# Patient Record
Sex: Female | Born: 1950 | Race: White | Hispanic: No | Marital: Married | Smoking: Former smoker
Health system: Southern US, Community
[De-identification: ages and names within clinical notes are randomized; demographics above are authoritative.]

## PROBLEM LIST (undated history)

## (undated) DIAGNOSIS — T8859XA Other complications of anesthesia, initial encounter: Secondary | ICD-10-CM

## (undated) DIAGNOSIS — R002 Palpitations: Secondary | ICD-10-CM

## (undated) DIAGNOSIS — F419 Anxiety disorder, unspecified: Secondary | ICD-10-CM

## (undated) DIAGNOSIS — M1990 Unspecified osteoarthritis, unspecified site: Secondary | ICD-10-CM

## (undated) DIAGNOSIS — Z720 Tobacco use: Secondary | ICD-10-CM

## (undated) DIAGNOSIS — J45909 Unspecified asthma, uncomplicated: Secondary | ICD-10-CM

## (undated) DIAGNOSIS — Z8489 Family history of other specified conditions: Secondary | ICD-10-CM

## (undated) DIAGNOSIS — I714 Abdominal aortic aneurysm, without rupture, unspecified: Secondary | ICD-10-CM

## (undated) DIAGNOSIS — Z9889 Other specified postprocedural states: Secondary | ICD-10-CM

## (undated) DIAGNOSIS — M545 Low back pain, unspecified: Secondary | ICD-10-CM

## (undated) DIAGNOSIS — K219 Gastro-esophageal reflux disease without esophagitis: Secondary | ICD-10-CM

## (undated) DIAGNOSIS — F329 Major depressive disorder, single episode, unspecified: Secondary | ICD-10-CM

## (undated) DIAGNOSIS — F32A Depression, unspecified: Secondary | ICD-10-CM

## (undated) DIAGNOSIS — I2510 Atherosclerotic heart disease of native coronary artery without angina pectoris: Secondary | ICD-10-CM

## (undated) DIAGNOSIS — R112 Nausea with vomiting, unspecified: Secondary | ICD-10-CM

## (undated) DIAGNOSIS — I214 Non-ST elevation (NSTEMI) myocardial infarction: Secondary | ICD-10-CM

## (undated) DIAGNOSIS — E785 Hyperlipidemia, unspecified: Secondary | ICD-10-CM

## (undated) DIAGNOSIS — N289 Disorder of kidney and ureter, unspecified: Secondary | ICD-10-CM

## (undated) DIAGNOSIS — J449 Chronic obstructive pulmonary disease, unspecified: Secondary | ICD-10-CM

## (undated) DIAGNOSIS — G8929 Other chronic pain: Secondary | ICD-10-CM

## (undated) DIAGNOSIS — I10 Essential (primary) hypertension: Secondary | ICD-10-CM

## (undated) DIAGNOSIS — T4145XA Adverse effect of unspecified anesthetic, initial encounter: Secondary | ICD-10-CM

## (undated) HISTORY — PX: STENT PLACE LEFT URETER (ARMC HX): HXRAD1254

## (undated) HISTORY — PX: CYST EXCISION: SHX5701

## (undated) HISTORY — PX: ABDOMINAL HYSTERECTOMY: SHX81

## (undated) HISTORY — PX: LAPAROSCOPIC NISSEN FUNDOPLICATION: SHX1932

## (undated) HISTORY — PX: CHOLECYSTECTOMY: SHX55

---

## 1999-02-16 ENCOUNTER — Other Ambulatory Visit: Admission: RE | Admit: 1999-02-16 | Discharge: 1999-02-16 | Payer: Self-pay | Admitting: Family Medicine

## 2003-02-14 ENCOUNTER — Emergency Department (HOSPITAL_COMMUNITY): Admission: EM | Admit: 2003-02-14 | Discharge: 2003-02-14 | Payer: Self-pay | Admitting: Emergency Medicine

## 2003-04-07 ENCOUNTER — Ambulatory Visit (HOSPITAL_COMMUNITY): Admission: RE | Admit: 2003-04-07 | Discharge: 2003-04-07 | Payer: Self-pay | Admitting: Gastroenterology

## 2003-07-30 ENCOUNTER — Ambulatory Visit (HOSPITAL_COMMUNITY): Admission: RE | Admit: 2003-07-30 | Discharge: 2003-07-30 | Payer: Self-pay | Admitting: Gastroenterology

## 2004-05-26 ENCOUNTER — Other Ambulatory Visit: Admission: RE | Admit: 2004-05-26 | Discharge: 2004-05-26 | Payer: Self-pay | Admitting: Family Medicine

## 2004-07-17 ENCOUNTER — Ambulatory Visit (HOSPITAL_COMMUNITY): Admission: RE | Admit: 2004-07-17 | Discharge: 2004-07-17 | Payer: Self-pay | Admitting: Gastroenterology

## 2004-11-15 ENCOUNTER — Inpatient Hospital Stay (HOSPITAL_COMMUNITY): Admission: RE | Admit: 2004-11-15 | Discharge: 2004-11-17 | Payer: Self-pay | Admitting: Surgery

## 2005-08-29 ENCOUNTER — Ambulatory Visit: Payer: Self-pay | Admitting: Cardiology

## 2008-04-18 ENCOUNTER — Ambulatory Visit: Payer: Self-pay | Admitting: Cardiology

## 2008-08-08 ENCOUNTER — Ambulatory Visit: Payer: Self-pay | Admitting: Cardiology

## 2010-10-27 NOTE — Op Note (Signed)
   NAME:  Olivia Wade, Olivia Wade                           ACCOUNT NO.:  [id number]   MEDICAL RECORD NO.:  [id number]                   PATIENT TYPE:  AMB   LOCATION:  ENDO                                 FACILITY:  [facility]   PHYSICIAN:  John C. Madilyn Fireman, M.D.                 DATE OF BIRTH:  [birth year]   DATE OF PROCEDURE:  04/07/2003  DATE OF DISCHARGE:                                 OPERATIVE REPORT   PROCEDURE:  Esophagogastroduodenoscopy with esophageal dilatation.   INDICATION FOR PROCEDURE:  Dysphagia for solids greater than liquids.   DESCRIPTION OF PROCEDURE:  The patient was placed in the left lateral  decubitus position and placed on the pulse monitor with continuous low-flow  oxygen delivered by nasal cannula.  She was sedated with 75 mcg IV fentanyl  and 6 mg IV Versed.  The Olympus video endoscope was advanced under direct  vision into the oropharynx and esophagus.  The esophagus was straight and of  normal caliber with the squamocolumnar line at 38 cm.  There was no visible  hiatal hernia, ring, stricture, or other abnormality of the GE junction.  The stomach was entered, and a small amount of liquid secretions were  suctioned from the fundus.  Retroflexed view of the cardia was unremarkable.  The fundus, body, antrum, and pylorus all appeared normal.  The duodenum was  entered, and both the bulb and second portion were well-inspected and  appeared to be within normal limits.  A Savary guidewire was passed through  the endoscope channel, and the scope was then withdrawn.  A single Savary  dilator of 17 mm was passed over the guidewire with mild resistance and no  blood seen on withdrawal.  The dilator was removed together with the wire,  and the patient was returned to the recovery room in stable condition.  She  tolerated the procedure well, and there were no immediate complications.   IMPRESSION:  Normal endoscopy, status post empiric dilatation due to  dysphagia.   PLAN:   Advance diet and observe response to dilatation.                                               John C. Madilyn Fireman, M.D.    JCH/MEDQ  D:  04/07/2003  T:  04/07/2003  Job:  [id number]   cc:   Magnus Sinning. Dimple Casey, M.D.  [address] [place]  [place] [address]  Fax: [phone]

## 2010-10-27 NOTE — Op Note (Signed)
Olivia Wade, Olivia Wade                 ACCOUNT NO.:  [id number]   MEDICAL RECORD NO.:  [id number]          PATIENT TYPE:  AMB   LOCATION:  ENDO                         FACILITY:  [facility]   PHYSICIAN:  John C. Madilyn Fireman, M.D.    DATE OF BIRTH:  1950-12-25   DATE OF PROCEDURE:  07/17/2004  DATE OF DISCHARGE:  07/17/2004                                 OPERATIVE REPORT   PROCEDURE:  Esophageal motility study.   ATTENDING:  Everardo All. Madilyn Fireman, M.D.   INDICATIONS FOR PROCEDURE:  Chronic reflux symptoms with some atypical  features and failing to respond to a proton pump inhibitor.   RESULTS:   I. LOWER ESOPHAGEAL SPHINCTER:  Resting pressure is 14.85 mmHg, which is  within normal limits.  Relaxation is 87%, which is within normal limits.   II. LOWER ESOPHAGEAL BODY STUDY:  Eleven of 12 administered wet swallows  resulted in peristaltic contractions of normal amplitude, velocity and  duration.  There was 1 of 12 nontransmural contractions.   IMPRESSION:  Normal esophageal motility study.   PLAN:  No contraindication to antireflux surgery.      JCH/MEDQ  D:  08/05/2004  T:  08/07/2004  Job:  [id number]

## 2010-10-27 NOTE — Op Note (Signed)
[name], [name]                 ACCOUNT NO.:  [id number]   MEDICAL RECORD NO.:  [id number]          PATIENT TYPE:  OBV   LOCATION:  1607                         FACILITY:  [facility]   PHYSICIAN:  Thornton Park. Daphine Deutscher, MD  DATE OF BIRTH:  Feb 21, 1951   DATE OF PROCEDURE:  11/15/2004  DATE OF DISCHARGE:                                 OPERATIVE REPORT   CCS NUMBER:  [id number]   PREOPERATIVE INDICATIONS:  Olivia Wade is a 60 year old lady from [place] with  asthma and documented gastroesophageal reflux disease. Most impressive is  her history of nocturnal reflux with food coming back in her throat.   PROCEDURE:  Laparoscopic Nissen fundoplication over a 50 lighted bougie.   SURGEON:  Luretha Murphy, MD.   ASSISTANT:  Lebron Conners, MD   ANESTHESIA:  General endotracheal.   DESCRIPTION OF PROCEDURE:  Olivia Wade was taken to room one on the  afternoon of November 15, 2004 and given general anesthesia. The abdomen was  prepped with Betadine and draped sterilely. I entered the abdomen through  the left upper quadrant with an Optiview 10/11 without difficulty. The  abdomen was insufflated. A 5 mm was placed  in the upper midline for the  articulating squiggly retractor. I used harmonic to take down some adhesions  in the right upper quadrant and then put in two trocars there and then one  to the left of the umbilicus for the camera.   I began the dissection along the lesser curvature and went down through the  pars flaccida. I opened up the region along the crus and did a  retroesophageal dissection and identified both crura. I put a stitch in to  approximate them although there was not much of a hiatal hernia present. I  then created a retroesophageal window and using the Penrose drain around the  esophagus used to aid the dissection. To took down short gastric vessels  with the harmonic scalpel. I then went around behind the esophagus, grasped  the stomach and brought it in the position for the  wrap.   Dr. Shireen Quan then passed a 50 lighted bougie and with that in place, I  secured a contiguous portion of the wrapped stomach from the left side to  suture all to the esophagogastric junction and to each other. These were  tied down, the first one extracorporeal and then two intracorporeal knots.  Clips were placed on the middle knot and top knot for identification  radiographically. They attempted to put an NG tube down before and were  unable to pass one. However, the lighted bougie went down easily without  difficulty.   I surveyed the abdominal contents and everything looked good. The wrap  appeared good and withdrew the retractors, decompressed the abdomen and  closed the wounds with staples. The patient seemed to tolerate the procedure  well and she was taken to the recovery room in satisfactory condition.       MBM/MEDQ  D:  11/15/2004  T:  11/16/2004  Job:  [id number]   cc:   Everardo All. Madilyn Fireman, M.D.  [phone]  [address]  [place]  [place] [address]  Fax: [phone]   Magnus Sinning. Dimple Casey, M.D.  [address] [place]  [place] [address]  Fax: [phone]

## 2010-10-27 NOTE — Op Note (Signed)
Olivia Wade, Olivia Wade                 ACCOUNT NO.:  [id number]   MEDICAL RECORD NO.:  [id number]          PATIENT TYPE:  AMB   LOCATION:  ENDO                         FACILITY:  [facility]   PHYSICIAN:  John C. Madilyn Fireman, M.D.    DATE OF BIRTH:  1951/02/13   DATE OF PROCEDURE:  07/17/2004  DATE OF DISCHARGE:  07/17/2004                                 OPERATIVE REPORT   PROCEDURE:  Esophageal 24-hour PH study.   INDICATIONS FOR PROCEDURE:  Gastroesophageal reflux with suboptimal response  to medical therapy.  Undergoing consideration for possible antireflux  surgery.   RESULTS:  Total Demeester reflux score was 39.8 with normal being less than  22.0.  Total reflux episodes were 181 with normal less than 50.  There was  fair symptoms correlation with 66.67% of heartburn episodes associated with  documented reflux, 100% of chest pain associated with reflux and 100% of  vomiting associated with reflux.   IMPRESSION:  Abnormal total reflux scores with fairly good symptoms  correlation.   PLAN:  No contraindications to anti-reflux surgery.  Will discuss this  option with the patient.      JCH/MEDQ  D:  08/05/2004  T:  08/07/2004  Job:  [id number]   cc:   Ernestina Penna, M.D.  [address] [place]  [place] [address]  Fax: [phone]

## 2011-04-27 ENCOUNTER — Other Ambulatory Visit: Payer: Self-pay | Admitting: Cardiovascular Disease

## 2011-04-27 DIAGNOSIS — M545 Low back pain: Secondary | ICD-10-CM

## 2011-05-01 ENCOUNTER — Ambulatory Visit
Admission: RE | Admit: 2011-05-01 | Discharge: 2011-05-01 | Disposition: A | Discharge status: Home / Self Care | Payer: BC Managed Care – PPO | Source: Ambulatory Visit | Attending: Cardiovascular Disease | Admitting: Cardiovascular Disease

## 2011-05-01 DIAGNOSIS — M545 Low back pain: Secondary | ICD-10-CM

## 2011-05-09 HISTORY — PX: OTHER SURGICAL HISTORY: SHX169

## 2011-05-11 ENCOUNTER — Other Ambulatory Visit (HOSPITAL_COMMUNITY): Payer: Self-pay | Admitting: Cardiovascular Disease

## 2011-05-11 DIAGNOSIS — N83209 Unspecified ovarian cyst, unspecified side: Secondary | ICD-10-CM

## 2011-05-11 DIAGNOSIS — R52 Pain, unspecified: Secondary | ICD-10-CM

## 2011-05-15 ENCOUNTER — Ambulatory Visit (HOSPITAL_COMMUNITY)
Admission: RE | Admit: 2011-05-15 | Discharge: 2011-05-15 | Disposition: A | Discharge status: Home / Self Care | Payer: BC Managed Care – PPO | Source: Ambulatory Visit | Attending: Cardiovascular Disease | Admitting: Cardiovascular Disease

## 2011-05-15 ENCOUNTER — Other Ambulatory Visit (HOSPITAL_COMMUNITY): Payer: Self-pay | Admitting: Cardiovascular Disease

## 2011-05-15 DIAGNOSIS — R52 Pain, unspecified: Secondary | ICD-10-CM

## 2011-05-15 DIAGNOSIS — N83209 Unspecified ovarian cyst, unspecified side: Secondary | ICD-10-CM

## 2011-05-15 DIAGNOSIS — R9389 Abnormal findings on diagnostic imaging of other specified body structures: Secondary | ICD-10-CM | POA: Insufficient documentation

## 2011-05-15 DIAGNOSIS — R109 Unspecified abdominal pain: Secondary | ICD-10-CM | POA: Insufficient documentation

## 2012-04-24 ENCOUNTER — Other Ambulatory Visit (HOSPITAL_COMMUNITY): Payer: Self-pay | Admitting: Cardiovascular Disease

## 2012-04-24 DIAGNOSIS — I714 Abdominal aortic aneurysm, without rupture: Secondary | ICD-10-CM

## 2012-04-24 DIAGNOSIS — I6529 Occlusion and stenosis of unspecified carotid artery: Secondary | ICD-10-CM

## 2012-05-19 ENCOUNTER — Ambulatory Visit (HOSPITAL_COMMUNITY)
Admission: RE | Admit: 2012-05-19 | Discharge: 2012-05-19 | Disposition: A | Discharge status: Home / Self Care | Payer: BC Managed Care – PPO | Source: Ambulatory Visit | Attending: Cardiovascular Disease | Admitting: Cardiovascular Disease

## 2012-05-19 DIAGNOSIS — I714 Abdominal aortic aneurysm, without rupture: Secondary | ICD-10-CM

## 2012-05-19 DIAGNOSIS — I6529 Occlusion and stenosis of unspecified carotid artery: Secondary | ICD-10-CM | POA: Insufficient documentation

## 2012-05-19 HISTORY — PX: OTHER SURGICAL HISTORY: SHX169

## 2012-05-19 NOTE — Progress Notes (Signed)
Carotid Doppler completed. ,Chauncy Lean

## 2012-05-19 NOTE — Progress Notes (Signed)
Aorta Duplex Completed. Bethania Schlotzhauer D  

## 2012-11-26 ENCOUNTER — Other Ambulatory Visit (HOSPITAL_COMMUNITY): Payer: Self-pay | Admitting: Cardiovascular Disease

## 2012-11-26 ENCOUNTER — Encounter: Payer: Self-pay | Admitting: Cardiovascular Disease

## 2012-11-26 DIAGNOSIS — I10 Essential (primary) hypertension: Secondary | ICD-10-CM

## 2012-11-26 DIAGNOSIS — R011 Cardiac murmur, unspecified: Secondary | ICD-10-CM

## 2012-11-27 ENCOUNTER — Other Ambulatory Visit (HOSPITAL_COMMUNITY): Payer: Self-pay | Admitting: Cardiovascular Disease

## 2012-11-27 DIAGNOSIS — R011 Cardiac murmur, unspecified: Secondary | ICD-10-CM

## 2012-11-27 DIAGNOSIS — I10 Essential (primary) hypertension: Secondary | ICD-10-CM

## 2012-12-09 HISTORY — PX: OTHER SURGICAL HISTORY: SHX169

## 2012-12-15 ENCOUNTER — Inpatient Hospital Stay (HOSPITAL_COMMUNITY): Admission: RE | Admit: 2012-12-15 | Payer: BC Managed Care – PPO | Source: Ambulatory Visit

## 2012-12-19 ENCOUNTER — Other Ambulatory Visit: Payer: Self-pay | Admitting: Cardiovascular Disease

## 2012-12-19 LAB — CBC WITH DIFFERENTIAL/PLATELET
Basophils Relative: 0 % (ref 0–1)
Eosinophils Absolute: 0.1 10*3/uL (ref 0.0–0.7)
Eosinophils Relative: 1 % (ref 0–5)
Lymphs Abs: 3.6 10*3/uL (ref 0.7–4.0)
MCH: 32.1 pg (ref 26.0–34.0)
MCHC: 35.3 g/dL (ref 30.0–36.0)
MCV: 90.8 fL (ref 78.0–100.0)
Monocytes Relative: 8 % (ref 3–12)
Platelets: 320 10*3/uL (ref 150–400)
RBC: 3.9 MIL/uL (ref 3.87–5.11)

## 2012-12-20 LAB — COMPREHENSIVE METABOLIC PANEL
BUN: 8 mg/dL (ref 6–23)
CO2: 26 mEq/L (ref 19–32)
Calcium: 9.1 mg/dL (ref 8.4–10.5)
Chloride: 93 mEq/L — ABNORMAL LOW (ref 96–112)
Creat: 0.97 mg/dL (ref 0.50–1.10)

## 2012-12-31 ENCOUNTER — Other Ambulatory Visit: Payer: Self-pay | Admitting: Cardiovascular Disease

## 2012-12-31 ENCOUNTER — Ambulatory Visit (HOSPITAL_COMMUNITY)
Admission: RE | Admit: 2012-12-31 | Discharge: 2012-12-31 | Disposition: A | Discharge status: Home / Self Care | Payer: BC Managed Care – PPO | Source: Ambulatory Visit | Attending: Cardiovascular Disease | Admitting: Cardiovascular Disease

## 2012-12-31 DIAGNOSIS — I10 Essential (primary) hypertension: Secondary | ICD-10-CM | POA: Insufficient documentation

## 2012-12-31 DIAGNOSIS — R011 Cardiac murmur, unspecified: Secondary | ICD-10-CM

## 2012-12-31 HISTORY — PX: DOPPLER ECHOCARDIOGRAPHY: SHX263

## 2012-12-31 NOTE — Progress Notes (Signed)
*  PRELIMINARY RESULTS* Echocardiogram 2D Echocardiogram has been performed.  Olivia Wade 12/31/2012, 3:13 PM

## 2013-01-01 LAB — COMPREHENSIVE METABOLIC PANEL
ALT: 11 U/L (ref 0–35)
Albumin: 4.3 g/dL (ref 3.5–5.2)
CO2: 26 mEq/L (ref 19–32)
Calcium: 9.6 mg/dL (ref 8.4–10.5)
Chloride: 98 mEq/L (ref 96–112)
Potassium: 4.7 mEq/L (ref 3.5–5.3)
Sodium: 131 mEq/L — ABNORMAL LOW (ref 135–145)
Total Protein: 7 g/dL (ref 6.0–8.3)

## 2013-01-14 ENCOUNTER — Other Ambulatory Visit: Payer: Self-pay

## 2013-01-14 MED ORDER — ATORVASTATIN CALCIUM 40 MG PO TABS: 40.0000 mg | ORAL_TABLET | Freq: Every day | ORAL | Status: DC

## 2013-01-14 NOTE — Telephone Encounter (Signed)
Rx was sent to pharmacy electronically. 

## 2013-01-15 ENCOUNTER — Other Ambulatory Visit: Payer: Self-pay | Admitting: Cardiovascular Disease

## 2013-01-15 LAB — BASIC METABOLIC PANEL
Calcium: 8.8 mg/dL (ref 8.4–10.5)
Glucose, Bld: 74 mg/dL (ref 70–99)
Sodium: 139 mEq/L (ref 135–145)

## 2013-04-01 ENCOUNTER — Other Ambulatory Visit: Payer: Self-pay | Admitting: *Deleted

## 2013-04-01 NOTE — Telephone Encounter (Signed)
Medication refill for tramadol 50mg  q8h prn refused with reasoning of prescriber not at practice, defer refills to PCP

## 2013-05-26 ENCOUNTER — Encounter: Payer: Self-pay | Admitting: Cardiovascular Disease

## 2013-05-26 ENCOUNTER — Ambulatory Visit (INDEPENDENT_AMBULATORY_CARE_PROVIDER_SITE_OTHER): Payer: BC Managed Care – PPO | Admitting: Cardiovascular Disease

## 2013-05-26 VITALS — BP 106/60 | HR 82 | Ht 64.0 in | Wt 144.3 lb

## 2013-05-26 DIAGNOSIS — I714 Abdominal aortic aneurysm, without rupture, unspecified: Secondary | ICD-10-CM

## 2013-05-26 DIAGNOSIS — F17200 Nicotine dependence, unspecified, uncomplicated: Secondary | ICD-10-CM

## 2013-05-26 DIAGNOSIS — I10 Essential (primary) hypertension: Secondary | ICD-10-CM

## 2013-05-26 DIAGNOSIS — M545 Low back pain, unspecified: Secondary | ICD-10-CM | POA: Insufficient documentation

## 2013-05-26 DIAGNOSIS — E785 Hyperlipidemia, unspecified: Secondary | ICD-10-CM | POA: Insufficient documentation

## 2013-05-26 DIAGNOSIS — K219 Gastro-esophageal reflux disease without esophagitis: Secondary | ICD-10-CM

## 2013-05-26 DIAGNOSIS — G8929 Other chronic pain: Secondary | ICD-10-CM

## 2013-05-26 DIAGNOSIS — J449 Chronic obstructive pulmonary disease, unspecified: Secondary | ICD-10-CM

## 2013-05-26 DIAGNOSIS — Z720 Tobacco use: Secondary | ICD-10-CM

## 2013-05-26 NOTE — Progress Notes (Signed)
Patient ID: Olivia Wade, female   DOB: 16-Aug-1950, 62 y.o.   MRN: [id number]     PATIENT PROFILE: Olivia Wade is a 62 year old female who presents to the office today to establish cardiology care with me. She is a former patient of Dr. [place] Wade who has since retired from his solo practice.   HPI: [name] is a long-standing history of significant tobacco use. She started smoking in 1977 and has smoked up to 3 packs per day for many years. Recently she is reduce this to one half pack per day. She has a history of chronic low back pain and has been evaluated by Dr. Gerlene Wade. She has also a documented asymptomatic abdominal aortic aneurysm and on last testing one year ago this measured 3.3x3.3 cm. Additional problems include COPD/asthma. She is status post laparoscopic Nissen fundoplication for severe reflux is also status post cholecystectomy and hysterectomy remotely. Review of Dr. Kandis Wade records indicates that in July 2014 she did have an echo Doppler study which showed mild LVH with normal systolic function. She did have grade 1 diastolic dysfunction. There was evidence for mild mitral regurgitation. A nuclear perfusion study was done several years ago which showed normal perfusion without scar or ischemia in 2012.  She presently is on lisinopril 10 mg and Toprol 25 mg for hypertension. She is on Lipitor 40 mg for hyperlipidemia. She continues to take Protonix 40 mg daily for GERD symptoms. There also is a history of anxiety and depression for which she takes when necessary Xanax as well as Celexa 20 mg daily. She does take albuterol inhaler. For her chronic back pain she takes Flexeril as well as tramadol.    Allergies  Allergen Reactions  . Penicillins Other (See Comments)    PATIENT REPORTS "MOTHER TOLD HER SHE HAD ALLERGY TO PCN"  . Morphine And Related Rash    Current Outpatient Prescriptions  Medication Sig Dispense Refill  . albuterol (PROVENTIL) (2.5 MG/3ML) 0.083%  nebulizer solution Take 2.5 mg by nebulization every 6 (six) hours as needed for wheezing or shortness of breath.      . ALBUTEROL SULFATE HFA IN Inhale 90 mcg into the lungs 4 (four) times daily as needed.      . ALPRAZolam (XANAX) 0.5 MG tablet Take 0.5 mg by mouth 3 (three) times daily as needed for anxiety.      Marland Kitchen atorvastatin (LIPITOR) 40 MG tablet Take 1 tablet (40 mg total) by mouth daily.  30 tablet  11  . citalopram (CELEXA) 20 MG tablet Take 20 mg by mouth daily.      . cyclobenzaprine (FLEXERIL) 10 MG tablet Take 10 mg by mouth 3 (three) times daily as needed for muscle spasms.      Marland Kitchen lisinopril (PRINIVIL,ZESTRIL) 10 MG tablet Take 10 mg by mouth daily.      . metoprolol succinate (TOPROL-XL) 25 MG 24 hr tablet Take 25 mg by mouth daily.      . montelukast (SINGULAIR) 10 MG tablet Take 10 mg by mouth at bedtime.      . pantoprazole (PROTONIX) 40 MG tablet Take 40 mg by mouth daily.      . traMADol (ULTRAM) 50 MG tablet Take 50 mg by mouth every 6 (six) hours as needed.       No current facility-administered medications for this visit.   Social history is notable in that she is married, has 3 children, 11 grandchildren, and recently has a great-grandchild. She continues to smoke cigarettes and  has been smoking since 1977. She does not exercise. There is no alcohol use.  Family History  Problem Relation Age of Onset  . Heart attack Father     ROS is negative for fever chills or night sweats. She denies any significant change in weight. There is no change in vision. She denies skin rash. He denies change in hearing. She is unaware lymphadenopathy. She does have a cough intermittently wheezes. She does note shortness of breath with activity. She denies chest pressure. She denies presyncope or syncope. She is unaware of palpitations. She still has occasional GERD symptoms and is status post Nissen fundoplication surgery. She does have an asymptomatic abdominal aortic aneurysm. She does not  exercise enough to be aware of any claudication symptoms. She denies myalgias. She has chronic low back pain and back discomfort without significant radicular distribution. She denies significant edema. She denies tremor. There is no known diabetes. She denies cold or heat intolerance. She does have issues with anxiety and depression. She does admit to some difficulty with balance making her walking difficult. Other comprehensive 14 point system review is negative.  PE BP 106/60  Pulse 82  Ht 5\' 4"  (1.626 m)  Wt 144 lb 4.8 oz (65.454 kg)  BMI 24.76 kg/m2 General: Alert, oriented, no distress. She appears significantly older than her stated age. Skin: normal turgor, no rashes HEENT: Normocephalic, atraumatic. Pupils round and reactive; sclera anicteric; Fundi mild arteriolar narrowing. No hemorrhages or exudates Nose without nasal septal hypertrophy Mouth/Parynx benign; Mallinpatti scale 3 Neck: No JVD, no carotid bruits Lungs: Diffusely decreased breath sounds; no wheezing or rales Chest wall: without tenderness to palpitation Heart: RRR, s1 s2 normal 1/6 systolic murmur. No S3 gallop. Abdomen: soft, nontender; no hepatosplenomehaly, BS+; abdominal aorta nontender and not dilated by palpation. Back: no CVA tenderness Pulses 2+ Extremities: no clubbinbg cyanosis or edema, Homan's sign negative  Neurologic: grossly nonfocal; Cranial nerves grossly wnl Psychologic: Normal mood and affect    ECG: Sinus rhythm 82 beats per minute. QTc interval 450 ms. No significant ST-T changes  LABS:  BMET    Component Value Date/Time   NA 139 01/15/2013 0759   K 4.8 01/15/2013 0759   CL 106 01/15/2013 0759   CO2 27 01/15/2013 0759   GLUCOSE 74 01/15/2013 0759   BUN 8 01/15/2013 0759   CREATININE 0.81 01/15/2013 0759   CALCIUM 8.8 01/15/2013 0759     Hepatic Function Panel     Component Value Date/Time   PROT 7.0 12/31/2012 1617   ALBUMIN 4.3 12/31/2012 1617   AST 16 12/31/2012 1617   ALT 11 12/31/2012  1617   ALKPHOS 76 12/31/2012 1617   BILITOT 0.4 12/31/2012 1617     CBC    Component Value Date/Time   WBC 7.6 12/19/2012 1454   RBC 3.90 12/19/2012 1454   HGB 12.5 12/19/2012 1454   HCT 35.4* 12/19/2012 1454   PLT 320 12/19/2012 1454   MCV 90.8 12/19/2012 1454   MCH 32.1 12/19/2012 1454   MCHC 35.3 12/19/2012 1454   RDW 12.9 12/19/2012 1454   LYMPHSABS 3.6 12/19/2012 1454   MONOABS 0.6 12/19/2012 1454   EOSABS 0.1 12/19/2012 1454   BASOSABS 0.0 12/19/2012 1454     BNP No results found for this basename: probnp    Lipid Panel  No results found for this basename: chol, trig, hdl, cholhdl, vldl, ldlcalc     RADIOLOGY: No results found.   ASSESSMENT AND PLAN: My impression is that Ms.  Wade is a 62 year old female who is a 37 year history of significant tobacco abuse up to 3 packs per day. She does have COPD/asthma. She believes her last chest x-ray was over several years ago. I am recommending that we obtain a PA and lateral chest x-ray Her significant tobacco history. She does have an abdominal aortic aneurysm and was scheduled to have a potential 1 year followup study done. This had not yet been scheduled and I will schedule this to be done to further evaluate her abdominal aortic aneurysm. I had long discussion with her concerning the importance of smoking cessation. I reviewed the data from her most recent echo Doppler study in July 2014 which revealed mild left ventricular hypertrophy with normal systolic but with grade 1 diastolic dysfunction. She presently denies chest pain. A nuclear study 2 years ago revealed a fairly normal perfusion. I did review his laboratory that was recently done in July and August. I will see her back in the office in 6 month followup evaluation and further recommendations will be made at that time.   Lennette Bihari, MD, [facility] 05/26/2013 7:03 PM

## 2013-05-26 NOTE — Patient Instructions (Signed)
Your physician has requested that you have an abdominal aorta duplex. During this test, an ultrasound is used to evaluate the aorta. Allow 30 minutes for this exam. Do not eat after midnight the day before and avoid carbonated beverages this will be scheduled at [facility].  A chest x-ray takes a picture of the organs and structures inside the chest, including the heart, lungs, and blood vessels. This test can show several things, including, whether the heart is enlarges; whether fluid is building up in the lungs; and whether pacemaker / defibrillator leads are still in place. This will also be done @ [facility].  Your physician recommends that you schedule a follow-up appointment in: 6 MONTHS.

## 2013-05-27 ENCOUNTER — Other Ambulatory Visit: Payer: Self-pay | Admitting: *Deleted

## 2013-05-27 DIAGNOSIS — I714 Abdominal aortic aneurysm, without rupture, unspecified: Secondary | ICD-10-CM

## 2013-05-27 DIAGNOSIS — F17200 Nicotine dependence, unspecified, uncomplicated: Secondary | ICD-10-CM

## 2013-05-27 DIAGNOSIS — J449 Chronic obstructive pulmonary disease, unspecified: Secondary | ICD-10-CM

## 2013-05-28 ENCOUNTER — Encounter: Payer: Self-pay | Admitting: Cardiovascular Disease

## 2013-05-29 ENCOUNTER — Ambulatory Visit (HOSPITAL_COMMUNITY)
Admission: RE | Admit: 2013-05-29 | Discharge: 2013-05-29 | Disposition: A | Discharge status: Home / Self Care | Payer: BC Managed Care – PPO | Source: Ambulatory Visit | Attending: Cardiovascular Disease | Admitting: Cardiovascular Disease

## 2013-05-29 DIAGNOSIS — J4489 Other specified chronic obstructive pulmonary disease: Secondary | ICD-10-CM | POA: Insufficient documentation

## 2013-05-29 DIAGNOSIS — J45909 Unspecified asthma, uncomplicated: Secondary | ICD-10-CM | POA: Insufficient documentation

## 2013-05-29 DIAGNOSIS — I714 Abdominal aortic aneurysm, without rupture, unspecified: Secondary | ICD-10-CM | POA: Insufficient documentation

## 2013-05-29 DIAGNOSIS — J449 Chronic obstructive pulmonary disease, unspecified: Secondary | ICD-10-CM | POA: Insufficient documentation

## 2013-05-31 ENCOUNTER — Encounter: Payer: Self-pay | Admitting: *Deleted

## 2013-08-13 ENCOUNTER — Other Ambulatory Visit: Payer: Self-pay | Admitting: *Deleted

## 2013-08-13 MED ORDER — LISINOPRIL 10 MG PO TABS: 10.0000 mg | ORAL_TABLET | Freq: Every day | ORAL | Status: DC

## 2013-08-13 NOTE — Telephone Encounter (Signed)
Rx was sent to pharmacy electronically. 

## 2013-09-01 ENCOUNTER — Other Ambulatory Visit: Payer: Self-pay

## 2013-09-01 NOTE — Telephone Encounter (Signed)
Rx was sent to pharmacy electronically. 

## 2013-09-04 ENCOUNTER — Other Ambulatory Visit: Payer: Self-pay | Admitting: *Deleted

## 2013-09-08 ENCOUNTER — Encounter (HOSPITAL_COMMUNITY)
Admission: EM | Disposition: A | Discharge status: Home / Self Care | Payer: Self-pay | Source: Home / Self Care | Attending: Cardiovascular Disease

## 2013-09-08 ENCOUNTER — Ambulatory Visit (HOSPITAL_COMMUNITY): Admit: 2013-09-08 | Payer: Self-pay | Admitting: Cardiovascular Disease

## 2013-09-08 ENCOUNTER — Other Ambulatory Visit: Payer: Self-pay | Admitting: *Deleted

## 2013-09-08 ENCOUNTER — Encounter (HOSPITAL_COMMUNITY): Payer: Self-pay | Admitting: Emergency Medicine

## 2013-09-08 ENCOUNTER — Emergency Department (HOSPITAL_COMMUNITY): Payer: BC Managed Care – PPO

## 2013-09-08 ENCOUNTER — Encounter (HOSPITAL_COMMUNITY): Payer: BC Managed Care – PPO

## 2013-09-08 ENCOUNTER — Inpatient Hospital Stay (HOSPITAL_COMMUNITY)
Admission: EM | Admit: 2013-09-08 | Discharge: 2013-09-11 | DRG: 247 | Disposition: A | Discharge status: Home / Self Care | Payer: BC Managed Care – PPO | Attending: Cardiovascular Disease | Admitting: Cardiovascular Disease

## 2013-09-08 DIAGNOSIS — G8929 Other chronic pain: Secondary | ICD-10-CM

## 2013-09-08 DIAGNOSIS — E785 Hyperlipidemia, unspecified: Secondary | ICD-10-CM | POA: Diagnosis present

## 2013-09-08 DIAGNOSIS — I2510 Atherosclerotic heart disease of native coronary artery without angina pectoris: Secondary | ICD-10-CM

## 2013-09-08 DIAGNOSIS — I214 Non-ST elevation (NSTEMI) myocardial infarction: Principal | ICD-10-CM | POA: Diagnosis present

## 2013-09-08 DIAGNOSIS — I249 Acute ischemic heart disease, unspecified: Secondary | ICD-10-CM | POA: Diagnosis present

## 2013-09-08 DIAGNOSIS — Z880 Allergy status to penicillin: Secondary | ICD-10-CM

## 2013-09-08 DIAGNOSIS — K219 Gastro-esophageal reflux disease without esophagitis: Secondary | ICD-10-CM

## 2013-09-08 DIAGNOSIS — F17200 Nicotine dependence, unspecified, uncomplicated: Secondary | ICD-10-CM

## 2013-09-08 DIAGNOSIS — M545 Low back pain, unspecified: Secondary | ICD-10-CM

## 2013-09-08 DIAGNOSIS — J4489 Other specified chronic obstructive pulmonary disease: Secondary | ICD-10-CM | POA: Diagnosis present

## 2013-09-08 DIAGNOSIS — Z8249 Family history of ischemic heart disease and other diseases of the circulatory system: Secondary | ICD-10-CM

## 2013-09-08 DIAGNOSIS — Z885 Allergy status to narcotic agent status: Secondary | ICD-10-CM

## 2013-09-08 DIAGNOSIS — Z720 Tobacco use: Secondary | ICD-10-CM

## 2013-09-08 DIAGNOSIS — J449 Chronic obstructive pulmonary disease, unspecified: Secondary | ICD-10-CM | POA: Diagnosis present

## 2013-09-08 DIAGNOSIS — I10 Essential (primary) hypertension: Secondary | ICD-10-CM | POA: Diagnosis present

## 2013-09-08 DIAGNOSIS — I219 Acute myocardial infarction, unspecified: Secondary | ICD-10-CM

## 2013-09-08 DIAGNOSIS — I714 Abdominal aortic aneurysm, without rupture, unspecified: Secondary | ICD-10-CM | POA: Diagnosis present

## 2013-09-08 DIAGNOSIS — Z955 Presence of coronary angioplasty implant and graft: Secondary | ICD-10-CM

## 2013-09-08 DIAGNOSIS — I200 Unstable angina: Secondary | ICD-10-CM | POA: Diagnosis present

## 2013-09-08 HISTORY — DX: Tobacco use: Z72.0

## 2013-09-08 HISTORY — DX: Hyperlipidemia, unspecified: E78.5

## 2013-09-08 HISTORY — DX: Atherosclerotic heart disease of native coronary artery without angina pectoris: I25.10

## 2013-09-08 HISTORY — DX: Abdominal aortic aneurysm, without rupture: I71.4

## 2013-09-08 HISTORY — DX: Other chronic pain: G89.29

## 2013-09-08 HISTORY — DX: Low back pain, unspecified: M54.50

## 2013-09-08 HISTORY — PX: LEFT HEART CATHETERIZATION WITH CORONARY ANGIOGRAM: SHX5451

## 2013-09-08 HISTORY — DX: Non-ST elevation (NSTEMI) myocardial infarction: I21.4

## 2013-09-08 HISTORY — DX: Chronic obstructive pulmonary disease, unspecified: J44.9

## 2013-09-08 HISTORY — DX: Essential (primary) hypertension: I10

## 2013-09-08 HISTORY — DX: Unspecified asthma, uncomplicated: J45.909

## 2013-09-08 HISTORY — PX: CORONARY STENT PLACEMENT: SHX1402

## 2013-09-08 HISTORY — DX: Gastro-esophageal reflux disease without esophagitis: K21.9

## 2013-09-08 HISTORY — DX: Abdominal aortic aneurysm, without rupture, unspecified: I71.40

## 2013-09-08 HISTORY — DX: Low back pain: M54.5

## 2013-09-08 LAB — APTT: aPTT: 31 seconds (ref 24–37)

## 2013-09-08 LAB — TSH: TSH: 0.598 u[IU]/mL (ref 0.350–4.500)

## 2013-09-08 LAB — BASIC METABOLIC PANEL
BUN: 9 mg/dL (ref 6–23)
CHLORIDE: 103 meq/L (ref 96–112)
CO2: 22 mEq/L (ref 19–32)
Calcium: 9 mg/dL (ref 8.4–10.5)
Creatinine, Ser: 0.82 mg/dL (ref 0.50–1.10)
GFR calc non Af Amer: 76 mL/min — ABNORMAL LOW (ref >90)
GFR, EST AFRICAN AMERICAN: 88 mL/min — AB (ref >90)
Glucose, Bld: 98 mg/dL (ref 70–99)
POTASSIUM: 3.8 meq/L (ref 3.7–5.3)
SODIUM: 139 meq/L (ref 137–147)

## 2013-09-08 LAB — POCT ACTIVATED CLOTTING TIME
ACTIVATED CLOTTING TIME: 149 s
Activated Clotting Time: 597 seconds

## 2013-09-08 LAB — CBC WITH DIFFERENTIAL/PLATELET
BASOS PCT: 1 % (ref 0–1)
Basophils Absolute: 0 10*3/uL (ref 0.0–0.1)
Eosinophils Absolute: 0.1 10*3/uL (ref 0.0–0.7)
Eosinophils Relative: 1 % (ref 0–5)
HCT: 37.9 % (ref 36.0–46.0)
Hemoglobin: 13.1 g/dL (ref 12.0–15.0)
Lymphocytes Relative: 37 % (ref 12–46)
Lymphs Abs: 2.7 10*3/uL (ref 0.7–4.0)
MCH: 33.4 pg (ref 26.0–34.0)
MCHC: 34.6 g/dL (ref 30.0–36.0)
MCV: 96.7 fL (ref 78.0–100.0)
Monocytes Absolute: 0.4 10*3/uL (ref 0.1–1.0)
Monocytes Relative: 6 % (ref 3–12)
NEUTROS ABS: 4.1 10*3/uL (ref 1.7–7.7)
NEUTROS PCT: 55 % (ref 43–77)
Platelets: 254 10*3/uL (ref 150–400)
RBC: 3.92 MIL/uL (ref 3.87–5.11)
RDW: 12.9 % (ref 11.5–15.5)
WBC: 7.3 10*3/uL (ref 4.0–10.5)

## 2013-09-08 LAB — TROPONIN I
Troponin I: 0.71 ng/mL
Troponin I: 4.78 ng/mL (ref <0.30)

## 2013-09-08 LAB — I-STAT TROPONIN, ED: Troponin i, poc: 0.3 ng/mL (ref 0.00–0.08)

## 2013-09-08 LAB — PROTIME-INR
INR: 0.98 (ref 0.00–1.49)
Prothrombin Time: 12.8 s (ref 11.6–15.2)

## 2013-09-08 LAB — PRO B NATRIURETIC PEPTIDE: PRO B NATRI PEPTIDE: 61.8 pg/mL (ref 0–125)

## 2013-09-08 SURGERY — LEFT HEART CATHETERIZATION WITH CORONARY ANGIOGRAM
Anesthesia: LOCAL

## 2013-09-08 MED ORDER — MIDAZOLAM HCL 2 MG/2ML IJ SOLN
INTRAMUSCULAR | Status: AC
  Filled 2013-09-08: qty 2

## 2013-09-08 MED ORDER — PANTOPRAZOLE SODIUM 40 MG PO TBEC
40.0000 mg | DELAYED_RELEASE_TABLET | Freq: Every day | ORAL | Status: DC
  Administered 2013-09-09 – 2013-09-11 (×3): 40 mg via ORAL
  Filled 2013-09-08 (×3): qty 1

## 2013-09-08 MED ORDER — NITROGLYCERIN IN D5W 200-5 MCG/ML-% IV SOLN: 2.0000 ug/min | INTRAVENOUS | Status: DC

## 2013-09-08 MED ORDER — FENTANYL CITRATE 0.05 MG/ML IJ SOLN
INTRAMUSCULAR | Status: AC
  Filled 2013-09-08: qty 2

## 2013-09-08 MED ORDER — HEPARIN (PORCINE) IN NACL 100-0.45 UNIT/ML-% IJ SOLN
800.0000 [IU]/h | INTRAMUSCULAR | Status: DC
  Administered 2013-09-09: 800 [IU]/h via INTRAVENOUS
  Filled 2013-09-08: qty 250

## 2013-09-08 MED ORDER — TICAGRELOR 90 MG PO TABS
ORAL_TABLET | ORAL | Status: AC
  Filled 2013-09-08: qty 1

## 2013-09-08 MED ORDER — ATORVASTATIN CALCIUM 40 MG PO TABS: 40.0000 mg | ORAL_TABLET | Freq: Every day | ORAL | Status: DC

## 2013-09-08 MED ORDER — CITALOPRAM HYDROBROMIDE 20 MG PO TABS
20.0000 mg | ORAL_TABLET | Freq: Every day | ORAL | Status: DC
  Administered 2013-09-09 – 2013-09-11 (×3): 20 mg via ORAL
  Filled 2013-09-08 (×3): qty 1

## 2013-09-08 MED ORDER — HEPARIN (PORCINE) IN NACL 100-0.45 UNIT/ML-% IJ SOLN
800.0000 [IU]/h | INTRAMUSCULAR | Status: DC
  Administered 2013-09-08: 800 [IU]/h via INTRAVENOUS
  Filled 2013-09-08: qty 250

## 2013-09-08 MED ORDER — ATROPINE SULFATE 0.1 MG/ML IJ SOLN
INTRAMUSCULAR | Status: AC
  Filled 2013-09-08: qty 10

## 2013-09-08 MED ORDER — ALPRAZOLAM 0.5 MG PO TABS
0.5000 mg | ORAL_TABLET | Freq: Three times a day (TID) | ORAL | Status: DC | PRN
  Administered 2013-09-08 – 2013-09-10 (×5): 0.5 mg via ORAL
  Filled 2013-09-08 (×5): qty 1

## 2013-09-08 MED ORDER — ATORVASTATIN CALCIUM 80 MG PO TABS
80.0000 mg | ORAL_TABLET | Freq: Every day | ORAL | Status: DC
  Administered 2013-09-09 – 2013-09-10 (×2): 80 mg via ORAL
  Filled 2013-09-08 (×3): qty 1

## 2013-09-08 MED ORDER — TICAGRELOR 90 MG PO TABS
90.0000 mg | ORAL_TABLET | Freq: Two times a day (BID) | ORAL | Status: DC
  Administered 2013-09-08 – 2013-09-10 (×4): 90 mg via ORAL
  Filled 2013-09-08 (×5): qty 1

## 2013-09-08 MED ORDER — HEPARIN (PORCINE) IN NACL 2-0.9 UNIT/ML-% IJ SOLN
INTRAMUSCULAR | Status: AC
  Filled 2013-09-08: qty 1000

## 2013-09-08 MED ORDER — ASPIRIN 81 MG PO TABS: 81.0000 mg | ORAL_TABLET | Freq: Every day | ORAL | Status: DC

## 2013-09-08 MED ORDER — MONTELUKAST SODIUM 10 MG PO TABS
10.0000 mg | ORAL_TABLET | Freq: Every day | ORAL | Status: DC
  Administered 2013-09-08 – 2013-09-10 (×3): 10 mg via ORAL
  Filled 2013-09-08 (×4): qty 1

## 2013-09-08 MED ORDER — LIDOCAINE HCL (PF) 1 % IJ SOLN
INTRAMUSCULAR | Status: AC
  Filled 2013-09-08: qty 30

## 2013-09-08 MED ORDER — METOPROLOL SUCCINATE ER 25 MG PO TB24
25.0000 mg | ORAL_TABLET | Freq: Every day | ORAL | Status: DC
  Administered 2013-09-09 – 2013-09-11 (×3): 25 mg via ORAL
  Filled 2013-09-08 (×3): qty 1

## 2013-09-08 MED ORDER — ASPIRIN EC 81 MG PO TBEC
81.0000 mg | DELAYED_RELEASE_TABLET | Freq: Every day | ORAL | Status: DC
  Administered 2013-09-10: 81 mg via ORAL
  Filled 2013-09-08: qty 1

## 2013-09-08 MED ORDER — BIVALIRUDIN 250 MG IV SOLR
INTRAVENOUS | Status: AC
  Filled 2013-09-08: qty 250

## 2013-09-08 MED ORDER — NITROGLYCERIN 0.2 MG/ML ON CALL CATH LAB
INTRAVENOUS | Status: AC
  Filled 2013-09-08: qty 1

## 2013-09-08 MED ORDER — LISINOPRIL 10 MG PO TABS
10.0000 mg | ORAL_TABLET | Freq: Every day | ORAL | Status: DC
  Administered 2013-09-09 – 2013-09-11 (×3): 10 mg via ORAL
  Filled 2013-09-08 (×3): qty 1

## 2013-09-08 MED ORDER — HEPARIN BOLUS VIA INFUSION
4000.0000 [IU] | Freq: Once | INTRAVENOUS | Status: AC
  Administered 2013-09-08: 4000 [IU] via INTRAVENOUS
  Filled 2013-09-08: qty 4000

## 2013-09-08 MED ORDER — SODIUM CHLORIDE 0.9 % IJ SOLN
3.0000 mL | Freq: Two times a day (BID) | INTRAMUSCULAR | Status: DC
  Administered 2013-09-08 – 2013-09-10 (×4): 3 mL via INTRAVENOUS

## 2013-09-08 MED ORDER — SODIUM CHLORIDE 0.9 % IV SOLN
INTRAVENOUS | Status: DC
  Administered 2013-09-08: 19:00:00 via INTRAVENOUS

## 2013-09-08 MED ORDER — ALBUTEROL SULFATE HFA 108 (90 BASE) MCG/ACT IN AERS: 1.0000 | INHALATION_SPRAY | Freq: Four times a day (QID) | RESPIRATORY_TRACT | Status: DC | PRN

## 2013-09-08 MED ORDER — CYCLOBENZAPRINE HCL 10 MG PO TABS
10.0000 mg | ORAL_TABLET | Freq: Three times a day (TID) | ORAL | Status: DC | PRN
  Administered 2013-09-09 – 2013-09-11 (×3): 10 mg via ORAL
  Filled 2013-09-08 (×3): qty 1

## 2013-09-08 MED ORDER — ASPIRIN 81 MG PO CHEW
81.0000 mg | CHEWABLE_TABLET | ORAL | Status: AC
  Administered 2013-09-09: 81 mg via ORAL
  Filled 2013-09-08: qty 1

## 2013-09-08 MED ORDER — SODIUM CHLORIDE 0.9 % IV SOLN
250.0000 mL | INTRAVENOUS | Status: DC | PRN
  Administered 2013-09-10: 250 mL via INTRAVENOUS

## 2013-09-08 MED ORDER — ALBUTEROL SULFATE (2.5 MG/3ML) 0.083% IN NEBU
2.5000 mg | INHALATION_SOLUTION | Freq: Four times a day (QID) | RESPIRATORY_TRACT | Status: DC | PRN
  Administered 2013-09-09: 2.5 mg via RESPIRATORY_TRACT
  Filled 2013-09-08: qty 3

## 2013-09-08 MED ORDER — SODIUM CHLORIDE 0.9 % IJ SOLN: 3.0000 mL | INTRAMUSCULAR | Status: DC | PRN

## 2013-09-08 MED ORDER — NITROGLYCERIN IN D5W 200-5 MCG/ML-% IV SOLN
5.0000 ug/min | INTRAVENOUS | Status: DC
  Administered 2013-09-08: 5 ug/min via INTRAVENOUS
  Filled 2013-09-08: qty 250

## 2013-09-08 MED ORDER — TRAMADOL HCL 50 MG PO TABS
50.0000 mg | ORAL_TABLET | Freq: Four times a day (QID) | ORAL | Status: DC | PRN
  Administered 2013-09-08 – 2013-09-10 (×4): 50 mg via ORAL
  Filled 2013-09-08 (×4): qty 1

## 2013-09-08 NOTE — Progress Notes (Signed)
ACT = 149, right femoral artery discontinued. Manual pressure held for 20 min, site soft with no hematoma. pt appeared very anxious prior to pulling sheath, xanax 0.5mg  given. Bp during removal very soft, otherwise VSS. Pressure dressing applied. Pt remains on bedrest. Post cath instructions given to pt, pt verbalized understanding. Will cont to monitor.

## 2013-09-08 NOTE — ED Notes (Signed)
Pt undressed, in gown, on monitor, continuous pulse oximetry and blood pressure cuff 

## 2013-09-08 NOTE — ED Notes (Signed)
Family at bedside. 

## 2013-09-08 NOTE — ED Notes (Signed)
Results of troponin given to Dr. Bernette Mayers

## 2013-09-08 NOTE — ED Provider Notes (Signed)
CSN: [id number]     Arrival date & time 09/08/13  1045 History   First MD Initiated Contact with Patient 09/08/13 1046     No chief complaint on file.    (Consider location/radiation/quality/duration/timing/severity/associated sxs/prior Treatment) HPI Pt with history of asymptomatic AAA but no known CAD reports she woke up this morning with burning, pressure midsternal chest pain, associated with SOB and nausea. EMS was called. Initial EKG concerning for STEMI but transmitted with incorrect demographic information. That EMS unit subsequently had vehicle breakdown and so the patient was transferred to a second EMS unit who did a second EKG which was improved. This was after ASA and NTG with some improvement in pain from 10/10 to 6/10.   No past medical history on file. No past surgical history on file. Family History  Problem Relation Age of Onset  . Heart attack Father    History  Substance Use Topics  . Smoking status: Current Every Day Smoker -- 0.50 packs/day for 37 years    Types: Cigarettes  . Smokeless tobacco: Not on file  . Alcohol Use: Not on file   OB History   Grav Para Term Preterm Abortions TAB SAB Ect Mult Living                 Review of Systems All other systems reviewed and are negative except as noted in HPI.     Allergies  Penicillins and Morphine and related  Home Medications   Current Outpatient Rx  Name  Route  Sig  Dispense  Refill  . albuterol (PROVENTIL) (2.5 MG/3ML) 0.083% nebulizer solution   Nebulization   Take 2.5 mg by nebulization every 6 (six) hours as needed for wheezing or shortness of breath.         . ALBUTEROL SULFATE HFA IN   Inhalation   Inhale 90 mcg into the lungs 4 (four) times daily as needed.         . ALPRAZolam (XANAX) 0.5 MG tablet   Oral   Take 0.5 mg by mouth 3 (three) times daily as needed for anxiety.         Marland Kitchen. atorvastatin (LIPITOR) 40 MG tablet   Oral   Take 1 tablet (40 mg total) by mouth daily.   30  tablet   11   . citalopram (CELEXA) 20 MG tablet   Oral   Take 20 mg by mouth daily.         . cyclobenzaprine (FLEXERIL) 10 MG tablet   Oral   Take 10 mg by mouth 3 (three) times daily as needed for muscle spasms.         Marland Kitchen. lisinopril (PRINIVIL,ZESTRIL) 10 MG tablet   Oral   Take 1 tablet (10 mg total) by mouth daily.   30 tablet   9   . metoprolol succinate (TOPROL-XL) 25 MG 24 hr tablet   Oral   Take 25 mg by mouth daily.         . montelukast (SINGULAIR) 10 MG tablet   Oral   Take 10 mg by mouth at bedtime.         . pantoprazole (PROTONIX) 40 MG tablet   Oral   Take 40 mg by mouth daily.         . traMADol (ULTRAM) 50 MG tablet   Oral   Take 50 mg by mouth every 6 (six) hours as needed.          There were no vitals taken  for this visit. Physical Exam  Nursing note and vitals reviewed. Constitutional: She is oriented to person, place, and time. She appears well-developed and well-nourished.  HENT:  Head: Normocephalic and atraumatic.  Eyes: EOM are normal. Pupils are equal, round, and reactive to light.  Neck: Normal range of motion. Neck supple.  Cardiovascular: Normal rate, normal heart sounds and intact distal pulses.   Pulmonary/Chest: Effort normal and breath sounds normal.  Abdominal: Bowel sounds are normal. She exhibits no distension. There is no tenderness.  Musculoskeletal: Normal range of motion. She exhibits no edema and no tenderness.  Neurological: She is alert and oriented to person, place, and time. She has normal strength. No cranial nerve deficit or sensory deficit.  Skin: Skin is warm and dry. No rash noted.  Psychiatric: She has a normal mood and affect.    ED Course  Procedures (including critical care time)  CRITICAL CARE Performed by: Pollyann Savoy. Total critical care time: 40 Critical care time was exclusive of separately billable procedures and treating other patients. Critical care was necessary to treat or  prevent imminent or life-threatening deterioration. Critical care was time spent personally by me on the following activities: development of treatment plan with patient and/or surrogate as well as nursing, discussions with consultants, evaluation of patient's response to treatment, examination of patient, obtaining history from patient or surrogate, ordering and performing treatments and interventions, ordering and review of laboratory studies, ordering and review of radiographic studies, pulse oximetry and re-evaluation of patient's condition.  Labs Review Labs Reviewed  BASIC METABOLIC PANEL - Abnormal; Notable for the following:    GFR calc non Af Amer 76 (*)    GFR calc Af Amer 88 (*)    All other components within normal limits  TROPONIN I - Abnormal; Notable for the following:    Troponin I 0.71 (*)    All other components within normal limits  I-STAT TROPOININ, ED - Abnormal; Notable for the following:    Troponin i, poc 0.30 (*)    All other components within normal limits  CBC WITH DIFFERENTIAL  PRO B NATRIURETIC PEPTIDE  PROTIME-INR  APTT  HEPARIN LEVEL (UNFRACTIONATED)   Imaging Review Dg Chest Port 1 View  09/08/2013   CLINICAL DATA:  Chest pain. Shortness breath. Cough. Asthma. Smoker. High blood pressure.  EXAM: PORTABLE CHEST - 1 VIEW  COMPARISON:  05/29/2013 and 09/03/2011.  The  FINDINGS: Heart size top-normal to minimally enlarged. Technique may contribute to this appearance.  Central pulmonary vascular prominence without frank pulmonary edema.  No segmental infiltrate or pneumothorax.  No plain film evidence of pulmonary malignancy. Patient may benefit from followup two view chest with cardiac leads removed.  Calcified aorta the.  Acromioclavicular joint degenerative changes.  Mild scoliosis. CT detected T7 and L1 mild compression deformity incompletely assessed on present exam.  IMPRESSION: Heart size top-normal to minimally enlarged. Technique may contribute to this  appearance.  Central pulmonary vascular prominence without frank pulmonary edema.  No segmental infiltrate or pneumothorax.  No plain film evidence of pulmonary malignancy. Patient may benefit from followup two view chest with cardiac leads removed.  Calcified aorta.   Electronically Signed   By: Bridgett Larsson M.D.   On: 09/08/2013 11:11     EKG Interpretation   Date/Time:  Tuesday September 08 2013 10:48:48 EDT Ventricular Rate:  94 PR Interval:  146 QRS Duration: 83 QT Interval:  363 QTC Calculation: 454 R Axis:   48 Text Interpretation:  Sinus rhythm Minimal ST  elevation, anterior leads,  no reciprocal changes No old tracing to compare Confirmed by [facility]  MD,  Leonette Most [phone]) on 09/08/2013 10:55:48 AM      MDM   Final diagnoses:  NSTEMI (non-ST elevated myocardial infarction)    Given initial EKG with incorrect demographics and that EMS unit not available to confirm that this was the current patient as well as the improvement seen on subsequent EKG, will begin initial evaluation in the ED. Dr. Tresa Endo who has seen the patient before was in the ED on patient arrival to evaluate her and see her EKG. He asks to initiate Heparin and Nitro, check Trop and will go from there.  1:37 PM Initial Trop positive. Cardiology is at bedside to admit the patient.     Arthor Gorter B. Bernette Mayers, MD 09/08/13 [phone]

## 2013-09-08 NOTE — Progress Notes (Signed)
ANTICOAGULATION CONSULT NOTE - Follow Up Consult  Pharmacy Consult for heparin Indication: CAD awaiting PCI  Labs:  Recent Labs  09/08/13 1055 09/08/13 2040  HGB 13.1  --   HCT 37.9  --   PLT 254  --   APTT 31  --   LABPROT 12.8  --   INR 0.98  --   CREATININE 0.82  --   TROPONINI 0.71* 4.78*    Assessment: 62yo female went to cath today w/ significant 2-vessel dz, s/p one successful PCI, now awaiting planned PCI for RCA stenosis, to resume heparin 8hr after sheath removal; RN reports sheath pulled at 2210.  Goal of Therapy:  Heparin level 0.3-0.7 units/ml Monitor platelets by anticoagulation protocol: Yes   Plan:  Will begin heparin gtt at 800 units/hr at 0610 and monitor CBC and heparin levels.  Vernard Gambles, PharmD, BCPS  09/08/2013,11:23 PM

## 2013-09-08 NOTE — ED Notes (Signed)
Pt from home.  Initially had substernal CP that she thought was acid reflux.  Pain continued and is now L CP as well.  Pt took 324mg  Aspirin.  Pt given 2 SL Nitro and 4mg  Zofran by EMS.  Pt continues to rate pain 7/10 described as stabbing/burning.

## 2013-09-08 NOTE — Progress Notes (Signed)
ANTICOAGULATION CONSULT NOTE - Initial Consult  Pharmacy Consult for heparin Indication: chest pain/ACS  Allergies  Allergen Reactions  . Penicillins Other (See Comments)    PATIENT REPORTS "MOTHER TOLD HER SHE HAD ALLERGY TO PCN"  . Morphine And Related Rash    Patient Measurements: Height: 5\' 4"  (162.6 cm) Weight: 145 lb (65.772 kg) IBW/kg (Calculated) : 54.7  Vital Signs: Temp: 98.3 F (36.8 C) (03/31 1055) Temp src: Oral (03/31 1055) BP: 123/79 mmHg (03/31 1055) Pulse Rate: 86 (03/31 1055)  Labs: No results found for this basename: HGB, HCT, PLT, APTT, LABPROT, INR, HEPARINUNFRC, CREATININE, CKTOTAL, CKMB, TROPONINI,  in the last 72 hours  Estimated Creatinine Clearance: 68 ml/min (by C-G formula based on Cr of 0.81).   Medical History: History reviewed. No pertinent past medical history.  Medications:  See electronic med rec  Assessment: 63 y.o. female presents with chest pain. Found to have positive troponin. To begin heparin for r/o ACS. Baseline labs pending. On no anticoagulation PTA.  Goal of Therapy:  Heparin level 0.3-0.7 units/ml Monitor platelets by anticoagulation protocol: Yes   Plan:  1. Heparin IV bolus 4000 units 2. Heparin gtt at 800 units/hr 3. Will f/u 6 hr heparin level 4. Daily heparin level and CBC   Christoper Fabian, PharmD, BCPS Clinical pharmacist, pager [phone] 09/08/2013,11:13 AM

## 2013-09-08 NOTE — ED Notes (Signed)
Transported to Cath lab.

## 2013-09-08 NOTE — H&P (Addendum)
Cardiologist:  Olivia Wade is an 63 y.o. female.    Chief Complaint:  Chest Pain HPI:   The patient is a 63 yo caucasian female with a history of AAA, COPD, asthma, HTN, tobacco abuse, HLD, chronic low back pain(old vertebral fracture), GERD.  Her last Abd [place] was 05/31/13: Infrarenal abdominal aortic aneurysm measuring 3.5 x 3.6 cm.     At approximately 1100hrs, while watching TV and drinking coffee, she developed sharp/burning chest pain which radiated across her chest and up to the left side of her neck.  There was associated nausea, dyspnea, dizziness.  The patient currently denies vomiting, fever,  orthopnea, dizziness, PND, cough, congestion, abdominal pain, hematochezia, melena, lower extremity edema, claudication.   Past Medical History  Diagnosis Date  . AAA (abdominal aortic aneurysm)   . Tobacco abuse   . HTN (hypertension)   . COPD (chronic obstructive pulmonary disease)   . Asthma   . HLD (hyperlipidemia)   . GERD (gastroesophageal reflux disease)   . Chronic low back pain     (Old vertebral fracture)    History reviewed. No pertinent past surgical history.  Family History  Problem Relation Age of Onset  . Heart attack Father   . Heart attack Son 21  . Cancer Father     Esophageal  . Hypertension Mother    Social History:  reports that she has been smoking Cigarettes.  She has a 18.5 pack-year smoking history. She does not have any smokeless tobacco history on file. She reports that she does not drink alcohol or use illicit drugs.  Allergies:  Allergies  Allergen Reactions  . Penicillins Other (See Comments)    PATIENT REPORTS "MOTHER TOLD HER SHE HAD ALLERGY TO PCN"  . Morphine And Related Rash     (Not in a hospital admission)  Results for orders placed during the hospital encounter of 09/08/13 (from the past 48 hour(s))  CBC WITH DIFFERENTIAL     Status: None   Collection Time    09/08/13 10:55 AM      Result Value Ref Range   WBC 7.3  4.0  - 10.5 K/uL   RBC 3.92  3.87 - 5.11 MIL/uL   Hemoglobin 13.1  12.0 - 15.0 g/dL   HCT 37.9  36.0 - 46.0 %   MCV 96.7  78.0 - 100.0 fL   MCH 33.4  26.0 - 34.0 pg   MCHC 34.6  30.0 - 36.0 g/dL   RDW 12.9  11.5 - 15.5 %   Platelets 254  150 - 400 K/uL   Neutrophils Relative % 55  43 - 77 %   Neutro Abs 4.1  1.7 - 7.7 K/uL   Lymphocytes Relative 37  12 - 46 %   Lymphs Abs 2.7  0.7 - 4.0 K/uL   Monocytes Relative 6  3 - 12 %   Monocytes Absolute 0.4  0.1 - 1.0 K/uL   Eosinophils Relative 1  0 - 5 %   Eosinophils Absolute 0.1  0.0 - 0.7 K/uL   Basophils Relative 1  0 - 1 %   Basophils Absolute 0.0  0.0 - 0.1 K/uL  BASIC METABOLIC PANEL     Status: Abnormal   Collection Time    09/08/13 10:55 AM      Result Value Ref Range   Sodium 139  137 - 147 mEq/L   Potassium 3.8  3.7 - 5.3 mEq/L   Chloride 103  96 - 112 mEq/L  CO2 22  19 - 32 mEq/L   Glucose, Bld 98  70 - 99 mg/dL   BUN 9  6 - 23 mg/dL   Creatinine, Ser 0.82  0.50 - 1.10 mg/dL   Calcium 9.0  8.4 - 10.5 mg/dL   GFR calc non Af Amer 76 (*) >90 mL/min   GFR calc Af Amer 88 (*) >90 mL/min   Comment: (NOTE)     The eGFR has been calculated using the CKD EPI equation.     This calculation has not been validated in all clinical situations.     eGFR's persistently <90 mL/min signify possible Chronic Kidney     Disease.  TROPONIN I     Status: Abnormal   Collection Time    09/08/13 10:55 AM      Result Value Ref Range   Troponin I 0.71 (*) <0.30 ng/mL   Comment:            Due to the release kinetics of cTnI,     a negative result within the first hours     of the onset of symptoms does not rule out     myocardial infarction with certainty.     If myocardial infarction is still suspected,     repeat the test at appropriate intervals.     CRITICAL RESULT CALLED TO, READ BACK BY AND VERIFIED WITH:     M.ORR,RN 09/08/13 1153 BY BSLADE  PRO B NATRIURETIC PEPTIDE     Status: None   Collection Time    09/08/13 10:55 AM       Result Value Ref Range   Pro B Natriuretic peptide (BNP) 61.8  0 - 125 pg/mL  PROTIME-INR     Status: None   Collection Time    09/08/13 10:55 AM      Result Value Ref Range   Prothrombin Time 12.8  11.6 - 15.2 seconds   INR 0.98  0.00 - 1.49  APTT     Status: None   Collection Time    09/08/13 10:55 AM      Result Value Ref Range   aPTT 31  24 - 37 seconds  I-STAT TROPOININ, ED     Status: Abnormal   Collection Time    09/08/13 11:00 AM      Result Value Ref Range   Troponin i, poc 0.30 (*) 0.00 - 0.08 ng/mL   Comment NOTIFIED PHYSICIAN     Comment 3            Comment: Due to the release kinetics of cTnI,     a negative result within the first hours     of the onset of symptoms does not rule out     myocardial infarction with certainty.     If myocardial infarction is still suspected,     repeat the test at appropriate intervals.   Dg Chest Port 1 View  09/08/2013   CLINICAL DATA:  Chest pain. Shortness breath. Cough. Asthma. Smoker. High blood pressure.  EXAM: PORTABLE CHEST - 1 VIEW  COMPARISON:  05/29/2013 and 09/03/2011.  The  FINDINGS: Heart size top-normal to minimally enlarged. Technique may contribute to this appearance.  Central pulmonary vascular prominence without frank pulmonary edema.  No segmental infiltrate or pneumothorax.  No plain film evidence of pulmonary malignancy. Patient may benefit from followup two view chest with cardiac leads removed.  Calcified aorta the.  Acromioclavicular joint degenerative changes.  Mild scoliosis. CT detected T7 and L1 mild  compression deformity incompletely assessed on present exam.  IMPRESSION: Heart size top-normal to minimally enlarged. Technique may contribute to this appearance.  Central pulmonary vascular prominence without frank pulmonary edema.  No segmental infiltrate or pneumothorax.  No plain film evidence of pulmonary malignancy. Patient may benefit from followup two view chest with cardiac leads removed.  Calcified aorta.    Electronically Signed   By: Chauncey Cruel M.D.   On: 09/08/2013 11:11    Review of Systems  Constitutional: Negative for fever and diaphoresis.  HENT: Negative for congestion and sore throat.   Respiratory: Positive for shortness of breath. Negative for cough.   Cardiovascular: Positive for chest pain. Negative for orthopnea, leg swelling and PND.  Gastrointestinal: Positive for nausea. Negative for vomiting, abdominal pain, blood in stool and melena.  Genitourinary: Negative for dysuria and hematuria.  Musculoskeletal: Positive for neck pain.  Neurological: Positive for dizziness.  All other systems reviewed and are negative.    Blood pressure 113/87, pulse 93, temperature 98.3 F (36.8 C), temperature source Oral, resp. rate 32, height _0  (1.626 m), weight 145 lb (65.772 kg), SpO2 97.00%. Physical Exam  Nursing note and vitals reviewed. Constitutional: She is oriented to person, place, and time. She appears well-developed and well-nourished. No distress.  HENT:  Head: Normocephalic and atraumatic.  Eyes: EOM are normal. Pupils are equal, round, and reactive to light. No scleral icterus.  Neck: Normal range of motion. Neck supple. No JVD present.  Cardiovascular: Normal rate, regular rhythm, S1 normal and S2 normal.   No murmur heard. Pulses:      Radial pulses are 2+ on the right side, and 2+ on the left side.       Dorsalis pedis pulses are 2+ on the right side, and 2+ on the left side.  Respiratory: Effort normal and breath sounds normal. She has no wheezes. She has no rales.  GI: Soft. Bowel sounds are normal. There is no tenderness.  Musculoskeletal: She exhibits no edema.  Lymphadenopathy:    She has no cervical adenopathy.  Neurological: She is alert and oriented to person, place, and time. She exhibits normal muscle tone.  Skin: Skin is warm and dry.  Psychiatric: She has a normal mood and affect.     Assessment/Plan Principal Problem:   NSTEMI (non-ST elevated  myocardial infarction) Active Problems:   Unstable angina   Tobacco abuse   Abdominal aortic aneurysm   HTN (hypertension)   Hyperlipidemia   Asthma with COPD  Plan:  She will be admitted to [facility] unit.  IV heparin and NTG were already started. Continue Toprol XL 64m, lipitor.  Continue daily asa.  Cycle troponin. Check lipids, TSH, A1C.  She is NPO for possible left heart cath today.  2D echo.     LOS: 0 days    HAGER, BRYAN PA-C 09/08/2013 1:51 PM    HAGER, BRYAN 09/08/2013, 1:45 PM    Patient seen and examined. Agree with assessment and plan.Very pleasant former pt of Dr. WRollene Farewho established care with me in December 2014. She has an extensive 89yr h/o tobacco abuse and dovumented AAA. She was brought in by EMS today. Initial ECG showed hyperacute T wave with mild J ponit elevation anteriorly which improved with NTG sl. Upon arrival to ER ECG was better but she was still having 6/10 CP. She was started on IV heparin and NTG. CP now is 1/10 Initial Trop is mildly positive. Suspect ACS; will plan to cath later today. Discussed  procedure in detail with patient who agrees to proceed with definitive evaluation.   Troy Sine, MD, [facility] 09/08/2013 2:26 PM

## 2013-09-08 NOTE — Interval H&P Note (Signed)
History and Physical Interval Note:  09/08/2013 4:41 PM  Olivia Wade  has presented today for surgery, with the diagnosis of CP  The various methods of treatment have been discussed with the patient and family. After consideration of risks, benefits and other options for treatment, the patient has consented to  Procedure(s): LEFT HEART CATHETERIZATION WITH CORONARY ANGIOGRAM (N/A) WITH POSSSIBLE PCICath Lab Visit (complete for each Cath Lab visit)  Clinical Evaluation Leading to the Procedure:   ACS: yes  Non-ACS:    Anginal Classification: CCS IV  Anti-ischemic medical therapy: Maximal Therapy (2 or more classes of medications)  Non-Invasive Test Results: No non-invasive testing performed  Prior CABG: No previous CABG       as a surgical intervention .  The patient's history has been reviewed, patient examined, no change in status, stable for surgery.  I have reviewed the patient's chart and labs.  Questions were answered to the patient's satisfaction.     Ilijah Doucet A

## 2013-09-08 NOTE — CV Procedure (Signed)
Olivia Wade is a 63 y.o. female    [id number]  [id number] LOCATION:  FACILITY: [facility]  PHYSICIAN: Lennette Bihari, MD, [facility] [birth year]   DATE OF PROCEDURE:  09/08/2013    CARDIAC CATHETERIZATION/PERCUTANEOUS CORONARY INTERVENTION     HISTORY: [name] is a 63 year old female with a long-standing history, documented abdominal aortic aneurysm, hypertension, hyperlipidemia, COPD, and GERD. She developed significant chest pain this morning and was interpreted as a possible ST segment elevation myocardial infarction by initial ECG which showed the beginning of hyperacute T waves anteriorly. She was given nitroglycerin, and there was some improvement in her subsequent ECG. Consequently, she was not taken immediately to the Cath Lab but was started on heparin and IV nitroglycerin. She continued to experience residual chest pain. Troponins subtlely came back positive. She is now taken to the cardiac catheterization laboratory for urgent catheterization with need for probable percutaneous coronary intervention.   PROCEDURE: Left heart cath, coronary angiography, left ventriculography, distal aortagraphy, ilial femoral runoff, PCI to LAD with angiosculpt cutting balloon, DES Stent, PTCA.  The patient was brought to the second floor [facility] Cardiac cath lab and had mild residual chest discomfort. She does have chronic back issues. She required significant sedation and initially to milligrams Versed and 50 mcg of fentanyl were administered. However, throughout the procedure, she received a total of 6 mg of Versed and 200 mcg of fentanyl. A right femoral artery was punctured anteriorly and a5 French sheath was inserted without difficulty. Diagnostic catheterization was done with 5 French Judkins 4 left and right coronary catheters. IC nitroglycerin was selectively injected down the RCA.  A 5 French pigtail catheter was used for left ventriculography. With her previous demonstration of abdominal  aortic aneurysm, distal aortography was performed which confirmed a moderate size long infrarenal aneurysm extending from just below the renal arteries to just proximal to the iliac bifurcation. Initially, there was poor visualization to the right iliac system. An additional injection was made just above the iliac bifurcation for visualization of bilateral iliac and femoral arteries. With the demonstration of high-grade subtotal LAD proximally with associated wall motion abnormality anterolaterally due to probable cutely stunned myocardium percutaneous coronary intervention was performed on the LAD. Bivalirudin bolus plus infusion was administered. The Brilinta180 mg was given orally. A 6 French XB LAD 3.5 guiding catheter was inserted. A pylori was advanced down the LAD. With a very eccentric greater than 95% LAD stenosis initial dilatation was done using an angioscope balloon to allow for improved plaque dispersion with predilatation. A 3.0x23 mm Xience Alpine stent was then inserted with the stent placed proximally just after a high optional diagonal-like vessel to cover this area of stenosis just proximal to the first septal perforator artery. The LAD was diseased beyond this up until the 95% stenosis. The stent covered the entire region was dilated x2 at 12 and 14 atmospheres. An  [place] Euphora balloon 3.25x20 mm was used for post stent dilatation up to 14 atmospheres. Angiography confirmed an excellent result. The patient left the catheterization laboratory chest pain free with stable hemodynamics.  HEMODYNAMICS:   Central Aorta: 110/70   Left Ventricle: 110/13  ANGIOGRAPHY:  1. Left main: Angiographically normal and bifurcated into the LAD and left circumflex coronary arteries.  2. LAD: Size vessel that gave rise to a diagonal vessel immediately which was like an optimal diagonal vessel. Just beyond this was 50% narrowing proximal to the first septal perforating artery. The LAD was then irregularly  narrowed until  a greater than 95% eccentric focal stenosis in the proximal segment. There was 40-50% mid LAD narrowing. The LAD extended to the apex. 3. Left circumflex: Angiographically normal vessel which gave rise to 2 small marginal branches.  4. Right coronary artery: Large vessel that appeared to have a very focal "apple core" stenosis of 85-90% in the very proximal segment followed by 50% stenosis. There was 30-40% narrowing in the mid RCA  Left Ventriculography demonstrated moderate mid distal anterolateral hypocontractility extending to the apex representing probably stunned myocardium from the subtotal proximal LAD stenosis.  Distal aortography revealed a moderate size aneurysm commencing just below the renal arteries and ending immediately proximal to the iliac bifurcation. On the selective iliofemoral injection there was fairly normal appearing bilateral iliac and femoral arteries without aneurysmal dilatation.  Following successful percutaneous coronary intervention to the proximal LAD with angioscope cutting balloon, DES stenting with a 3.0x23 mm Xience Alpine stent, postdilated to 3.25 mm, the entire proximal LAD region was reduced to 0%. There was brisk TIMI-3 flow. There is no evidence for dissection.  IMPRESSION:  Acute coronary syndrome secondary to subtotal proximal LAD stenosis with associated moderate mid distal anterolateral hypocontractility and initial ejection fraction of approximately 45-50%  Significant 2 vessel coronary obstructive disease with 50% very proximal LAD stenosis followed by 95% eccentric proximal LAD stenosis and 40-50% focal mid LAD stenosis; and significant 85-90% "apple core" stenosis of the proximal RCA followed by 50-60% narrowing with 30-40% mid RCA stenosis.  Moderate size infrarenal abdominal aortic aneurysm  Successful percutaneous coronary intervention with angioscope cutting balloon, PTCA, and DES stenting with a Xience Alpine 3.0x23 mm stent  postdilated to 3.25 mm.   RECOMMENDATION:  [name] developed an acute coronary syndrome today initially associated with acute T waves anteriorly which is secondary to her subtotal proximal LAD stenosis. She is now pain-free following successful intervention but does have initial wall motion abnormality involving the mid distal anterolateral wall. Following sheath removal, she  will be restarted back on heparin with plans for PCI to the high-grade proximal RCA stenosis in 48 hours on 09/10/2013. She will be maintained on IV nitroglycerin and heparin until the subsequent procedure.  Lennette Biharihomas A. Novalee Horsfall, MD, [facility] Center[facility] 09/08/2013 6:37 PM

## 2013-09-09 DIAGNOSIS — I214 Non-ST elevation (NSTEMI) myocardial infarction: Secondary | ICD-10-CM

## 2013-09-09 LAB — CBC
HCT: 36.2 % (ref 36.0–46.0)
Hemoglobin: 12.5 g/dL (ref 12.0–15.0)
MCH: 33 pg (ref 26.0–34.0)
MCHC: 34.5 g/dL (ref 30.0–36.0)
MCV: 95.5 fL (ref 78.0–100.0)
PLATELETS: 254 10*3/uL (ref 150–400)
RBC: 3.79 MIL/uL — ABNORMAL LOW (ref 3.87–5.11)
RDW: 13 % (ref 11.5–15.5)
WBC: 11.3 10*3/uL — AB (ref 4.0–10.5)

## 2013-09-09 LAB — BASIC METABOLIC PANEL
BUN: 6 mg/dL (ref 6–23)
CHLORIDE: 101 meq/L (ref 96–112)
CO2: 20 meq/L (ref 19–32)
Calcium: 8.6 mg/dL (ref 8.4–10.5)
Creatinine, Ser: 0.7 mg/dL (ref 0.50–1.10)
GFR calc Af Amer: >90 mL/min (ref >90)
GFR calc non Af Amer: >90 mL/min (ref >90)
Glucose, Bld: 121 mg/dL — ABNORMAL HIGH (ref 70–99)
Potassium: 3.8 mEq/L (ref 3.7–5.3)
Sodium: 138 mEq/L (ref 137–147)

## 2013-09-09 LAB — LIPID PANEL
CHOLESTEROL: 140 mg/dL (ref 0–200)
HDL: 40 mg/dL (ref >39)
LDL CALC: 67 mg/dL (ref 0–99)
Total CHOL/HDL Ratio: 3.5 RATIO
Triglycerides: 163 mg/dL — ABNORMAL HIGH (ref <150)
VLDL: 33 mg/dL (ref 0–40)

## 2013-09-09 LAB — HEMOGLOBIN A1C
HEMOGLOBIN A1C: 4.8 % (ref <5.7)
Mean Plasma Glucose: 91 mg/dL (ref <117)

## 2013-09-09 LAB — TROPONIN I
Troponin I: 3.86 ng/mL (ref <0.30)
Troponin I: 1.98 ng/mL (ref <0.30)

## 2013-09-09 LAB — MRSA PCR SCREENING: MRSA by PCR: NEGATIVE

## 2013-09-09 LAB — HEPARIN LEVEL (UNFRACTIONATED)
Heparin Unfractionated: 0.21 IU/mL — ABNORMAL LOW (ref 0.30–0.70)
Heparin Unfractionated: 0.33 [IU]/mL (ref 0.30–0.70)

## 2013-09-09 MED ORDER — SODIUM CHLORIDE 0.9 % IV SOLN: 250.0000 mL | INTRAVENOUS | Status: DC | PRN

## 2013-09-09 MED ORDER — ONDANSETRON HCL 4 MG/2ML IJ SOLN: 4.0000 mg | Freq: Four times a day (QID) | INTRAMUSCULAR | Status: DC | PRN

## 2013-09-09 MED ORDER — ONDANSETRON HCL 4 MG/2ML IJ SOLN
4.0000 mg | Freq: Four times a day (QID) | INTRAMUSCULAR | Status: DC | PRN
  Administered 2013-09-09: 4 mg via INTRAVENOUS
  Filled 2013-09-09: qty 2

## 2013-09-09 MED ORDER — HEPARIN (PORCINE) IN NACL 100-0.45 UNIT/ML-% IJ SOLN
1050.0000 [IU]/h | INTRAMUSCULAR | Status: DC
  Administered 2013-09-10: 1050 [IU]/h via INTRAVENOUS
  Filled 2013-09-09 (×3): qty 250

## 2013-09-09 MED ORDER — SODIUM CHLORIDE 0.9 % IJ SOLN: 3.0000 mL | INTRAMUSCULAR | Status: DC | PRN

## 2013-09-09 MED ORDER — SODIUM CHLORIDE 0.9 % IJ SOLN
3.0000 mL | Freq: Two times a day (BID) | INTRAMUSCULAR | Status: DC
  Administered 2013-09-09 – 2013-09-10 (×2): 3 mL via INTRAVENOUS

## 2013-09-09 MED ORDER — ASPIRIN 81 MG PO CHEW: 81.0000 mg | CHEWABLE_TABLET | ORAL | Status: AC

## 2013-09-09 MED ORDER — SODIUM CHLORIDE 0.9 % IV SOLN
INTRAVENOUS | Status: DC
  Administered 2013-09-10: 08:00:00 via INTRAVENOUS

## 2013-09-09 MED ORDER — ACETAMINOPHEN 325 MG PO TABS: 650.0000 mg | ORAL_TABLET | ORAL | Status: DC | PRN

## 2013-09-09 MED FILL — Sodium Chloride IV Soln 0.9%: INTRAVENOUS | Qty: 50 | Status: AC

## 2013-09-09 NOTE — Progress Notes (Signed)
ANTICOAGULATION CONSULT NOTE - Follow Up Consult  Pharmacy Consult for Heparin Indication: CAD awaiting PCI  Allergies  Allergen Reactions  . Penicillins Other (See Comments)    PATIENT REPORTS "MOTHER TOLD HER SHE HAD ALLERGY TO PCN"  . Morphine And Related Rash    Patient Measurements: Height: 5\' 4"  (162.6 cm) Weight: 146 lb 13.2 oz (66.6 kg) IBW/kg (Calculated) : 54.7 Heparin Dosing Weight: 66.6 kg  Vital Signs: Temp: 98.8 F (37.1 C) (04/01 2013) Temp src: Oral (04/01 2013) BP: 96/73 mmHg (04/01 1721) Pulse Rate: 110 (04/01 1721)  Labs:  Recent Labs  09/08/13 1055 09/08/13 2040 09/09/13 0046 09/09/13 0055 09/09/13 0550 09/09/13 1330 09/09/13 2048  HGB 13.1  --   --  12.5  --   --   --   HCT 37.9  --   --  36.2  --   --   --   PLT 254  --   --  254  --   --   --   APTT 31  --   --   --   --   --   --   LABPROT 12.8  --   --   --   --   --   --   INR 0.98  --   --   --   --   --   --   HEPARINUNFRC  --   --   --   --   --  0.21* 0.33  CREATININE 0.82  --   --  0.70  --   --   --   TROPONINI 0.71* 4.78* 3.86*  --  1.98*  --   --     Estimated Creatinine Clearance: 69.4 ml/min (by C-G formula based on Cr of 0.7).  Assessment:   Heparin level tonight is low therapeutic (0.33) on 950 units/hr. S/p DES to LAD on 3/31. For repeat cath on 4/2 for possible DES to RCA.  Goal of Therapy:  Heparin level 0.3-0.7 units/ml Monitor platelets by anticoagulation protocol: Yes   Plan:   Increase heparin drip to 1050 units/hr, to try to keep level in goal range.  Next heparin level and CBC in am.  Dennie Fetters, RPh Pager: [phone] 09/09/2013,10:24 PM

## 2013-09-09 NOTE — Progress Notes (Signed)
ANTICOAGULATION CONSULT NOTE - Follow Up Consult  Pharmacy Consult for heparin Indication: CAD awaiting PCI  Labs:  Recent Labs  09/08/13 1055 09/08/13 2040 09/09/13 0046 09/09/13 0055 09/09/13 0550 09/09/13 1330  HGB 13.1  --   --  12.5  --   --   HCT 37.9  --   --  36.2  --   --   PLT 254  --   --  254  --   --   APTT 31  --   --   --   --   --   LABPROT 12.8  --   --   --   --   --   INR 0.98  --   --   --   --   --   HEPARINUNFRC  --   --   --   --   --  0.21*  CREATININE 0.82  --   --  0.70  --   --   TROPONINI 0.71* 4.78* 3.86*  --  1.98*  --     Assessment: 63yo female went to cath today w/ significant 2-vessel dz, s/p one successful PCI, now awaiting planned PCI for RCA stenosis on heparin at 800 units/hr and below goal (HL= 0.21).  Goal of Therapy:  Heparin level 0.3-0.7 units/ml Monitor platelets by anticoagulation protocol: Yes   Plan:  -Increase heparin to 950 units/hr -Heparin level in 6 hours  Harland German, Pharm D 09/09/2013 3:09 PM

## 2013-09-09 NOTE — Care Management Note (Signed)
    Page 1 of 1   09/09/2013     10:17:55 AM   CARE MANAGEMENT NOTE 09/09/2013  Patient:  MCKENSEY, PLACE   Account Number:  [id number]  Date Initiated:  09/09/2013  Documentation initiated by:  Junius Creamer  Subjective/Objective Assessment:   adm w mi     Action/Plan:   lives w husband, pcp dr Lyda Perone bluth   Anticipated DC Date:     Anticipated DC Plan:        DC Planning Services  CM consult  Medication Assistance      Choice offered to / List presented to:             Status of service:   Medicare Important Message given?   (If response is "NO", the following Medicare IM given date fields will be blank) Date Medicare IM given:   Date Additional Medicare IM given:    Discharge Disposition:    Per UR Regulation:  Reviewed for med. necessity/level of care/duration of stay  If discussed at Long Length of Stay Meetings, dates discussed:    Comments:  4/1 1015a debbie Tehila Sokolow rn,bsn gave pt brilinta 30day free and copay assist card.

## 2013-09-09 NOTE — Progress Notes (Addendum)
Subjective:  Pt doing well s/p cath POD #1, denies current CP  Objective:  Vital Signs in the last 24 hours: Temp:  [97.3 F (36.3 C)-98.8 F (37.1 C)] 98.8 F (37.1 C) (04/01 1218) Pulse Rate:  [67-124] 105 (04/01 1218) Resp:  [15-29] 27 (04/01 1218) BP: (67-141)/(38-109) 120/93 mmHg (04/01 1218) SpO2:  [95 %-100 %] 98 % (04/01 1218) Weight:  [146 lb 13.2 oz (66.6 kg)] 146 lb 13.2 oz (66.6 kg) (04/01 0332)  Intake/Output from previous day: 03/31 0701 - 04/01 0700 In: 2027.4 [P.O.:460; I.V.:1567.4] Out: 3000 [Urine:3000] Intake/Output from this shift: Total I/O In: 403.5 [I.V.:403.5] Out: 550 [Urine:550]  Physical Exam: General appearance: alert, cooperative and appears stated age Neck: no JVD Lungs: clear to auscultation bilaterally Heart: regular rate and rhythm and no murmurs appreciated Abdomen: soft, non-tender; bowel sounds normal; no masses,  no organomegaly Extremities: extremities normal, atraumatic, no cyanosis or edema and cath site c/d/i Pulses: 2+ and symmetric Skin: Skin color, texture, turgor normal. No rashes or lesions Neurologic: Grossly normal  Lab Results:  Recent Labs  09/08/13 1055 09/09/13 0055  WBC 7.3 11.3*  HGB 13.1 12.5  PLT 254 254    Recent Labs  09/08/13 1055 09/09/13 0055  NA 139 138  K 3.8 3.8  CL 103 101  CO2 22 20  GLUCOSE 98 121*  BUN 9 6  CREATININE 0.82 0.70    Recent Labs  09/09/13 0046 09/09/13 0550  TROPONINI 3.86* 1.98*   Hepatic Function Panel No results found for this basename: PROT, ALBUMIN, AST, ALT, ALKPHOS, BILITOT, BILIDIR, IBILI,  in the last 72 hours  Recent Labs  09/09/13 0055  CHOL 140   No results found for this basename: PROTIME,  in the last 72 hours   Cardiac Studies: 09/08/13 ANGIOGRAPHY:  1. Left main: Angiographically normal and bifurcated into the LAD and left circumflex coronary arteries.  2. LAD: Size vessel that gave rise to a diagonal vessel immediately which was like an  optimal diagonal vessel. Just beyond this was 50% narrowing proximal to the first septal perforating artery. The LAD was then irregularly narrowed until a greater than 95% eccentric focal stenosis in the proximal segment. There was 40-50% mid LAD narrowing. The LAD extended to the apex.  3. Left circumflex: Angiographically normal vessel which gave rise to 2 small marginal branches.  4. Right coronary artery: Large vessel that appeared to have a very focal "apple core" stenosis of 85-90% in the very proximal segment followed by 50% stenosis. There was 30-40% narrowing in the mid RCA  Assessment/Plan:  NSTEMI s/p PCI POD #1 HTN AAA Hyperlipidemia Asthma COPD   Plan:  Pt with DES to LAD, continue with heparin gtt, brilinta and ASA, lisinopril, metoprolol, statin, nitro gtt Plan for repeat cath tomorrow for possible DES to RCA  Hgb and creatinine stable this AM, repeat prior to procedure tomorrow  F/U on TSH and A1C   LOS: 1 day    Gildardo Cranker 09/09/2013, 1:29 PM   I have examined the patient and reviewed assessment and plan and discussed with patient.  Agree with above as stated.  Plan for repeat PCI tomorrow.  Avoid tobacco in the future.  Randi [employer] S.

## 2013-09-10 ENCOUNTER — Encounter (HOSPITAL_COMMUNITY)
Admission: EM | Disposition: A | Discharge status: Home / Self Care | Payer: BC Managed Care – PPO | Source: Home / Self Care | Attending: Cardiovascular Disease

## 2013-09-10 ENCOUNTER — Other Ambulatory Visit: Payer: Self-pay

## 2013-09-10 ENCOUNTER — Ambulatory Visit (HOSPITAL_COMMUNITY)
Admission: RE | Admit: 2013-09-10 | Payer: BC Managed Care – PPO | Source: Ambulatory Visit | Admitting: Cardiovascular Disease

## 2013-09-10 DIAGNOSIS — Z9861 Coronary angioplasty status: Secondary | ICD-10-CM

## 2013-09-10 HISTORY — PX: CORONARY ANGIOPLASTY WITH STENT PLACEMENT: SHX49

## 2013-09-10 HISTORY — PX: PERCUTANEOUS CORONARY STENT INTERVENTION (PCI-S): SHX5485

## 2013-09-10 LAB — CBC
HCT: 37.6 % (ref 36.0–46.0)
HEMOGLOBIN: 12.9 g/dL (ref 12.0–15.0)
MCH: 33.1 pg (ref 26.0–34.0)
MCHC: 34.3 g/dL (ref 30.0–36.0)
MCV: 96.4 fL (ref 78.0–100.0)
Platelets: 256 10*3/uL (ref 150–400)
RBC: 3.9 MIL/uL (ref 3.87–5.11)
RDW: 13.4 % (ref 11.5–15.5)
WBC: 10.3 10*3/uL (ref 4.0–10.5)

## 2013-09-10 LAB — HEPARIN LEVEL (UNFRACTIONATED): Heparin Unfractionated: 0.33 IU/mL (ref 0.30–0.70)

## 2013-09-10 LAB — POCT ACTIVATED CLOTTING TIME: ACTIVATED CLOTTING TIME: 359 s

## 2013-09-10 SURGERY — PERCUTANEOUS CORONARY STENT INTERVENTION (PCI-S)
Anesthesia: LOCAL

## 2013-09-10 MED ORDER — TICAGRELOR 90 MG PO TABS
ORAL_TABLET | ORAL | Status: AC
  Filled 2013-09-10: qty 1

## 2013-09-10 MED ORDER — FENTANYL CITRATE 0.05 MG/ML IJ SOLN
INTRAMUSCULAR | Status: AC
  Filled 2013-09-10: qty 2

## 2013-09-10 MED ORDER — MIDAZOLAM HCL 2 MG/2ML IJ SOLN
INTRAMUSCULAR | Status: AC
  Filled 2013-09-10: qty 2

## 2013-09-10 MED ORDER — NITROGLYCERIN 0.2 MG/ML ON CALL CATH LAB
INTRAVENOUS | Status: AC
  Filled 2013-09-10: qty 1

## 2013-09-10 MED ORDER — ATROPINE SULFATE 0.1 MG/ML IJ SOLN
INTRAMUSCULAR | Status: AC
  Filled 2013-09-10: qty 10

## 2013-09-10 MED ORDER — BIVALIRUDIN 250 MG IV SOLR
INTRAVENOUS | Status: AC
  Filled 2013-09-10: qty 250

## 2013-09-10 MED ORDER — SODIUM CHLORIDE 0.9 % IV SOLN
INTRAVENOUS | Status: DC
  Administered 2013-09-10: 20:00:00 via INTRAVENOUS

## 2013-09-10 MED ORDER — TICAGRELOR 90 MG PO TABS
90.0000 mg | ORAL_TABLET | Freq: Two times a day (BID) | ORAL | Status: DC
  Administered 2013-09-11: 90 mg via ORAL
  Filled 2013-09-10 (×2): qty 1

## 2013-09-10 MED ORDER — ASPIRIN EC 81 MG PO TBEC
81.0000 mg | DELAYED_RELEASE_TABLET | Freq: Every day | ORAL | Status: DC
  Administered 2013-09-11: 81 mg via ORAL
  Filled 2013-09-10: qty 1

## 2013-09-10 MED ORDER — LIDOCAINE HCL (PF) 1 % IJ SOLN
INTRAMUSCULAR | Status: AC
  Filled 2013-09-10: qty 30

## 2013-09-10 MED ORDER — HEPARIN (PORCINE) IN NACL 2-0.9 UNIT/ML-% IJ SOLN
INTRAMUSCULAR | Status: AC
  Filled 2013-09-10: qty 1000

## 2013-09-10 NOTE — Progress Notes (Signed)
[id number] Did not walk with pt as she is going for staged PCI. MI education completed with pt. Understanding voiced. Needed re enforcement with teach back question re NTG use. Discussed smoking cessation and handouts given. Pt stated she was down to 4 cigarettes a day. May use nicotine patches. Gave stent booklet and reviewed brilinta use. Pt has brililnta booklet. Discussed CRP 2 and pt gave permission to refer to [place] Phase 2. Will give ex ed tomorrow after walking with pt. Will follow up. Luetta Nutting RN BSN 09/10/2013 10:07 AM

## 2013-09-10 NOTE — CV Procedure (Signed)
Olivia Wade is a 63 y.o. female    [id number]  [id number] LOCATION:  FACILITY: [facility]  PHYSICIAN: Olivia Bihari, MD, [facility] Oct 08, 1950   DATE OF PROCEDURE:  09/10/2013    CARDIAC CATHETERIZATION/PERCUTANEOUS CORONARY INTERVENTION     HISTORY:    Ms Olivia Wade is a 63 year old female long-standing history of hypertension, hyperlipidemia, tobacco use, COPD, and documented abdominal aortic aneurysm. She presented to: Hospital on 09/08/2013 with an acute coronary syndrome where she had transient acute anterior T waves with ST elevation which did improve with nitroglycerin. Cardiac catheterization revealed a subtotal proximal LAD stenosis and she underwent successful angiosculpt Cutting Balloon and DES stenting of the LAD with a 3.0x23 mm Xience Alpine stent postdilated to 3.2 mm. She also had a high grade at least 90% napkin ringlike fibrotic stenosis in the proximal RCA. Her troponin did increase following her intervention to a peak of 4.78. The patient presents now for intervention to this high grade proximal RCA stenosis.   PROCEDURE:  The patient was brought to the second floor [facility]iac cath lab in the postabsorptive state. She was premedicated initially with Versed 2 mg and fentanyl 50 mcg. The right femoral artery puncture site from several days ago was used and a 6 [place] sheath was inserted without difficulty. Initial coronary angiography in the left artery system was performed using a 5 French diagnostic JL4 catheter which demonstrated a continued excellent angiographic result at the stented segment arising from the ostium to the proximal segment before the first diagonal branch. There is no change in the 20 and 40% narrowings beyond the stent. The circumflex was free of significant disease. Attention was then directed at the RCA. A 6 [place] FR4 guiding cathether was inserted. Angiography which confirmed the 90% fibrotic napkin ring like stenosis followed by a 50% fibrotic  stenosis of the proximal RCA. Angiomax bolus plus infusion was a Optician, dispensing. The patient received her evening dose of Brilinta 90 mg in the laboratory. The coronary wire was advanced down the RCA. Predilatation with a 2.5x12 mm Trac balloon was done at 8 and 10 atmospheres. A Xiience Alpine 3.0x15 mm DES stent was deployed at 12 and 13 atmospheres. A 3.25x12 mm [place] Euphora balloon was used for post stent dilatation at 13 and 15 atmospheres corresponding to 3.26 mm. Scout angiography confirmed an excellent angiographic result. The patient left the catheterization laboratory chest pain-free with stable hemodynamics.   HEMODYNAMICS:   Central Aorta: 112/75   ANGIOGRAPHY:  1. Left main: Angiographically normal and bifurcated into the LAD and left circumflex vessel. 2. LAD: Widely patent stent commencing at the ostium and extending to the proximal LAD before the first diagonal branch. There was mild 20% narrowing in the region of the first diagonal and 40% mid LAD stenosis. 3. Left circumflex: Angiographically normal 4. Right coronary artery: Large caliber vessel with a greater than 90% "napkin ring" like stenosis in the proximal segment followed by 50% narrowing.  Following successful percutaneous coronary intervention to the RCA with PTCA, DES stenting with a science Alpine 3.0x15 mm stent postdilated to 3.26 mm, the entire proximal stenosis region was reduced to 0%. There was brisk TIMI-3 flow. There was no evidence for dissection.   IMPRESSION:  Widely patent LAD stent extending from the ostium to the proximal LAD with mild 20% narrowing in the region the first diagonal takeoff followed by 40% mid LAD stenosis  Normal left circumflex coronary artery  High-grade 90% "napkin ring "like fibrotic stenosis in the  proximal RCA followed by 50% fibrotic stenosis.  Successful PCI to the proximal RCA with insertion of a 3.0x15 mm Xience Alpine DES stent postdilated to 3.26 mm with the stenoses being  reduced to 0%.  Olivia Biharihomas A. Ceana Fiala, MD, [facility] 09/10/2013 7:29 PM

## 2013-09-10 NOTE — Interval H&P Note (Signed)
History and Physical Interval Note:  09/10/2013 6:16 PM  Sabas Sous  has presented today for surgery, with the diagnosis of CP  The various methods of treatment have been discussed with the patient and family. After consideration of risks, benefits and other options for treatment, the patient has consented to  Procedure(s): PERCUTANEOUS CORONARY STENT INTERVENTION (PCI-S) (N/A)  to high grade RCA Cath Lab Visit (complete for each Cath Lab visit)  Clinical Evaluation Leading to the Procedure:   ACS: yes  Non-ACS:    Anginal Classification: CCS III  Anti-ischemic medical therapy: Maximal Therapy (2 or more classes of medications)  Non-Invasive Test Results: No non-invasive testing performed  Prior CABG: No previous CABG       stenosis as a surgical intervention .  The patient's history has been reviewed, patient examined, no change in status, stable for surgery.  I have reviewed the patient's chart and labs.  Questions were answered to the patient's satisfaction.     KELLY,THOMAS A

## 2013-09-10 NOTE — H&P (View-Only) (Signed)
Subjective:  Pt doing well s/p cath POD #1, denies current CP  Objective:  Vital Signs in the last 24 hours: Temp:  [97.7 F (36.5 C)-99 F (37.2 C)] 98.7 F (37.1 C) (04/02 0800) Pulse Rate:  [102-112] 112 (04/02 1000) Resp:  [19-28] 28 (04/02 1000) BP: (96-122)/(67-93) 110/79 mmHg (04/02 1000) SpO2:  [97 %-100 %] 98 % (04/02 1000) Weight:  [141 lb 15.6 oz (64.4 kg)] 141 lb 15.6 oz (64.4 kg) (04/02 0411)  Intake/Output from previous day: 04/01 0701 - 04/02 0700 In: 2348.1 [I.V.:2348.1] Out: 4600 [Urine:4600] Intake/Output from this shift: Total I/O In: 10.8 [I.V.:10.8] Out: -   Physical Exam: General appearance: alert, cooperative and appears stated age Neck: no JVD Lungs: clear to auscultation bilaterally Heart: regular rate and rhythm and no murmurs appreciated Abdomen: soft, non-tender; bowel sounds normal; no masses,  no organomegaly Extremities: extremities normal, atraumatic, no cyanosis or edema and cath site c/d/i Pulses: 2+ and symmetric Skin: Skin color, texture, turgor normal. No rashes or lesions Neurologic: Grossly normal  Lab Results:  Recent Labs  09/09/13 0055 09/10/13 0305  WBC 11.3* 10.3  HGB 12.5 12.9  PLT 254 256    Recent Labs  09/08/13 1055 09/09/13 0055  NA 139 138  K 3.8 3.8  CL 103 101  CO2 22 20  GLUCOSE 98 121*  BUN 9 6  CREATININE 0.82 0.70    Recent Labs  09/09/13 0046 09/09/13 0550  TROPONINI 3.86* 1.98*   Hepatic Function Panel No results found for this basename: PROT, ALBUMIN, AST, ALT, ALKPHOS, BILITOT, BILIDIR, IBILI,  in the last 72 hours  Recent Labs  09/09/13 0055  CHOL 140   No results found for this basename: PROTIME,  in the last 72 hours   Cardiac Studies: 09/08/13 ANGIOGRAPHY:  1. Left main: Angiographically normal and bifurcated into the LAD and left circumflex coronary arteries.  2. LAD: Size vessel that gave rise to a diagonal vessel immediately which was like an optimal diagonal vessel. Just  beyond this was 50% narrowing proximal to the first septal perforating artery. The LAD was then irregularly narrowed until a greater than 95% eccentric focal stenosis in the proximal segment. There was 40-50% mid LAD narrowing. The LAD extended to the apex.  3. Left circumflex: Angiographically normal vessel which gave rise to 2 small marginal branches.  4. Right coronary artery: Large vessel that appeared to have a very focal "apple core" stenosis of 85-90% in the very proximal segment followed by 50% stenosis. There was 30-40% narrowing in the mid RCA  Assessment/Plan:  NSTEMI s/p PCI POD #1 HTN AAA Hyperlipidemia Asthma COPD   Plan:  Pt with DES to LAD, continue with heparin gtt, brilinta and ASA, lisinopril, metoprolol, statin, nitro gtt Plan for repeat cath today, possible DES to RCA  Hgb and creatinine stable this AM, repeat  tomorrow  F/U on TSH and A1C   LOS: 2 days    Olivia Imler S. 09/10/2013, 11:49 AM      

## 2013-09-10 NOTE — Progress Notes (Signed)
Subjective:  Pt doing well s/p cath POD #1, denies current CP  Objective:  Vital Signs in the last 24 hours: Temp:  [97.7 F (36.5 C)-99 F (37.2 C)] 98.7 F (37.1 C) (04/02 0800) Pulse Rate:  [102-112] 112 (04/02 1000) Resp:  [19-28] 28 (04/02 1000) BP: (96-122)/(67-93) 110/79 mmHg (04/02 1000) SpO2:  [97 %-100 %] 98 % (04/02 1000) Weight:  [141 lb 15.6 oz (64.4 kg)] 141 lb 15.6 oz (64.4 kg) (04/02 0411)  Intake/Output from previous day: 04/01 0701 - 04/02 0700 In: 2348.1 [I.V.:2348.1] Out: 4600 [Urine:4600] Intake/Output from this shift: Total I/O In: 10.8 [I.V.:10.8] Out: -   Physical Exam: General appearance: alert, cooperative and appears stated age Neck: no JVD Lungs: clear to auscultation bilaterally Heart: regular rate and rhythm and no murmurs appreciated Abdomen: soft, non-tender; bowel sounds normal; no masses,  no organomegaly Extremities: extremities normal, atraumatic, no cyanosis or edema and cath site c/d/i Pulses: 2+ and symmetric Skin: Skin color, texture, turgor normal. No rashes or lesions Neurologic: Grossly normal  Lab Results:  Recent Labs  09/09/13 0055 09/10/13 0305  WBC 11.3* 10.3  HGB 12.5 12.9  PLT 254 256    Recent Labs  09/08/13 1055 09/09/13 0055  NA 139 138  K 3.8 3.8  CL 103 101  CO2 22 20  GLUCOSE 98 121*  BUN 9 6  CREATININE 0.82 0.70    Recent Labs  09/09/13 0046 09/09/13 0550  TROPONINI 3.86* 1.98*   Hepatic Function Panel No results found for this basename: PROT, ALBUMIN, AST, ALT, ALKPHOS, BILITOT, BILIDIR, IBILI,  in the last 72 hours  Recent Labs  09/09/13 0055  CHOL 140   No results found for this basename: PROTIME,  in the last 72 hours   Cardiac Studies: 09/08/13 ANGIOGRAPHY:  1. Left main: Angiographically normal and bifurcated into the LAD and left circumflex coronary arteries.  2. LAD: Size vessel that gave rise to a diagonal vessel immediately which was like an optimal diagonal vessel. Just  beyond this was 50% narrowing proximal to the first septal perforating artery. The LAD was then irregularly narrowed until a greater than 95% eccentric focal stenosis in the proximal segment. There was 40-50% mid LAD narrowing. The LAD extended to the apex.  3. Left circumflex: Angiographically normal vessel which gave rise to 2 small marginal branches.  4. Right coronary artery: Large vessel that appeared to have a very focal "apple core" stenosis of 85-90% in the very proximal segment followed by 50% stenosis. There was 30-40% narrowing in the mid RCA  Assessment/Plan:  NSTEMI s/p PCI POD #1 HTN AAA Hyperlipidemia Asthma COPD   Plan:  Pt with DES to LAD, continue with heparin gtt, brilinta and ASA, lisinopril, metoprolol, statin, nitro gtt Plan for repeat cath today, possible DES to RCA  Hgb and creatinine stable this AM, repeat  tomorrow  F/U on TSH and A1C   LOS: 2 days    VARANASI,JAYADEEP S. 09/10/2013, 11:49 AM

## 2013-09-11 DIAGNOSIS — I2510 Atherosclerotic heart disease of native coronary artery without angina pectoris: Secondary | ICD-10-CM

## 2013-09-11 LAB — CBC
HEMATOCRIT: 33.6 % — AB (ref 36.0–46.0)
HEMOGLOBIN: 11.3 g/dL — AB (ref 12.0–15.0)
MCH: 32.5 pg (ref 26.0–34.0)
MCHC: 33.6 g/dL (ref 30.0–36.0)
MCV: 96.6 fL (ref 78.0–100.0)
Platelets: 220 10*3/uL (ref 150–400)
RBC: 3.48 MIL/uL — ABNORMAL LOW (ref 3.87–5.11)
RDW: 13.1 % (ref 11.5–15.5)
WBC: 9.9 10*3/uL (ref 4.0–10.5)

## 2013-09-11 LAB — BASIC METABOLIC PANEL
BUN: 6 mg/dL (ref 6–23)
CO2: 21 mEq/L (ref 19–32)
CREATININE: 0.66 mg/dL (ref 0.50–1.10)
Calcium: 8.4 mg/dL (ref 8.4–10.5)
Chloride: 105 mEq/L (ref 96–112)
GLUCOSE: 105 mg/dL — AB (ref 70–99)
Potassium: 3.2 mEq/L — ABNORMAL LOW (ref 3.7–5.3)
Sodium: 140 mEq/L (ref 137–147)

## 2013-09-11 MED ORDER — NITROGLYCERIN 0.4 MG SL SUBL: 0.4000 mg | SUBLINGUAL_TABLET | SUBLINGUAL | Status: DC | PRN

## 2013-09-11 MED ORDER — ATORVASTATIN CALCIUM 80 MG PO TABS: 80.0000 mg | ORAL_TABLET | Freq: Every day | ORAL | Status: DC

## 2013-09-11 MED ORDER — TICAGRELOR 90 MG PO TABS: 90.0000 mg | ORAL_TABLET | Freq: Two times a day (BID) | ORAL | Status: DC

## 2013-09-11 MED ORDER — POTASSIUM CHLORIDE CRYS ER 20 MEQ PO TBCR
40.0000 meq | EXTENDED_RELEASE_TABLET | Freq: Once | ORAL | Status: AC
  Administered 2013-09-11: 40 meq via ORAL
  Filled 2013-09-11: qty 2

## 2013-09-11 MED FILL — Sodium Chloride IV Soln 0.9%: INTRAVENOUS | Qty: 50 | Status: AC

## 2013-09-11 NOTE — Progress Notes (Signed)
Reviewed discharge instructions with patient and family. Verbally understood. Iv removed and escorted out via wheelchair.   Cem Kosman, Charlaine Dalton RN

## 2013-09-11 NOTE — Progress Notes (Signed)
Subjective:  Pt doing well s/p cath w/ PCI Day 1 to RCA, denies CP, states she will quit smoking.   Objective:  Vital Signs in the last 24 hours: Temp:  [97.6 F (36.4 C)-98.7 F (37.1 C)] 98.3 F (36.8 C) (04/03 0831) Pulse Rate:  [65-112] 100 (04/03 0354) Resp:  [17-28] 22 (04/03 0831) BP: (99-140)/(79-107) 115/81 mmHg (04/03 0831) SpO2:  [92 %-100 %] 98 % (04/03 0831) Weight:  [142 lb 3.2 oz (64.5 kg)] 142 lb 3.2 oz (64.5 kg) (04/03 0354)  Intake/Output from previous day: 04/02 0701 - 04/03 0700 In: 1390.8 [I.V.:1390.8] Out: 550 [Urine:550] Intake/Output from this shift: Total I/O In: 160 [I.V.:160] Out: -   Physical Exam: General appearance: alert, cooperative and appears stated age Neck: no JVD Lungs: clear to auscultation bilaterally Heart: regular rate and rhythm and no murmurs appreciated Abdomen: soft, non-tender; bowel sounds normal; no masses,  no organomegaly Extremities: extremities normal, atraumatic, no cyanosis or edema and cath site c/d/i Pulses: 2+ and symmetric Skin: Skin color, texture, turgor normal. No rashes or lesions Neurologic: Grossly normal  Lab Results:  Recent Labs  09/10/13 0305 09/11/13 0324  WBC 10.3 9.9  HGB 12.9 11.3*  PLT 256 220    Recent Labs  09/09/13 0055 09/11/13 0324  NA 138 140  K 3.8 3.2*  CL 101 105  CO2 20 21  GLUCOSE 121* 105*  BUN 6 6  CREATININE 0.70 0.66    Recent Labs  09/09/13 0046 09/09/13 0550  TROPONINI 3.86* 1.98*   Hepatic Function Panel No results found for this basename: PROT, ALBUMIN, AST, ALT, ALKPHOS, BILITOT, BILIDIR, IBILI,  in the last 72 hours  Recent Labs  09/09/13 0055  CHOL 140   No results found for this basename: PROTIME,  in the last 72 hours   Cardiac Studies: 09/08/13 ANGIOGRAPHY:  1. Left main: Angiographically normal and bifurcated into the LAD and left circumflex coronary arteries.  2. LAD: Size vessel that gave rise to a diagonal vessel immediately which was like  an optimal diagonal vessel. Just beyond this was 50% narrowing proximal to the first septal perforating artery. The LAD was then irregularly narrowed until a greater than 95% eccentric focal stenosis in the proximal segment. There was 40-50% mid LAD narrowing. The LAD extended to the apex.  3. Left circumflex: Angiographically normal vessel which gave rise to 2 small marginal branches.  4. Right coronary artery: Large vessel that appeared to have a very focal "apple core" stenosis of 85-90% in the very proximal segment followed by 50% stenosis. There was 30-40% narrowing in the mid RCA  Assessment/Plan:  NSTEMI s/p PCI POD #1 to RCA, POD #3 for LAD HTN AAA Hyperlipidemia Asthma COPD   Plan:  Pt with DES to LAD and RCA, s/p PCI day 1 for RCA Continue brilinta and ASA, lisinopril, metoprolol, statin, nitro PRN Hgb and creatinine stable this AM F/U with Dr. Tresa Endo in two week Emphasized tobacco cessation Cardiac Rehab prior to discharge    LOS: 3 days    Gildardo Cranker 09/11/2013, 9:56 AM  I have examined the patient and reviewed assessment and plan and discussed with patient.  Agree with above as stated.   DAPT for at least a year and smoking cessation are two most important issues.  Palpable PT/DP pulses.  D/C today with f/u with Dr. Tresa Endo.  Phillipe Clemon S.

## 2013-09-11 NOTE — Progress Notes (Signed)
CARDIAC REHAB PHASE I   PRE:  Rate/Rhythm: 110 ST lying in bed    BP: sitting 115/93    SaO2:   MODE:  Ambulation: 170 ft   POST:  Rate/Rhythm: 131 ST    BP: sitting 80/54 (second attempt), 110/88 10 min later     SaO2:   Pt HR up lying in bed, pt animatedly talking with husband. C/o right groin pain upon standing. Used RW to walk, had limp entire walk.  HR increased to 131 ST with slow pace, short distance. BP seemingly low after walk in recliner. Pt sts she was slightly dizzy when asked. Sts she is normally dizzy when she is in pain (groin). Ed completed, reviewing NTG. Husband present. Pt struggles to retain information. 0511-0211  Elissa Lovett Flagler CES, ACSM 09/11/2013 10:48 AM

## 2013-09-11 NOTE — Discharge Summary (Addendum)
Physician Discharge Summary  Patient ID: Olivia Wade MRN: [id number] DOB/AGE: 1951-03-05 63 y.o.  Admit date: 09/08/2013 Discharge date: 09/11/2013  Admission Diagnoses: NSTEMI  Discharge Diagnoses:  Principal Problem:   NSTEMI (non-ST elevated myocardial infarction) Active Problems:   Abdominal aortic aneurysm   HTN (hypertension)   Hyperlipidemia   Asthma with COPD   Tobacco abuse   Unstable angina   ACS (acute coronary syndrome)   Discharged Condition: stable  Hospital Course: Olivia Wade is a 63 year old female, followed by Dr. Tresa EndoKelly, with a long-standing history of hypertension, hyperlipidemia, tobacco use, COPD, and documented abdominal aortic aneurysm. She [facility]2013 with an acute coronary syndrome, where she had transient acute anterior T waves with ST elevation which did improve with nitroglycerin. Cardiac catheterization, performed by Dr. Tresa EndoKelly,  revealed a subtotal proximal LAD stenosis and she underwent successful angiosculpt Cutting Balloon and DES stenting of the LAD with a 3.0x23 mm Xience Alpine stent postdilated to 3.2 mm. 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 Her troponin did increase following her LAD intervention to a peak of 4.78. It was decided to transport her back to the cath lab for intervention to this high grade proximal RCA stenosis. This was also performed by Dr. Tresa EndoKelly. She underwent successful PCI to the proximal RCA with 3.0x15 mm Xience Alpine DES stent postdilated to 3.26 mm with the stenoses being reduced to 0%. She tolerated the procedure well. LVEF was estimated at 45-50%. She left the cath lab in stable condition. She was started on DAPT with ASA + Brilinta. She was continue on Toprol and lisinopril and her Lipitor was increased to 80 mg daily. She had no further chest pain and the cath site remained stable. She had no difficulties ambulating, however she was noted to have  resting tachycardia into the 110s and SBP dropped in the the low 80s. She was given a bolus of IVFs and BP improved to 110 systolic. She remained tachycardic, but was otherwise asymptomatic. She was last seen and examined by Dr. Griffin DakinVaranai, who determined she was stable for discharge home. She will have early post hospital f/u on 09/14/13 to reassess her HR and BP. She will f/u initially with Robbie LisBrittainy Simmons, PA-C. She is also scheduled to see Dr. Tresa EndoKelly on 10/27/13.     Consults: None  Significant Diagnostic Studies:   [facility] 09/08/13 IMPRESSION:  Acute coronary syndrome secondary to subtotal proximal LAD stenosis with associated moderate mid distal anterolateral hypocontractility and initial ejection fraction of approximately 45-50%  Significant 2 vessel coronary obstructive disease with 50% very proximal LAD stenosis followed by 95% eccentric proximal LAD stenosis and 40-50% focal mid LAD stenosis; and significant 85-90% "apple core" stenosis of the proximal RCA followed by 50-60% narrowing with 30-40% mid RCA stenosis.  Moderate size infrarenal abdominal aortic aneurysm  Successful percutaneous coronary intervention with angioscope cutting balloon, PTCA, and DES stenting with a Xience Alpine 3.0x23 mm stent postdilated to 3.25 mm.   LHC 09/10/13 IMPRESSION:  Widely patent LAD stent extending from the ostium to the proximal LAD with mild 20% narrowing in the region the first diagonal takeoff followed by 40% mid LAD stenosis  Normal left circumflex coronary artery  High-grade 90% "napkin ring "like fibrotic stenosis in the proximal RCA followed by 50% fibrotic stenosis.  Successful PCI to the proximal RCA with insertion of a 3.0x15 mm Xience Alpine DES stent postdilated to 3.26 mm with the stenoses  being reduced to 0%.   Treatments: See Hospital Course  Discharge Exam: Blood pressure 110/81, pulse 100, temperature 99.2 F (37.3 C), temperature source Oral, resp. rate 29, height 5\' 4"  (1.626 m),  weight 142 lb 3.2 oz (64.5 kg), SpO2 99.00%.   Disposition: 01-Home or Self Care      Discharge Orders   Future Appointments Provider Department Dept Phone   09/14/2013 9:00 AM Brittainy Delmer [facility] [phone]   10/27/2013 9:00 AM Lennette Bihari, MD [facility] [phone]   Future Orders Complete By Expires   Amb Referral to Cardiac Rehabilitation  As directed    Diet - low sodium heart healthy  As directed    Driving Restrictions  As directed    Comments:     No driving until f/u appointment   Increase activity slowly  As directed    Lifting restrictions  As directed    Comments:     Refrain from heavy lifting until your follow-up appointment       Medication List    STOP taking these medications       traMADol 50 MG tablet  Commonly known as:  ULTRAM      TAKE these medications       albuterol 108 (90 BASE) MCG/ACT inhaler  Commonly known as:  PROVENTIL HFA;VENTOLIN HFA  Inhale 1 puff into the lungs every 6 (six) hours as needed for wheezing or shortness of breath.     albuterol (2.5 MG/3ML) 0.083% nebulizer solution  Commonly known as:  PROVENTIL  Take 2.5 mg by nebulization every 6 (six) hours as needed for wheezing or shortness of breath.     ALPRAZolam 0.5 MG tablet  Commonly known as:  XANAX  Take 0.5 mg by mouth 3 (three) times daily as needed for anxiety.     aspirin 81 MG tablet  Take 81 mg by mouth daily.     atorvastatin 80 MG tablet  Commonly known as:  LIPITOR  Take 1 tablet (80 mg total) by mouth daily.     citalopram 20 MG tablet  Commonly known as:  CELEXA  Take 20 mg by mouth daily.     cyclobenzaprine 10 MG tablet  Commonly known as:  FLEXERIL  Take 10 mg by mouth 3 (three) times daily as needed for muscle spasms.     lisinopril 10 MG tablet  Commonly known as:  PRINIVIL,ZESTRIL  Take 1 tablet (10 mg total) by mouth daily.     metoprolol succinate 25 MG 24 hr tablet  Commonly known as:   TOPROL-XL  Take 25 mg by mouth daily.     montelukast 10 MG tablet  Commonly known as:  SINGULAIR  Take 10 mg by mouth at bedtime.     nitroGLYCERIN 0.4 MG SL tablet  Commonly known as:  NITROSTAT  Place 1 tablet (0.4 mg total) under the tongue every 5 (five) minutes as needed for chest pain.     pantoprazole 40 MG tablet  Commonly known as:  PROTONIX  Take 40 mg by mouth daily.     Ticagrelor 90 MG Tabs tablet  Commonly known as:  BRILINTA  Take 1 tablet (90 mg total) by mouth 2 (two) times daily.     Ticagrelor 90 MG Tabs tablet  Commonly known as:  BRILINTA  Take 1 tablet (90 mg total) by mouth 2 (two) times daily.       Follow-up Information   Follow up with Robbie Lis, PA-C  On 09/14/2013. (with Dr. Landry Dyke PA at 9:00am)    Specialty:  Cardiology   Contact information:   [address] [place] [address] [phone]       Follow up with Lennette Bihari, MD On 10/27/2013. (9:00 am )    Specialty:  Cardiology   Contact information:   [address] [place] [address] [phone]     TIME SPENT ON DISCHARGE, INCLUDING PHYSICIAN TIME: 40 MINUTES going over plan and assessing tachycardia  Signed: Robbie Lis 09/11/2013, 2:32 PM  I have examined the patient and reviewed assessment and plan and discussed with patient.  Agree with above as stated.  Close f/u. Tachycardia likely related to anemia.  Continue beta blocker.  Janneth Krasner S.

## 2013-09-13 ENCOUNTER — Other Ambulatory Visit: Payer: Self-pay

## 2013-09-13 ENCOUNTER — Encounter (HOSPITAL_COMMUNITY): Payer: Self-pay | Admitting: Emergency Medicine

## 2013-09-13 ENCOUNTER — Emergency Department (HOSPITAL_COMMUNITY)
Admission: EM | Admit: 2013-09-13 | Discharge: 2013-09-14 | Disposition: A | Discharge status: Home / Self Care | Payer: BC Managed Care – PPO | Attending: Emergency Medicine | Admitting: Emergency Medicine

## 2013-09-13 DIAGNOSIS — M545 Low back pain, unspecified: Secondary | ICD-10-CM | POA: Insufficient documentation

## 2013-09-13 DIAGNOSIS — K219 Gastro-esophageal reflux disease without esophagitis: Secondary | ICD-10-CM | POA: Insufficient documentation

## 2013-09-13 DIAGNOSIS — I714 Abdominal aortic aneurysm, without rupture, unspecified: Secondary | ICD-10-CM | POA: Insufficient documentation

## 2013-09-13 DIAGNOSIS — Z7982 Long term (current) use of aspirin: Secondary | ICD-10-CM | POA: Insufficient documentation

## 2013-09-13 DIAGNOSIS — Z87891 Personal history of nicotine dependence: Secondary | ICD-10-CM | POA: Insufficient documentation

## 2013-09-13 DIAGNOSIS — J4489 Other specified chronic obstructive pulmonary disease: Secondary | ICD-10-CM | POA: Insufficient documentation

## 2013-09-13 DIAGNOSIS — Z880 Allergy status to penicillin: Secondary | ICD-10-CM | POA: Insufficient documentation

## 2013-09-13 DIAGNOSIS — Z885 Allergy status to narcotic agent status: Secondary | ICD-10-CM | POA: Insufficient documentation

## 2013-09-13 DIAGNOSIS — R079 Chest pain, unspecified: Secondary | ICD-10-CM | POA: Insufficient documentation

## 2013-09-13 DIAGNOSIS — J449 Chronic obstructive pulmonary disease, unspecified: Secondary | ICD-10-CM | POA: Insufficient documentation

## 2013-09-13 DIAGNOSIS — G8929 Other chronic pain: Secondary | ICD-10-CM | POA: Insufficient documentation

## 2013-09-13 DIAGNOSIS — Z79899 Other long term (current) drug therapy: Secondary | ICD-10-CM | POA: Insufficient documentation

## 2013-09-13 DIAGNOSIS — E785 Hyperlipidemia, unspecified: Secondary | ICD-10-CM | POA: Insufficient documentation

## 2013-09-13 DIAGNOSIS — J45909 Unspecified asthma, uncomplicated: Secondary | ICD-10-CM | POA: Insufficient documentation

## 2013-09-13 DIAGNOSIS — I10 Essential (primary) hypertension: Secondary | ICD-10-CM | POA: Insufficient documentation

## 2013-09-13 LAB — I-STAT TROPONIN, ED: Troponin i, poc: 0.13 ng/mL (ref 0.00–0.08)

## 2013-09-13 LAB — CBC WITH DIFFERENTIAL/PLATELET
BASOS ABS: 0 10*3/uL (ref 0.0–0.1)
BASOS PCT: 0 % (ref 0–1)
Eosinophils Absolute: 0.2 10*3/uL (ref 0.0–0.7)
Eosinophils Relative: 2 % (ref 0–5)
HEMATOCRIT: 31.2 % — AB (ref 36.0–46.0)
Hemoglobin: 10.9 g/dL — ABNORMAL LOW (ref 12.0–15.0)
Lymphocytes Relative: 29 % (ref 12–46)
Lymphs Abs: 2.3 10*3/uL (ref 0.7–4.0)
MCH: 33 pg (ref 26.0–34.0)
MCHC: 34.9 g/dL (ref 30.0–36.0)
MCV: 94.5 fL (ref 78.0–100.0)
MONO ABS: 0.8 10*3/uL (ref 0.1–1.0)
Monocytes Relative: 10 % (ref 3–12)
NEUTROS ABS: 4.8 10*3/uL (ref 1.7–7.7)
NEUTROS PCT: 59 % (ref 43–77)
Platelets: 206 10*3/uL (ref 150–400)
RBC: 3.3 MIL/uL — ABNORMAL LOW (ref 3.87–5.11)
RDW: 12.6 % (ref 11.5–15.5)
WBC: 8 10*3/uL (ref 4.0–10.5)

## 2013-09-13 LAB — BASIC METABOLIC PANEL
BUN: 7 mg/dL (ref 6–23)
CHLORIDE: 100 meq/L (ref 96–112)
CO2: 21 mEq/L (ref 19–32)
CREATININE: 0.77 mg/dL (ref 0.50–1.10)
Calcium: 8.5 mg/dL (ref 8.4–10.5)
GFR, EST NON AFRICAN AMERICAN: 89 mL/min — AB (ref >90)
Glucose, Bld: 101 mg/dL — ABNORMAL HIGH (ref 70–99)
Potassium: 3 mEq/L — ABNORMAL LOW (ref 3.7–5.3)
Sodium: 138 mEq/L (ref 137–147)

## 2013-09-13 NOTE — ED Notes (Signed)
Pt states she does not want any other test she just wants to go home

## 2013-09-13 NOTE — ED Notes (Signed)
Old and New EKG given to Dr  

## 2013-09-13 NOTE — Discharge Instructions (Signed)
Chest Pain (Nonspecific) °It is often hard to give a specific diagnosis for the cause of chest pain. There is always a chance that your pain could be related to something serious, such as a heart attack or a blood clot in the lungs. You need to follow up with your caregiver for further evaluation. °CAUSES  °· Heartburn. °· Pneumonia or bronchitis. °· Anxiety or stress. °· Inflammation around your heart (pericarditis) or lung (pleuritis or pleurisy). °· A blood clot in the lung. °· A collapsed lung (pneumothorax). It can develop suddenly on its own (spontaneous pneumothorax) or from injury (trauma) to the chest. °· Shingles infection (herpes zoster virus). °The chest wall is composed of bones, muscles, and cartilage. Any of these can be the source of the pain. °· The bones can be bruised by injury. °· The muscles or cartilage can be strained by coughing or overwork. °· The cartilage can be affected by inflammation and become sore (costochondritis). °DIAGNOSIS  °Lab tests or other studies, such as X-rays, electrocardiography, stress testing, or cardiac imaging, may be needed to find the cause of your pain.  °TREATMENT  °· Treatment depends on what may be causing your chest pain. Treatment may include: °· Acid blockers for heartburn. °· Anti-inflammatory medicine. °· Pain medicine for inflammatory conditions. °· Antibiotics if an infection is present. °· You may be advised to change lifestyle habits. This includes stopping smoking and avoiding alcohol, caffeine, and chocolate. °· You may be advised to keep your head raised (elevated) when sleeping. This reduces the chance of acid going backward from your stomach into your esophagus. °· Most of the time, nonspecific chest pain will improve within 2 to 3 days with rest and mild pain medicine. °HOME CARE INSTRUCTIONS  °· If antibiotics were prescribed, take your antibiotics as directed. Finish them even if you start to feel better. °· For the next few days, avoid physical  activities that bring on chest pain. Continue physical activities as directed. °· Do not smoke. °· Avoid drinking alcohol. °· Only take over-the-counter or prescription medicine for pain, discomfort, or fever as directed by your caregiver. °· Follow your caregiver's suggestions for further testing if your chest pain does not go away. °· Keep any follow-up appointments you made. If you do not go to an appointment, you could develop lasting (chronic) problems with pain. If there is any problem keeping an appointment, you must call to reschedule. °SEEK MEDICAL CARE IF:  °· You think you are having problems from the medicine you are taking. Read your medicine instructions carefully. °· Your chest pain does not go away, even after treatment. °· You develop a rash with blisters on your chest. °SEEK IMMEDIATE MEDICAL CARE IF:  °· You have increased chest pain or pain that spreads to your arm, neck, jaw, back, or abdomen. °· You develop shortness of breath, an increasing cough, or you are coughing up blood. °· You have severe back or abdominal pain, feel nauseous, or vomit. °· You develop severe weakness, fainting, or chills. °· You have a fever. °THIS IS AN EMERGENCY. Do not wait to see if the pain will go away. Get medical help at once. Call your local emergency services (911 in [place] Do not drive yourself to the hospital. °MAKE SURE YOU:  °· Understand these instructions. °· Will watch your condition. °· Will get help right away if you are not doing well or get worse. °Document Released: 03/07/2005 Document Revised: 08/20/2011 Document Reviewed: 01/01/2008 °[employer]® Patient Information ©2014 [employer]   [employer]. ° °

## 2013-09-13 NOTE — ED Provider Notes (Signed)
I saw and evaluated the patient, reviewed the resident's note and I agree with the findings and plan.    Date: 09/13/2013 21:51  Rate: 98  Rhythm: normal sinus rhythm  QRS Axis: normal  Intervals: normal  ST/T Wave abnormalities: normal  Conduction Disutrbances: none  Narrative Interpretation: unremarkable; no significant change from last tracing  Pt is a 63 y.o. female with a history of hyperlipidemia, hypertension who recently had a drug-eluting stent placed to her RCA by Dr. Allyson Sabal on 09/10/13 who presents emergency department with a 10 minute episode of "pinching" central chest pain that started while at rest. She took one nitroglycerin at home with no relief of her pain.  She states she felt that her pain slowly resolved on exam. She has had shortness breath and nausea since her cardiac catheterization but this did not increase with this episode of chest pain. No diaphoresis or dizziness. She is currently asymptomatic. Heart and lung sounds normal. Abdomen soft nontender. Equal radial and femoral pulses bilaterally. We'll obtain cardiac workup. EKG shows no ischemic changes. She states this pain does not feel like the same pain she had when she had her NSTEMI.  She has received aspirin. We'll discuss with patient's cardiologist for disposition.      Layla Maw Tiajah Oyster, DO 09/13/13 2218

## 2013-09-13 NOTE — ED Provider Notes (Signed)
CSN: [id number]     Arrival date & time 09/13/13  2142 History   First MD Initiated Contact with Patient 09/13/13 2159     Chief Complaint  Patient presents with  . Chest Pain   Patient is a 63 y.o. female presenting with chest pain.  Chest Pain Pain location:  Substernal area Pain quality: sharp   Pain quality comment:  "pinching" Pain radiates to:  Does not radiate Pain radiates to the back: no   Pain severity:  Mild Onset quality:  Sudden Duration:  10 minutes Timing:  Constant Progression:  Resolved (resolved spontaneously with no intervention) Chronicity:  New Context: at rest   Relieved by:  Nothing Worsened by:  Nothing tried Ineffective treatments:  None tried (She took 325 mg ASA; she was given SL NTG by EMS, but her pain had already resolved) Associated symptoms: no abdominal pain, no anorexia, no back pain, no cough, no diaphoresis, no fatigue, no fever, no headache, no nausea, no near-syncope, no numbness, no orthopnea, no PND, no shortness of breath, not vomiting and no weakness   Risk factors: coronary artery disease, high cholesterol, hypertension and smoking   Risk factors: no aortic disease, no diabetes mellitus and no prior DVT/PE   Risk factors comment:  MI, cath 3 days ago.   She had NSTEMI 3 days ago, cath performed by Dr. Tresa Endo, with DES X 2 to LAD and RCA.     Past Medical History  Diagnosis Date  . AAA (abdominal aortic aneurysm)   . Tobacco abuse   . HTN (hypertension)   . COPD (chronic obstructive pulmonary disease)   . Asthma   . HLD (hyperlipidemia)   . GERD (gastroesophageal reflux disease)   . Chronic low back pain     (Old vertebral fracture)   History reviewed. No pertinent past surgical history. Family History  Problem Relation Age of Onset  . Heart attack Father   . Heart attack Son 7  . Cancer Father     Esophageal  . Hypertension Mother    History  Substance Use Topics  . Smoking status: Current Every Day Smoker -- 0.50  packs/day for 37 years    Types: Cigarettes  . Smokeless tobacco: Not on file  . Alcohol Use: No   OB History   Grav Para Term Preterm Abortions TAB SAB Ect Mult Living                 Review of Systems  Constitutional: Negative for fever, chills, diaphoresis and fatigue.  HENT: Negative for congestion and rhinorrhea.   Respiratory: Negative for cough, shortness of breath and wheezing.   Cardiovascular: Positive for chest pain. Negative for orthopnea, leg swelling, PND and near-syncope.  Gastrointestinal: Negative for nausea, vomiting, abdominal pain, diarrhea and anorexia.  Genitourinary: Negative for dysuria, urgency, frequency, flank pain, vaginal bleeding, vaginal discharge and difficulty urinating.  Musculoskeletal: Negative for back pain, neck pain and neck stiffness.  Skin: Negative for rash.  Neurological: Negative for weakness, numbness and headaches.  All other systems reviewed and are negative.      Allergies  Bee venom; Penicillins; and Morphine and related  Home Medications   Current Outpatient Rx  Name  Route  Sig  Dispense  Refill  . acetaminophen (TYLENOL) 325 MG tablet   Oral   Take 325 mg by mouth every 6 (six) hours as needed for moderate pain.         Marland Kitchen albuterol (PROVENTIL HFA;VENTOLIN HFA) 108 (90 BASE) MCG/ACT  inhaler   Inhalation   Inhale 1 puff into the lungs every 6 (six) hours as needed for wheezing or shortness of breath.         Marland Kitchen. albuterol (PROVENTIL) (2.5 MG/3ML) 0.083% nebulizer solution   Nebulization   Take 2.5 mg by nebulization every 6 (six) hours as needed for wheezing or shortness of breath.         . ALPRAZolam (XANAX) 0.5 MG tablet   Oral   Take 0.5 mg by mouth 3 (three) times daily as needed for anxiety.         Marland Kitchen. aspirin 81 MG tablet   Oral   Take 81 mg by mouth daily.         Marland Kitchen. atorvastatin (LIPITOR) 80 MG tablet   Oral   Take 1 tablet (80 mg total) by mouth daily.   30 tablet   11   . citalopram (CELEXA)  20 MG tablet   Oral   Take 20 mg by mouth daily.         . cyclobenzaprine (FLEXERIL) 10 MG tablet   Oral   Take 10 mg by mouth 3 (three) times daily as needed for muscle spasms.         Marland Kitchen. lisinopril (PRINIVIL,ZESTRIL) 10 MG tablet   Oral   Take 1 tablet (10 mg total) by mouth daily.   30 tablet   9   . Menthol, Topical Analgesic, (ICY HOT) 7.5 % (ROLL) MISC   Apply externally   Apply 1 each topically as needed (for back pain).         . metoprolol succinate (TOPROL-XL) 25 MG 24 hr tablet   Oral   Take 25 mg by mouth daily.         . montelukast (SINGULAIR) 10 MG tablet   Oral   Take 10 mg by mouth at bedtime.         . nicotine (NICODERM CQ - DOSED IN MG/24 HOURS) 21 mg/24hr patch   Transdermal   Place 21 mg onto the skin daily.         . nitroGLYCERIN (NITROSTAT) 0.4 MG SL tablet   Sublingual   Place 1 tablet (0.4 mg total) under the tongue every 5 (five) minutes as needed for chest pain.   25 tablet   2   . pantoprazole (PROTONIX) 40 MG tablet   Oral   Take 40 mg by mouth daily.         . Ticagrelor (BRILINTA) 90 MG TABS tablet   Oral   Take 1 tablet (90 mg total) by mouth 2 (two) times daily.   60 tablet   10    BP 103/73  Pulse 99  Temp(Src) 98 F (36.7 C) (Oral)  Resp 26  SpO2 94% Physical Exam  Nursing note and vitals reviewed. Constitutional: She is oriented to person, place, and time. She appears well-developed and well-nourished. No distress.  HENT:  Head: Normocephalic and atraumatic.  Mouth/Throat: Oropharynx is clear and moist.  Eyes: Conjunctivae and EOM are normal. Pupils are equal, round, and reactive to light. No scleral icterus.  Neck: Normal range of motion. Neck supple. No JVD present.  Cardiovascular: Normal rate, regular rhythm, normal heart sounds and intact distal pulses.  Exam reveals no gallop and no friction rub.   No murmur heard. Pulmonary/Chest: Effort normal and breath sounds normal. No respiratory distress.  She has no wheezes. She has no rales.  Abdominal: Soft. Bowel sounds are normal. She exhibits  no distension. There is no tenderness. There is no rebound and no guarding.  Musculoskeletal: She exhibits no edema.  Neurological: She is alert and oriented to person, place, and time. No cranial nerve deficit. She exhibits normal muscle tone. Coordination normal.  Skin: Skin is warm and dry. She is not diaphoretic.    ED Course  Procedures (including critical care time) Labs Review Labs Reviewed  CBC WITH DIFFERENTIAL - Abnormal; Notable for the following:    RBC 3.30 (*)    Hemoglobin 10.9 (*)    HCT 31.2 (*)    All other components within normal limits  BASIC METABOLIC PANEL - Abnormal; Notable for the following:    Potassium 3.0 (*)    Glucose, Bld 101 (*)    GFR calc non Af Amer 89 (*)    All other components within normal limits  I-STAT TROPOININ, ED - Abnormal; Notable for the following:    Troponin i, poc 0.13 (*)    All other components within normal limits  CK TOTAL AND CKMB   Imaging Review No results found.   MDM   63 year old female presents complaining of chest pain. She had an MI, instability, status post DES to LAD and RCA 3 days ago.  Chest pain started less than an hour prior to arrival. Was located to the middle of her chest. It was "pinching", sharp in nature. It lasted for 10 minutes. It resolved spontaneously. It was not associated with any radiation, shortness of breath, nausea, vomiting, diaphoresis, or any other symptoms.  EMS gave her one nitroglycerin, although pain had already subsided. She also took 325 mg of aspirin at home.  Her EKG is nonischemic, unchanged from prior. Troponin negative x1.  Patient remained pain-free throughout her ED course. I spoke with cardiologist on call. He asked that we obtain CK and CK-MB. However when the nurse went to draw this lab work, patient refused. She stated she does not want to stay in the emergency room anymore and  she wants to go home.  Point bedside and discussed this with the patient twice. She is alert, oriented x4, normal thought process. She is competent to make medical decisions for herself. I explained to her that in my is not excluded. I explained to her that discharge  without completing evaluation places her at risk for heart attack, heart failure, and death. Patient expresses complete understanding. She's able to explain in her own words the risks associated with leaving against medical device. She signed out AGAINST MEDICAL ADVICE. She does have a followup appointment with her cardiologist tomorrow morning. I encouraged her to keep this appointment. She states she will. I are sure to return immediately to the emergency department for recurrent pain or any other worrisome symptoms. I advised her that she may return at anytime.  Final diagnoses:  Chest pain       Toney Sang, MD 09/13/13 2358

## 2013-09-13 NOTE — ED Notes (Signed)
Pippin MD at bedside. 

## 2013-09-13 NOTE — ED Notes (Signed)
Pt signed out AMA. Pippin MD informed pt of their risk should they leave, letting know they could suffer MI and it could lead to death.

## 2013-09-13 NOTE — ED Notes (Signed)
Per EMS, pt was sitting at home watching TV with sudden onset chest pain. Pt. Took 324 ASA and 1 nitro. Pain subsided. Currently denies pain. Hx of MI x1 week ago with stent placement

## 2013-09-14 ENCOUNTER — Ambulatory Visit (INDEPENDENT_AMBULATORY_CARE_PROVIDER_SITE_OTHER): Payer: BC Managed Care – PPO | Admitting: Cardiology

## 2013-09-14 ENCOUNTER — Encounter: Payer: Self-pay | Admitting: Cardiology

## 2013-09-14 ENCOUNTER — Other Ambulatory Visit: Payer: Self-pay | Admitting: *Deleted

## 2013-09-14 VITALS — BP 100/78 | HR 104 | Ht 64.0 in | Wt 144.0 lb

## 2013-09-14 DIAGNOSIS — E785 Hyperlipidemia, unspecified: Secondary | ICD-10-CM

## 2013-09-14 DIAGNOSIS — I2510 Atherosclerotic heart disease of native coronary artery without angina pectoris: Secondary | ICD-10-CM

## 2013-09-14 DIAGNOSIS — I10 Essential (primary) hypertension: Secondary | ICD-10-CM

## 2013-09-14 LAB — CK TOTAL AND CKMB (NOT AT ARMC)
CK, MB: 3.5 ng/mL (ref 0.3–4.0)
Relative Index: 3.1 — ABNORMAL HIGH (ref 0.0–2.5)
Total CK: 113 U/L (ref 7–177)

## 2013-09-14 MED ORDER — METOPROLOL SUCCINATE ER 50 MG PO TB24: 50.0000 mg | ORAL_TABLET | Freq: Every day | ORAL | Status: DC

## 2013-09-14 MED ORDER — LISINOPRIL 5 MG PO TABS: 5.0000 mg | ORAL_TABLET | Freq: Every day | ORAL | Status: DC

## 2013-09-14 NOTE — Patient Instructions (Signed)
Your physician recommends that you schedule a follow-up appointment Keep Appointment with Dr Tresa Endo  Your physician has recommended you make the following change in your medication: Decrease lisinopril to 5 mg daily and increase metoprolol to 50 mg daily Nitroglycerin is to be taken every five minutes not every ten minutes

## 2013-09-14 NOTE — Telephone Encounter (Signed)
Rx refill denied per Dr.Kelly. Refer medication to PCP

## 2013-09-14 NOTE — Assessment & Plan Note (Addendum)
A bit soft at 100/78, but she is asymptomatic. We will decrease her Lisinopril to 5 mg, to allow for upward titration of her Toprol, in an effort for better rate control.

## 2013-09-14 NOTE — Assessment & Plan Note (Signed)
Continue high dose statin therapy with Lipitor.

## 2013-09-14 NOTE — Assessment & Plan Note (Signed)
Pt reports that she quit smoking. Pt was congratulated on her efforts and was encouraged to stay on tract.

## 2013-09-14 NOTE — Progress Notes (Signed)
Patient ID: [name], female   DOB: 10-08-1950, 63 y.o.   MRN: [id number]    09/14/2013 Olivia Wade   Jan 26, 1951  [id number]  Primary Physicia Olivia Conrad, MD Primary Cardiologist: Dr. Tresa Endo  Olivia Wade presents to clinic for post-hospital f/u, following an admission for NSTEMI. Details of her cardiac history and recent hospital course are outlined below.   HPI:  Olivia Wade is a 63 year old female, followed by Dr. Tresa Endo, with a long-standing history of hypertension, hyperlipidemia, tobacco use, COPD, and documented abdominal aortic aneurysm. She presented to [facility] on 09/08/2013 with an acute coronary syndrome, where she had transient acute anterior T waves with ST elevation which did improve with nitroglycerin. Cardiac catheterization, performed by Dr. Tresa Endo, revealed a subtotal proximal LAD stenosis and she underwent successful angiosculpt Cut[place] Balloon and DES stenting of the LAD with a 3.0x23 mm Xience Alpine stent postdilated to 3.2 mm. 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 Her troponin did increase following her LAD intervention to a peak of 4.78. It was decided to transport her back to the cath lab for intervention to this high grade proximal RCA stenosis. This was also performed by Dr. Tresa Endo. She underwent successful PCI to the proximal RCA with 3.0x15 mm Xience Alpine DES stent postdilated to 3.26 mm with the stenoses being reduced to 0%. She tolerated the procedure well. LVEF was estimated at 45-50%. She left the cath lab in stable condition. She was started on DAPT with ASA + Brilinta. She was continue on Toprol and lisinopril and her Lipitor was increased to 80 mg daily. She had no further chest pain and the cath site remained stable. She had no difficulties ambulating, however she was noted to have resting tachycardia into the 110s and SBP dropped in the the low 80s. She was given a bolus of IVFs and BP improved to 110 systolic.  She remained tachycardic, but was otherwise asymptomatic. She was last seen and examined by Dr. Griffin Dakin, who determined she was stable for discharge home. He recommended early post-hospital f/u, given her resting tachycardia.  She presents to clinic today for f/u. She is accompanied by her husband. She states that she has done fairly well since discharge. However, last PM she had a "twinge" of needle-like chest discomfort that lasted about 5-10 minutes. If was very different from her NSTEMI pain, which was a pressure/ burring like senstation. However, because it was chest pain, she decided to go to the ER for evalution. Her chest pain resolved spontaneously by the time she arrived and she had no further recurrence. She denied any other associated symptoms. In the ER, a tropoin was checked which was slightly elevated at 0.13. However, the ER physician felt that this was residual from her recent NSTEMI last week. The day of discharge, 3 days prior to being seen in ER, her troponin level was 0.30.   Today she states that she is doing well. And she denies any further chest pain issues. No SOB, dizziness, syncope or near syncope. She reports daily compliance with all of her medications, including her ASA and Brilinta. She states that she quit smoking after discharge.    Current Outpatient Prescriptions  Medication Sig Dispense Refill  . acetaminophen (TYLENOL) 325 MG tablet Take 325 mg by mouth every 6 (six) hours as needed for moderate pain.      Marland Kitchen albuterol (PROVENTIL HFA;VENTOLIN HFA) 108 (90 BASE) MCG/ACT inhaler Inhale 1 puff  into the lungs every 6 (six) hours as needed for wheezing or shortness of breath.      Marland Kitchen albuterol (PROVENTIL) (2.5 MG/3ML) 0.083% nebulizer solution Take 2.5 mg by nebulization every 6 (six) hours as needed for wheezing or shortness of breath.      . ALPRAZolam (XANAX) 0.5 MG tablet Take 0.5 mg by mouth 3 (three) times daily as needed for anxiety.      Marland Kitchen aspirin 81 MG tablet Take  81 mg by mouth daily.      Marland Kitchen atorvastatin (LIPITOR) 80 MG tablet Take 1 tablet (80 mg total) by mouth daily.  30 tablet  11  . citalopram (CELEXA) 20 MG tablet Take 20 mg by mouth daily.      . cyclobenzaprine (FLEXERIL) 10 MG tablet Take 10 mg by mouth 3 (three) times daily as needed for muscle spasms.      . Menthol, Topical Analgesic, (ICY HOT) 7.5 % (ROLL) MISC Apply 1 each topically as needed (for back pain).      . montelukast (SINGULAIR) 10 MG tablet Take 10 mg by mouth at bedtime.      . nicotine (NICODERM CQ - DOSED IN MG/24 HOURS) 21 mg/24hr patch Place 21 mg onto the skin daily.      . nitroGLYCERIN (NITROSTAT) 0.4 MG SL tablet Place 1 tablet (0.4 mg total) under the tongue every 5 (five) minutes as needed for chest pain.  25 tablet  2  . pantoprazole (PROTONIX) 40 MG tablet Take 40 mg by mouth daily.      . Ticagrelor (BRILINTA) 90 MG TABS tablet Take 1 tablet (90 mg total) by mouth 2 (two) times daily.  60 tablet  10   No current facility-administered medications for this visit.    Allergies  Allergen Reactions  . Bee Venom Anaphylaxis, Hives and Swelling  . Penicillins Other (See Comments)    PATIENT REPORTS "MOTHER TOLD HER SHE HAD ALLERGY TO PCN"  . Morphine And Related Rash    History   Social History  . Marital Status: Married    Spouse Name: N/A    Number of Children: N/A  . Years of Education: N/A   Occupational History  . Not on file.   Social History Main Topics  . Smoking status: Former Smoker -- 0.50 packs/day for 37 years    Types: Cigarettes    Quit date: 09/08/2013  . Smokeless tobacco: Not on file  . Alcohol Use: No  . Drug Use: No  . Sexual Activity: Not on file   Other Topics Concern  . Not on file   Social History Narrative  . No narrative on file     Review of Systems: General: negative for chills, fever, night sweats or weight changes.  Cardiovascular: negative for chest pain, dyspnea on exertion, edema, orthopnea, palpitations,  paroxysmal nocturnal dyspnea or shortness of breath Dermatological: negative for rash Respiratory: negative for cough or wheezing Urologic: negative for hematuria Abdominal: negative for nausea, vomiting, diarrhea, bright red blood per rectum, melena, or hematemesis Neurologic: negative for visual changes, syncope, or dizziness All other systems reviewed and are otherwise negative except as noted above.    Blood pressure 100/78, pulse 104, height 5\' 4"  (1.626 m), weight 144 lb (65.318 kg).  General appearance: alert, cooperative and no distress Neck: no carotid bruit and no JVD Lungs: clear to auscultation bilaterally Heart: regular rate and rhythm Extremities: no LEE Pulses: 2+ and symmetric Skin: warm and dry Neurologic: Grossly normal  EKG  Sinus Tachycardia with occasional PVCs; Ventricular rate 104 bpm  ASSESSMENT AND PLAN:   CAD (coronary artery disease) S/p NSTEMI 09/01/13. S/p PCI + DES to proximal LAD and staged PCI + DES to proximal RCA. She had a brief episode of atypical chest pain 1 day ago that resolved spontaneously. [facility] and was discharged home. She denies any further symptoms similar to what she initially presented with, at time of her NSTEMI. Continue DAPT with ASA + Brilinta. Continue BB, ACE-I and statin.   HTN (hypertension) A bit soft at 100/78, but she is asymptomatic. We will decrease her Lisinopril to 5 mg, to allow for upward titration of her Toprol, in an effort for better rate control.    Hyperlipidemia Continue high dose statin therapy with Lipitor.  Tobacco abuse Pt reports that she quit smoking. Pt was congratulated on her efforts and was encouraged to stay on tract.     PLAN  Pt appears to be doing fairly well post discharge. However, she continues to have slight resting tachycardia. Her resting HR today is 104. Her EKG also demonstrates PVCs. I have elected to increase her Toprol from 25 mg to 50 mg daily. Since her BP is a  bit soft, I have decided to reduce her Lisinopril from 10 mg down to 5 mg to allow for more BP room since we are increasing her BB. The proper use of SL NTG use was discussed. Pt was instructed to notify our office/ seek emergent medical attention if she develops recurrent symptoms. She was advised to continue avoiding tobacco use. F/U with Dr. Tresa EndoKelly in 4-6 weeks.   SIMMONS, BRITTAINYPA-C 09/14/2013 11:29 AM

## 2013-09-14 NOTE — Assessment & Plan Note (Addendum)
S/p NSTEMI 09/01/13. S/p PCI + DES to proximal LAD and staged PCI + DES to proximal RCA. She had a brief episode of atypical chest pain 1 day ago that resolved spontaneously. She was evaluated at [facility] ER and was discharged home. She denies any further symptoms similar to what she initially presented with, at time of her NSTEMI. Continue DAPT with ASA + BB. Continue BB, ACE-I and statin.

## 2013-09-22 ENCOUNTER — Ambulatory Visit: Payer: BC Managed Care – PPO | Admitting: Cardiology

## 2013-10-08 ENCOUNTER — Ambulatory Visit (INDEPENDENT_AMBULATORY_CARE_PROVIDER_SITE_OTHER): Payer: BC Managed Care – PPO | Admitting: Cardiovascular Disease

## 2013-10-08 ENCOUNTER — Telehealth: Payer: Self-pay | Admitting: Cardiovascular Disease

## 2013-10-08 ENCOUNTER — Encounter: Payer: Self-pay | Admitting: Cardiovascular Disease

## 2013-10-08 VITALS — BP 122/80 | HR 73 | Ht 64.0 in | Wt 144.4 lb

## 2013-10-08 DIAGNOSIS — Z720 Tobacco use: Secondary | ICD-10-CM

## 2013-10-08 DIAGNOSIS — R002 Palpitations: Secondary | ICD-10-CM

## 2013-10-08 DIAGNOSIS — F17200 Nicotine dependence, unspecified, uncomplicated: Secondary | ICD-10-CM

## 2013-10-08 DIAGNOSIS — J449 Chronic obstructive pulmonary disease, unspecified: Secondary | ICD-10-CM

## 2013-10-08 DIAGNOSIS — I10 Essential (primary) hypertension: Secondary | ICD-10-CM

## 2013-10-08 DIAGNOSIS — I200 Unstable angina: Secondary | ICD-10-CM

## 2013-10-08 DIAGNOSIS — K219 Gastro-esophageal reflux disease without esophagitis: Secondary | ICD-10-CM

## 2013-10-08 DIAGNOSIS — E785 Hyperlipidemia, unspecified: Secondary | ICD-10-CM

## 2013-10-08 DIAGNOSIS — I714 Abdominal aortic aneurysm, without rupture, unspecified: Secondary | ICD-10-CM

## 2013-10-08 DIAGNOSIS — I2510 Atherosclerotic heart disease of native coronary artery without angina pectoris: Secondary | ICD-10-CM

## 2013-10-08 DIAGNOSIS — I249 Acute ischemic heart disease, unspecified: Secondary | ICD-10-CM

## 2013-10-08 MED ORDER — METOPROLOL SUCCINATE ER 50 MG PO TB24: ORAL_TABLET | ORAL | Status: DC

## 2013-10-08 NOTE — Telephone Encounter (Signed)
Returned call and pt verified x 2.  Pt c/o heart racing and not being able to catch her breath.  Pt stated it happened twice today and it started last night.  Denied CP now or when she has symptoms.  Denied previous h/o these symptoms.  Stated she feels okay right now, but a little weak.  RN asked pt to check BP now: 135/85 HR 97.  Dr. Tresa Endo notified and advised pt come in to see him today at 3:15 pm.  Pt informed and also advised to go to nearest ER for any CP, SOB and return of fast HR.  Pt verbalized understanding and agreed w/ plan.   Appt scheduled.

## 2013-10-08 NOTE — Patient Instructions (Signed)
Your physician has recommended you make the following change in your medication: increase the metoprolol sec er to 1 & 1/2 tablet daily. If still feeling rapid heart beat increase to 2 tablets once daily.  Your physician recommends that you return for lab work fasting in 2 weeks.  Your physician recommends that you schedule a follow-up appointment in: 4 weeks.  Dr. Tresa Endo recommends that You quit smoking.  Decrease the niccoderm to 14 mg.

## 2013-10-08 NOTE — Progress Notes (Signed)
Patient ID: Olivia SousLinda S Wade, female   DOB: Dec 28, 1950, 63 y.o.   MRN: [id number]     HPI: Olivia Wade is a 63 year old female establish cardiology care with me in December 2014.  She presents now in followup of her recent acute coronary syndrome/emergent and staged percutaneous coronary interventions.  Olivia Wade had a very  long-standing history of significant tobacco use and started smoking in 1977 and has smoked up to 3 packs per day for many years. She has a history of chronic low back pain and has been evaluated by Dr. Gerlene FeeKritzer. She has also a documented asymptomatic abdominal aortic aneurysm and on last testing one year ago this measured 3.3x3.3 cm. Additional problems include COPD/asthma. She is status post laparoscopic Nissen fundoplication for severe reflux is also status post cholecystectomy and hysterectomy remotely. In July 2014 she did have an echo Doppler study which showed mild LVH with normal systolic function. She did have grade 1 diastolic dysfunction. There was evidence for mild mitral regurgitation. A nuclear perfusion study was done several years ago which showed normal perfusion without scar or ischemia in 2012.  She developed severe chest pain on 09/08/2013, which time her initial ECG showed hyperacute anterior T waves, which did improve with nitroglycerin and heparin.  She continued to experience chest pain was taken to the cardiac catheterization laboratory and was found to have subtotal proximal LAD stenosis associated with moderate mid distal anterolateral hypocontractility.  She also had a 90% Napkin ring like stenosis in her proximal RCA.  On 09/08/2013 she underwent successful PCI with cutting balloon, PTCA, and DES stenting of her proximal LAD with insertion of a 3.0x23 mm size helpline stent, postdilated to 3.25 mm.  2 days later, she underwent successful intervention to the 90% proximal RCA stenosis and had a 3.0x15 mm on its helpline stent, postdilated to 3.25 mm.   Subsequently, she has felt significantly improved, and denies any recurrent chest pain, development.  She has quit smoking and has been using a Nicoderm patch.  For the past several weeks.  She has been on 21 mg patch. She is seen as an add-on today after calling the office complaining of experiencing episodes of palpitations.  Last evening associated with shortness of breath.  She again had 3 spells of palpitations.  Early this morning.  She denies associated chest pain.  She denies presyncope or syncope.   Allergies  Allergen Reactions  . Bee Venom Anaphylaxis, Hives and Swelling  . Penicillins Other (See Comments)    PATIENT REPORTS "MOTHER TOLD HER SHE HAD ALLERGY TO PCN"  . Morphine And Related Rash    Current Outpatient Prescriptions  Medication Sig Dispense Refill  . acetaminophen (TYLENOL) 325 MG tablet Take 325 mg by mouth every 6 (six) hours as needed for moderate pain.      Marland Kitchen. albuterol (PROVENTIL HFA;VENTOLIN HFA) 108 (90 BASE) MCG/ACT inhaler Inhale 1 puff into the lungs every 6 (six) hours as needed for wheezing or shortness of breath.      Marland Kitchen. albuterol (PROVENTIL) (2.5 MG/3ML) 0.083% nebulizer solution Take 2.5 mg by nebulization every 6 (six) hours as needed for wheezing or shortness of breath.      . ALPRAZolam (XANAX) 0.5 MG tablet Take 0.5 mg by mouth 3 (three) times daily as needed for anxiety.      Marland Kitchen. aspirin 81 MG tablet Take 81 mg by mouth daily.      Marland Kitchen. atorvastatin (LIPITOR) 40 MG tablet Take 40 mg by mouth daily.      .Marland Kitchen  citalopram (CELEXA) 20 MG tablet Take 20 mg by mouth daily.      . cyclobenzaprine (FLEXERIL) 10 MG tablet Take 10 mg by mouth 3 (three) times daily as needed for muscle spasms.      Marland Kitchen lisinopril (PRINIVIL,ZESTRIL) 5 MG tablet Take 1 tablet (5 mg total) by mouth daily.  30 tablet  9  . Menthol, Topical Analgesic, (ICY HOT) 7.5 % (ROLL) MISC Apply 1 each topically as needed (for back pain).      . montelukast (SINGULAIR) 10 MG tablet Take 10 mg by mouth at  bedtime.      . nicotine (NICODERM CQ - DOSED IN MG/24 HOURS) 21 mg/24hr patch Place 21 mg onto the skin daily.      . nitroGLYCERIN (NITROSTAT) 0.4 MG SL tablet Place 1 tablet (0.4 mg total) under the tongue every 5 (five) minutes as needed for chest pain.  25 tablet  2  . pantoprazole (PROTONIX) 40 MG tablet Take 40 mg by mouth daily.      . Ticagrelor (BRILINTA) 90 MG TABS tablet Take 1 tablet (90 mg total) by mouth 2 (two) times daily.  60 tablet  10  . metoprolol succinate (TOPROL-XL) 50 MG 24 hr tablet Take  1 & 1/2 -2 as directed per Dr. Tresa Endo  60 tablet  6   No current facility-administered medications for this visit.   Social history is notable in that she is married, has 3 children, 11 grandchildren, and recently has a great-grandchild. She continues to smoke cigarettes and has been smoking since 1977. She does not exercise. There is no alcohol use.  Family History  Problem Relation Age of Onset  . Heart attack Father   . Heart attack Son 48  . Cancer Father     Esophageal  . Hypertension Mother     ROS is negative for fever chills or night sweats. She denies any significant change in weight. There is no change in vision. She denies skin rash. He denies change in hearing. She is unaware lymphadenopathy. She does have a cough intermittently wheezes. She does note shortness of breath with activity. She denies chest pressure. She denies presyncope or syncope.  She has experienced episodic palpitations. She has occasional GERD symptoms and is status post Nissen fundoplication surgery. She does have an asymptomatic abdominal aortic aneurysm. She does not exercise enough to be aware of any claudication symptoms. She denies myalgias. She has chronic low back pain and back discomfort without significant radicular distribution. She denies significant edema. She denies tremor. There is no known diabetes. She denies cold or heat intolerance. She does have issues with anxiety and depression. She  does admit to some difficulty with balance making her walking difficult. Other comprehensive 14 point system review is negative.  PE BP 122/80  Pulse 73  Ht 5\' 4"  (1.626 m)  Wt 144 lb 6.4 oz (65.499 kg)  BMI 24.77 kg/m2 General: Alert, oriented, no distress. She appears significantly older than her stated age. Skin: normal turgor, no rashes HEENT: Normocephalic, atraumatic. Pupils round and reactive; sclera anicteric; Fundi mild arteriolar narrowing. No hemorrhages or exudates Nose without nasal septal hypertrophy Mouth/Parynx benign; Mallinpatti scale 3 Neck: No JVD, no carotid bruits Lungs: Diffusely decreased breath sounds; no wheezing or rales Chest wall: without tenderness to palpitation Heart: RRR, s1 s2 normal 1/6 systolic murmur. No S3 gallop.  No diastolic murmur.  No rubs, thrills or heaves. Abdomen: soft, nontender; no hepatosplenomehaly, BS+; abdominal aorta nontender and not dilated by  palpation. Back: no CVA tenderness Pulses 2+ Extremities: no clubbing cyanosis or edema, Homan's sign negative  Neurologic: grossly nonfocal; Cranial nerves grossly wnl Psychologic: Normal mood and affect  ECG (independently read by me): Normal sinus rhythm at 73 beats per minute with preserved R waves but diffuse T-wave inversion V2 through V6, as well as in leads 1, 2 3-1/2.  Prior 05/26/2013 ECG: Sinus rhythm 82 beats per minute. QTc interval 450 ms. No significant ST-T changes  LABS:  BMET    Component Value Date/Time   NA 138 09/13/2013 2220   K 3.0* 09/13/2013 2220   CL 100 09/13/2013 2220   CO2 21 09/13/2013 2220   GLUCOSE 101* 09/13/2013 2220   BUN 7 09/13/2013 2220   CREATININE 0.77 09/13/2013 2220   CREATININE 0.81 01/15/2013 0759   CALCIUM 8.5 09/13/2013 2220   GFRNONAA 89* 09/13/2013 2220   GFRAA >90 09/13/2013 2220     Hepatic Function Panel     Component Value Date/Time   PROT 7.0 12/31/2012 1617   ALBUMIN 4.3 12/31/2012 1617   AST 16 12/31/2012 1617   ALT 11 12/31/2012 1617    ALKPHOS 76 12/31/2012 1617   BILITOT 0.4 12/31/2012 1617     CBC    Component Value Date/Time   WBC 8.0 09/13/2013 2220   RBC 3.30* 09/13/2013 2220   HGB 10.9* 09/13/2013 2220   HCT 31.2* 09/13/2013 2220   PLT 206 09/13/2013 2220   MCV 94.5 09/13/2013 2220   MCH 33.0 09/13/2013 2220   MCHC 34.9 09/13/2013 2220   RDW 12.6 09/13/2013 2220   LYMPHSABS 2.3 09/13/2013 2220   MONOABS 0.8 09/13/2013 2220   EOSABS 0.2 09/13/2013 2220   BASOSABS 0.0 09/13/2013 2220     BNP    Component Value Date/Time   PROBNP 61.8 09/08/2013 1055    Lipid Panel     Component Value Date/Time   CHOL 140 09/09/2013 0055     RADIOLOGY: No results found.   ASSESSMENT AND PLAN:  Olivia Wade is a 63 year old female who is a 37 year history of significant tobacco abuse up to 3 packs per day. She does have COPD/asthma.  She presented with an acute coronary syndrome/non-ST segment elevation MI and initially had hyperacute ST elevation, which did improve with nitroglycerin and heparin.  She was taken to the catheterization laboratory and was found to have subtotal proximal LAD stenosis, which was successfully stented to cover as well.  Diffuse 50% stenosis proximal to the high-grade lesion.  She subsequently underwent staged intervention to her high-grade RCA stenosis.  2 days later.  I commended her on her smoking cessation.  She has experienced episodes of palpitations leading to being seen as an add-on today.  She has been taking metoprolol succinate 50 mg daily.  I suggested she take an extra 25 mg today, and beginning tomorrow morning to take 75 mg daily in the morning.  If she does experience recurrent palpitations.  She will then titrate this to 100 mg daily.  Have also recommended she decrease her Nicoderm patch to 14 mg.  Followup blood work will be obtained in 2 weeks, consisting of a comprehensive metabolic panel, CBC, and lipid panel.  She now is on atorvastatin.  She is doing well with dual antiplatelet therapy with Brilinta and  aspirin.  She is tolerating low-dose ACE inhibitor with lisinopril following her MI.  I will see her in 4 weeks for cardiology reevaluation.   Lennette Bihari, MD, [facility] 10/08/2013 7:52  PM

## 2013-10-08 NOTE — Telephone Encounter (Signed)
Please call,heart racing real fast and short of breath.

## 2013-10-17 LAB — MAGNESIUM: MAGNESIUM: 2 mg/dL (ref 1.5–2.5)

## 2013-10-17 LAB — LIPID PANEL
Cholesterol: 118 mg/dL (ref 0–200)
HDL: 31 mg/dL — AB (ref >39)
LDL Cholesterol: 47 mg/dL (ref 0–99)
Total CHOL/HDL Ratio: 3.8 Ratio
Triglycerides: 200 mg/dL — ABNORMAL HIGH (ref <150)
VLDL: 40 mg/dL (ref 0–40)

## 2013-10-17 LAB — COMPREHENSIVE METABOLIC PANEL
ALK PHOS: 73 U/L (ref 39–117)
ALT: 12 U/L (ref 0–35)
AST: 17 U/L (ref 0–37)
Albumin: 3.9 g/dL (ref 3.5–5.2)
BUN: 11 mg/dL (ref 6–23)
CALCIUM: 8.8 mg/dL (ref 8.4–10.5)
CO2: 23 mEq/L (ref 19–32)
CREATININE: 0.98 mg/dL (ref 0.50–1.10)
Chloride: 106 mEq/L (ref 96–112)
Glucose, Bld: 76 mg/dL (ref 70–99)
Potassium: 4.8 mEq/L (ref 3.5–5.3)
Sodium: 139 mEq/L (ref 135–145)
Total Bilirubin: 0.6 mg/dL (ref 0.2–1.2)
Total Protein: 6.5 g/dL (ref 6.0–8.3)

## 2013-10-17 LAB — CBC
HCT: 35 % — ABNORMAL LOW (ref 36.0–46.0)
Hemoglobin: 11.9 g/dL — ABNORMAL LOW (ref 12.0–15.0)
MCH: 31.8 pg (ref 26.0–34.0)
MCHC: 34 g/dL (ref 30.0–36.0)
MCV: 93.6 fL (ref 78.0–100.0)
Platelets: 313 10*3/uL (ref 150–400)
RBC: 3.74 MIL/uL — ABNORMAL LOW (ref 3.87–5.11)
RDW: 13.7 % (ref 11.5–15.5)
WBC: 6.7 10*3/uL (ref 4.0–10.5)

## 2013-10-21 NOTE — Addendum Note (Signed)
Addended byGaynelle Cage. on: 10/21/2013 01:29 PM   Modules accepted: Orders

## 2013-10-27 ENCOUNTER — Ambulatory Visit: Payer: BC Managed Care – PPO | Admitting: Cardiovascular Disease

## 2013-11-03 ENCOUNTER — Encounter (HOSPITAL_COMMUNITY): Payer: BC Managed Care – PPO

## 2013-11-23 ENCOUNTER — Ambulatory Visit: Payer: BC Managed Care – PPO | Admitting: Cardiovascular Disease

## 2013-12-09 MED ORDER — CYCLOBENZAPRINE HCL 10 MG PO TABS: 10.0000 mg | ORAL_TABLET | Freq: Three times a day (TID) | ORAL | Status: AC | PRN

## 2013-12-18 ENCOUNTER — Encounter: Payer: Self-pay | Admitting: *Deleted

## 2013-12-22 ENCOUNTER — Emergency Department (HOSPITAL_COMMUNITY): Payer: BC Managed Care – PPO

## 2013-12-22 ENCOUNTER — Observation Stay (HOSPITAL_COMMUNITY)
Admission: EM | Admit: 2013-12-22 | Discharge: 2013-12-23 | Disposition: A | Discharge status: Home / Self Care | Payer: BC Managed Care – PPO | Attending: Cardiovascular Disease | Admitting: Cardiovascular Disease

## 2013-12-22 ENCOUNTER — Encounter (HOSPITAL_COMMUNITY): Payer: Self-pay | Admitting: Emergency Medicine

## 2013-12-22 DIAGNOSIS — E785 Hyperlipidemia, unspecified: Secondary | ICD-10-CM | POA: Insufficient documentation

## 2013-12-22 DIAGNOSIS — Z79899 Other long term (current) drug therapy: Secondary | ICD-10-CM | POA: Insufficient documentation

## 2013-12-22 DIAGNOSIS — R000 Tachycardia, unspecified: Secondary | ICD-10-CM

## 2013-12-22 DIAGNOSIS — Z87891 Personal history of nicotine dependence: Secondary | ICD-10-CM | POA: Insufficient documentation

## 2013-12-22 DIAGNOSIS — I209 Angina pectoris, unspecified: Secondary | ICD-10-CM

## 2013-12-22 DIAGNOSIS — J4489 Other specified chronic obstructive pulmonary disease: Secondary | ICD-10-CM | POA: Insufficient documentation

## 2013-12-22 DIAGNOSIS — Z7982 Long term (current) use of aspirin: Secondary | ICD-10-CM | POA: Insufficient documentation

## 2013-12-22 DIAGNOSIS — I25118 Atherosclerotic heart disease of native coronary artery with other forms of angina pectoris: Secondary | ICD-10-CM

## 2013-12-22 DIAGNOSIS — R0789 Other chest pain: Principal | ICD-10-CM | POA: Insufficient documentation

## 2013-12-22 DIAGNOSIS — J449 Chronic obstructive pulmonary disease, unspecified: Secondary | ICD-10-CM | POA: Insufficient documentation

## 2013-12-22 DIAGNOSIS — K219 Gastro-esophageal reflux disease without esophagitis: Secondary | ICD-10-CM

## 2013-12-22 DIAGNOSIS — G8929 Other chronic pain: Secondary | ICD-10-CM | POA: Insufficient documentation

## 2013-12-22 DIAGNOSIS — I714 Abdominal aortic aneurysm, without rupture, unspecified: Secondary | ICD-10-CM | POA: Insufficient documentation

## 2013-12-22 DIAGNOSIS — R079 Chest pain, unspecified: Secondary | ICD-10-CM

## 2013-12-22 DIAGNOSIS — R55 Syncope and collapse: Secondary | ICD-10-CM

## 2013-12-22 DIAGNOSIS — Z880 Allergy status to penicillin: Secondary | ICD-10-CM | POA: Insufficient documentation

## 2013-12-22 DIAGNOSIS — Z9861 Coronary angioplasty status: Secondary | ICD-10-CM | POA: Insufficient documentation

## 2013-12-22 DIAGNOSIS — I25119 Atherosclerotic heart disease of native coronary artery with unspecified angina pectoris: Secondary | ICD-10-CM

## 2013-12-22 DIAGNOSIS — F411 Generalized anxiety disorder: Secondary | ICD-10-CM | POA: Insufficient documentation

## 2013-12-22 DIAGNOSIS — I2510 Atherosclerotic heart disease of native coronary artery without angina pectoris: Secondary | ICD-10-CM

## 2013-12-22 DIAGNOSIS — I10 Essential (primary) hypertension: Secondary | ICD-10-CM | POA: Insufficient documentation

## 2013-12-22 DIAGNOSIS — M129 Arthropathy, unspecified: Secondary | ICD-10-CM | POA: Insufficient documentation

## 2013-12-22 HISTORY — DX: Unspecified osteoarthritis, unspecified site: M19.90

## 2013-12-22 HISTORY — DX: Palpitations: R00.2

## 2013-12-22 HISTORY — DX: Non-ST elevation (NSTEMI) myocardial infarction: I21.4

## 2013-12-22 HISTORY — DX: Atherosclerotic heart disease of native coronary artery without angina pectoris: I25.10

## 2013-12-22 LAB — TROPONIN I
Troponin I: <0.3 ng/mL (ref <0.30)
Troponin I: <0.3 ng/mL (ref <0.30)
Troponin I: <0.3 ng/mL (ref <0.30)

## 2013-12-22 LAB — CBC WITH DIFFERENTIAL/PLATELET
Basophils Absolute: 0 10*3/uL (ref 0.0–0.1)
Basophils Relative: 0 % (ref 0–1)
Eosinophils Absolute: 0 10*3/uL (ref 0.0–0.7)
Eosinophils Relative: 1 % (ref 0–5)
HCT: 33.7 % — ABNORMAL LOW (ref 36.0–46.0)
Hemoglobin: 11.3 g/dL — ABNORMAL LOW (ref 12.0–15.0)
Lymphocytes Relative: 20 % (ref 12–46)
Lymphs Abs: 1.7 10*3/uL (ref 0.7–4.0)
MCH: 32 pg (ref 26.0–34.0)
MCHC: 33.5 g/dL (ref 30.0–36.0)
MCV: 95.5 fL (ref 78.0–100.0)
Monocytes Absolute: 0.4 10*3/uL (ref 0.1–1.0)
Monocytes Relative: 5 % (ref 3–12)
Neutro Abs: 6.5 10*3/uL (ref 1.7–7.7)
Neutrophils Relative %: 74 % (ref 43–77)
Platelets: 264 10*3/uL (ref 150–400)
RBC: 3.53 MIL/uL — ABNORMAL LOW (ref 3.87–5.11)
RDW: 12.9 % (ref 11.5–15.5)
WBC: 8.8 10*3/uL (ref 4.0–10.5)

## 2013-12-22 LAB — BASIC METABOLIC PANEL
Anion gap: 18 — ABNORMAL HIGH (ref 5–15)
BUN: 14 mg/dL (ref 6–23)
CO2: 20 mEq/L (ref 19–32)
Calcium: 8.8 mg/dL (ref 8.4–10.5)
Chloride: 97 mEq/L (ref 96–112)
Creatinine, Ser: 1.16 mg/dL — ABNORMAL HIGH (ref 0.50–1.10)
GFR calc Af Amer: 58 mL/min — ABNORMAL LOW (ref >90)
GFR calc non Af Amer: 50 mL/min — ABNORMAL LOW (ref >90)
Glucose, Bld: 99 mg/dL (ref 70–99)
Potassium: 4.6 mEq/L (ref 3.7–5.3)
Sodium: 135 mEq/L — ABNORMAL LOW (ref 137–147)

## 2013-12-22 LAB — TSH: TSH: 0.665 u[IU]/mL (ref 0.350–4.500)

## 2013-12-22 MED ORDER — ASPIRIN 81 MG PO CHEW
81.0000 mg | CHEWABLE_TABLET | Freq: Every day | ORAL | Status: DC
  Administered 2013-12-23: 81 mg via ORAL
  Filled 2013-12-22: qty 1

## 2013-12-22 MED ORDER — ALBUTEROL SULFATE (2.5 MG/3ML) 0.083% IN NEBU: 2.5000 mg | INHALATION_SOLUTION | Freq: Four times a day (QID) | RESPIRATORY_TRACT | Status: DC | PRN

## 2013-12-22 MED ORDER — ALBUTEROL SULFATE HFA 108 (90 BASE) MCG/ACT IN AERS
1.0000 | INHALATION_SPRAY | Freq: Four times a day (QID) | RESPIRATORY_TRACT | Status: DC | PRN
Start: 2013-12-22 — End: 2013-12-22

## 2013-12-22 MED ORDER — TICAGRELOR 90 MG PO TABS
90.0000 mg | ORAL_TABLET | Freq: Two times a day (BID) | ORAL | Status: DC
  Administered 2013-12-22 – 2013-12-23 (×2): 90 mg via ORAL
  Filled 2013-12-22 (×3): qty 1

## 2013-12-22 MED ORDER — LISINOPRIL 5 MG PO TABS
5.0000 mg | ORAL_TABLET | Freq: Every day | ORAL | Status: DC
  Administered 2013-12-23: 5 mg via ORAL
  Filled 2013-12-22: qty 1

## 2013-12-22 MED ORDER — METOPROLOL SUCCINATE ER 25 MG PO TB24
25.0000 mg | ORAL_TABLET | Freq: Two times a day (BID) | ORAL | Status: DC
  Administered 2013-12-22: 25 mg via ORAL
  Filled 2013-12-22 (×3): qty 2

## 2013-12-22 MED ORDER — ACETAMINOPHEN 325 MG PO TABS: 325.0000 mg | ORAL_TABLET | Freq: Four times a day (QID) | ORAL | Status: DC | PRN

## 2013-12-22 MED ORDER — ASPIRIN 300 MG RE SUPP: 300.0000 mg | RECTAL | Status: DC

## 2013-12-22 MED ORDER — ONDANSETRON HCL 4 MG/2ML IJ SOLN: 4.0000 mg | Freq: Four times a day (QID) | INTRAMUSCULAR | Status: DC | PRN

## 2013-12-22 MED ORDER — ATORVASTATIN CALCIUM 40 MG PO TABS
40.0000 mg | ORAL_TABLET | Freq: Every day | ORAL | Status: DC
  Administered 2013-12-22 – 2013-12-23 (×2): 40 mg via ORAL
  Filled 2013-12-22 (×2): qty 1

## 2013-12-22 MED ORDER — TRAMADOL HCL 50 MG PO TABS: 50.0000 mg | ORAL_TABLET | ORAL | Status: DC | PRN

## 2013-12-22 MED ORDER — ZOLPIDEM TARTRATE 5 MG PO TABS
5.0000 mg | ORAL_TABLET | Freq: Every evening | ORAL | Status: DC | PRN
  Administered 2013-12-22: 5 mg via ORAL
  Filled 2013-12-22: qty 1

## 2013-12-22 MED ORDER — CITALOPRAM HYDROBROMIDE 20 MG PO TABS
20.0000 mg | ORAL_TABLET | Freq: Every day | ORAL | Status: DC
  Administered 2013-12-23: 20 mg via ORAL
  Filled 2013-12-22: qty 1

## 2013-12-22 MED ORDER — ASPIRIN EC 81 MG PO TBEC
81.0000 mg | DELAYED_RELEASE_TABLET | Freq: Every day | ORAL | Status: DC
  Administered 2013-12-23: 81 mg via ORAL
  Filled 2013-12-22: qty 1

## 2013-12-22 MED ORDER — NITROGLYCERIN 0.4 MG SL SUBL: 0.4000 mg | SUBLINGUAL_TABLET | SUBLINGUAL | Status: DC | PRN

## 2013-12-22 MED ORDER — HEPARIN SODIUM (PORCINE) 5000 UNIT/ML IJ SOLN
5000.0000 [IU] | Freq: Three times a day (TID) | INTRAMUSCULAR | Status: DC
  Filled 2013-12-22 (×5): qty 1

## 2013-12-22 MED ORDER — MONTELUKAST SODIUM 10 MG PO TABS
10.0000 mg | ORAL_TABLET | Freq: Every day | ORAL | Status: DC
  Administered 2013-12-22: 10 mg via ORAL
  Filled 2013-12-22 (×2): qty 1

## 2013-12-22 MED ORDER — ALPRAZOLAM 0.25 MG PO TABS: 0.2500 mg | ORAL_TABLET | Freq: Two times a day (BID) | ORAL | Status: DC | PRN

## 2013-12-22 MED ORDER — PANTOPRAZOLE SODIUM 40 MG PO TBEC: 40.0000 mg | DELAYED_RELEASE_TABLET | Freq: Every day | ORAL | Status: DC

## 2013-12-22 MED ORDER — ASPIRIN 81 MG PO CHEW: 324.0000 mg | CHEWABLE_TABLET | ORAL | Status: DC

## 2013-12-22 MED ORDER — ACETAMINOPHEN 325 MG PO TABS: 650.0000 mg | ORAL_TABLET | ORAL | Status: DC | PRN

## 2013-12-22 MED ORDER — ALPRAZOLAM 0.5 MG PO TABS
0.5000 mg | ORAL_TABLET | Freq: Three times a day (TID) | ORAL | Status: DC | PRN
  Administered 2013-12-22: 0.5 mg via ORAL
  Filled 2013-12-22: qty 1

## 2013-12-22 MED ORDER — CYCLOBENZAPRINE HCL 10 MG PO TABS
10.0000 mg | ORAL_TABLET | Freq: Three times a day (TID) | ORAL | Status: DC | PRN
  Filled 2013-12-22: qty 1

## 2013-12-22 NOTE — ED Notes (Signed)
Attempted report X1

## 2013-12-22 NOTE — ED Notes (Signed)
Patient transported by [facility] for complaint of chest pain starting around 1200.  Patient describes as chest tightness and pressure with burning.  States feels like acid reflux.  Patient had heart cath with 2 stents place in march.  Patient took 2 sublingual nitro and 324 mg aspirin prior to EMS arrival.  EMS gave 1 sublingual nitro and patient is now pain free.  Patient states she still feels dizzy and has tightness.

## 2013-12-22 NOTE — H&P (Signed)
Olivia Wade is an 63 y.o. female.    Primary Cardiologist:Dr. Claiborne Billings PCP: Celedonio Savage, MD  Chief Complaint: chest pain and syncope HPI: 63 year old female with long-standing history of significant tobacco use and started smoking in 1977 and has smoked up to 3 packs per day for many years, but stopped 08/2013. She has a history of chronic low back pain and has been evaluated by Dr. Hal Neer. She has also a documented asymptomatic abdominal aortic aneurysm and on last testing one year ago this measured 3.3x3.3 cm. Additional problems include COPD/asthma. She is status post laparoscopic Nissen fundoplication for severe reflux is also status post cholecystectomy and hysterectomy remotely. In July 2014 she did have an echo Doppler study which showed mild LVH with normal systolic function. She did have grade 1 diastolic dysfunction. There was evidence for mild mitral regurgitation. A nuclear perfusion study was done several years ago which showed normal perfusion without scar or ischemia in 2012.   She developed severe chest pain on 09/08/2013, which time her initial ECG showed hyperacute anterior T waves, which did improve with nitroglycerin and heparin. She continued to experience chest pain was taken to the cardiac catheterization laboratory and was found to have subtotal proximal LAD stenosis associated with moderate mid distal anterolateral hypocontractility. She also had a 90% Napkin ring like stenosis in her proximal RCA. On 09/08/2013 she underwent successful PCI with cutting balloon, PTCA, and DES stenting of her proximal LAD with insertion of a 3.0x23 mm size helpline stent, postdilated to 3.25 mm. 2 days later, she underwent successful intervention to the 90% proximal RCA stenosis and had a 3.0x15 mm on its helpline stent, postdilated to 3.25 mm. Subsequently, she had felt significantly improved,no further chest pain and she walks daily.  She was seen by Dr. Claiborne Billings complaining of  experiencing episodes of palpitations 10/08/13- the evening before visit, it was associated with shortness of breath. She again had 3 spells of palpitations early on morning of admit.   Today she just did not feel well, weak and nauseated, though the nausea she has been having along with no appetite.  She then developed chest tightness and "heartburn" similar to MI but in slightly different place.   She did take NTG but was standing, or sitting then stood, and while talking on the phone passed out.  Her granddaughter who was on the phone called EMS.  When pt came to she did feel no further pain.    Here in ER troponin neg.  EKG overall is improved from last EKG in April. No pain no, no complaints.  Prior to today she felt well.       Past Medical History  Diagnosis Date  . AAA (abdominal aortic aneurysm)   . Tobacco abuse   . HTN (hypertension)   . COPD (chronic obstructive pulmonary disease)   . Asthma   . HLD (hyperlipidemia)   . GERD (gastroesophageal reflux disease)   . Chronic low back pain     (Old vertebral fracture)  . Arthritis   . Coronary artery disease 09/08/13    with PCI  . NSTEMI (non-ST elevated myocardial infarction) 09/08/13  . Heart palpitations     prn toprol for rapid HR    Past Surgical History  Procedure Laterality Date  . Coronary stent placement  09/08/13    PCI with cutting balloon, PTCA,DES to prox LAD  . Coronary angioplasty with stent placement  09/10/13  PCI of RCA 90% proximal RCA stenosis with DES  . Laparoscopic nissen fundoplication      for severe reflux  . Cholecystectomy    . Abdominal hysterectomy    . 2d echo  12/2012    normal LV with mild LVH, grade 1 diastolic dysfunction    Family History  Problem Relation Age of Onset  . Heart attack Father   . Heart attack Son 80  . Cancer Father     Esophageal  . Hypertension Mother    Social History:  reports that she quit smoking about 3 months ago. Her smoking use included Cigarettes. She  has a 18.5 pack-year smoking history. She has never used smokeless tobacco. She reports that she does not drink alcohol or use illicit drugs.  Allergies:  Allergies  Allergen Reactions  . Bee Venom Anaphylaxis, Hives and Swelling  . Penicillins Other (See Comments)    PATIENT REPORTS "MOTHER TOLD HER SHE HAD ALLERGY TO PCN"  . Morphine And Related Rash    No current facility-administered medications on file prior to encounter.   Current Outpatient Prescriptions on File Prior to Encounter  Medication Sig Dispense Refill  . acetaminophen (TYLENOL) 325 MG tablet Take 325 mg by mouth every 6 (six) hours as needed for moderate pain.      Marland Kitchen albuterol (PROVENTIL HFA;VENTOLIN HFA) 108 (90 BASE) MCG/ACT inhaler Inhale 1 puff into the lungs every 6 (six) hours as needed for wheezing or shortness of breath.      Marland Kitchen albuterol (PROVENTIL) (2.5 MG/3ML) 0.083% nebulizer solution Take 2.5 mg by nebulization every 6 (six) hours as needed for wheezing or shortness of breath.      . ALPRAZolam (XANAX) 0.5 MG tablet Take 0.5 mg by mouth 3 (three) times daily as needed for anxiety.      Marland Kitchen aspirin 81 MG tablet Take 81 mg by mouth daily.      Marland Kitchen atorvastatin (LIPITOR) 40 MG tablet Take 40 mg by mouth daily.      . citalopram (CELEXA) 20 MG tablet Take 20 mg by mouth daily.      . cyclobenzaprine (FLEXERIL) 10 MG tablet Take 1 tablet (10 mg total) by mouth 3 (three) times daily as needed for muscle spasms.  30 tablet  6  . lisinopril (PRINIVIL,ZESTRIL) 5 MG tablet Take 1 tablet (5 mg total) by mouth daily.  30 tablet  9  . montelukast (SINGULAIR) 10 MG tablet Take 10 mg by mouth at bedtime.      . nitroGLYCERIN (NITROSTAT) 0.4 MG SL tablet Place 1 tablet (0.4 mg total) under the tongue every 5 (five) minutes as needed for chest pain.  25 tablet  2  . pantoprazole (PROTONIX) 40 MG tablet Take 40 mg by mouth daily.      . Ticagrelor (BRILINTA) 90 MG TABS tablet Take 1 tablet (90 mg total) by mouth 2 (two) times  daily.  60 tablet  10    Results for orders placed during the hospital encounter of 12/22/13 (from the past 48 hour(s))  CBC WITH DIFFERENTIAL     Status: Abnormal   Collection Time    12/22/13  1:50 PM      Result Value Ref Range   WBC 8.8  4.0 - 10.5 K/uL   RBC 3.53 (*) 3.87 - 5.11 MIL/uL   Hemoglobin 11.3 (*) 12.0 - 15.0 g/dL   HCT 33.7 (*) 36.0 - 46.0 %   MCV 95.5  78.0 - 100.0 fL   MCH  32.0  26.0 - 34.0 pg   MCHC 33.5  30.0 - 36.0 g/dL   RDW 12.9  11.5 - 15.5 %   Platelets 264  150 - 400 K/uL   Neutrophils Relative % 74  43 - 77 %   Neutro Abs 6.5  1.7 - 7.7 K/uL   Lymphocytes Relative 20  12 - 46 %   Lymphs Abs 1.7  0.7 - 4.0 K/uL   Monocytes Relative 5  3 - 12 %   Monocytes Absolute 0.4  0.1 - 1.0 K/uL   Eosinophils Relative 1  0 - 5 %   Eosinophils Absolute 0.0  0.0 - 0.7 K/uL   Basophils Relative 0  0 - 1 %   Basophils Absolute 0.0  0.0 - 0.1 K/uL  BASIC METABOLIC PANEL     Status: Abnormal   Collection Time    12/22/13  1:50 PM      Result Value Ref Range   Sodium 135 (*) 137 - 147 mEq/L   Potassium 4.6  3.7 - 5.3 mEq/L   Chloride 97  96 - 112 mEq/L   CO2 20  19 - 32 mEq/L   Glucose, Bld 99  70 - 99 mg/dL   BUN 14  6 - 23 mg/dL   Creatinine, Ser 1.16 (*) 0.50 - 1.10 mg/dL   Calcium 8.8  8.4 - 10.5 mg/dL   GFR calc non Af Amer 50 (*) >90 mL/min   GFR calc Af Amer 58 (*) >90 mL/min   Comment: (NOTE)     The eGFR has been calculated using the CKD EPI equation.     This calculation has not been validated in all clinical situations.     eGFR's persistently <90 mL/min signify possible Chronic Kidney     Disease.   Anion gap 18 (*) 5 - 15  TROPONIN I     Status: None   Collection Time    12/22/13  1:50 PM      Result Value Ref Range   Troponin I <0.30  <0.30 ng/mL   Comment:            Due to the release kinetics of cTnI,     a negative result within the first hours     of the onset of symptoms does not rule out     myocardial infarction with certainty.      If myocardial infarction is still suspected,     repeat the test at appropriate intervals.   Dg Chest 2 View  12/22/2013   CLINICAL DATA:  Chest pain  EXAM: CHEST  2 VIEW  COMPARISON:  12/02/2013  FINDINGS: Cardiac shadow is within normal limits. The lungs are clear bilaterally. Mild interstitial changes are again seen. No acute bony abnormality is noted. No pneumothorax is seen.  IMPRESSION: No acute abnormality noted.   Electronically Signed   By: Inez Catalina M.D.   On: 12/22/2013 14:17    ROS: General:no colds or fevers, 11 pound weight change no appetite Skin:no rashes or ulcers HEENT:no blurred vision, no congestion CV:see HPI PUL:see HPI GI:no diarrhea constipation or melena, no indigestion-possibly today GU:no hematuria, no dysuria MS:no joint pain, no claudication Neuro:+  Syncope today after NTG, no lightheadedness Endo:no diabetes, no thyroid disease   Blood pressure 124/86, pulse 74, resp. rate 20, SpO2 100.00%. PE: General:Pleasant affect, NAD Skin:Warm and dry, brisk capillary refill HEENT:normocephalic, sclera clear, mucus membranes moist Neck:supple, no JVD, no bruits, no adenopathy  Heart:S1S2 RRR without  murmur, gallup, rub or click Lungs:clear without rales, few rhonchi, rare wheezes XID:HWYS, non tender, + BS, do not palpate liver spleen or masses Ext:no lower ext edema, 2+ pedal pulses, 2+ radial pulses Neuro:alert and oriented X3, MAE, follows commands, + facial symmetry    Assessment/Plan  Principal Problem:   Chest pain at rest, rule out MI, keep overnight OBS, keep NPO for poss. nuc in AM. Active Problems:   CAD (coronary artery disease), recent DES stents to RCA and LAD in 3/15 and 4/15   Hyperlipidemia   GERD (gastroesophageal reflux disease)   Syncope secondary to NTG   [facility] Nurse Practitioner Certified [place] Pager [phone] or after 5pm or weekends call [phone] 12/22/2013, 5:01 PM    Patient seen and  examined. Agree with assessment and plan. Pt well known to me. She is a former pt of Dr. Rollene Fare who established care with me after his retiremnet. She is s/p 2 vessel PCi to LAD and RCA ~ 4 months ago and has noted much improvement in symptoms. She has noted some chest burning and also has dyspepsia. On PPI. Today she developed chest tightness and had a syncopal after taking sl NTG while standing. ECG is significantly improved from marked abnormal T waves several moths ago, but has mild T abnl. Will keep overnight for observation, monitor troponin and ECG. Possible home in am vs nuclear study.; will keep npo until seen in am.   Troy Sine, MD, [facility] 12/22/2013 7:23 PM

## 2013-12-22 NOTE — ED Notes (Addendum)
Patient returned from Xray. Toniann Fail, RN at the bedside placing patient back on the monitor.

## 2013-12-22 NOTE — ED Provider Notes (Signed)
CSN: [id number]     Arrival date & time 12/22/13  1316 History   First MD Initiated Contact with Patient 12/22/13 1317     Chief Complaint  Patient presents with  . Chest Pain     (Consider location/radiation/quality/duration/timing/severity/associated sxs/prior Treatment) HPI  62yF with CP. Past hx of CAD. Admitted to [facility] 09/08/2013 with ACS. Cardiac catheterizationrevealed  proximal LAD stenosis and she underwent successful stenting. Also had high grade RCA stenosis in RCA which was not initially intervened on, but post-procedure her troponin continued to elevate and she subsequently went back to cath lab and had stenting of this vessel as well. Seen in ED a couple days after this discharge with "pinching" CP but ultimately left AMA. Has been doing ok until shortly after 1200 today when she began having substernal CP. Describes as tight and burning. Relieved with nitro. Reports felt similar as to when had MI. Hx of GERD as well, but says this felt different.  Had associated nausea. No diaphoresis. No SOB. No cough. No fever or chills. Reports compliance with her medications. She had 324mg  ASA prior to arrival. Smoking hx, but stopped after MI in March. Denies drug use.    Past Medical History  Diagnosis Date  . AAA (abdominal aortic aneurysm)   . Tobacco abuse   . HTN (hypertension)   . COPD (chronic obstructive pulmonary disease)   . Asthma   . HLD (hyperlipidemia)   . GERD (gastroesophageal reflux disease)   . Chronic low back pain     (Old vertebral fracture)  . Arthritis    Past Surgical History  Procedure Laterality Date  . Coronary stent placement  03/15   Family History  Problem Relation Age of Onset  . Heart attack Father   . Heart attack Son 64  . Cancer Father     Esophageal  . Hypertension Mother    History  Substance Use Topics  . Smoking status: Former Smoker -- 0.50 packs/day for 37 years    Types: Cigarettes    Quit date: 09/08/2013  . Smokeless tobacco:  Never Used  . Alcohol Use: No   OB History   Grav Para Term Preterm Abortions TAB SAB Ect Mult Living                 Review of Systems  All systems reviewed and negative, other than as noted in HPI.   Allergies  Bee venom; Penicillins; and Morphine and related  Home Medications   Prior to Admission medications   Medication Sig Start Date End Date Taking? Authorizing Provider  aspirin 81 MG tablet Take 81 mg by mouth daily.   Yes Historical Provider, MD  atorvastatin (LIPITOR) 40 MG tablet Take 40 mg by mouth daily.   Yes Historical Provider, MD  citalopram (CELEXA) 20 MG tablet Take 20 mg by mouth daily.   Yes Historical Provider, MD  cyclobenzaprine (FLEXERIL) 10 MG tablet Take 1 tablet (10 mg total) by mouth 3 (three) times daily as needed for muscle spasms.   Yes Lennette Bihari, MD  lisinopril (PRINIVIL,ZESTRIL) 5 MG tablet Take 1 tablet (5 mg total) by mouth daily. 09/14/13  Yes Brittainy Simmons, PA-C  metoprolol succinate (TOPROL-XL) 50 MG 24 hr tablet Take  1 & 1/2 -2 as directed per Dr. Tresa Endo 10/08/13  Yes Lennette Bihari, MD  montelukast (SINGULAIR) 10 MG tablet Take 10 mg by mouth at bedtime.   Yes Historical Provider, MD  nitroGLYCERIN (NITROSTAT) 0.4 MG SL tablet Place  1 tablet (0.4 mg total) under the tongue every 5 (five) minutes as needed for chest pain. 09/11/13  Yes Brittainy Simmons, PA-C  pantoprazole (PROTONIX) 40 MG tablet Take 40 mg by mouth daily.   Yes Historical Provider, MD  Ticagrelor (BRILINTA) 90 MG TABS tablet Take 1 tablet (90 mg total) by mouth 2 (two) times daily. 09/11/13  Yes Brittainy Simmons, PA-C  traMADol (ULTRAM) 50 MG tablet Take 50 mg by mouth every 4 (four) hours as needed (pain).   Yes Historical Provider, MD  acetaminophen (TYLENOL) 325 MG tablet Take 325 mg by mouth every 6 (six) hours as needed for moderate pain.    Historical Provider, MD  albuterol (PROVENTIL HFA;VENTOLIN HFA) 108 (90 BASE) MCG/ACT inhaler Inhale 1 puff into the lungs every 6  (six) hours as needed for wheezing or shortness of breath.    Historical Provider, MD  albuterol (PROVENTIL) (2.5 MG/3ML) 0.083% nebulizer solution Take 2.5 mg by nebulization every 6 (six) hours as needed for wheezing or shortness of breath.    Historical Provider, MD  ALPRAZolam Prudy Feeler(XANAX) 0.5 MG tablet Take 0.5 mg by mouth 3 (three) times daily as needed for anxiety.    Historical Provider, MD  Menthol, Topical Analgesic, (ICY HOT) 7.5 % (ROLL) MISC Apply 1 each topically as needed (for back pain).    Historical Provider, MD  nicotine (NICODERM CQ - DOSED IN MG/24 HOURS) 21 mg/24hr patch Place 21 mg onto the skin daily.    Historical Provider, MD   BP 105/70  Pulse 73  Resp 17  SpO2 100% Physical Exam  Nursing note and vitals reviewed. Constitutional: She appears well-developed and well-nourished. No distress.  HENT:  Head: Normocephalic and atraumatic.  Eyes: Conjunctivae are normal. Right eye exhibits no discharge. Left eye exhibits no discharge.  Neck: Neck supple.  Cardiovascular: Normal rate, regular rhythm and normal heart sounds.  Exam reveals no gallop and no friction rub.   No murmur heard. Pulmonary/Chest: Effort normal and breath sounds normal. No respiratory distress. She exhibits no tenderness.  Abdominal: Soft. She exhibits no distension. There is no tenderness.  Musculoskeletal: She exhibits no edema and no tenderness.  Lower extremities symmetric as compared to each other. No calf tenderness. Negative Homan's. No palpable cords.   Neurological: She is alert.  Skin: Skin is warm and dry.  Psychiatric: Her behavior is normal. Thought content normal.  anxious    ED Course  Procedures (including critical care time) Labs Review Labs Reviewed  CBC WITH DIFFERENTIAL - Abnormal; Notable for the following:    RBC 3.53 (*)    Hemoglobin 11.3 (*)    HCT 33.7 (*)    All other components within normal limits  BASIC METABOLIC PANEL - Abnormal; Notable for the following:     Sodium 135 (*)    Creatinine, Ser 1.16 (*)    GFR calc non Af Amer 50 (*)    GFR calc Af Amer 58 (*)    Anion gap 18 (*)    All other components within normal limits  TROPONIN I    Imaging Review Dg Chest 2 View  12/22/2013   CLINICAL DATA:  Chest pain  EXAM: CHEST  2 VIEW  COMPARISON:  12/02/2013  FINDINGS: Cardiac shadow is within normal limits. The lungs are clear bilaterally. Mild interstitial changes are again seen. No acute bony abnormality is noted. No pneumothorax is seen.  IMPRESSION: No acute abnormality noted.   Electronically Signed   By: Eulah PontMark  Lukens M.D.  On: 12/22/2013 14:17     EKG Interpretation   Date/Time:  Tuesday December 22 2013 13:28:46 EDT Ventricular Rate:  75 PR Interval:  161 QRS Duration: 96 QT Interval:  398 QTC Calculation: 444 R Axis:   35 Text Interpretation:  Sinus rhythm Borderline T abnormalities, anterior  leads, lateral leads Confirmed by Juleen China  MD, Jennaya Pogue ([id number]) on 12/22/2013  1:43:05 PM      MDM   Final diagnoses:  Chest pain, unspecified chest pain type    62yF with CP. Now symptom free. EKG does have some changes as compared to recent. Will discuss with cardiology.     Raeford Razor, MD 12/22/13 1530

## 2013-12-23 DIAGNOSIS — R000 Tachycardia, unspecified: Secondary | ICD-10-CM

## 2013-12-23 LAB — BASIC METABOLIC PANEL
Anion gap: 16 — ABNORMAL HIGH (ref 5–15)
BUN: 12 mg/dL (ref 6–23)
CALCIUM: 9.1 mg/dL (ref 8.4–10.5)
CO2: 21 meq/L (ref 19–32)
Chloride: 101 mEq/L (ref 96–112)
Creatinine, Ser: 0.97 mg/dL (ref 0.50–1.10)
GFR calc Af Amer: 72 mL/min — ABNORMAL LOW (ref >90)
GFR, EST NON AFRICAN AMERICAN: 62 mL/min — AB (ref >90)
Glucose, Bld: 108 mg/dL — ABNORMAL HIGH (ref 70–99)
Potassium: 4.6 mEq/L (ref 3.7–5.3)
Sodium: 138 mEq/L (ref 137–147)

## 2013-12-23 LAB — MAGNESIUM: Magnesium: 2.2 mg/dL (ref 1.5–2.5)

## 2013-12-23 LAB — LIPID PANEL
CHOL/HDL RATIO: 4.1 ratio
CHOLESTEROL: 132 mg/dL (ref 0–200)
HDL: 32 mg/dL — ABNORMAL LOW (ref >39)
LDL Cholesterol: 50 mg/dL (ref 0–99)
Triglycerides: 250 mg/dL — ABNORMAL HIGH (ref <150)
VLDL: 50 mg/dL — AB (ref 0–40)

## 2013-12-23 LAB — HEMOGLOBIN A1C
Hgb A1c MFr Bld: 5.2 % (ref <5.7)
Mean Plasma Glucose: 103 mg/dL (ref <117)

## 2013-12-23 LAB — CBC
HEMATOCRIT: 35.9 % — AB (ref 36.0–46.0)
HEMOGLOBIN: 11.8 g/dL — AB (ref 12.0–15.0)
MCH: 31.3 pg (ref 26.0–34.0)
MCHC: 32.9 g/dL (ref 30.0–36.0)
MCV: 95.2 fL (ref 78.0–100.0)
Platelets: 294 10*3/uL (ref 150–400)
RBC: 3.77 MIL/uL — AB (ref 3.87–5.11)
RDW: 13.2 % (ref 11.5–15.5)
WBC: 8 10*3/uL (ref 4.0–10.5)

## 2013-12-23 LAB — HEPATIC FUNCTION PANEL
ALT: 14 U/L (ref 0–35)
AST: 20 U/L (ref 0–37)
Albumin: 3.7 g/dL (ref 3.5–5.2)
Alkaline Phosphatase: 96 U/L (ref 39–117)
BILIRUBIN TOTAL: 0.4 mg/dL (ref 0.3–1.2)
Bilirubin, Direct: <0.2 mg/dL (ref 0.0–0.3)
TOTAL PROTEIN: 7.4 g/dL (ref 6.0–8.3)

## 2013-12-23 LAB — TROPONIN I: Troponin I: <0.3 ng/mL (ref <0.30)

## 2013-12-23 MED ORDER — PANTOPRAZOLE SODIUM 40 MG PO TBEC: 40.0000 mg | DELAYED_RELEASE_TABLET | Freq: Two times a day (BID) | ORAL | Status: DC

## 2013-12-23 MED ORDER — METOPROLOL SUCCINATE ER 50 MG PO TB24
50.0000 mg | ORAL_TABLET | Freq: Two times a day (BID) | ORAL | Status: DC
  Administered 2013-12-23: 50 mg via ORAL
  Filled 2013-12-23 (×2): qty 1

## 2013-12-23 MED ORDER — METOPROLOL SUCCINATE ER 50 MG PO TB24: 50.0000 mg | ORAL_TABLET | Freq: Two times a day (BID) | ORAL | Status: DC

## 2013-12-23 NOTE — Progress Notes (Signed)
Utilization review completed.  

## 2013-12-23 NOTE — Discharge Summary (Signed)
Physician Discharge Summary       Patient ID: Olivia Wade MRN: [id number] DOB/AGE: 62-01-1951 62 y.o.  Admit date: 12/22/2013 Discharge date: 12/23/2013  Discharge Diagnoses:  Principal Problem:   Chest pain at rest, negative MI, probable GI Active Problems:   CAD (coronary artery disease), recent DES stents to RCA and LAD in 3/15 and 4/15   Hyperlipidemia   GERD (gastroesophageal reflux disease)   Syncope, secondary to NTG and standing   Sinus tachycardia, with exertion   Discharged Condition: good  Procedures: none  PRIMARY CARDIOLOGIST:  Dr. Rachelle Hora Course:  63 year old female with long-standing history of significant tobacco use and started smoking in 1977 and has smoked up to 3 packs per day for many years, but stopped 08/2013. She has a history of chronic low back pain and has been evaluated by Dr. Gerlene Fee. She has also a documented asymptomatic abdominal aortic aneurysm and on last testing one year ago this measured 3.3x3.3 cm. Additional problems include COPD/asthma. She is status post laparoscopic Nissen fundoplication for severe reflux is also status post cholecystectomy and hysterectomy remotely. In July 2014 she did have an echo Doppler study which showed mild LVH with normal systolic function. She did have grade 1 diastolic dysfunction. There was evidence for mild mitral regurgitation. A nuclear perfusion study was done several years ago which showed normal perfusion without scar or ischemia in 2012.  She developed severe chest pain on 09/08/2013, which time her initial ECG showed hyperacute anterior T waves, which did improve with nitroglycerin and heparin. She continued to experience chest pain was taken to the cardiac catheterization laboratory and was found to have subtotal proximal LAD stenosis associated with moderate mid distal anterolateral hypocontractility. She also had a 90% Napkin ring like stenosis in her proximal RCA. On 09/08/2013 she underwent  successful PCI with cutting balloon, PTCA, and DES stenting of her proximal LAD with insertion of a 3.0x23 mm size helpline stent, postdilated to 3.25 mm. 2 days later, she underwent successful intervention to the 90% proximal RCA stenosis and had a 3.0x15 mm on its helpline stent, postdilated to 3.25 mm. Subsequently, she had felt significantly improved,no further chest pain and she walks daily. She was seen by Dr. Tresa Endo complaining of experiencing episodes of palpitations 10/08/13- the evening before visit, it was associated with shortness of breath. She again had 3 spells of palpitations early on morning of admit.  12/22/13 she just did not feel well, weak and nauseated, though the nausea she has been having along with no appetite. She then developed chest tightness and "heartburn" similar to MI but in slightly different place. She did take NTG but was standing, or sitting then stood, and while talking on the phone passed out. Her granddaughter who was on the phone called EMS. When pt came to she did feel no further pain.   In ER troponin neg. EKG overall is improved from last EKG in April. No pain no, no complaints. Prior to day of admit she felt well. Pt admitted to [facility] for further eval.  troponins all negative, she had no further pain.  Her HR did climb to 144 during the night with walking to BR.  Pt does drink [facility], increased caffeine and sweets.  Also noted to have elevated TG.    We increased her toprol, and discussed decreasing sweets and caffeine. She did stop tobacco in March.  She was seen and evaluated by Dr. Tresa Endo and found stable for discharge.  We will order Nuc study as outpt if symptoms continue including tachycardia.    Consults: None  Significant Diagnostic Studies:  BMET    Component Value Date/Time   NA 138 12/23/2013 0420   K 4.6 12/23/2013 0420   CL 101 12/23/2013 0420   CO2 21 12/23/2013 0420   GLUCOSE 108* 12/23/2013 0420   BUN 12 12/23/2013 0420   CREATININE 0.97  12/23/2013 0420   CREATININE 0.98 10/17/2013 0858   CALCIUM 9.1 12/23/2013 0420   GFRNONAA 62* 12/23/2013 0420   GFRAA 72* 12/23/2013 0420    CBC    Component Value Date/Time   WBC 8.0 12/23/2013 0420   RBC 3.77* 12/23/2013 0420   HGB 11.8* 12/23/2013 0420   HCT 35.9* 12/23/2013 0420   PLT 294 12/23/2013 0420   MCV 95.2 12/23/2013 0420   MCH 31.3 12/23/2013 0420   MCHC 32.9 12/23/2013 0420   RDW 13.2 12/23/2013 0420   LYMPHSABS 1.7 12/22/2013 1350   MONOABS 0.4 12/22/2013 1350   EOSABS 0.0 12/22/2013 1350   BASOSABS 0.0 12/22/2013 1350    Lipid Panel     Component Value Date/Time   CHOL 132 12/23/2013 0420   TRIG 250* 12/23/2013 0420   HDL 32* 12/23/2013 0420   CHOLHDL 4.1 12/23/2013 0420   VLDL 50* 12/23/2013 0420   LDLCALC 50 12/23/2013 0420    Troponin <0.30 X 3 HGBA1C 5.2 TSH 0.665  EKG: Normal sinus rhythm T wave abnormality, consider anterior ischemia Abnormal ECG though still improved from April EKG   PCXR: CHEST 2 VIEW  COMPARISON: 12/02/2013  FINDINGS:  Cardiac shadow is within normal limits. The lungs are clear  bilaterally. Mild interstitial changes are again seen. No acute bony  abnormality is noted. No pneumothorax is seen.  IMPRESSION:  No acute abnormality noted   Discharge Exam: Blood pressure 114/71, pulse 107, temperature 98.1 F (36.7 C), temperature source Oral, resp. rate 18, height 5\' 4"  (1.626 m), weight 142 lb 12.8 oz (64.774 kg), SpO2 96.00%.  Disposition: 01-Home or Self Care     Medication List         acetaminophen 325 MG tablet  Commonly known as:  TYLENOL  Take 325 mg by mouth every 6 (six) hours as needed for moderate pain.     albuterol 108 (90 BASE) MCG/ACT inhaler  Commonly known as:  PROVENTIL HFA;VENTOLIN HFA  Inhale 1 puff into the lungs every 6 (six) hours as needed for wheezing or shortness of breath.     albuterol (2.5 MG/3ML) 0.083% nebulizer solution  Commonly known as:  PROVENTIL  Take 2.5 mg by nebulization every 6 (six)  hours as needed for wheezing or shortness of breath.     ALPRAZolam 0.5 MG tablet  Commonly known as:  XANAX  Take 0.5 mg by mouth 3 (three) times daily as needed for anxiety.     aspirin 81 MG tablet  Take 81 mg by mouth daily.     atorvastatin 40 MG tablet  Commonly known as:  LIPITOR  Take 40 mg by mouth daily.     citalopram 20 MG tablet  Commonly known as:  CELEXA  Take 20 mg by mouth daily.     cyclobenzaprine 10 MG tablet  Commonly known as:  FLEXERIL  Take 1 tablet (10 mg total) by mouth 3 (three) times daily as needed for muscle spasms.     lisinopril 5 MG tablet  Commonly known as:  PRINIVIL,ZESTRIL  Take 1 tablet (5 mg total) by  mouth daily.     metoprolol succinate 50 MG 24 hr tablet  Commonly known as:  TOPROL-XL  Take 25-50 mg by mouth 2 (two) times daily. Take one tablet in the morning and a half a tablet at night.     montelukast 10 MG tablet  Commonly known as:  SINGULAIR  Take 10 mg by mouth at bedtime.     nitroGLYCERIN 0.4 MG SL tablet  Commonly known as:  NITROSTAT  Place 1 tablet (0.4 mg total) under the tongue every 5 (five) minutes as needed for chest pain.     pantoprazole 40 MG tablet  Commonly known as:  PROTONIX  Take 1 tablet (40 mg total) by mouth 2 (two) times daily. Take protonix twice a day for 2 weeks then back to once daily     ticagrelor 90 MG Tabs tablet  Commonly known as:  BRILINTA  Take 1 tablet (90 mg total) by mouth 2 (two) times daily.     traMADol 50 MG tablet  Commonly known as:  ULTRAM  Take 50 mg by mouth every 4 (four) hours as needed (pain).       Follow-up Information   [facility] On 01/13/2014. (at 10:00AM)    Specialty:  Cardiology   Contact information:   [address] [place] [address] [phone]        Discharge Instructions: Take 1 NTG, under your tongue, while sitting.  If no relief of pain may repeat NTG, one tab every 5 minutes up to 3 tablets total over  15 minutes.  If no relief CALL 911.  If you have dizziness/lightheadness  while taking NTG, stop taking and call 911.    Do not stand up for at least 10 min to 15 min after last NTG.  Decrease caffeine and sweets.  Only 1 caffeinated beverage a day.  Sweets are making your triglycerides too high.  Caffeine is making your heart rate too high.    We increased your metoprolol.  If your heart rate still beats fast call the office.    Call if recurrent chest pain.  Call if any questions.        We also increased your Protonix to twice a day for 2 weeks then may go back to once daily.   Signed: Leone BrandINGOLD,LAURA R Nurse Practitioner-Certified [facility]: [facility] 12/23/2013, 11:50 AM  Time spent on discharge : > 35 minutes.

## 2013-12-23 NOTE — Discharge Instructions (Signed)
Take 1 NTG, under your tongue, while sitting.  If no relief of pain may repeat NTG, one tab every 5 minutes up to 3 tablets total over 15 minutes.  If no relief CALL 911.  If you have dizziness/lightheadness  while taking NTG, stop taking and call 911.    Do not stand up for at least 10 min to 15 min after last NTG.  Decrease caffeine and sweets.  Only 1 caffeinated beverage a day.  Sweets are making your triglycerides too high.  Caffeine is making your heart rate too high.    We increased your metoprolol.  If your heart rate still beats fast call the office.    Call if recurrent chest pain.  Call if any questions.        We also increased your Protonix to twice a day for 2 weeks then may go back to once daily.

## 2013-12-23 NOTE — Progress Notes (Signed)
Subjective: No complaints  Objective: Vital signs in last 24 hours: Temp:  [97.6 F (36.4 C)-98.1 F (36.7 C)] 98.1 F (36.7 C) (07/15 0400) Pulse Rate:  [73-107] 107 (07/15 0400) Resp:  [12-22] 18 (07/15 0400) BP: (104-138)/(53-94) 114/71 mmHg (07/15 0400) SpO2:  [96 %-100 %] 96 % (07/15 0400) Weight:  [142 lb 12.8 oz (64.774 kg)-142 lb 14.4 oz (64.819 kg)] 142 lb 12.8 oz (64.774 kg) (07/15 0434) Weight change:  Last BM Date: 12/22/13 Intake/Output from previous day: +480 07/14 0701 - 07/15 0700 In: 480 [P.O.:480] Out: -  Intake/Output this shift:    PE: General:Pleasant affect, NAD Skin:Warm and dry, brisk capillary refill HEENT:normocephalic, sclera clear, mucus membranes moist Neck:supple, no JVD, no bruits  Heart:S1S2 RRR without murmur, gallup, rub or click Lungs:clear without rales, rhonchi, or wheezes RUE:AVWUAbd:soft, non tender, + BS, do not palpate liver spleen or masses Ext:no lower ext edema, 2+ pedal pulses, 2+ radial pulses Neuro:alert and oriented, MAE, follows commands, + facial symmetry  Tele:  St at 144 at times, otherwise SR.   Lab Results:  Recent Labs  12/22/13 1350 12/23/13 0420  WBC 8.8 8.0  HGB 11.3* 11.8*  HCT 33.7* 35.9*  PLT 264 294   BMET  Recent Labs  12/22/13 1350 12/23/13 0420  NA 135* 138  K 4.6 4.6  CL 97 101  CO2 20 21  GLUCOSE 99 108*  BUN 14 12  CREATININE 1.16* 0.97  CALCIUM 8.8 9.1    Recent Labs  12/22/13 2215 12/23/13 0427  TROPONINI <0.30 <0.30    Lab Results  Component Value Date   CHOL 132 12/23/2013   HDL 32* 12/23/2013   LDLCALC 50 12/23/2013   TRIG 250* 12/23/2013   CHOLHDL 4.1 12/23/2013   Lab Results  Component Value Date   HGBA1C 5.2 12/22/2013     Lab Results  Component Value Date   TSH 0.665 12/22/2013    Hepatic Function Panel  Recent Labs  12/23/13 0420  PROT 7.4  ALBUMIN 3.7  AST 20  ALT 14  ALKPHOS 96  BILITOT 0.4  BILIDIR <0.2  IBILI NOT CALCULATED    Recent  Labs  12/23/13 0420  CHOL 132   No results found for this basename: PROTIME,  in the last 72 hours     Studies/Results: Dg Chest 2 View  12/22/2013   CLINICAL DATA:  Chest pain  EXAM: CHEST  2 VIEW  COMPARISON:  12/02/2013  FINDINGS: Cardiac shadow is within normal limits. The lungs are clear bilaterally. Mild interstitial changes are again seen. No acute bony abnormality is noted. No pneumothorax is seen.  IMPRESSION: No acute abnormality noted.   Electronically Signed   By: Alcide CleverMark  Lukens M.D.   On: 12/22/2013 14:17    Medications: I have reviewed the patient's current medications. Scheduled Meds: . aspirin  324 mg Oral NOW   Or  . aspirin  300 mg Rectal NOW  . aspirin  81 mg Oral Daily  . aspirin EC  81 mg Oral Daily  . atorvastatin  40 mg Oral Daily  . citalopram  20 mg Oral Daily  . heparin  5,000 Units Subcutaneous 3 times per day  . lisinopril  5 mg Oral Daily  . metoprolol succinate  25-50 mg Oral BID  . montelukast  10 mg Oral QHS  . pantoprazole  40 mg Oral Daily  . ticagrelor  90 mg Oral BID   Continuous Infusions:  PRN  Meds:.acetaminophen, acetaminophen, albuterol, ALPRAZolam, cyclobenzaprine, nitroGLYCERIN, nitroGLYCERIN, ondansetron (ZOFRAN) IV, traMADol, zolpidem  Assessment/Plan: Principal Problem:   Chest pain at rest, neg MI, slightly more pronounced T wave inversions in ant. Lat leads though still not as pronounced as 10/11/13 - no further pain- possible nuc study today, NPO- Dr. Tresa Endo to see. Active Problems:   CAD (coronary artery disease), recent DES stents to RCA and LAD in 3/15 and 4/15   Hyperlipidemia   GERD (gastroesophageal reflux disease)   Syncope, secondary to NTG and then standing quickly   ST- HR up to 144 at times on monitor- BP 114 systolic, on toprol 25 in AM and 50 in PM will increase, her HR is fast with walking at home.   LOS: 1 day   Time spent with pt. :15 minutes. [facility] R  Nurse Practitioner Certified Pager [phone] or after  5pm and on weekends call [phone] 12/23/2013, 7:58 AM   Patient seen and examined. Agree with assessment and plan. No recurrent pain. Not well beta-blocked with increased HR with mild activity. Favor increase metoprolol to 50 mg bid, but may need higher doses which can be titrated as outpatient. Discussed increase in TG and decreased in sugar content; may need combination therapy with addition of niacin or fenofibrate to statin. Will dc this am. Plan pharmacologic nuclear study as outpatient and ov f/u   Lennette Bihari, MD, [facility] 12/23/2013 8:52 AM

## 2013-12-24 NOTE — Progress Notes (Signed)
Pt up walking in hall after taking increased dose metoprolol, HR remained below 110. Educated pt to avoid caffeine. Emelda Brothers RN

## 2014-01-07 ENCOUNTER — Other Ambulatory Visit: Payer: Self-pay

## 2014-01-07 MED ORDER — ATORVASTATIN CALCIUM 40 MG PO TABS: 40.0000 mg | ORAL_TABLET | Freq: Every day | ORAL | Status: DC

## 2014-01-07 NOTE — Telephone Encounter (Signed)
Rx was sent to pharmacy electronically. 

## 2014-01-11 ENCOUNTER — Encounter: Payer: Self-pay | Admitting: *Deleted

## 2014-01-13 ENCOUNTER — Encounter: Payer: Self-pay | Admitting: Cardiology

## 2014-01-13 ENCOUNTER — Ambulatory Visit (INDEPENDENT_AMBULATORY_CARE_PROVIDER_SITE_OTHER): Payer: BC Managed Care – PPO | Admitting: Cardiology

## 2014-01-13 VITALS — BP 130/82 | HR 58 | Ht 64.0 in | Wt 144.4 lb

## 2014-01-13 DIAGNOSIS — I25110 Atherosclerotic heart disease of native coronary artery with unstable angina pectoris: Secondary | ICD-10-CM

## 2014-01-13 DIAGNOSIS — E785 Hyperlipidemia, unspecified: Secondary | ICD-10-CM

## 2014-01-13 DIAGNOSIS — I2510 Atherosclerotic heart disease of native coronary artery without angina pectoris: Secondary | ICD-10-CM

## 2014-01-13 DIAGNOSIS — F17200 Nicotine dependence, unspecified, uncomplicated: Secondary | ICD-10-CM

## 2014-01-13 DIAGNOSIS — M545 Low back pain, unspecified: Secondary | ICD-10-CM

## 2014-01-13 DIAGNOSIS — I10 Essential (primary) hypertension: Secondary | ICD-10-CM

## 2014-01-13 DIAGNOSIS — I208 Other forms of angina pectoris: Secondary | ICD-10-CM

## 2014-01-13 DIAGNOSIS — I200 Unstable angina: Secondary | ICD-10-CM

## 2014-01-13 DIAGNOSIS — Z720 Tobacco use: Secondary | ICD-10-CM

## 2014-01-13 DIAGNOSIS — G8929 Other chronic pain: Secondary | ICD-10-CM

## 2014-01-13 MED ORDER — ISOSORBIDE MONONITRATE ER 30 MG PO TB24: 30.0000 mg | ORAL_TABLET | Freq: Every day | ORAL | Status: DC

## 2014-01-13 MED ORDER — TRAMADOL HCL 50 MG PO TABS: 100.0000 mg | ORAL_TABLET | Freq: Four times a day (QID) | ORAL | Status: DC | PRN

## 2014-01-13 NOTE — Progress Notes (Signed)
01/13/2014   PCP: Ernestine Conrad, MD   Chief Complaint  Patient presents with  . Follow-up    post hospital for Chest Pain, pt c/o of SOB and weakness, little chest pain at times.    Primary Cardiologist:Dr. Bishop Limbo   HPI:  63 year old female with long-standing history of significant tobacco use and started smoking in 1977 and has smoked up to 3 packs per day for many years, but stopped 08/2013. She has a history of chronic low back pain and has been evaluated by Dr. Gerlene Fee. She has also a documented asymptomatic abdominal aortic aneurysm and on last testing one year ago this measured 3.3x3.3 cm. Additional problems include COPD/asthma. She is status post laparoscopic Nissen fundoplication for severe reflux is also status post cholecystectomy and hysterectomy remotely. In July 2014 she did have an echo Doppler study which showed mild LVH with normal systolic function. She did have grade 1 diastolic dysfunction. There was evidence for mild mitral regurgitation. A nuclear perfusion study was done several years ago which showed normal perfusion without scar or ischemia in 2012.  She developed severe chest pain on 09/08/2013, which time her initial ECG showed hyperacute anterior T waves, which did improve with nitroglycerin and heparin. She continued to experience chest pain was taken to the cardiac catheterization laboratory and was found to have subtotal proximal LAD stenosis associated with moderate mid distal anterolateral hypocontractility. She also had a 90% Napkin ring like stenosis in her proximal RCA. On 09/08/2013 she underwent successful PCI with cutting balloon, PTCA, and DES stenting of her proximal LAD with insertion of a 3.0x23 mm size helpline stent, postdilated to 3.25 mm. 2 days later, she underwent successful intervention to the 90% proximal RCA stenosis and had a 3.0x15 mm on its helpline stent, postdilated to 3.25 mm. Subsequently, she had felt significantly improved,no  further chest pain and she walks daily. She was seen by Dr. Tresa Endo complaining of experiencing episodes of palpitations 10/08/13- the evening before visit, it was associated with shortness of breath. She again had 3 spells of palpitations early on morning of admit.  12/22/13 she just did not feel well, weak and nauseated, though the nausea she has been having along with no appetite. She then developed chest tightness and "heartburn" similar to MI but in slightly different place. She did take NTG but was standing, or sitting then stood, and while talking on the phone passed out. Her granddaughter who was on the phone called EMS. When pt came to she did feel no further pain  Patient was negative for MI,  she was instructed to lie down when taking nitroglycerin and not to get out for at least 20 minutes. It was decided she could be discharged but his symptoms continued she would need outpatient stress test.  She is back today for followup, she continues with left anterior chest pain but also pain across her back just below her scapulas.  She has been taking Ultram with some relief of the back pain.  She is very anxious about her symptoms as well. She hasn't associated shortness of breath and some nausea.     Allergies  Allergen Reactions  . Bee Venom Anaphylaxis, Hives and Swelling  . Penicillins Other (See Comments)    PATIENT REPORTS "MOTHER TOLD HER SHE HAD ALLERGY TO PCN"  . Morphine And Related Rash    Current Outpatient Prescriptions  Medication Sig Dispense Refill  . acetaminophen (TYLENOL) 325  MG tablet Take 325 mg by mouth every 6 (six) hours as needed for moderate pain.      Marland Kitchen albuterol (PROVENTIL HFA;VENTOLIN HFA) 108 (90 BASE) MCG/ACT inhaler Inhale 1 puff into the lungs every 6 (six) hours as needed for wheezing or shortness of breath.      Marland Kitchen albuterol (PROVENTIL) (2.5 MG/3ML) 0.083% nebulizer solution Take 2.5 mg by nebulization every 6 (six) hours as needed for wheezing or shortness of  breath.      . ALPRAZolam (XANAX) 0.5 MG tablet Take 0.5 mg by mouth 3 (three) times daily as needed for anxiety.      Marland Kitchen aspirin 81 MG tablet Take 81 mg by mouth daily.      Marland Kitchen atorvastatin (LIPITOR) 40 MG tablet Take 1 tablet (40 mg total) by mouth daily.  30 tablet  9  . citalopram (CELEXA) 20 MG tablet Take 20 mg by mouth daily.      . cyclobenzaprine (FLEXERIL) 10 MG tablet Take 1 tablet (10 mg total) by mouth 3 (three) times daily as needed for muscle spasms.  30 tablet  6  . lisinopril (PRINIVIL,ZESTRIL) 5 MG tablet Take 1 tablet (5 mg total) by mouth daily.  30 tablet  9  . metoprolol succinate (TOPROL-XL) 50 MG 24 hr tablet Take 1 tablet (50 mg total) by mouth 2 (two) times daily. Take with or immediately following a meal.  60 tablet  6  . montelukast (SINGULAIR) 10 MG tablet Take 10 mg by mouth at bedtime.      . nitroGLYCERIN (NITROSTAT) 0.4 MG SL tablet Place 1 tablet (0.4 mg total) under the tongue every 5 (five) minutes as needed for chest pain.  25 tablet  2  . pantoprazole (PROTONIX) 40 MG tablet Take 1 tablet (40 mg total) by mouth 2 (two) times daily. Take protonix twice a day for 2 weeks then back to once daily  60 tablet  1  . Ticagrelor (BRILINTA) 90 MG TABS tablet Take 1 tablet (90 mg total) by mouth 2 (two) times daily.  60 tablet  10  . isosorbide mononitrate (IMDUR) 30 MG 24 hr tablet Take 1 tablet (30 mg total) by mouth daily.  30 tablet  3  . traMADol (ULTRAM) 50 MG tablet Take 2 tablets (100 mg total) by mouth every 6 (six) hours as needed.  30 tablet  0   No current facility-administered medications for this visit.    Past Medical History  Diagnosis Date  . AAA (abdominal aortic aneurysm)   . Tobacco abuse   . HTN (hypertension)   . COPD (chronic obstructive pulmonary disease)   . Asthma   . HLD (hyperlipidemia)   . GERD (gastroesophageal reflux disease)   . Chronic low back pain     (Old vertebral fracture)  . Arthritis   . Coronary artery disease 09/08/13     with PCI  . NSTEMI (non-ST elevated myocardial infarction) 09/08/13  . Heart palpitations     prn toprol for rapid HR    Past Surgical History  Procedure Laterality Date  . Coronary stent placement  09/08/13    PCI with cutting balloon, PTCA,DES to prox LAD  . Coronary angioplasty with stent placement  09/10/13    PCI of RCA 90% proximal RCA stenosis with DES  . Laparoscopic nissen fundoplication      for severe reflux  . Cholecystectomy    . Abdominal hysterectomy    . 2d echo  12/2012    normal LV  with mild LVH, grade 1 diastolic dysfunction  . Doppler echocardiography  12/31/2012  . Nuclear stress test  05/09/2011    NORMAL PATTREN OF PERFUSION IN ALL REGIONS. LV IS NORMAL IS SIZE. EF 68% NO WALL MOTION ABNORMALITIES.  Marland Kitchen. Abdominal aortic duplex  05/19/2012    WHEN COMPARED TO PRIOR STUDY DATED 06/01/11 THERE IS AN INCREASE IN SIZE OF DILATATION.    ZOX:WRUEAVW:UJROS:General:no colds or fevers, no weight changes Skin:no rashes or ulcers HEENT:no blurred vision, no congestion CV:see HPI PUL:see HPI GI:no diarrhea constipation or melena, no indigestion GU:no hematuria, no dysuria MS:no joint pain, no claudication, + back pain Neuro:no syncope, no lightheadedness Endo:no diabetes, no thyroid disease  Wt Readings from Last 3 Encounters:  01/13/14 144 lb 6.4 oz (65.499 kg)  12/23/13 142 lb 12.8 oz (64.774 kg)  10/08/13 144 lb 6.4 oz (65.499 kg)   Lipid Panel     Component Value Date/Time   CHOL 132 12/23/2013 0420   TRIG 250* 12/23/2013 0420   HDL 32* 12/23/2013 0420   CHOLHDL 4.1 12/23/2013 0420   VLDL 50* 12/23/2013 0420   LDLCALC 50 12/23/2013 0420     PHYSICAL EXAM BP 130/82  Pulse 58  Ht 5\' 4"  (1.626 m)  Wt 144 lb 6.4 oz (65.499 kg)  BMI 24.77 kg/m2 General:Pleasant affect, NAD Skin:Warm and dry, brisk capillary refill HEENT:normocephalic, sclera clear, mucus membranes moist Neck:supple, no JVD, no bruits  Heart:S1S2 RRR without murmur, gallup, rub or click Lungs:clear  without rales, rhonchi, or wheezes WJX:BJYNAbd:soft, non tender, + BS, do not palpate liver spleen or masses Ext:no lower ext edema, 2+ pedal pulses, 2+ radial pulses, no pain to palpation across her back. Neuro:alert and oriented, MAE, follows commands, + facial symmetry  EKG: S brady non specific t wave changes, no acute changes.  ASSESSMENT AND PLAN CAD (coronary artery disease), recent DES stents to RCA and LAD in 3/15 and 4/15 Ongoing discomfort, recent hospitalization post stents for chest pain and syncope the syncope related to nitroglycerin and then standing up quickly, negative cardiac enzymes. But due to the fact the symptoms continue will do a Lexa scan Myoview to further evaluate for ischemia. She is unable to walk on the treadmill due to back pain that's chronic.  Imdur 30 mg daily was added to her medical regimen as well  HTN (hypertension) Controlled  Chronic low back pain I increased her Ultram 200 mg every 6 hours, prn.  Hyperlipidemia On Lipitor 40 mg daily.  Tobacco abuse Has stopped smoking    She will followup with results with either Dr. Nicholaus BloomKelley or myself on the days in the office.

## 2014-01-13 NOTE — Patient Instructions (Signed)
Your physician has requested that you have a lexiscan myoview. For further information please visit [email or web address] Please follow instruction sheet, as given.  Your physician recommends that you schedule a follow-up appointment within one week after test, with Dr.Kelly, or with Vernona Rieger during a time Dr.Kelly is in the office.

## 2014-01-13 NOTE — Assessment & Plan Note (Addendum)
Ongoing discomfort, recent hospitalization post stents for chest pain and syncope the syncope related to nitroglycerin and then standing up quickly, negative cardiac enzymes. But due to the fact the symptoms continue will do a Lexa scan Myoview to further evaluate for ischemia. She is unable to walk on the treadmill due to back pain that's chronic.  Imdur 30 mg daily was added to her medical regimen as well

## 2014-01-13 NOTE — Assessment & Plan Note (Signed)
Controlled.  

## 2014-01-13 NOTE — Assessment & Plan Note (Signed)
On Lipitor 40 mg daily. 

## 2014-01-13 NOTE — Assessment & Plan Note (Signed)
Has stopped smoking  

## 2014-01-13 NOTE — Assessment & Plan Note (Addendum)
I increased her Ultram 200 mg every 6 hours, prn.

## 2014-01-15 ENCOUNTER — Telehealth (HOSPITAL_COMMUNITY): Payer: Self-pay

## 2014-01-15 NOTE — Telephone Encounter (Signed)
Encounter complete. 

## 2014-01-20 ENCOUNTER — Ambulatory Visit (HOSPITAL_COMMUNITY)
Admission: RE | Admit: 2014-01-20 | Discharge: 2014-01-20 | Disposition: A | Discharge status: Home / Self Care | Payer: BC Managed Care – PPO | Source: Ambulatory Visit | Attending: Cardiovascular Disease | Admitting: Cardiovascular Disease

## 2014-01-20 DIAGNOSIS — I208 Other forms of angina pectoris: Secondary | ICD-10-CM

## 2014-01-20 DIAGNOSIS — I2089 Other forms of angina pectoris: Secondary | ICD-10-CM

## 2014-01-20 MED ORDER — TECHNETIUM TC 99M SESTAMIBI GENERIC - CARDIOLITE
9.4000 | Freq: Once | INTRAVENOUS | Status: AC | PRN
  Administered 2014-01-20: 9 via INTRAVENOUS

## 2014-01-20 MED ORDER — REGADENOSON 0.4 MG/5ML IV SOLN
0.4000 mg | Freq: Once | INTRAVENOUS | Status: AC
  Administered 2014-01-20: 0.4 mg via INTRAVENOUS

## 2014-01-20 MED ORDER — TECHNETIUM TC 99M SESTAMIBI GENERIC - CARDIOLITE
30.2000 | Freq: Once | INTRAVENOUS | Status: AC | PRN
  Administered 2014-01-20: 30.2 via INTRAVENOUS

## 2014-01-20 MED ORDER — AMINOPHYLLINE 25 MG/ML IV SOLN
75.0000 mg | Freq: Once | INTRAVENOUS | Status: AC
  Administered 2014-01-20: 75 mg via INTRAVENOUS

## 2014-01-20 NOTE — Procedures (Addendum)
[facility] [address] [place] [address] [phone]  Cardiology Nuclear Med Study  Olivia Wade is a 63 y.o. female     MRN : [id number]     DOB: Oct 12, 1950  Procedure Date: 01/20/2014  Nuclear Med Background Indication for Stress Test:  [facility] and Abnormal EKG History:  Asthma, COPD and CAD;MI-09/08/2013;STENT/PTCA-09/08/2013;Palpitations;ACS;Tachycardia;Last NUC MPI on 05/09/2011-nonischemic-EF=68%;ECHO on 12/31/2012-mild LVH;EF=55-60% Cardiac Risk Factors: Family History - CAD, History of Smoking, Hypertension and Lipids  Symptoms:  Chest Pain, Dizziness, DOE, Light-Headedness, Nausea, Palpitations, SOB, Syncope and weakness   Nuclear Pre-Procedure Caffeine/Decaff Intake:  1:00am NPO After: 11am   IV Site: R Forearm  IV 0.9% NS with Angio Cath:  22g  Chest Size (in):  n/a IV Started by: Berdie Ogren, RN  Height: 5\' 4"  (1.626 m)  Cup Size: B  BMI:  Body mass index is 24.71 kg/(m^2). Weight:  144 lb (65.318 kg)   Tech Comments:  n/a    Nuclear Med Study 1 or 2 day study: 1 day  Stress Test Type:  Lexiscan  Order Authorizing Provider:  Nicki Guadalajara, MD   Resting Radionuclide: Technetium 17m Sestamibi  Resting Radionuclide Dose: 9.4 mCi   Stress Radionuclide:  Technetium 77m Sestamibi  Stress Radionuclide Dose: 30.2 mCi           Stress Protocol Rest HR: 62 Stress HR: 90  Rest BP: 132/90 Stress BP: 143/80  Exercise Time (min): n/a METS: n/a          Dose of Adenosine (mg):  n/a Dose of Lexiscan: 0.4 mg  Dose of Atropine (mg): n/a Dose of Dobutamine: n/a mcg/kg/min (at max HR)  Stress Test Technologist: Ernestene Mention, CCT Nuclear Technologist: Gonzella Lex, CNMT   Rest Procedure:  Myocardial perfusion imaging was performed at rest 45 minutes following the intravenous administration of Technetium 22m Sestamibi. Stress Procedure:  The patient received IV Lexiscan 0.4 mg over 15-seconds.  Technetium  76m Sestamibi injected IV at 30-seconds.  Patient experienced shortness of breath, dizziness and was administered 75 mg of Aminophylline IV at 5 minutes. There were no significant changes with Lexiscan.  Quantitative spect images were obtained after a 45 minute delay.  Transient Ischemic Dilatation (Normal <1.22):  1.2  QGS EDV:  72 ml QGS ESV:  22 ml LV Ejection Fraction: 69%      Rest ECG: NSR with non-specific ST-T wave changes  Stress ECG: < 1 mm ST depression (horizontal) in inferior and lateral leads  QPS Raw Data Images:  There is interference from nuclear activity from structures below the diaphragm. This does not affect the ability to read the study. Stress Images:  small apicolateral reduction in tracer uptake Rest Images:  Comparison with the stress images reveals no significant change. Subtraction (SDS):  No evidence of ischemia. LV Wall Motion:  NL LV Function; NL Wall Motion EF 69%  Impression Exercise Capacity:  Lexiscan with no exercise. BP Response:  Normal blood pressure response. Clinical Symptoms:  No significant symptoms noted. ECG Impression:  No significant ECG changes with Lexiscan. Comparison with Prior Nuclear Study: No images to compare   Overall Impression:  Low risk stress nuclear study with a possible tiny apicolateral scar. No reversible ischemia is seen.Thurmon Fair, MD  01/20/2014 5:22 PM

## 2014-01-25 ENCOUNTER — Encounter: Payer: Self-pay | Admitting: *Deleted

## 2014-02-05 ENCOUNTER — Ambulatory Visit (INDEPENDENT_AMBULATORY_CARE_PROVIDER_SITE_OTHER): Payer: BC Managed Care – PPO | Admitting: Physician Assistant

## 2014-02-05 ENCOUNTER — Encounter: Payer: Self-pay | Admitting: Physician Assistant

## 2014-02-05 VITALS — BP 127/85 | HR 70 | Ht 64.0 in | Wt 146.0 lb

## 2014-02-05 DIAGNOSIS — R202 Paresthesia of skin: Secondary | ICD-10-CM

## 2014-02-05 DIAGNOSIS — E785 Hyperlipidemia, unspecified: Secondary | ICD-10-CM

## 2014-02-05 DIAGNOSIS — I2510 Atherosclerotic heart disease of native coronary artery without angina pectoris: Secondary | ICD-10-CM

## 2014-02-05 DIAGNOSIS — R209 Unspecified disturbances of skin sensation: Secondary | ICD-10-CM

## 2014-02-05 DIAGNOSIS — Z01818 Encounter for other preprocedural examination: Secondary | ICD-10-CM

## 2014-02-05 DIAGNOSIS — I10 Essential (primary) hypertension: Secondary | ICD-10-CM

## 2014-02-05 DIAGNOSIS — R200 Anesthesia of skin: Secondary | ICD-10-CM

## 2014-02-05 NOTE — Patient Instructions (Signed)
1.  Followup with Dr. Kelly in 3 months. 

## 2014-02-05 NOTE — Assessment & Plan Note (Signed)
On Lipitor 

## 2014-02-05 NOTE — Assessment & Plan Note (Signed)
Blood pressure well controlled
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
## 2014-02-05 NOTE — Assessment & Plan Note (Addendum)
No further angina. she is taking aspirin and brilinta and not missing any doses. She's also on Imdur and Toprol.  Negative nuclear stress test.

## 2014-03-27 ENCOUNTER — Observation Stay (HOSPITAL_COMMUNITY)
Admission: EM | Admit: 2014-03-27 | Discharge: 2014-03-28 | Disposition: A | Discharge status: Home / Self Care | Payer: BC Managed Care – PPO | Attending: Family Medicine | Admitting: Family Medicine

## 2014-03-27 ENCOUNTER — Emergency Department (HOSPITAL_COMMUNITY): Payer: BC Managed Care – PPO

## 2014-03-27 ENCOUNTER — Encounter (HOSPITAL_COMMUNITY): Payer: Self-pay | Admitting: Emergency Medicine

## 2014-03-27 ENCOUNTER — Other Ambulatory Visit (HOSPITAL_COMMUNITY): Payer: Self-pay | Admitting: Cardiology

## 2014-03-27 DIAGNOSIS — J441 Chronic obstructive pulmonary disease with (acute) exacerbation: Secondary | ICD-10-CM | POA: Insufficient documentation

## 2014-03-27 DIAGNOSIS — M1990 Unspecified osteoarthritis, unspecified site: Secondary | ICD-10-CM | POA: Diagnosis not present

## 2014-03-27 DIAGNOSIS — Z880 Allergy status to penicillin: Secondary | ICD-10-CM | POA: Diagnosis not present

## 2014-03-27 DIAGNOSIS — G8929 Other chronic pain: Secondary | ICD-10-CM | POA: Insufficient documentation

## 2014-03-27 DIAGNOSIS — R072 Precordial pain: Secondary | ICD-10-CM | POA: Diagnosis not present

## 2014-03-27 DIAGNOSIS — Z9861 Coronary angioplasty status: Secondary | ICD-10-CM | POA: Diagnosis not present

## 2014-03-27 DIAGNOSIS — I10 Essential (primary) hypertension: Secondary | ICD-10-CM | POA: Insufficient documentation

## 2014-03-27 DIAGNOSIS — Z79899 Other long term (current) drug therapy: Secondary | ICD-10-CM | POA: Insufficient documentation

## 2014-03-27 DIAGNOSIS — R112 Nausea with vomiting, unspecified: Secondary | ICD-10-CM | POA: Insufficient documentation

## 2014-03-27 DIAGNOSIS — R079 Chest pain, unspecified: Secondary | ICD-10-CM | POA: Diagnosis present

## 2014-03-27 DIAGNOSIS — Z7982 Long term (current) use of aspirin: Secondary | ICD-10-CM | POA: Insufficient documentation

## 2014-03-27 DIAGNOSIS — K219 Gastro-esophageal reflux disease without esophagitis: Secondary | ICD-10-CM | POA: Diagnosis not present

## 2014-03-27 DIAGNOSIS — I714 Abdominal aortic aneurysm, without rupture: Secondary | ICD-10-CM | POA: Insufficient documentation

## 2014-03-27 DIAGNOSIS — I2510 Atherosclerotic heart disease of native coronary artery without angina pectoris: Secondary | ICD-10-CM | POA: Insufficient documentation

## 2014-03-27 DIAGNOSIS — E785 Hyperlipidemia, unspecified: Secondary | ICD-10-CM | POA: Diagnosis not present

## 2014-03-27 DIAGNOSIS — I252 Old myocardial infarction: Secondary | ICD-10-CM | POA: Diagnosis not present

## 2014-03-27 DIAGNOSIS — Z87891 Personal history of nicotine dependence: Secondary | ICD-10-CM | POA: Diagnosis not present

## 2014-03-27 HISTORY — DX: Other specified postprocedural states: Z98.890

## 2014-03-27 LAB — BASIC METABOLIC PANEL
Anion gap: 13 (ref 5–15)
BUN: 14 mg/dL (ref 6–23)
CALCIUM: 8.7 mg/dL (ref 8.4–10.5)
CO2: 21 mEq/L (ref 19–32)
Chloride: 104 mEq/L (ref 96–112)
Creatinine, Ser: 0.92 mg/dL (ref 0.50–1.10)
GFR calc non Af Amer: 65 mL/min — ABNORMAL LOW (ref >90)
GFR, EST AFRICAN AMERICAN: 76 mL/min — AB (ref >90)
GLUCOSE: 80 mg/dL (ref 70–99)
POTASSIUM: 3.5 meq/L — AB (ref 3.7–5.3)
Sodium: 138 mEq/L (ref 137–147)

## 2014-03-27 LAB — CBC WITH DIFFERENTIAL/PLATELET
BASOS PCT: 0 % (ref 0–1)
Basophils Absolute: 0 10*3/uL (ref 0.0–0.1)
EOS ABS: 0.1 10*3/uL (ref 0.0–0.7)
EOS PCT: 1 % (ref 0–5)
HEMATOCRIT: 33.5 % — AB (ref 36.0–46.0)
HEMOGLOBIN: 11.4 g/dL — AB (ref 12.0–15.0)
LYMPHS ABS: 3.2 10*3/uL (ref 0.7–4.0)
Lymphocytes Relative: 48 % — ABNORMAL HIGH (ref 12–46)
MCH: 31.8 pg (ref 26.0–34.0)
MCHC: 34 g/dL (ref 30.0–36.0)
MCV: 93.6 fL (ref 78.0–100.0)
MONO ABS: 0.6 10*3/uL (ref 0.1–1.0)
MONOS PCT: 9 % (ref 3–12)
Neutro Abs: 2.8 10*3/uL (ref 1.7–7.7)
Neutrophils Relative %: 42 % — ABNORMAL LOW (ref 43–77)
Platelets: 262 10*3/uL (ref 150–400)
RBC: 3.58 MIL/uL — AB (ref 3.87–5.11)
RDW: 13.3 % (ref 11.5–15.5)
WBC: 6.7 10*3/uL (ref 4.0–10.5)

## 2014-03-27 LAB — TROPONIN I
Troponin I: <0.3 ng/mL (ref <0.30)
Troponin I: <0.3 ng/mL (ref <0.30)

## 2014-03-27 LAB — PRO B NATRIURETIC PEPTIDE: PRO B NATRI PEPTIDE: 69.9 pg/mL (ref 0–125)

## 2014-03-27 MED ORDER — METOPROLOL SUCCINATE ER 50 MG PO TB24
50.0000 mg | ORAL_TABLET | Freq: Two times a day (BID) | ORAL | Status: DC
  Filled 2014-03-27 (×2): qty 1

## 2014-03-27 MED ORDER — DOCUSATE SODIUM 100 MG PO CAPS: 100.0000 mg | ORAL_CAPSULE | Freq: Every day | ORAL | Status: DC | PRN

## 2014-03-27 MED ORDER — NITROGLYCERIN 0.4 MG SL SUBL: 0.4000 mg | SUBLINGUAL_TABLET | SUBLINGUAL | Status: DC | PRN

## 2014-03-27 MED ORDER — ATORVASTATIN CALCIUM 40 MG PO TABS
40.0000 mg | ORAL_TABLET | Freq: Every day | ORAL | Status: DC
  Administered 2014-03-27 – 2014-03-28 (×2): 40 mg via ORAL
  Filled 2014-03-27 (×2): qty 1

## 2014-03-27 MED ORDER — TICAGRELOR 90 MG PO TABS
90.0000 mg | ORAL_TABLET | Freq: Two times a day (BID) | ORAL | Status: DC
  Administered 2014-03-28: 90 mg via ORAL
  Filled 2014-03-27 (×4): qty 1

## 2014-03-27 MED ORDER — ALPRAZOLAM 0.5 MG PO TABS
0.5000 mg | ORAL_TABLET | Freq: Every day | ORAL | Status: DC
  Administered 2014-03-27: 0.5 mg via ORAL
  Filled 2014-03-27: qty 1

## 2014-03-27 MED ORDER — ONDANSETRON HCL 4 MG/2ML IJ SOLN: 4.0000 mg | Freq: Four times a day (QID) | INTRAMUSCULAR | Status: DC | PRN

## 2014-03-27 MED ORDER — MONTELUKAST SODIUM 10 MG PO TABS
10.0000 mg | ORAL_TABLET | Freq: Every day | ORAL | Status: DC
  Administered 2014-03-27: 10 mg via ORAL
  Filled 2014-03-27: qty 1

## 2014-03-27 MED ORDER — ASPIRIN EC 81 MG PO TBEC
81.0000 mg | DELAYED_RELEASE_TABLET | Freq: Every day | ORAL | Status: DC
  Administered 2014-03-27 – 2014-03-28 (×2): 81 mg via ORAL
  Filled 2014-03-27 (×3): qty 1

## 2014-03-27 MED ORDER — ACETAMINOPHEN 650 MG RE SUPP: 650.0000 mg | Freq: Four times a day (QID) | RECTAL | Status: DC | PRN

## 2014-03-27 MED ORDER — CITALOPRAM HYDROBROMIDE 20 MG PO TABS
20.0000 mg | ORAL_TABLET | Freq: Every day | ORAL | Status: DC
  Administered 2014-03-27 – 2014-03-28 (×2): 20 mg via ORAL
  Filled 2014-03-27 (×2): qty 1

## 2014-03-27 MED ORDER — ISOSORBIDE MONONITRATE ER 60 MG PO TB24
30.0000 mg | ORAL_TABLET | Freq: Every evening | ORAL | Status: DC
  Administered 2014-03-27: 30 mg via ORAL
  Filled 2014-03-27: qty 1

## 2014-03-27 MED ORDER — CYCLOBENZAPRINE HCL 10 MG PO TABS
10.0000 mg | ORAL_TABLET | Freq: Three times a day (TID) | ORAL | Status: DC | PRN
  Administered 2014-03-27: 10 mg via ORAL
  Filled 2014-03-27: qty 1

## 2014-03-27 MED ORDER — ALUM & MAG HYDROXIDE-SIMETH 200-200-20 MG/5ML PO SUSP: 30.0000 mL | Freq: Four times a day (QID) | ORAL | Status: DC | PRN

## 2014-03-27 MED ORDER — ONDANSETRON HCL 4 MG/2ML IJ SOLN
4.0000 mg | Freq: Once | INTRAMUSCULAR | Status: AC
  Administered 2014-03-27: 4 mg via INTRAVENOUS
  Filled 2014-03-27: qty 2

## 2014-03-27 MED ORDER — ACETAMINOPHEN 325 MG PO TABS: 650.0000 mg | ORAL_TABLET | Freq: Four times a day (QID) | ORAL | Status: DC | PRN

## 2014-03-27 MED ORDER — PANTOPRAZOLE SODIUM 40 MG PO TBEC
40.0000 mg | DELAYED_RELEASE_TABLET | Freq: Every morning | ORAL | Status: DC
  Administered 2014-03-28: 40 mg via ORAL
  Filled 2014-03-27: qty 1

## 2014-03-27 MED ORDER — LISINOPRIL 5 MG PO TABS
5.0000 mg | ORAL_TABLET | Freq: Every day | ORAL | Status: DC
  Administered 2014-03-27: 5 mg via ORAL
  Filled 2014-03-27 (×2): qty 1

## 2014-03-27 MED ORDER — SODIUM CHLORIDE 0.9 % IJ SOLN
3.0000 mL | Freq: Two times a day (BID) | INTRAMUSCULAR | Status: DC
  Administered 2014-03-27 – 2014-03-28 (×2): 3 mL via INTRAVENOUS

## 2014-03-27 MED ORDER — HYDROMORPHONE HCL 1 MG/ML IJ SOLN: 0.5000 mg | INTRAMUSCULAR | Status: DC | PRN

## 2014-03-27 MED ORDER — ZOLPIDEM TARTRATE 5 MG PO TABS
5.0000 mg | ORAL_TABLET | Freq: Every evening | ORAL | Status: DC | PRN
  Administered 2014-03-27: 5 mg via ORAL
  Filled 2014-03-27: qty 1

## 2014-03-27 MED ORDER — ONDANSETRON HCL 4 MG PO TABS: 4.0000 mg | ORAL_TABLET | Freq: Four times a day (QID) | ORAL | Status: DC | PRN

## 2014-03-27 MED ORDER — ASPIRIN 81 MG PO CHEW
324.0000 mg | CHEWABLE_TABLET | Freq: Once | ORAL | Status: AC
  Administered 2014-03-27: 324 mg via ORAL
  Filled 2014-03-27: qty 4

## 2014-03-27 MED ORDER — ENOXAPARIN SODIUM 40 MG/0.4ML ~~LOC~~ SOLN
40.0000 mg | SUBCUTANEOUS | Status: DC
  Administered 2014-03-27: 40 mg via SUBCUTANEOUS
  Filled 2014-03-27: qty 0.4

## 2014-03-27 MED ORDER — TRAMADOL HCL 50 MG PO TABS
100.0000 mg | ORAL_TABLET | Freq: Four times a day (QID) | ORAL | Status: DC | PRN
  Administered 2014-03-27: 100 mg via ORAL
  Filled 2014-03-27: qty 2

## 2014-03-27 NOTE — ED Notes (Signed)
Pt c/o left sided chest pain x .  REports was sitting down watching TV and started having a squeezing sensation in left chest then vomited.  Reports SOB.  PT says had MI in March and this feels the same.  Pt took 2 baby aspirin and 3 nitro pta.    States nitro did not change her pain.

## 2014-03-27 NOTE — ED Provider Notes (Signed)
CSN: [id number]     Arrival date & time 03/27/14  1519 History   First MD Initiated Contact with Patient 03/27/14 1524     Chief Complaint  Patient presents with  . Chest Pain     The history is provided by the patient. No language interpreter was used.   HPI Comments: Olivia Wade is a 63 y.o. female who presents to the Emergency Department complaining of chest pain that started 20 minutes ago. She states she was sitting watching TV during onset. She describes her pain as spasms, twisting and burning and states it radiates to her back and shoulder. She reports associated SOB, nausea, and vomiting as associated symptoms. Patient reports she had stents placed in March, 2015. She denies abdominal pain as a symptom.   Cardiologist KELLY   Past Medical History  Diagnosis Date  . AAA (abdominal aortic aneurysm)   . Tobacco abuse   . HTN (hypertension)   . COPD (chronic obstructive pulmonary disease)   . Asthma   . HLD (hyperlipidemia)   . GERD (gastroesophageal reflux disease)   . Chronic low back pain     (Old vertebral fracture)  . Arthritis   . Coronary artery disease 09/08/13    with PCI  . NSTEMI (non-ST elevated myocardial infarction) 09/08/13  . Heart palpitations     prn toprol for rapid HR  . History of surgery on arm    Past Surgical History  Procedure Laterality Date  . Coronary stent placement  09/08/13    PCI with cutting balloon, PTCA,DES to prox LAD  . Coronary angioplasty with stent placement  09/10/13    PCI of RCA 90% proximal RCA stenosis with DES  . Laparoscopic nissen fundoplication      for severe reflux  . Cholecystectomy    . Abdominal hysterectomy    . 2d echo  12/2012    normal LV with mild LVH, grade 1 diastolic dysfunction  . Doppler echocardiography  12/31/2012  . Nuclear stress test  05/09/2011    NORMAL PATTREN OF PERFUSION IN ALL REGIONS. LV IS NORMAL IS SIZE. EF 68% NO WALL MOTION ABNORMALITIES.  Marland Kitchen. Abdominal aortic duplex  05/19/2012    WHEN  COMPARED TO PRIOR STUDY DATED 06/01/11 THERE IS AN INCREASE IN SIZE OF DILATATION.   Family History  Problem Relation Age of Onset  . Heart attack Father   . Heart attack Son [id number]  . Cancer Father     Esophageal  . Hypertension Mother    History  Substance Use Topics  . Smoking status: Former Smoker -- 0.50 packs/day for 37 years    Types: Cigarettes    Quit date: 09/08/2013  . Smokeless tobacco: Never Used  . Alcohol Use: No   OB History   Grav Para Term Preterm Abortions TAB SAB Ect Mult Living                 Review of Systems  Respiratory: Positive for shortness of breath.   Cardiovascular: Positive for chest pain.  Gastrointestinal: Positive for nausea and vomiting. Negative for abdominal pain.  All other systems reviewed and are negative.     Allergies  Bee venom; Penicillins; Hydrocodone; and Morphine and related  Home Medications   Prior to Admission medications   Medication Sig Start Date End Date Taking? Authorizing Provider  acetaminophen (TYLENOL) 325 MG tablet Take 325 mg by mouth every 6 (six) hours as needed for moderate pain.   Yes Historical Provider, MD  albuterol (PROVENTIL HFA;VENTOLIN HFA) 108 (90 BASE) MCG/ACT inhaler Inhale 1 puff into the lungs every 6 (six) hours as needed for wheezing or shortness of breath.   Yes Historical Provider, MD  albuterol (PROVENTIL) (2.5 MG/3ML) 0.083% nebulizer solution Take 2.5 mg by nebulization every 6 (six) hours as needed for wheezing or shortness of breath.   Yes Historical Provider, MD  ALPRAZolam Prudy Feeler) 0.5 MG tablet Take 0.5 mg by mouth at bedtime. *Patient is prescribed one tablets three times daily as needed for anxiety   Yes Historical Provider, MD  aspirin EC 81 MG tablet Take 81 mg by mouth daily.   Yes Historical Provider, MD  atorvastatin (LIPITOR) 40 MG tablet Take 1 tablet (40 mg total) by mouth daily. 01/07/14  Yes Lennette Bihari, MD  citalopram (CELEXA) 20 MG tablet Take 20 mg by mouth daily.   Yes  Historical Provider, MD  cyclobenzaprine (FLEXERIL) 10 MG tablet Take 1 tablet (10 mg total) by mouth 3 (three) times daily as needed for muscle spasms.   Yes Lennette Bihari, MD  isosorbide mononitrate (IMDUR) 30 MG 24 hr tablet Take 30 mg by mouth every evening.    Yes Historical Provider, MD  lisinopril (PRINIVIL,ZESTRIL) 5 MG tablet Take 1 tablet (5 mg total) by mouth daily. 09/14/13  Yes Brittainy Sherlynn Carbon, PA-C  metoprolol succinate (TOPROL-XL) 50 MG 24 hr tablet Take 1 tablet (50 mg total) by mouth 2 (two) times daily. Take with or immediately following a meal. 12/23/13  Yes Leone Brand, NP  montelukast (SINGULAIR) 10 MG tablet Take 10 mg by mouth at bedtime.   Yes Historical Provider, MD  nitroGLYCERIN (NITROSTAT) 0.4 MG SL tablet Place 1 tablet (0.4 mg total) under the tongue every 5 (five) minutes as needed for chest pain. 09/11/13  Yes Brittainy Sherlynn Carbon, PA-C  pantoprazole (PROTONIX) 40 MG tablet Take 40 mg by mouth every morning.   Yes Historical Provider, MD  Ticagrelor (BRILINTA) 90 MG TABS tablet Take 1 tablet (90 mg total) by mouth 2 (two) times daily. 09/11/13  Yes Brittainy Sherlynn Carbon, PA-C  traMADol (ULTRAM) 50 MG tablet Take 2 tablets (100 mg total) by mouth every 6 (six) hours as needed. 01/13/14  Yes Leone Brand, NP   BP 113/71  Pulse 67  Resp 23  SpO2 98% Physical Exam  Nursing note and vitals reviewed. Constitutional: She is oriented to person, place, and time. She appears well-developed and well-nourished.  HENT:  Head: Normocephalic and atraumatic.  Neck: Normal range of motion.  Cardiovascular: Normal rate.   Pulmonary/Chest: Effort normal and breath sounds normal. No respiratory distress. She has no wheezes. She has no rales.  Abdominal: She exhibits no distension.  Musculoskeletal: Normal range of motion.  Neurological: She is alert and oriented to person, place, and time.  Skin: Skin is warm and dry.  Psychiatric: She has a normal mood and affect. Her behavior  is normal.    ED Course  Procedures  DIAGNOSTIC STUDIES:   COORDINATION OF CARE: 3:32 PM Discussed treatment plan with pt at bedside and pt agreed to plan.   Labs Review Labs Reviewed  CBC WITH DIFFERENTIAL - Abnormal; Notable for the following:    RBC 3.58 (*)    Hemoglobin 11.4 (*)    HCT 33.5 (*)    Neutrophils Relative % 42 (*)    Lymphocytes Relative 48 (*)    All other components within normal limits  BASIC METABOLIC PANEL - Abnormal; Notable for the  following:    Potassium 3.5 (*)    GFR calc non Af Amer 65 (*)    GFR calc Af Amer 76 (*)    All other components within normal limits  TROPONIN I  PRO B NATRIURETIC PEPTIDE    Imaging Review Dg Chest Portable 1 View  03/27/2014   CLINICAL DATA:  Chest pain.  EXAM: PORTABLE CHEST - 1 VIEW  COMPARISON:  12/22/2013.  Chest CTA dated 10/19/2011.  FINDINGS: Interval decrease in depth of inspiration and interval mild enlargement of the cardiac silhouette with increased prominence of the pulmonary vasculature. No gross change in prominence of the interstitial markings. Diffuse osteopenia.  IMPRESSION: Interval mild cardiomegaly and pulmonary vascular congestion with stable chronic interstitial lung disease.   Electronically Signed   By: Gordan Payment M.D.   On: 03/27/2014 15:56     EKG Interpretation   Date/Time:  Saturday March 27 2014 15:27:59 EDT Ventricular Rate:  72 PR Interval:  143 QRS Duration: 96 QT Interval:  418 QTC Calculation: 457 R Axis:   48 Text Interpretation:  Sinus rhythm Low voltage, precordial leads Baseline  wander in lead(s) I III aVL V1 No significant change since last tracing  Confirmed by POLLINA  MD, CHRISTOPHER [phone]) on 03/27/2014 4:00:02 PM      MDM   Final diagnoses:  None   chest pain  Patient with known coronary artery disease presents to the ER for evaluation of pain in the left side of her chest radiating to his shoulder region. She has associated shortness of breath, nausea  and vomiting. She reports that this feels similar to what she has had cardiac chest pain. Patient has had improvement here in the ER after aspirin and nitroglycerin. EKG is unchanged from previous. First troponin is negative. Based on the patient's significant past history, will require observation for cardiac rule out.  I personally performed the services described in this documentation, which was scribed in my presence. The recorded information has been reviewed and is accurate.       Gilda Crease, MD 03/27/14 [phone]

## 2014-03-27 NOTE — ED Notes (Signed)
Report given to [facility], on Dept 300. All questions answered.

## 2014-03-27 NOTE — H&P (Signed)
History and Physical  Olivia Wade XNA:[id number] DOB: 20-Feb-1951 DOA: 03/27/2014  Referring physician: Dr Blinda Leatherwood, ED physician PCP: Ernestine Conrad, MD   Chief Complaint: Chest pain  HPI: Olivia Wade is a 63 y.o. female  With a history of hypertension, COPD, hyperlipidemia, coronary artery disease, GERD, chronic low back pain, and a STEMI in 09/08/2013. She underwent PCI at that time with stenting of her LAD. 2 days later she had a second PCI with stenting of the RCA due to a 90% stenosis. She is followed by cardiology. She began to have chest pain today at 1 PM that was similar to the pain when she had her stemming. The pain was located in her left substernal area and is described as a pressure and burning. Pain radiated to her back and into her left shoulder. She had nausea and vomited. Additionally, she has mild diaphoresis and shortness of breath. She called EMS and presented to the hospital for evaluation.   Review of Systems:   Pt complains of mild headache, intermittent diarrhea, abdominal pain, palpitations occasionally.  Pt denies any fevers, chills, orthopnea, dyspnea on exertion, wheezing, cough, pleurisy.  Review of systems are otherwise negative  Past Medical History  Diagnosis Date  . AAA (abdominal aortic aneurysm)   . Tobacco abuse   . HTN (hypertension)   . COPD (chronic obstructive pulmonary disease)   . Asthma   . HLD (hyperlipidemia)   . GERD (gastroesophageal reflux disease)   . Chronic low back pain     (Old vertebral fracture)  . Arthritis   . Coronary artery disease 09/08/13    with PCI  . NSTEMI (non-ST elevated myocardial infarction) 09/08/13  . Heart palpitations     prn toprol for rapid HR  . History of surgery on arm    Past Surgical History  Procedure Laterality Date  . Coronary stent placement  09/08/13    PCI with cutting balloon, PTCA,DES to prox LAD  . Coronary angioplasty with stent placement  09/10/13    PCI of RCA 90% proximal RCA stenosis  with DES  . Laparoscopic nissen fundoplication      for severe reflux  . Cholecystectomy    . Abdominal hysterectomy    . 2d echo  12/2012    normal LV with mild LVH, grade 1 diastolic dysfunction  . Doppler echocardiography  12/31/2012  . Nuclear stress test  05/09/2011    NORMAL PATTREN OF PERFUSION IN ALL REGIONS. LV IS NORMAL IS SIZE. EF 68% NO WALL MOTION ABNORMALITIES.  Marland Kitchen Abdominal aortic duplex  05/19/2012    WHEN COMPARED TO PRIOR STUDY DATED 06/01/11 THERE IS AN INCREASE IN SIZE OF DILATATION.   Social History:  reports that she quit smoking about 6 months ago. Her smoking use included Cigarettes. She has a 18.5 pack-year smoking history. She has never used smokeless tobacco. She reports that she does not drink alcohol or use illicit drugs. Patient lives at home & is able to participate in activities of daily living without assistance  Allergies  Allergen Reactions  . Bee Venom Anaphylaxis, Hives and Swelling  . Penicillins Other (See Comments)    PATIENT REPORTS "MOTHER TOLD HER SHE HAD ALLERGY TO PCN"  . Hydrocodone Other (See Comments)  . Morphine And Related Rash    Family History  Problem Relation Age of Onset  . Heart attack Father   . Heart attack Son 70  . Cancer Father     Esophageal  . Hypertension Mother  Prior to Admission medications   Medication Sig Start Date End Date Taking? Authorizing Provider  acetaminophen (TYLENOL) 325 MG tablet Take 325 mg by mouth every 6 (six) hours as needed for moderate pain.   Yes Historical Provider, MD  albuterol (PROVENTIL HFA;VENTOLIN HFA) 108 (90 BASE) MCG/ACT inhaler Inhale 1 puff into the lungs every 6 (six) hours as needed for wheezing or shortness of breath.   Yes Historical Provider, MD  albuterol (PROVENTIL) (2.5 MG/3ML) 0.083% nebulizer solution Take 2.5 mg by nebulization every 6 (six) hours as needed for wheezing or shortness of breath.   Yes Historical Provider, MD  ALPRAZolam Prudy Feeler) 0.5 MG tablet Take  0.5 mg by mouth at bedtime. *Patient is prescribed one tablets three times daily as needed for anxiety   Yes Historical Provider, MD  aspirin EC 81 MG tablet Take 81 mg by mouth daily.   Yes Historical Provider, MD  atorvastatin (LIPITOR) 40 MG tablet Take 1 tablet (40 mg total) by mouth daily. 01/07/14  Yes Lennette Bihari, MD  citalopram (CELEXA) 20 MG tablet Take 20 mg by mouth daily.   Yes Historical Provider, MD  cyclobenzaprine (FLEXERIL) 10 MG tablet Take 1 tablet (10 mg total) by mouth 3 (three) times daily as needed for muscle spasms.   Yes Lennette Bihari, MD  isosorbide mononitrate (IMDUR) 30 MG 24 hr tablet Take 30 mg by mouth every evening.    Yes Historical Provider, MD  lisinopril (PRINIVIL,ZESTRIL) 5 MG tablet Take 1 tablet (5 mg total) by mouth daily. 09/14/13  Yes Brittainy Sherlynn Carbon, PA-C  metoprolol succinate (TOPROL-XL) 50 MG 24 hr tablet Take 1 tablet (50 mg total) by mouth 2 (two) times daily. Take with or immediately following a meal. 12/23/13  Yes Leone Brand, NP  montelukast (SINGULAIR) 10 MG tablet Take 10 mg by mouth at bedtime.   Yes Historical Provider, MD  nitroGLYCERIN (NITROSTAT) 0.4 MG SL tablet Place 1 tablet (0.4 mg total) under the tongue every 5 (five) minutes as needed for chest pain. 09/11/13  Yes Brittainy Sherlynn Carbon, PA-C  pantoprazole (PROTONIX) 40 MG tablet Take 40 mg by mouth every morning.   Yes Historical Provider, MD  Ticagrelor (BRILINTA) 90 MG TABS tablet Take 1 tablet (90 mg total) by mouth 2 (two) times daily. 09/11/13  Yes Brittainy Sherlynn Carbon, PA-C  traMADol (ULTRAM) 50 MG tablet Take 2 tablets (100 mg total) by mouth every 6 (six) hours as needed. 01/13/14  Yes Leone Brand, NP    Physical Exam: BP 113/71  Pulse 67  Resp 23  SpO2 98%  General: Elderly Caucasian female. Awake and alert and oriented x3. No acute cardiopulmonary distress.  Eyes: Pupils equal, round, reactive to light. Extraocular muscles are intact. Sclerae anicteric and noninjected.    ENT: External auditory canals are patent and tympanic membranes reflect a good cone of light. Moist mucosal membranes. No mucosal lesions. Teeth in poor repair  Neck: Neck supple without lymphadenopathy. No carotid bruits. No masses palpated.  Cardiovascular: Regular rate with normal S1-S2 sounds. No murmurs, rubs, gallops auscultated. No JVD.  Respiratory: Good respiratory effort with no wheezes, rales, rhonchi. Lungs clear to auscultation bilaterally.  Abdomen: Soft, mildly tender diffusely, but nondistended. Active bowel sounds. No masses or hepatosplenomegaly  Skin: Dry, warm to touch. 2+ dorsalis pedis and radial pulses. Musculoskeletal: No calf or leg pain. All major joints not erythematous nontender.  Psychiatric: Intact judgment and insight.  Neurologic: No focal neurological deficits. Cranial nerves II  through XII are grossly intact.           Labs on Admission:  Basic Metabolic Panel:  Recent Labs Lab 03/27/14 1528  NA 138  K 3.5*  CL 104  CO2 21  GLUCOSE 80  BUN 14  CREATININE 0.92  CALCIUM 8.7   Liver Function Tests: No results found for this basename: AST, ALT, ALKPHOS, BILITOT, PROT, ALBUMIN,  in the last 168 hours No results found for this basename: LIPASE, AMYLASE,  in the last 168 hours No results found for this basename: AMMONIA,  in the last 168 hours CBC:  Recent Labs Lab 03/27/14 1528  WBC 6.7  NEUTROABS 2.8  HGB 11.4*  HCT 33.5*  MCV 93.6  PLT 262   Cardiac Enzymes:  Recent Labs Lab 03/27/14 1528  TROPONINI <0.30    BNP (last 3 results)  Recent Labs  09/08/13 1055 03/27/14 1528  PROBNP 61.8 69.9   CBG: No results found for this basename: GLUCAP,  in the last 168 hours  Radiological Exams on Admission: Dg Chest Portable 1 View  03/27/2014   CLINICAL DATA:  Chest pain.  EXAM: PORTABLE CHEST - 1 VIEW  COMPARISON:  12/22/2013.  Chest CTA dated 10/19/2011.  FINDINGS: Interval decrease in depth of inspiration and interval mild  enlargement of the cardiac silhouette with increased prominence of the pulmonary vasculature. No gross change in prominence of the interstitial markings. Diffuse osteopenia.  IMPRESSION: Interval mild cardiomegaly and pulmonary vascular congestion with stable chronic interstitial lung disease.   Electronically Signed   By: Gordan PaymentSteve  Reid M.D.   On: 03/27/2014 15:56    EKG: Independently reviewed. Normal sinus rhythm. No ST changes  Assessment/Plan Present on Admission:  . Chest pain  #1 chest pain Admit for observation and rule out MI. We'll run serial troponins and have the patient on continuous telemetry. We'll provide pain relief with Dilaudid. Continue nitroglycerin when necessary for chest pain. As the patient recently had a stress test approximately 8 weeks ago, I do not feel that she would need a repeat stress test should her troponins rule out. We'll continue aspirin, platelet aggregate inhibitor, beta blocker, lisinopril.  #2 coronary artery disease Continue home medications  #3 GERD Continue home medications  #4 hypertension Continue home medications  DVT prophylaxis: Lovenox  Consultants: None  Code Status: Full  Family Communication: Husband in the room   Disposition Plan: Home pending rule out  Time spent: 50 minutes  Candelaria CelesteJacob Hilaria Titsworth, [place] [facility] Pager [phone]  **Disclaimer: This note may have been dictated with voice recognition software. Similar sounding words can inadvertently be transcribed and this note may contain transcription errors which may not have been corrected upon publication of note.**

## 2014-03-28 DIAGNOSIS — I2510 Atherosclerotic heart disease of native coronary artery without angina pectoris: Secondary | ICD-10-CM

## 2014-03-28 DIAGNOSIS — R072 Precordial pain: Secondary | ICD-10-CM

## 2014-03-28 LAB — CBC
HEMATOCRIT: 32 % — AB (ref 36.0–46.0)
HEMOGLOBIN: 10.8 g/dL — AB (ref 12.0–15.0)
MCH: 31.8 pg (ref 26.0–34.0)
MCHC: 33.8 g/dL (ref 30.0–36.0)
MCV: 94.1 fL (ref 78.0–100.0)
Platelets: 229 10*3/uL (ref 150–400)
RBC: 3.4 MIL/uL — ABNORMAL LOW (ref 3.87–5.11)
RDW: 13.5 % (ref 11.5–15.5)
WBC: 5.8 10*3/uL (ref 4.0–10.5)

## 2014-03-28 LAB — LIPID PANEL
Cholesterol: 128 mg/dL (ref 0–200)
HDL: 30 mg/dL — ABNORMAL LOW (ref >39)
LDL CALC: 31 mg/dL (ref 0–99)
Total CHOL/HDL Ratio: 4.3 RATIO
Triglycerides: 337 mg/dL — ABNORMAL HIGH (ref <150)
VLDL: 67 mg/dL — AB (ref 0–40)

## 2014-03-28 LAB — BASIC METABOLIC PANEL
ANION GAP: 11 (ref 5–15)
BUN: 12 mg/dL (ref 6–23)
CALCIUM: 8.6 mg/dL (ref 8.4–10.5)
CHLORIDE: 106 meq/L (ref 96–112)
CO2: 22 meq/L (ref 19–32)
Creatinine, Ser: 0.87 mg/dL (ref 0.50–1.10)
GFR calc Af Amer: 81 mL/min — ABNORMAL LOW (ref >90)
GFR calc non Af Amer: 70 mL/min — ABNORMAL LOW (ref >90)
GLUCOSE: 98 mg/dL (ref 70–99)
Potassium: 3.9 mEq/L (ref 3.7–5.3)
Sodium: 139 mEq/L (ref 137–147)

## 2014-03-28 LAB — TROPONIN I: Troponin I: <0.3 ng/mL (ref <0.30)

## 2014-03-28 NOTE — Progress Notes (Signed)
Patient with orders to be discharged home. Discharge instructions given, patient verbalized understanding. Patient stable. Patient left in private vehicle with family.  

## 2014-03-28 NOTE — Progress Notes (Signed)
Patient blood pressure running low 95/57. Held blood pressure medications. Dr. Irene Limbo notified.

## 2014-03-28 NOTE — Progress Notes (Signed)
UR completed 

## 2014-03-28 NOTE — Discharge Summary (Addendum)
Physician Discharge Summary  Olivia Wade FBP:[id number] DOB: 10/01/1950 DOA: 03/27/2014  PCP: Ernestine Conrad, MD  Admit date: 03/27/2014 Discharge date: 03/28/2014  Recommendations for Outpatient Follow-up:  1. Atypical chest pain, sent message to B Hager with [facility] as FYI of admission only 2. Normocytic anemia   Follow-up Information   Follow up with Ernestine Conrad, MD. Schedule an appointment as soon as possible for a visit in 2 weeks.   Specialty:  Family Medicine   Contact information:   [address] [place] [address] [phone]      Discharge Diagnoses:  1. Atypical chest pain 2. CAD with h/o MI, stent placment 3. GERD with h/o Nissen fundoplication  4. Mild normocytic anemia, stable  Discharge Condition: improved Disposition: home   Diet recommendation: heart healthy  Filed Weights   03/27/14 1816  Weight: 60.782 kg (134 lb)    History of present illness:  63yow with h/o MI, stent placement 08/2013 presented to ED with h/o chest pain and vomiting. Admitted for chest pain evaluation.  Hospital Course:  Observed overnight, no recurrent chest pain. No SOB. No complaints. Troponins negative, telemetry unremarkable, no evidence of ACS. Home today. Continue aspirin, Lipitor, Imdur, lisinopril, Toprol-XL, Brilinta  1. Atypical chest pain. Only lasted about 10 min per patient. Likely stress secondary to argument with brother. Also consider GERD. Has ruled out for ACS. Low risk nuclear study 01/2014, no reversible ischemia. No further evaluation suggested. 2. CAD with MI and stent placement 08/2013, stable. 3. GERD, h/o Nissen fundoplication for severe reflux, continue PPI.  Consultants:  none Procedures:  none  Discharge Instructions  Discharge Instructions   Activity as tolerated - No restrictions    Complete by:  As directed      Diet - low sodium heart healthy    Complete by:  As directed      Discharge instructions    Complete by:  As directed    Call your physician or seek immediate medical attention for recurrent chest pain, vomiting, shortness of breath or worsening of condition.          Current Discharge Medication List    CONTINUE these medications which have NOT CHANGED   Details  acetaminophen (TYLENOL) 325 MG tablet Take 325 mg by mouth every 6 (six) hours as needed for moderate pain.    albuterol (PROVENTIL HFA;VENTOLIN HFA) 108 (90 BASE) MCG/ACT inhaler Inhale 1 puff into the lungs every 6 (six) hours as needed for wheezing or shortness of breath.    albuterol (PROVENTIL) (2.5 MG/3ML) 0.083% nebulizer solution Take 2.5 mg by nebulization every 6 (six) hours as needed for wheezing or shortness of breath.    ALPRAZolam (XANAX) 0.5 MG tablet Take 0.5 mg by mouth at bedtime. *Patient is prescribed one tablets three times daily as needed for anxiety    aspirin EC 81 MG tablet Take 81 mg by mouth daily.    atorvastatin (LIPITOR) 40 MG tablet Take 1 tablet (40 mg total) by mouth daily. Qty: 30 tablet, Refills: 9    citalopram (CELEXA) 20 MG tablet Take 20 mg by mouth daily.    cyclobenzaprine (FLEXERIL) 10 MG tablet Take 1 tablet (10 mg total) by mouth 3 (three) times daily as needed for muscle spasms. Qty: 30 tablet, Refills: 6    isosorbide mononitrate (IMDUR) 30 MG 24 hr tablet Take 30 mg by mouth every evening.     lisinopril (PRINIVIL,ZESTRIL) 5 MG tablet Take 1 tablet (5 mg total)  by mouth daily. Qty: 30 tablet, Refills: 9   Associated Diagnoses: HTN (hypertension)    metoprolol succinate (TOPROL-XL) 50 MG 24 hr tablet Take 1 tablet (50 mg total) by mouth 2 (two) times daily. Take with or immediately following a meal. Qty: 60 tablet, Refills: 6    montelukast (SINGULAIR) 10 MG tablet Take 10 mg by mouth at bedtime.    nitroGLYCERIN (NITROSTAT) 0.4 MG SL tablet Place 1 tablet (0.4 mg total) under the tongue every 5 (five) minutes as needed for chest pain. Qty: 25 tablet, Refills: 2    pantoprazole  (PROTONIX) 40 MG tablet Take 40 mg by mouth every morning.    Ticagrelor (BRILINTA) 90 MG TABS tablet Take 1 tablet (90 mg total) by mouth 2 (two) times daily. Qty: 60 tablet, Refills: 10    traMADol (ULTRAM) 50 MG tablet Take 2 tablets (100 mg total) by mouth every 6 (six) hours as needed. Qty: 30 tablet, Refills: 0       Allergies  Allergen Reactions  . Bee Venom Anaphylaxis, Hives and Swelling  . Penicillins Other (See Comments)    PATIENT REPORTS "MOTHER TOLD HER SHE HAD ALLERGY TO PCN"  . Hydrocodone Other (See Comments)  . Morphine And Related Rash    The results of significant diagnostics from this hospitalization (including imaging, microbiology, ancillary and laboratory) are listed below for reference.    Significant Diagnostic Studies: Dg Chest Portable 1 View  03/27/2014   CLINICAL DATA:  Chest pain.  EXAM: PORTABLE CHEST - 1 VIEW  COMPARISON:  12/22/2013.  Chest CTA dated 10/19/2011.  FINDINGS: Interval decrease in depth of inspiration and interval mild enlargement of the cardiac silhouette with increased prominence of the pulmonary vasculature. No gross change in prominence of the interstitial markings. Diffuse osteopenia.  IMPRESSION: Interval mild cardiomegaly and pulmonary vascular congestion with stable chronic interstitial lung disease.   Electronically Signed   By: Gordan PaymentSteve  Reid M.D.   On: 03/27/2014 15:56   Labs: Basic Metabolic Panel:  Recent Labs Lab 03/27/14 1528 03/28/14 0344  NA 138 139  K 3.5* 3.9  CL 104 106  CO2 21 22  GLUCOSE 80 98  BUN 14 12  CREATININE 0.92 0.87  CALCIUM 8.7 8.6   CBC:  Recent Labs Lab 03/27/14 1528 03/28/14 0344  WBC 6.7 5.8  NEUTROABS 2.8  --   HGB 11.4* 10.8*  HCT 33.5* 32.0*  MCV 93.6 94.1  PLT 262 229   Cardiac Enzymes:  Recent Labs Lab 03/27/14 1528 03/27/14 2130 03/28/14 0344 03/28/14 0912  TROPONINI <0.30 <0.30 <0.30 <0.30     Recent Labs  09/08/13 1055 03/27/14 1528  PROBNP 61.8 69.9     Active Problems:   Chest pain   Time coordinating discharge: 20 minutes  Signed:  Brendia Sacksaniel Adreonna Yontz, MD [facility] 03/28/2014, 12:04 PM

## 2014-03-28 NOTE — Progress Notes (Signed)
  PROGRESS NOTE  Olivia Wade YQI:[id number] DOB: 24-Apr-1951 DOA: 03/27/2014 PCP: Ernestine Conrad, MD  Summary: 63yow with h/o MI, stent placement 08/2013 presented to ED with h/o chest pain and vomiting. Admitted for chest pain evaluation.  Assessment/Plan: 1. Atypical chest pain. Only lasted about 10 min per patient. Likely stress secondary to argument with brother. Also consider GERD. Has ruled out for ACS. Low risk nuclear study 01/2014, no reversible ischemia. No further evaluation suggested. 2. CAD with MI and stent placement 08/2013, stable. 3. GERD, h/o Nissen fundoplication for severe reflux, continue PPI.   Home today. Continue aspirin, Lipitor, Imdur, lisinopril, Toprol-XL, Brilinta  Brendia Sacks, MD  [facility]  Pager [phone] If 7PM-7AM, please contact night-coverage at [email or web address], password [facility] 03/28/2014, 11:35 AM  LOS: 1 day   Consultants:  none  Procedures:  none  Antibiotics:    HPI/Subjective: Feeling well, no CP or SOB. No complaints. Wants to go home. Thinks CP yesterday was stress secondary to argument with brother.  Objective: Filed Vitals:   03/27/14 2200 03/27/14 2227 03/28/14 0229 03/28/14 0921  BP: 112/58 105/60 79/45 95/57   Pulse: 73 66 77   Temp: 97.6 F (36.4 C) 97.6 F (36.4 C) 97.4 F (36.3 C)   TempSrc: Oral Oral Oral   Resp: 18 18 18    Height:      Weight:      SpO2: 98% 99% 99%     Intake/Output Summary (Last 24 hours) at 03/28/14 1135 Last data filed at 03/27/14 1904  Gross per 24 hour  Intake    240 ml  Output      0 ml  Net    240 ml     Filed Weights   03/27/14 1816  Weight: 60.782 kg (134 lb)    Exam:     Afebrile, VSS, one recording of low BP  General: appears calm and comfortable  Psych: alert, speech fluent and clear  CV: RRR no m/r/g. Telemetry SR  Respiratory: CTA bilaterally no w/r/r. Normal respiratory effort  Data Reviewed:  troponins negative  BMP unremarkable  Triglycerides  337  Hgb stable 10.8  EKG SR no acute changes  Scheduled Meds: . ALPRAZolam  0.5 mg Oral QHS  . aspirin EC  81 mg Oral Daily  . atorvastatin  40 mg Oral Daily  . citalopram  20 mg Oral Daily  . enoxaparin (LOVENOX) injection  40 mg Subcutaneous Q24H  . isosorbide mononitrate  30 mg Oral QPM  . lisinopril  5 mg Oral Daily  . metoprolol succinate  50 mg Oral BID  . montelukast  10 mg Oral QHS  . pantoprazole  40 mg Oral q morning - 10a  . sodium chloride  3 mL Intravenous Q12H  . ticagrelor  90 mg Oral BID   Continuous Infusions:   Active Problems:   Chest pain

## 2014-03-29 NOTE — Telephone Encounter (Signed)
Rx was sent to pharmacy electronically. 

## 2014-05-04 ENCOUNTER — Encounter: Payer: Self-pay | Admitting: Cardiovascular Disease

## 2014-05-04 ENCOUNTER — Ambulatory Visit (INDEPENDENT_AMBULATORY_CARE_PROVIDER_SITE_OTHER): Payer: BC Managed Care – PPO | Admitting: Cardiovascular Disease

## 2014-05-04 VITALS — BP 118/80 | HR 61 | Ht 64.0 in | Wt 147.8 lb

## 2014-05-04 DIAGNOSIS — I714 Abdominal aortic aneurysm, without rupture, unspecified: Secondary | ICD-10-CM

## 2014-05-04 DIAGNOSIS — I10 Essential (primary) hypertension: Secondary | ICD-10-CM

## 2014-05-04 DIAGNOSIS — J4489 Other specified chronic obstructive pulmonary disease: Secondary | ICD-10-CM

## 2014-05-04 DIAGNOSIS — J449 Chronic obstructive pulmonary disease, unspecified: Secondary | ICD-10-CM

## 2014-05-04 DIAGNOSIS — I2510 Atherosclerotic heart disease of native coronary artery without angina pectoris: Secondary | ICD-10-CM

## 2014-05-04 DIAGNOSIS — K219 Gastro-esophageal reflux disease without esophagitis: Secondary | ICD-10-CM

## 2014-05-04 DIAGNOSIS — E785 Hyperlipidemia, unspecified: Secondary | ICD-10-CM

## 2014-05-04 NOTE — Progress Notes (Signed)
Patient ID: Olivia Wade, female   DOB: [birth year], 63 y.o.   MRN: [id number]     HPI: Olivia Wade is a 63 year old female establish cardiology care with me in December 2014.  I last saw her in April in follow-up of her cutaneous coronary interventions following her presentation with acute coronary syndrome.  She now presents for six-month follow-up evaluation.   Olivia Wade had a long-standing history of significant tobacco use and started smoking in 1977 and smoked up to 3 packs per day for many years. She has a history of chronic low back pain and has been evaluated by Dr. Gerlene Fee. She has also a documented asymptomatic abdominal aortic aneurysm and on last testing one year ago this measured 3.3x3.3 cm. Additional problems include COPD/asthma. She is status post laparoscopic Nissen fundoplication for severe reflux is also status post cholecystectomy and hysterectomy remotely. In July 2014 she did have an echo Doppler study which showed mild LVH with normal systolic function. She did have grade 1 diastolic dysfunction. There was evidence for mild mitral regurgitation. A nuclear perfusion study several years ago which showed normal perfusion without scar or ischemia in 2012.  Olivia Wade developed severe chest pain on 09/08/2013, which time her initial ECG showed hyperacute anterior T waves, which did improve with nitroglycerin and heparin.  She continued to experience chest pain was taken to the cardiac catheterization laboratory and was found to have subtotal proximal LAD stenosis associated with moderate mid distal anterolateral hypocontractility.  She also had a 90% Napkin ring like stenosis in her proximal RCA.  On 09/08/2013 she underwent successful PCI with cutting balloon, PTCA, and DES stenting of her proximal LAD with insertion of a 3.0x23 mm size helpline stent, postdilated to 3.25 mm.  2 days later, she underwent successful intervention to the 90% proximal RCA stenosis and had a 3.0x15 mm on its  helpline stent, postdilated to 3.25 mm.  Subsequently, she has felt significantly improved, and denies any recurrent chest pain, development.  He quit smoking the day of her acute coronary syndrome.  When I had seen her in follow-up.  She was doing well without recurrent anginal type symptoms.  She does have chronic shortness of breath.  She was seen in August 2015 by Huey Bienenstock, PA-C.  She was hospitalized overnight in October 17 through 03/28/2014 at [facility] with atypical chest pain.  She has a history of GERD with history of Nissen fundoplication.  Cardiac enzymes were negative.  Since hospital discharge, she denies any episodes of chest pressure or chest burning.  She is unaware of palpitations.  She is not very active.  She presents for evaluation  Allergies  Allergen Reactions  . Bee Venom Anaphylaxis, Hives and Swelling  . Penicillins Other (See Comments)    PATIENT REPORTS "MOTHER TOLD HER SHE HAD ALLERGY TO PCN"  . Hydrocodone Other (See Comments)  . Morphine And Related Rash    Current Outpatient Prescriptions  Medication Sig Dispense Refill  . acetaminophen (TYLENOL) 325 MG tablet Take 325 mg by mouth every 6 (six) hours as needed for moderate pain.    Marland Kitchen albuterol (PROVENTIL HFA;VENTOLIN HFA) 108 (90 BASE) MCG/ACT inhaler Inhale 1 puff into the lungs every 6 (six) hours as needed for wheezing or shortness of breath.    Marland Kitchen albuterol (PROVENTIL) (2.5 MG/3ML) 0.083% nebulizer solution Take 2.5 mg by nebulization every 6 (six) hours as needed for wheezing or shortness of breath.    . ALPRAZolam (XANAX) 0.5 MG tablet Take  0.5 mg by mouth at bedtime. *Patient is prescribed one tablets three times daily as needed for anxiety    . aspirin EC 81 MG tablet Take 81 mg by mouth daily.    Marland Kitchen atorvastatin (LIPITOR) 40 MG tablet Take 1 tablet (40 mg total) by mouth daily. 30 tablet 9  . citalopram (CELEXA) 20 MG tablet Take 20 mg by mouth daily.    . cyclobenzaprine (FLEXERIL) 10 MG tablet  Take 1 tablet (10 mg total) by mouth 3 (three) times daily as needed for muscle spasms. 30 tablet 6  . isosorbide mononitrate (IMDUR) 30 MG 24 hr tablet Take 30 mg by mouth every evening.     Marland Kitchen lisinopril (PRINIVIL,ZESTRIL) 5 MG tablet Take 1 tablet (5 mg total) by mouth daily. 30 tablet 9  . metoprolol succinate (TOPROL-XL) 50 MG 24 hr tablet Take 1 tablet (50 mg total) by mouth 2 (two) times daily. Take with or immediately following a meal. 60 tablet 6  . montelukast (SINGULAIR) 10 MG tablet Take 10 mg by mouth at bedtime.    Marland Kitchen NITROSTAT 0.4 MG SL tablet place 1 tablet under the tongue every 5 minutes for UP TO 3 doses if needed for angina as directed by prescriber 25 tablet 2  . pantoprazole (PROTONIX) 40 MG tablet Take 40 mg by mouth every morning.    . Ticagrelor (BRILINTA) 90 MG TABS tablet Take 1 tablet (90 mg total) by mouth 2 (two) times daily. 60 tablet 10  . traMADol (ULTRAM) 50 MG tablet Take 2 tablets (100 mg total) by mouth every 6 (six) hours as needed. 30 tablet 0   No current facility-administered medications for this visit.   Social history is notable in that she is married, has 3 children, 11 grandchildren, and recently has a great-grandchild. She continues to smoke cigarettes and has been smoking since 1977. She does not exercise. There is no alcohol use.  Family History  Problem Relation Age of Onset  . Heart attack Father   . Heart attack Son 31  . Cancer Father     Esophageal  . Hypertension Mother    ROS General: Negative; No fevers, chills, or night sweats;  HEENT: Negative; No changes in vision or hearing, sinus congestion, difficulty swallowing Pulmonary: Negative; No cough, wheezing, shortness of breath, hemoptysis Cardiovascular: Negative; No chest pain, presyncope, syncope, palpitations History of abdominal aortic aneurysm GI: History of GERD, status post fundoplication; No nausea, vomiting, diarrhea, or abdominal pain GU: Negative; No dysuria, hematuria, or  difficulty voiding Musculoskeletal: Negative; no myalgias, joint pain, or weakness Hematologic/Oncology: Negative; no easy bruising, bleeding Endocrine: Negative; no heat/cold intolerance; no diabetes Neuro: Negative; no changes in balance, headaches Skin: Negative; No rashes or skin lesions Psychiatric: History of anxiety and depression Sleep: Negative; No snoring, daytime sleepiness, hypersomnolence, bruxism, restless legs, hypnogognic hallucinations, no cataplexy Other comprehensive 14 point system review is negative.   PE BP 118/80 mmHg  Pulse 61  Ht 5\' 4"  (1.626 m)  Wt 147 lb 12.8 oz (67.042 kg)  BMI 25.36 kg/m2 General: Alert, oriented, no distress. She appears significantly older than her stated age. Skin: normal turgor, no rashes HEENT: Normocephalic, atraumatic. Pupils round and reactive; sclera anicteric; Fundi mild arteriolar narrowing. No hemorrhages or exudates Nose without nasal septal hypertrophy Mouth/Parynx benign; Mallinpatti scale 3 Neck: No JVD, no carotid bruits, normal carotid upstroke Lungs: Diffusely decreased breath sounds; no wheezing or rales Chest wall: without tenderness to palpitation Heart: RRR, s1 s2 normal 1/6 systolic murmur.  No S3 gallop.  No diastolic murmur.  No rubs, thrills or heaves. Abdomen: soft, nontender; no hepatosplenomehaly, BS+; abdominal aorta nontender and not dilated by palpation. Back: no CVA tenderness Pulses 2+ Extremities: no clubbing cyanosis or edema, Homan's sign negative  Neurologic: grossly nonfocal; Cranial nerves grossly wnl Psychologic: Normal mood and affect  ECG (independently read by me): Normal sinus rhythm at 61 bpm.  Nonspecific ST changes.  April 2015 ECG (independently read by me): Normal sinus rhythm at 73 beats per minute with preserved R waves but diffuse T-wave inversion V2 through V6, as well as in leads 1, 2 3-1/2.  Prior 05/26/2013 ECG: Sinus rhythm 82 beats per minute. QTc interval 450 Olivia. No  significant ST-T changes  LABS:  BMET    Component Value Date/Time   NA 139 03/28/2014 0344   K 3.9 03/28/2014 0344   CL 106 03/28/2014 0344   CO2 22 03/28/2014 0344   GLUCOSE 98 03/28/2014 0344   BUN 12 03/28/2014 0344   CREATININE 0.87 03/28/2014 0344   CREATININE 0.98 10/17/2013 0858   CALCIUM 8.6 03/28/2014 0344   GFRNONAA 70* 03/28/2014 0344   GFRAA 81* 03/28/2014 0344     Hepatic Function Panel     Component Value Date/Time   PROT 7.4 12/23/2013 0420   ALBUMIN 3.7 12/23/2013 0420   AST 20 12/23/2013 0420   ALT 14 12/23/2013 0420   ALKPHOS 96 12/23/2013 0420   BILITOT 0.4 12/23/2013 0420   BILIDIR <0.2 12/23/2013 0420   IBILI NOT CALCULATED 12/23/2013 0420     CBC    Component Value Date/Time   WBC 5.8 03/28/2014 0344   RBC 3.40* 03/28/2014 0344   HGB 10.8* 03/28/2014 0344   HCT 32.0* 03/28/2014 0344   PLT 229 03/28/2014 0344   MCV 94.1 03/28/2014 0344   MCH 31.8 03/28/2014 0344   MCHC 33.8 03/28/2014 0344   RDW 13.5 03/28/2014 0344   LYMPHSABS 3.2 03/27/2014 1528   MONOABS 0.6 03/27/2014 1528   EOSABS 0.1 03/27/2014 1528   BASOSABS 0.0 03/27/2014 1528     BNP    Component Value Date/Time   PROBNP 69.9 03/27/2014 1528    Lipid Panel     Component Value Date/Time   CHOL 128 03/28/2014 0344     RADIOLOGY: No results found.   ASSESSMENT AND PLAN:  Olivia. Olivia Wade is a 63 year old female who has a history of COPD/asthma and had smoked for over 37 years prior to quitting in March 2015.  She presented with an acute coronary syndrome/non-ST segment elevation MI and initially had hyperacute ST elevation, which did improve with nitroglycerin and heparin.  She was taken to the catheterization laboratory and was found to have subtotal proximal LAD stenosis, which was successfully stented to cover as well the diffuse 50% stenosis proximal to the high-grade lesion.  She subsequently underwent staged intervention to her high-grade RCA stenosis.  2 days later.   Currently she is on dual antiplatelet therapy with aspirin and Brilinta 90 mg twice a day.  I do not feel that her overall shortness of breath is related to the ticagrelor since she does have significant underlying COPD and emphysema which may be the more prominent factor.  She's not having anginal symptoms.  She is on lisinopril 5 mg and Toprol 50 g twice a day.  Her blood pressure today on this therapy is stable at 118/80 with a normal pulse at 61.  She continues to be on atorvastatin 40 mg for hyperlipidemia.  She's not having active wheezing.  She's not having any anginal symptomatology.  She recently had developed somewhat atypical chest pain most likely musculoskeletal in etiology.  She continues to take Protonix for GERD and she status post fundoplication.  She has a documented abdominal aortic aneurysm which had been stable on her last Doppler study of December 2014.  I will not recheck this this December, but we'll check it in May 2016 for 18 month follow-up evaluation.  I'll see her in the office in follow-up and further conditions were made at that time.   Lennette Bihari, MD, [facility] 05/04/2014 5:42 PM

## 2014-05-04 NOTE — Patient Instructions (Signed)
Your physician has requested that you have an abdominal aorta duplex. During this test, an ultrasound is used to evaluate the aorta. Allow 30 minutes for this exam. Do not eat after midnight the day before and avoid carbonated beverages THIS WILL BE DONE IN MAY 2016. With a follow up appointment also in May.

## 2014-05-05 ENCOUNTER — Other Ambulatory Visit: Payer: Self-pay | Admitting: Cardiovascular Disease

## 2014-05-05 DIAGNOSIS — Z9889 Other specified postprocedural states: Secondary | ICD-10-CM

## 2014-05-20 ENCOUNTER — Encounter (HOSPITAL_COMMUNITY): Payer: Self-pay | Admitting: Cardiovascular Disease

## 2014-05-27 ENCOUNTER — Emergency Department (HOSPITAL_COMMUNITY): Payer: BC Managed Care – PPO

## 2014-05-27 ENCOUNTER — Observation Stay (HOSPITAL_COMMUNITY)
Admission: EM | Admit: 2014-05-27 | Discharge: 2014-05-28 | Disposition: A | Discharge status: Home / Self Care | Payer: BC Managed Care – PPO | Attending: Internal Medicine | Admitting: Internal Medicine

## 2014-05-27 ENCOUNTER — Encounter (HOSPITAL_COMMUNITY): Payer: Self-pay | Admitting: Emergency Medicine

## 2014-05-27 DIAGNOSIS — I252 Old myocardial infarction: Secondary | ICD-10-CM | POA: Diagnosis not present

## 2014-05-27 DIAGNOSIS — I10 Essential (primary) hypertension: Secondary | ICD-10-CM | POA: Diagnosis not present

## 2014-05-27 DIAGNOSIS — E785 Hyperlipidemia, unspecified: Secondary | ICD-10-CM | POA: Diagnosis not present

## 2014-05-27 DIAGNOSIS — K219 Gastro-esophageal reflux disease without esophagitis: Secondary | ICD-10-CM | POA: Diagnosis not present

## 2014-05-27 DIAGNOSIS — R079 Chest pain, unspecified: Principal | ICD-10-CM | POA: Insufficient documentation

## 2014-05-27 DIAGNOSIS — I2510 Atherosclerotic heart disease of native coronary artery without angina pectoris: Secondary | ICD-10-CM | POA: Diagnosis not present

## 2014-05-27 DIAGNOSIS — R110 Nausea: Secondary | ICD-10-CM | POA: Diagnosis not present

## 2014-05-27 DIAGNOSIS — Z79899 Other long term (current) drug therapy: Secondary | ICD-10-CM | POA: Insufficient documentation

## 2014-05-27 DIAGNOSIS — I714 Abdominal aortic aneurysm, without rupture: Secondary | ICD-10-CM | POA: Diagnosis not present

## 2014-05-27 DIAGNOSIS — Z87891 Personal history of nicotine dependence: Secondary | ICD-10-CM | POA: Diagnosis not present

## 2014-05-27 DIAGNOSIS — G8929 Other chronic pain: Secondary | ICD-10-CM | POA: Insufficient documentation

## 2014-05-27 DIAGNOSIS — Z955 Presence of coronary angioplasty implant and graft: Secondary | ICD-10-CM

## 2014-05-27 DIAGNOSIS — J438 Other emphysema: Secondary | ICD-10-CM

## 2014-05-27 DIAGNOSIS — Z9889 Other specified postprocedural states: Secondary | ICD-10-CM | POA: Insufficient documentation

## 2014-05-27 DIAGNOSIS — Z880 Allergy status to penicillin: Secondary | ICD-10-CM | POA: Diagnosis not present

## 2014-05-27 DIAGNOSIS — R42 Dizziness and giddiness: Secondary | ICD-10-CM | POA: Diagnosis not present

## 2014-05-27 DIAGNOSIS — J449 Chronic obstructive pulmonary disease, unspecified: Secondary | ICD-10-CM | POA: Diagnosis not present

## 2014-05-27 DIAGNOSIS — R531 Weakness: Secondary | ICD-10-CM | POA: Insufficient documentation

## 2014-05-27 DIAGNOSIS — M1990 Unspecified osteoarthritis, unspecified site: Secondary | ICD-10-CM | POA: Insufficient documentation

## 2014-05-27 DIAGNOSIS — I209 Angina pectoris, unspecified: Secondary | ICD-10-CM

## 2014-05-27 DIAGNOSIS — I25118 Atherosclerotic heart disease of native coronary artery with other forms of angina pectoris: Secondary | ICD-10-CM

## 2014-05-27 DIAGNOSIS — Z7982 Long term (current) use of aspirin: Secondary | ICD-10-CM | POA: Diagnosis not present

## 2014-05-27 LAB — BASIC METABOLIC PANEL
Anion gap: 15 (ref 5–15)
BUN: 15 mg/dL (ref 6–23)
CHLORIDE: 98 meq/L (ref 96–112)
CO2: 21 mEq/L (ref 19–32)
Calcium: 8.9 mg/dL (ref 8.4–10.5)
Creatinine, Ser: 1 mg/dL (ref 0.50–1.10)
GFR, EST AFRICAN AMERICAN: 68 mL/min — AB (ref >90)
GFR, EST NON AFRICAN AMERICAN: 59 mL/min — AB (ref >90)
Glucose, Bld: 99 mg/dL (ref 70–99)
Potassium: 4.1 mEq/L (ref 3.7–5.3)
SODIUM: 134 meq/L — AB (ref 137–147)

## 2014-05-27 LAB — CBC WITH DIFFERENTIAL/PLATELET
BASOS PCT: 1 % (ref 0–1)
Basophils Absolute: 0 10*3/uL (ref 0.0–0.1)
EOS ABS: 0.1 10*3/uL (ref 0.0–0.7)
Eosinophils Relative: 1 % (ref 0–5)
HCT: 37 % (ref 36.0–46.0)
Hemoglobin: 12.2 g/dL (ref 12.0–15.0)
LYMPHS ABS: 2.8 10*3/uL (ref 0.7–4.0)
Lymphocytes Relative: 42 % (ref 12–46)
MCH: 31 pg (ref 26.0–34.0)
MCHC: 33 g/dL (ref 30.0–36.0)
MCV: 93.9 fL (ref 78.0–100.0)
Monocytes Absolute: 0.4 10*3/uL (ref 0.1–1.0)
Monocytes Relative: 7 % (ref 3–12)
NEUTROS ABS: 3.2 10*3/uL (ref 1.7–7.7)
Neutrophils Relative %: 49 % (ref 43–77)
PLATELETS: 254 10*3/uL (ref 150–400)
RBC: 3.94 MIL/uL (ref 3.87–5.11)
RDW: 12.9 % (ref 11.5–15.5)
WBC: 6.5 10*3/uL (ref 4.0–10.5)

## 2014-05-27 LAB — TROPONIN I: Troponin I: <0.3 ng/mL (ref <0.30)

## 2014-05-27 LAB — D-DIMER, QUANTITATIVE (NOT AT ARMC): D DIMER QUANT: 1.56 ug{FEU}/mL — AB (ref 0.00–0.48)

## 2014-05-27 MED ORDER — NITROGLYCERIN 0.4 MG SL SUBL: 0.4000 mg | SUBLINGUAL_TABLET | SUBLINGUAL | Status: DC | PRN

## 2014-05-27 MED ORDER — CITALOPRAM HYDROBROMIDE 20 MG PO TABS
20.0000 mg | ORAL_TABLET | Freq: Every day | ORAL | Status: DC
  Administered 2014-05-27 – 2014-05-28 (×2): 20 mg via ORAL
  Filled 2014-05-27 (×2): qty 1

## 2014-05-27 MED ORDER — PANTOPRAZOLE SODIUM 40 MG PO TBEC
40.0000 mg | DELAYED_RELEASE_TABLET | Freq: Every morning | ORAL | Status: DC
  Administered 2014-05-28: 40 mg via ORAL
  Filled 2014-05-27: qty 1

## 2014-05-27 MED ORDER — NITROGLYCERIN 0.4 MG SL SUBL
0.4000 mg | SUBLINGUAL_TABLET | SUBLINGUAL | Status: AC | PRN
  Administered 2014-05-27 (×3): 0.4 mg via SUBLINGUAL
  Filled 2014-05-27: qty 1

## 2014-05-27 MED ORDER — ALBUTEROL SULFATE (2.5 MG/3ML) 0.083% IN NEBU: 2.5000 mg | INHALATION_SOLUTION | Freq: Four times a day (QID) | RESPIRATORY_TRACT | Status: DC | PRN

## 2014-05-27 MED ORDER — SODIUM CHLORIDE 0.9 % IJ SOLN: 3.0000 mL | Freq: Two times a day (BID) | INTRAMUSCULAR | Status: DC

## 2014-05-27 MED ORDER — ACETAMINOPHEN 325 MG PO TABS: 325.0000 mg | ORAL_TABLET | Freq: Four times a day (QID) | ORAL | Status: DC | PRN

## 2014-05-27 MED ORDER — HEPARIN SODIUM (PORCINE) 5000 UNIT/ML IJ SOLN
5000.0000 [IU] | Freq: Three times a day (TID) | INTRAMUSCULAR | Status: DC
  Administered 2014-05-27 – 2014-05-28 (×2): 5000 [IU] via SUBCUTANEOUS
  Filled 2014-05-27 (×2): qty 1

## 2014-05-27 MED ORDER — ACETAMINOPHEN 325 MG PO TABS
650.0000 mg | ORAL_TABLET | Freq: Once | ORAL | Status: AC
  Administered 2014-05-27: 650 mg via ORAL
  Filled 2014-05-27: qty 2

## 2014-05-27 MED ORDER — SODIUM CHLORIDE 0.9 % IJ SOLN
3.0000 mL | Freq: Two times a day (BID) | INTRAMUSCULAR | Status: DC
Start: 2014-05-27 — End: 2014-05-28
  Administered 2014-05-27 – 2014-05-28 (×2): 3 mL via INTRAVENOUS

## 2014-05-27 MED ORDER — ONDANSETRON HCL 4 MG/2ML IJ SOLN: 4.0000 mg | Freq: Four times a day (QID) | INTRAMUSCULAR | Status: DC | PRN

## 2014-05-27 MED ORDER — ATORVASTATIN CALCIUM 40 MG PO TABS
40.0000 mg | ORAL_TABLET | Freq: Every day | ORAL | Status: DC
  Administered 2014-05-27 – 2014-05-28 (×2): 40 mg via ORAL
  Filled 2014-05-27 (×2): qty 1

## 2014-05-27 MED ORDER — IOHEXOL 350 MG/ML SOLN
100.0000 mL | Freq: Once | INTRAVENOUS | Status: AC | PRN
  Administered 2014-05-27: 100 mL via INTRAVENOUS

## 2014-05-27 MED ORDER — MONTELUKAST SODIUM 10 MG PO TABS
10.0000 mg | ORAL_TABLET | Freq: Every day | ORAL | Status: DC
  Administered 2014-05-27: 10 mg via ORAL
  Filled 2014-05-27: qty 1

## 2014-05-27 MED ORDER — ALBUTEROL SULFATE HFA 108 (90 BASE) MCG/ACT IN AERS
1.0000 | INHALATION_SPRAY | Freq: Four times a day (QID) | RESPIRATORY_TRACT | Status: DC | PRN
  Filled 2014-05-27: qty 6.7

## 2014-05-27 MED ORDER — ASPIRIN EC 81 MG PO TBEC
81.0000 mg | DELAYED_RELEASE_TABLET | Freq: Every day | ORAL | Status: DC
  Administered 2014-05-28: 81 mg via ORAL
  Filled 2014-05-27: qty 1

## 2014-05-27 MED ORDER — ISOSORBIDE MONONITRATE ER 30 MG PO TB24
30.0000 mg | ORAL_TABLET | Freq: Two times a day (BID) | ORAL | Status: DC
  Administered 2014-05-27 – 2014-05-28 (×2): 30 mg via ORAL
  Filled 2014-05-27 (×3): qty 1

## 2014-05-27 MED ORDER — METOPROLOL SUCCINATE ER 50 MG PO TB24
50.0000 mg | ORAL_TABLET | Freq: Two times a day (BID) | ORAL | Status: DC
  Administered 2014-05-27 – 2014-05-28 (×2): 50 mg via ORAL
  Filled 2014-05-27 (×2): qty 1

## 2014-05-27 MED ORDER — CYCLOBENZAPRINE HCL 10 MG PO TABS: 10.0000 mg | ORAL_TABLET | Freq: Three times a day (TID) | ORAL | Status: DC | PRN

## 2014-05-27 MED ORDER — ONDANSETRON HCL 4 MG PO TABS: 4.0000 mg | ORAL_TABLET | Freq: Four times a day (QID) | ORAL | Status: DC | PRN

## 2014-05-27 MED ORDER — TICAGRELOR 90 MG PO TABS
90.0000 mg | ORAL_TABLET | Freq: Two times a day (BID) | ORAL | Status: DC
  Administered 2014-05-27 – 2014-05-28 (×2): 90 mg via ORAL
  Filled 2014-05-27 (×8): qty 1

## 2014-05-27 MED ORDER — LISINOPRIL 5 MG PO TABS
5.0000 mg | ORAL_TABLET | Freq: Every day | ORAL | Status: DC
  Administered 2014-05-27 – 2014-05-28 (×2): 5 mg via ORAL
  Filled 2014-05-27 (×2): qty 1

## 2014-05-27 MED ORDER — ALPRAZOLAM 0.5 MG PO TABS
0.5000 mg | ORAL_TABLET | Freq: Every day | ORAL | Status: DC
  Administered 2014-05-27: 0.5 mg via ORAL
  Filled 2014-05-27: qty 1

## 2014-05-27 NOTE — ED Provider Notes (Signed)
CSN: [id number]     Arrival date & time 05/27/14  1248 History   First MD Initiated Contact with Patient 05/27/14 1308     Chief Complaint  Patient presents with  . Chest Pain    Patient is a 63 y.o. female presenting with chest pain. The history is provided by the patient and the spouse.  Chest Pain Pain location:  L chest Pain quality: sharp   Pain radiates to:  Upper back Pain radiates to the back: yes   Pain severity:  Severe Onset quality:  Sudden Duration:  30 minutes Progression:  Unchanged Chronicity:  New Relieved by:  Nothing Worsened by:  Deep breathing Ineffective treatments:  Aspirin and nitroglycerin Associated symptoms: dizziness, nausea, shortness of breath and weakness   Associated symptoms: no fever and no syncope   Risk factors: coronary artery disease   Risk factors: no prior DVT/PE   Patient presents with left sided sharp CP that started 30 minutes PTA She reports taking ASA/NTG without relief She feels as though she "might pass out"    Past Medical History  Diagnosis Date  . AAA (abdominal aortic aneurysm)   . Tobacco abuse   . HTN (hypertension)   . COPD (chronic obstructive pulmonary disease)   . Asthma   . HLD (hyperlipidemia)   . GERD (gastroesophageal reflux disease)   . Chronic low back pain     (Old vertebral fracture)  . Arthritis   . Coronary artery disease 09/08/13    with PCI  . NSTEMI (non-ST elevated myocardial infarction) 09/08/13  . Heart palpitations     prn toprol for rapid HR  . History of surgery on arm    Past Surgical History  Procedure Laterality Date  . Coronary stent placement  09/08/13    PCI with cutting balloon, PTCA,DES to prox LAD  . Coronary angioplasty with stent placement  09/10/13    PCI of RCA 90% proximal RCA stenosis with DES  . Laparoscopic nissen fundoplication      for severe reflux  . Cholecystectomy    . Abdominal hysterectomy    . 2d echo  12/2012    normal LV with mild LVH, grade 1 diastolic  dysfunction  . Doppler echocardiography  12/31/2012  . Nuclear stress test  05/09/2011    NORMAL PATTREN OF PERFUSION IN ALL REGIONS. LV IS NORMAL IS SIZE. EF 68% NO WALL MOTION ABNORMALITIES.  Marland Kitchen Abdominal aortic duplex  05/19/2012    WHEN COMPARED TO PRIOR STUDY DATED 06/01/11 THERE IS AN INCREASE IN SIZE OF DILATATION.  Marland Kitchen Left heart catheterization with coronary angiogram N/A 09/08/2013    Procedure: LEFT HEART CATHETERIZATION WITH CORONARY ANGIOGRAM;  Surgeon: Lennette Bihari, MD;  Location: [facility] CATH LAB;  Service: Cardiovascular;  Laterality: N/A;  . Percutaneous coronary stent intervention (pci-s) N/A 09/10/2013    Procedure: PERCUTANEOUS CORONARY STENT INTERVENTION (PCI-S);  Surgeon: Lennette Bihari, MD;  Location: [facility] CATH LAB;  Service: Cardiovascular;  Laterality: N/A;   Family History  Problem Relation Age of Onset  . Heart attack Father   . Heart attack Son 90  . Cancer Father     Esophageal  . Hypertension Mother    History  Substance Use Topics  . Smoking status: Former Smoker -- 0.50 packs/day for 37 years    Types: Cigarettes    Quit date: 09/08/2013  . Smokeless tobacco: Never Used  . Alcohol Use: No   OB History    No data available  Review of Systems  Constitutional: Negative for fever.  Respiratory: Positive for shortness of breath.   Cardiovascular: Positive for chest pain. Negative for syncope.  Gastrointestinal: Positive for nausea.  Neurological: Positive for dizziness, weakness and light-headedness. Negative for syncope.  All other systems reviewed and are negative.     Allergies  Bee venom; Penicillins; Hydrocodone; and Morphine and related  Home Medications   Prior to Admission medications   Medication Sig Start Date End Date Taking? Authorizing Provider  acetaminophen (TYLENOL) 325 MG tablet Take 325 mg by mouth every 6 (six) hours as needed for moderate pain.   Yes Historical Provider, MD  albuterol (PROVENTIL HFA;VENTOLIN HFA) 108 (90 BASE)  MCG/ACT inhaler Inhale 1 puff into the lungs every 6 (six) hours as needed for wheezing or shortness of breath.   Yes Historical Provider, MD  albuterol (PROVENTIL) (2.5 MG/3ML) 0.083% nebulizer solution Take 2.5 mg by nebulization every 6 (six) hours as needed for wheezing or shortness of breath.   Yes Historical Provider, MD  ALPRAZolam Prudy Feeler) 0.5 MG tablet Take 0.5 mg by mouth at bedtime. *Patient is prescribed one tablets three times daily as needed for anxiety   Yes Historical Provider, MD  aspirin EC 81 MG tablet Take 81 mg by mouth daily.   Yes Historical Provider, MD  atorvastatin (LIPITOR) 40 MG tablet Take 1 tablet (40 mg total) by mouth daily. 01/07/14  Yes Lennette Bihari, MD  citalopram (CELEXA) 20 MG tablet Take 20 mg by mouth daily.   Yes Historical Provider, MD  cyclobenzaprine (FLEXERIL) 10 MG tablet Take 1 tablet (10 mg total) by mouth 3 (three) times daily as needed for muscle spasms.   Yes Lennette Bihari, MD  isosorbide mononitrate (IMDUR) 30 MG 24 hr tablet Take 30 mg by mouth every evening.    Yes Historical Provider, MD  lisinopril (PRINIVIL,ZESTRIL) 5 MG tablet Take 1 tablet (5 mg total) by mouth daily. 09/14/13  Yes Brittainy Sherlynn Carbon, PA-C  metoprolol succinate (TOPROL-XL) 50 MG 24 hr tablet Take 1 tablet (50 mg total) by mouth 2 (two) times daily. Take with or immediately following a meal. 12/23/13  Yes Leone Brand, NP  montelukast (SINGULAIR) 10 MG tablet Take 10 mg by mouth at bedtime.   Yes Historical Provider, MD  NITROSTAT 0.4 MG SL tablet place 1 tablet under the tongue every 5 minutes for UP TO 3 doses if needed for angina as directed by prescriber 03/29/14  Yes Lennette Bihari, MD  pantoprazole (PROTONIX) 40 MG tablet Take 40 mg by mouth every morning.   Yes Historical Provider, MD  Ticagrelor (BRILINTA) 90 MG TABS tablet Take 1 tablet (90 mg total) by mouth 2 (two) times daily. 09/11/13  Yes Brittainy Sherlynn Carbon, PA-C  traMADol (ULTRAM) 50 MG tablet Take 2 tablets (100  mg total) by mouth every 6 (six) hours as needed. Patient not taking: Reported on 05/27/2014 01/13/14   Leone Brand, NP   BP 117/75 mmHg  Pulse 75  Temp(Src) 97.7 F (36.5 C)  Resp 15  SpO2 99% Physical Exam CONSTITUTIONAL: Well developed/well nourished, anxious HEAD: Normocephalic/atraumatic EYES: EOMI ENMT: Mucous membranes moist NECK: supple no meningeal signs SPINE/BACK:entire spine nontender CV: S1/S2 noted, no murmurs/rubs/gallops noted LUNGS: Lungs are clear to auscultation bilaterally, no apparent distress ABDOMEN: soft, nontender, no rebound or guarding, bowel sounds noted throughout abdomen NEURO: Pt is awake/alert/appropriate, moves all extremitiesx4.  No facial droop.   EXTREMITIES: pulses normal/equal, full ROM SKIN: warm, color normal  PSYCH: anxious  ED Course  Procedures     2:49 PM Pt reporting left sided sharp pleuritic pain.  She reports similar to prior MI however EKG/troponin negative.  I am concerned for PE.   Due to multiple drug allergies, only pain medication she can tolerate at this time is APAP 3:27 PM CT chest negative for PE She still reports sharp CP, will try NTG at this time Though she reports pain feels like prior MI, history more c/w pleurisy However given h/o cardiac disease and how uncomfortable she initially felt, will admit for chest pain r/o ACS D/w Dr Karilyn CotaGosrani with [facility] will admit He requested cardiology consult - I spoke to Dr Purvis SheffieldKoneswaran with cardiology.  He is aware of patient  Labs Review Labs Reviewed  BASIC METABOLIC PANEL - Abnormal; Notable for the following:    Sodium 134 (*)    GFR calc non Af Amer 59 (*)    GFR calc Af Amer 68 (*)    All other components within normal limits  D-DIMER, QUANTITATIVE - Abnormal; Notable for the following:    D-Dimer, Quant 1.56 (*)    All other components within normal limits  CBC WITH DIFFERENTIAL  TROPONIN I    Imaging Review Ct Angio Chest Pe W/cm &/or Wo Cm  05/27/2014    CLINICAL DATA:  Chest pain and shortness of breath  EXAM: CT ANGIOGRAPHY CHEST WITH CONTRAST  TECHNIQUE: Multidetector CT imaging of the chest was performed using the standard protocol during bolus administration of intravenous contrast. Multiplanar CT image reconstructions and MIPs were obtained to evaluate the vascular anatomy.  CONTRAST:  100mL OMNIPAQUE IOHEXOL 350 MG/ML SOLN  COMPARISON:  12/10/2013  FINDINGS: The lungs are well aerated bilaterally. Calcified granulomas are identified consistent with prior granulomatous disease. Mild emphysematous changes are seen. No focal infiltrate or sizable effusion is noted. No sizable parenchymal nodule is seen.  The thoracic aorta is branches show mild calcification. Coronary calcifications and stenting are seen. Pulmonary artery shows a normal branching pattern without evidence of focal filling defect to suggest pulmonary embolism. No hilar or mediastinal adenopathy is seen.  Visualized upper abdomen shows changes of prior cholecystectomy. A right renal cyst is seen. The bony structures show degenerative change of the thoracic spine as well as multiple compression deformities of T5, T6 and T7 as well as T12 which are chronic in nature. Schmorl's nodes are noted at T10 and T11 superiorly which were not present on the prior exam.  Review of the MIP images confirms the above findings.  IMPRESSION: No evidence of pulmonary emboli.  Changes of prior granulomatous disease.  Multiple old compression deformities. There are new superior endplate Schmorl's nodes noted at T10 and T11. These are of uncertain significance.   Electronically Signed   By: Alcide CleverMark  Lukens M.D.   On: 05/27/2014 14:48   Dg Chest Portable 1 View  05/27/2014   CLINICAL DATA:  LEFT-sided chest pain. Former cigarette smoker. Initial encounter.  EXAM: PORTABLE CHEST - 1 VIEW  COMPARISON:  None.  FINDINGS: Cardiopericardial silhouette within normal limits. Mediastinal contours normal. Trachea midline. No  airspace disease or effusion. Monitoring leads project over the chest.  IMPRESSION: No active disease.   Electronically Signed   By: Andreas NewportGeoffrey  Lamke M.D.   On: 05/27/2014 14:03     EKG Interpretation   Date/Time:  Thursday May 27 2014 12:56:40 EST Ventricular Rate:  71 PR Interval:  154 QRS Duration: 94 QT Interval:  402 QTC Calculation: 437 R Axis:  43 Text Interpretation:  Sinus rhythm Non-specific ST-t changes No  significant change since last tracing Confirmed by Bebe Shaggy  MD, Awais Cobarrubias  [phone]) on 05/27/2014 1:09:08 PM     Medications  nitroGLYCERIN (NITROSTAT) SL tablet 0.4 mg (0.4 mg Sublingual Given 05/27/14 1518)  acetaminophen (TYLENOL) tablet 650 mg (650 mg Oral Given 05/27/14 1411)  iohexol (OMNIPAQUE) 350 MG/ML injection 100 mL (100 mLs Intravenous Contrast Given 05/27/14 1421)    MDM   Final diagnoses:  Chest pain, rule out acute myocardial infarction    Nursing notes including past medical history and social history reviewed and considered in documentation xrays/imaging reviewed by myself and considered during evaluation Labs/vital reviewed myself and considered during evaluation Previous records reviewed and considered     Joya Gaskins, MD 05/27/14 1535

## 2014-05-27 NOTE — ED Notes (Signed)
MD at bedside. 

## 2014-05-27 NOTE — H&P (Signed)
[facility] History and Physical  [name] VAN:[id number] DOB: [birth year] DOA: 05/27/2014  Referring physician: ER PCP: Ernestine Conrad, MD   Chief Complaint: Chest pain  HPI: Olivia Wade is a 63 y.o. female  This is a 63 year old lady who has a history of coronary artery disease and who presents now with chest pain. The chest pain started approximately 3 hours ago was the patient was sitting in her car in a parking lot. The pain is mostly on the left side and appears to be sharp in nature. It has been associated with dyspnea. There is no sweating. There is no nausea. She denies any palpitations, cough or fever. After she came into the emergency room, she says that the pain is much improved after she received sublingual nitroglycerin. She is an ex smoker. She is hypertensive. This year, [name] She also has a history of gastroesophageal reflux disease, status post Nissen fundoplication.   Review of Systems:  Apart from the symptoms above, all other systems are negative.  Past Medical History  Diagnosis Date  . AAA (abdominal aortic aneurysm)   . Tobacco abuse   . HTN (hypertension)   . COPD (chronic obstructive pulmonary disease)   . Asthma   . HLD (hyperlipidemia)   . GERD (gastroesophageal reflux disease)   . Chronic low back pain     (Old vertebral fracture)  . Arthritis   . Coronary artery disease 09/08/13    with PCI  . NSTEMI (non-ST elevated myocardial infarction) 09/08/13  . Heart palpitations     prn toprol for rapid HR  . History of surgery on arm    Past Surgical History  Procedure Laterality Date  . Coronary stent placement  09/08/13    PCI with cutting balloon, PTCA,DES to prox LAD  . Coronary angioplasty with stent placement  09/10/13    PCI of RCA 90% proximal RCA stenosis with DES  . Laparoscopic nissen fundoplication      for severe reflux  . Cholecystectomy    .  Abdominal hysterectomy    . 2d echo  12/2012    normal LV with mild LVH, grade 1 diastolic dysfunction  . Doppler echocardiography  12/31/2012  . Nuclear stress test  05/09/2011    NORMAL PATTREN OF PERFUSION IN ALL REGIONS. LV IS NORMAL IS SIZE. EF 68% NO WALL MOTION ABNORMALITIES.  Marland Kitchen Abdominal aortic duplex  05/19/2012    WHEN COMPARED TO PRIOR STUDY DATED 06/01/11 THERE IS AN INCREASE IN SIZE OF DILATATION.  Marland Kitchen Left heart catheterization with coronary angiogram N/A 09/08/2013    Procedure: LEFT HEART CATHETERIZATION WITH CORONARY ANGIOGRAM;  Surgeon: Lennette Bihari, MD;  Location: [facility] CATH LAB;  Service: Cardiovascular;  Laterality: N/A;  . Percutaneous coronary stent intervention (pci-s) N/A 09/10/2013    Procedure: PERCUTANEOUS CORONARY STENT INTERVENTION (PCI-S);  Surgeon: Lennette Bihari, MD;  Location: [facility] CATH LAB;  Service: Cardiovascular;  Laterality: N/A;   Social History:  reports that she quit smoking about 8 months ago. Her smoking use included Cigarettes. She has a 18.5 pack-year smoking history. She has never used smokeless tobacco. She reports that she does not drink alcohol or use illicit drugs.  Allergies  Allergen Reactions  . Bee Venom Anaphylaxis, Hives and Swelling  . Penicillins Other (See Comments)    PATIENT REPORTS "MOTHER TOLD HER SHE HAD ALLERGY TO PCN"  . Hydrocodone Other (See Comments)  .  Morphine And Related Rash    Family History  Problem Relation Age of Onset  . Heart attack Father   . Heart attack Son [id number]  . Cancer Father     Esophageal  . Hypertension Mother      Prior to Admission medications   Medication Sig Start Date End Date Taking? Authorizing Provider  acetaminophen (TYLENOL) 325 MG tablet Take 325 mg by mouth every 6 (six) hours as needed for moderate pain.   Yes Historical Provider, MD  albuterol (PROVENTIL HFA;VENTOLIN HFA) 108 (90 BASE) MCG/ACT inhaler Inhale 1 puff into the lungs every 6 (six) hours as needed for wheezing or shortness of  breath.   Yes Historical Provider, MD  albuterol (PROVENTIL) (2.5 MG/3ML) 0.083% nebulizer solution Take 2.5 mg by nebulization every 6 (six) hours as needed for wheezing or shortness of breath.   Yes Historical Provider, MD  ALPRAZolam Prudy Feeler(XANAX) 0.5 MG tablet Take 0.5 mg by mouth at bedtime. *Patient is prescribed one tablets three times daily as needed for anxiety   Yes Historical Provider, MD  aspirin EC 81 MG tablet Take 81 mg by mouth daily.   Yes Historical Provider, MD  atorvastatin (LIPITOR) 40 MG tablet Take 1 tablet (40 mg total) by mouth daily. 01/07/14  Yes Lennette Biharihomas A Kelly, MD  citalopram (CELEXA) 20 MG tablet Take 20 mg by mouth daily.   Yes Historical Provider, MD  cyclobenzaprine (FLEXERIL) 10 MG tablet Take 1 tablet (10 mg total) by mouth 3 (three) times daily as needed for muscle spasms.   Yes Lennette Biharihomas A Kelly, MD  isosorbide mononitrate (IMDUR) 30 MG 24 hr tablet Take 30 mg by mouth every evening.    Yes Historical Provider, MD  lisinopril (PRINIVIL,ZESTRIL) 5 MG tablet Take 1 tablet (5 mg total) by mouth daily. 09/14/13  Yes Brittainy Sherlynn CarbonM Simmons, PA-C  metoprolol succinate (TOPROL-XL) 50 MG 24 hr tablet Take 1 tablet (50 mg total) by mouth 2 (two) times daily. Take with or immediately following a meal. 12/23/13  Yes Leone BrandLaura R Ingold, NP  montelukast (SINGULAIR) 10 MG tablet Take 10 mg by mouth at bedtime.   Yes Historical Provider, MD  NITROSTAT 0.4 MG SL tablet place 1 tablet under the tongue every 5 minutes for UP TO 3 doses if needed for angina as directed by prescriber 03/29/14  Yes Lennette Biharihomas A Kelly, MD  pantoprazole (PROTONIX) 40 MG tablet Take 40 mg by mouth every morning.   Yes Historical Provider, MD  Ticagrelor (BRILINTA) 90 MG TABS tablet Take 1 tablet (90 mg total) by mouth 2 (two) times daily. 09/11/13  Yes Brittainy Sherlynn CarbonM Simmons, PA-C  traMADol (ULTRAM) 50 MG tablet Take 2 tablets (100 mg total) by mouth every 6 (six) hours as needed. Patient not taking: Reported on 05/27/2014 01/13/14    Leone BrandLaura R Ingold, NP   Physical Exam: Filed Vitals:   05/27/14 1527 05/27/14 1530 05/27/14 1535 05/27/14 1545  BP: 117/75 105/79 127/81 135/123  Pulse: 75 75 73 82  Temp:      Resp:  11  14  SpO2:  95%  95%    Wt Readings from Last 3 Encounters:  05/04/14 67.042 kg (147 lb 12.8 oz)  03/27/14 60.782 kg (134 lb)  02/05/14 66.225 kg (146 lb)    General:  Appears calm and comfortable. She is not in obvious pain at the present time. Eyes: PERRL, normal lids, irises & conjunctiva ENT: grossly normal hearing, lips & tongue Neck: no LAD, masses or thyromegaly Cardiovascular: RRR,  no m/r/g. No LE edema. No pericardial rub. Telemetry: SR, no arrhythmias  Respiratory: CTA bilaterally, no w/r/r. Normal respiratory effort. No pleural rub. Abdomen: soft, ntnd Skin: no rash or induration seen on limited exam Musculoskeletal: grossly normal tone BUE/BLE Psychiatric: grossly normal mood and affect, speech fluent and appropriate Neurologic: grossly non-focal.          Labs on Admission:  Basic Metabolic Panel:  Recent Labs Lab 05/27/14 1300  NA 134*  K 4.1  CL 98  CO2 21  GLUCOSE 99  BUN 15  CREATININE 1.00  CALCIUM 8.9   Liver Function Tests: No results for input(s): AST, ALT, ALKPHOS, BILITOT, PROT, ALBUMIN in the last 168 hours. No results for input(s): LIPASE, AMYLASE in the last 168 hours. No results for input(s): AMMONIA in the last 168 hours. CBC:  Recent Labs Lab 05/27/14 1300  WBC 6.5  NEUTROABS 3.2  HGB 12.2  HCT 37.0  MCV 93.9  PLT 254   Cardiac Enzymes:  Recent Labs Lab 05/27/14 1300  TROPONINI <0.30    BNP (last 3 results)  Recent Labs  09/08/13 1055 03/27/14 1528  PROBNP 61.8 69.9   CBG: No results for input(s): GLUCAP in the last 168 hours.  Radiological Exams on Admission: Ct Angio Chest Pe W/cm &/or Wo Cm  05/27/2014   CLINICAL DATA:  Chest pain and shortness of breath  EXAM: CT ANGIOGRAPHY CHEST WITH CONTRAST  TECHNIQUE:  Multidetector CT imaging of the chest was performed using the standard protocol during bolus administration of intravenous contrast. Multiplanar CT image reconstructions and MIPs were obtained to evaluate the vascular anatomy.  CONTRAST:  OMNIPAQUE IOHEXOL 350 MG/ML SOLN  COMPARISON:  12/10/2013  FINDINGS: The lungs are well aerated bilaterally. Calcified granulomas are identified consistent with prior granulomatous disease. Mild emphysematous changes are seen. No focal infiltrate or sizable effusion is noted. No sizable parenchymal nodule is seen.  The thoracic aorta is branches show mild calcification. Coronary calcifications and stenting are seen. Pulmonary artery shows a normal branching pattern without evidence of focal filling defect to suggest pulmonary embolism. No hilar or mediastinal adenopathy is seen.  Visualized upper abdomen shows changes of prior cholecystectomy. A right renal cyst is seen. The bony structures show degenerative change of the thoracic spine as well as multiple compression deformities of T5, T6 and T7 as well as T12 which are chronic in nature. Schmorl's nodes are noted at T10 and T11 superiorly which were not present on the prior exam.  Review of the MIP images confirms the above findings.  IMPRESSION: No evidence of pulmonary emboli.  Changes of prior granulomatous disease.  Multiple old compression deformities. There are new superior endplate Schmorl's nodes noted at T10 and T11. These are of uncertain significance.   Electronically Signed   By: Alcide Clever M.D.   On: 05/27/2014 14:48   Dg Chest Portable 1 View  05/27/2014   CLINICAL DATA:  LEFT-sided chest pain. Former cigarette smoker. Initial encounter.  EXAM: PORTABLE CHEST - 1 VIEW  COMPARISON:  None.  FINDINGS: Cardiopericardial silhouette within normal limits. Mediastinal contours normal. Trachea midline. No airspace disease or effusion. Monitoring leads project over the chest.  IMPRESSION: No active disease.    Electronically Signed   By: Andreas Newport M.D.   On: 05/27/2014 14:03    EKG: Independently reviewed. Normal sinus rhythm without any acute ST-T wave changes.  Assessment/Plan   1. Chest pain. This is somewhat atypical but she relates this to very similar  description when she had her myocardial infarction, and I'm concerned about this pain being cardiac in nature. She has multiple risk factors and also a history of coronary artery disease. ECG does not show any acute changes. Initial troponin is negative. CT and grandma the chest does not indicate evidence of pulmonary embolism. We will cycle cardiac enzymes and cardiology consultation is appreciated. I agree with increasing oral nitrates. 2. Hypertension, controlled. 3. Hyperlipidemia. 4. Gastroesophageal reflux disease.   Further recommendations will depend on patient's hospital progress.  Code Status: Full code.  DVT Prophylaxis: Heparin.  Family Communication: I discussed the plan with the patient at the bedside.   Disposition Plan: Home when medically stable.  Time spent: 60 minutes.  Wilson Singer [facility] Pager [phone].

## 2014-05-27 NOTE — ED Notes (Signed)
Disregard bp 135/123 for 1545, cuff not on well.  Cuff repositioned and check done.

## 2014-05-27 NOTE — Consult Note (Signed)
CARDIOLOGY CONSULT NOTE  Patient ID: Olivia Wade MRN: [id number] DOB/AGE: 1950/08/16 63 y.o.  Admit date: 05/27/2014 Primary Physician Ernestine ConradBLUTH, KIRK, MD  Reason for Consultation: chest pain, CAD  HPI: The patient is a 63 yr old woman with h/o CAD with most recent PCI to the LAD and RCA with drug-eluting stents in late March and early April 2015, respectively. She follows with Dr. Daphene Jaegerom Kelly. She also has a h/o tobacco abuse for 37 years (quit in 08/2013), COPD, severe GERD s/p Nissen fundoplication, and asymptomatic abdominal aortic aneurysm. She was hospitalized in October 2015 for atypical chest pain, deemed musculoskeletal in etiology. Nuclear MPI study in 01/2014 did not show any evidence of ischemia and was deemed low risk. Echocardiography in 12/2012 showed normal LV systolic function and grade 1 diastolic dysfunction. She presented to the ED with two hours of chest pain. She had been out Christmas shopping and was going to stop to get something to eat. She has not eaten since yesterday due to nausea, but denies vomiting. When they pulled up to the restaurant, she began experiencing left-sided inframammary pain which radiated across to the right inframammary region and to the left side of her back. It is associated with profound dizziness, but she denies syncope. She has associated mild shortness of breath. She said this feels similar to the symptoms at the time of her MI, but also says symptoms are similar to GERD.  ECG today demonstrates normal sinus rhythm. Initial troponin is negative. D-dimer mildly elevated. CXR showed no active disease. CT angiogram negative for pulmonary embolism.   Allergies  Allergen Reactions  . Bee Venom Anaphylaxis, Hives and Swelling  . Penicillins Other (See Comments)    PATIENT REPORTS "MOTHER TOLD HER SHE HAD ALLERGY TO PCN"  . Hydrocodone Other (See Comments)  . Morphine And Related Rash    Current Facility-Administered Medications    Medication Dose Route Frequency Provider Last Rate Last Dose  . nitroGLYCERIN (NITROSTAT) SL tablet 0.4 mg  0.4 mg Sublingual Q5 min PRN Joya Gaskinsonald W Wickline, MD   0.4 mg at 05/27/14 1527   Current Outpatient Prescriptions  Medication Sig Dispense Refill  . acetaminophen (TYLENOL) 325 MG tablet Take 325 mg by mouth every 6 (six) hours as needed for moderate pain.    Marland Kitchen. albuterol (PROVENTIL HFA;VENTOLIN HFA) 108 (90 BASE) MCG/ACT inhaler Inhale 1 puff into the lungs every 6 (six) hours as needed for wheezing or shortness of breath.    Marland Kitchen. albuterol (PROVENTIL) (2.5 MG/3ML) 0.083% nebulizer solution Take 2.5 mg by nebulization every 6 (six) hours as needed for wheezing or shortness of breath.    . ALPRAZolam (XANAX) 0.5 MG tablet Take 0.5 mg by mouth at bedtime. *Patient is prescribed one tablets three times daily as needed for anxiety    . aspirin EC 81 MG tablet Take 81 mg by mouth daily.    Marland Kitchen. atorvastatin (LIPITOR) 40 MG tablet Take 1 tablet (40 mg total) by mouth daily. 30 tablet 9  . citalopram (CELEXA) 20 MG tablet Take 20 mg by mouth daily.    . cyclobenzaprine (FLEXERIL) 10 MG tablet Take 1 tablet (10 mg total) by mouth 3 (three) times daily as needed for muscle spasms. 30 tablet 6  . isosorbide mononitrate (IMDUR) 30 MG 24 hr tablet Take 30 mg by mouth every evening.     Marland Kitchen. lisinopril (PRINIVIL,ZESTRIL) 5 MG tablet Take 1 tablet (5 mg total) by mouth daily. 30 tablet 9  .  metoprolol succinate (TOPROL-XL) 50 MG 24 hr tablet Take 1 tablet (50 mg total) by mouth 2 (two) times daily. Take with or immediately following a meal. 60 tablet 6  . montelukast (SINGULAIR) 10 MG tablet Take 10 mg by mouth at bedtime.    Marland Kitchen NITROSTAT 0.4 MG SL tablet place 1 tablet under the tongue every 5 minutes for UP TO 3 doses if needed for angina as directed by prescriber 25 tablet 2  . pantoprazole (PROTONIX) 40 MG tablet Take 40 mg by mouth every morning.    . Ticagrelor (BRILINTA) 90 MG TABS tablet Take 1 tablet (90  mg total) by mouth 2 (two) times daily. 60 tablet 10  . traMADol (ULTRAM) 50 MG tablet Take 2 tablets (100 mg total) by mouth every 6 (six) hours as needed. (Patient not taking: Reported on 05/27/2014) 30 tablet 0    Past Medical History  Diagnosis Date  . AAA (abdominal aortic aneurysm)   . Tobacco abuse   . HTN (hypertension)   . COPD (chronic obstructive pulmonary disease)   . Asthma   . HLD (hyperlipidemia)   . GERD (gastroesophageal reflux disease)   . Chronic low back pain     (Old vertebral fracture)  . Arthritis   . Coronary artery disease 09/08/13    with PCI  . NSTEMI (non-ST elevated myocardial infarction) 09/08/13  . Heart palpitations     prn toprol for rapid HR  . History of surgery on arm     Past Surgical History  Procedure Laterality Date  . Coronary stent placement  09/08/13    PCI with cutting balloon, PTCA,DES to prox LAD  . Coronary angioplasty with stent placement  09/10/13    PCI of RCA 90% proximal RCA stenosis with DES  . Laparoscopic nissen fundoplication      for severe reflux  . Cholecystectomy    . Abdominal hysterectomy    . 2d echo  12/2012    normal LV with mild LVH, grade 1 diastolic dysfunction  . Doppler echocardiography  12/31/2012  . Nuclear stress test  05/09/2011    NORMAL PATTREN OF PERFUSION IN ALL REGIONS. LV IS NORMAL IS SIZE. EF 68% NO WALL MOTION ABNORMALITIES.  Marland Kitchen Abdominal aortic duplex  05/19/2012    WHEN COMPARED TO PRIOR STUDY DATED 06/01/11 THERE IS AN INCREASE IN SIZE OF DILATATION.  Marland Kitchen Left heart catheterization with coronary angiogram N/A 09/08/2013    Procedure: LEFT HEART CATHETERIZATION WITH CORONARY ANGIOGRAM;  Surgeon: Lennette Bihari, MD;  Location: [facility] CATH LAB;  Service: Cardiovascular;  Laterality: N/A;  . Percutaneous coronary stent intervention (pci-s) N/A 09/10/2013    Procedure: PERCUTANEOUS CORONARY STENT INTERVENTION (PCI-S);  Surgeon: Lennette Bihari, MD;  Location: [facility] CATH LAB;  Service: Cardiovascular;  Laterality:  N/A;    History   Social History  . Marital Status: Married    Spouse Name: N/A    Number of Children: N/A  . Years of Education: N/A   Occupational History  . Not on file.   Social History Main Topics  . Smoking status: Former Smoker -- 0.50 packs/day for 37 years    Types: Cigarettes    Quit date: 09/08/2013  . Smokeless tobacco: Never Used  . Alcohol Use: No  . Drug Use: No  . Sexual Activity: Not on file   Other Topics Concern  . Not on file   Social History Narrative     No family history of premature CAD in 1st degree relatives.  Prior to Admission medications   Medication Sig Start Date End Date Taking? Authorizing Provider  acetaminophen (TYLENOL) 325 MG tablet Take 325 mg by mouth every 6 (six) hours as needed for moderate pain.   Yes Historical Provider, MD  albuterol (PROVENTIL HFA;VENTOLIN HFA) 108 (90 BASE) MCG/ACT inhaler Inhale 1 puff into the lungs every 6 (six) hours as needed for wheezing or shortness of breath.   Yes Historical Provider, MD  albuterol (PROVENTIL) (2.5 MG/3ML) 0.083% nebulizer solution Take 2.5 mg by nebulization every 6 (six) hours as needed for wheezing or shortness of breath.   Yes Historical Provider, MD  ALPRAZolam Prudy Feeler) 0.5 MG tablet Take 0.5 mg by mouth at bedtime. *Patient is prescribed one tablets three times daily as needed for anxiety   Yes Historical Provider, MD  aspirin EC 81 MG tablet Take 81 mg by mouth daily.   Yes Historical Provider, MD  atorvastatin (LIPITOR) 40 MG tablet Take 1 tablet (40 mg total) by mouth daily. 01/07/14  Yes Lennette Bihari, MD  citalopram (CELEXA) 20 MG tablet Take 20 mg by mouth daily.   Yes Historical Provider, MD  cyclobenzaprine (FLEXERIL) 10 MG tablet Take 1 tablet (10 mg total) by mouth 3 (three) times daily as needed for muscle spasms.   Yes Lennette Bihari, MD  isosorbide mononitrate (IMDUR) 30 MG 24 hr tablet Take 30 mg by mouth every evening.    Yes Historical Provider, MD  lisinopril  (PRINIVIL,ZESTRIL) 5 MG tablet Take 1 tablet (5 mg total) by mouth daily. 09/14/13  Yes Brittainy Sherlynn Carbon, PA-C  metoprolol succinate (TOPROL-XL) 50 MG 24 hr tablet Take 1 tablet (50 mg total) by mouth 2 (two) times daily. Take with or immediately following a meal. 12/23/13  Yes Leone Brand, NP  montelukast (SINGULAIR) 10 MG tablet Take 10 mg by mouth at bedtime.   Yes Historical Provider, MD  NITROSTAT 0.4 MG SL tablet place 1 tablet under the tongue every 5 minutes for UP TO 3 doses if needed for angina as directed by prescriber 03/29/14  Yes Lennette Bihari, MD  pantoprazole (PROTONIX) 40 MG tablet Take 40 mg by mouth every morning.   Yes Historical Provider, MD  Ticagrelor (BRILINTA) 90 MG TABS tablet Take 1 tablet (90 mg total) by mouth 2 (two) times daily. 09/11/13  Yes Brittainy Sherlynn Carbon, PA-C  traMADol (ULTRAM) 50 MG tablet Take 2 tablets (100 mg total) by mouth every 6 (six) hours as needed. Patient not taking: Reported on 05/27/2014 01/13/14   Leone Brand, NP     Review of systems complete and found to be negative unless listed above in HPI     Physical exam Blood pressure 117/75, pulse 75, temperature 97.7 F (36.5 C), resp. rate 15, SpO2 99 %. General: NAD, anxious Neck: No JVD, no thyromegaly or thyroid nodule.  Lungs: Faint dry crackles at bases, no rales or wheezes, with normal respiratory effort. CV: Nondisplaced PMI. Regular rate and rhythm, normal S1/S2, no S3/S4, no murmur.  No peripheral edema.  No carotid bruit.  Normal pedal pulses.  Abdomen: Soft, nontender, no hepatosplenomegaly, no distention.  Skin: Intact without lesions or rashes.  Neurologic: Alert and oriented x 3.  Psych: Normal affect. Extremities: No clubbing or cyanosis.  HEENT: Normal.   ECG: Most recent ECG reviewed.  Labs:   Lab Results  Component Value Date   WBC 6.5 05/27/2014   HGB 12.2 05/27/2014   HCT 37.0 05/27/2014   MCV 93.9 05/27/2014  PLT 254 05/27/2014    Recent Labs Lab  05/27/14 1300  NA 134*  K 4.1  CL 98  CO2 21  BUN 15  CREATININE 1.00  CALCIUM 8.9  GLUCOSE 99   Lab Results  Component Value Date   CKTOTAL 113 09/13/2013   CKMB 3.5 09/13/2013   TROPONINI <0.30 05/27/2014    Lab Results  Component Value Date   CHOL 128 03/28/2014   CHOL 132 12/23/2013   CHOL 118 10/17/2013   Lab Results  Component Value Date   HDL 30* 03/28/2014   HDL 32* 12/23/2013   HDL 31* 10/17/2013   Lab Results  Component Value Date   LDLCALC 31 03/28/2014   LDLCALC 50 12/23/2013   LDLCALC 47 10/17/2013   Lab Results  Component Value Date   TRIG 337* 03/28/2014   TRIG 250* 12/23/2013   TRIG 200* 10/17/2013   Lab Results  Component Value Date   CHOLHDL 4.3 03/28/2014   CHOLHDL 4.1 12/23/2013   CHOLHDL 3.8 10/17/2013   No results found for: LDLDIRECT       Studies: Ct Angio Chest Pe W/cm &/or Wo Cm  05/27/2014   CLINICAL DATA:  Chest pain and shortness of breath  EXAM: CT ANGIOGRAPHY CHEST WITH CONTRAST  TECHNIQUE: Multidetector CT imaging of the chest was performed using the standard protocol during bolus administration of intravenous contrast. Multiplanar CT image reconstructions and MIPs were obtained to evaluate the vascular anatomy.  CONTRAST:  OMNIPAQUE IOHEXOL 350 MG/ML SOLN  COMPARISON:  12/10/2013  FINDINGS: The lungs are well aerated bilaterally. Calcified granulomas are identified consistent with prior granulomatous disease. Mild emphysematous changes are seen. No focal infiltrate or sizable effusion is noted. No sizable parenchymal nodule is seen.  The thoracic aorta is branches show mild calcification. Coronary calcifications and stenting are seen. Pulmonary artery shows a normal branching pattern without evidence of focal filling defect to suggest pulmonary embolism. No hilar or mediastinal adenopathy is seen.  Visualized upper abdomen shows changes of prior cholecystectomy. A right renal cyst is seen. The bony structures show  degenerative change of the thoracic spine as well as multiple compression deformities of T5, T6 and T7 as well as T12 which are chronic in nature. Schmorl's nodes are noted at T10 and T11 superiorly which were not present on the prior exam.  Review of the MIP images confirms the above findings.  IMPRESSION: No evidence of pulmonary emboli.  Changes of prior granulomatous disease.  Multiple old compression deformities. There are new superior endplate Schmorl's nodes noted at T10 and T11. These are of uncertain significance.   Electronically Signed   By: Alcide Clever M.D.   On: 05/27/2014 14:48   Dg Chest Portable 1 View  05/27/2014   CLINICAL DATA:  LEFT-sided chest pain. Former cigarette smoker. Initial encounter.  EXAM: PORTABLE CHEST - 1 VIEW  COMPARISON:  None.  FINDINGS: Cardiopericardial silhouette within normal limits. Mediastinal contours normal. Trachea midline. No airspace disease or effusion. Monitoring leads project over the chest.  IMPRESSION: No active disease.   Electronically Signed   By: Andreas Newport M.D.   On: 05/27/2014 14:03    ASSESSMENT AND PLAN:  1. Chest pain in setting of CAD with prior PCI: Symptoms similar to prior MI as well as GERD. Initial troponin normal. Continue to cycle. ECG unremarkable. No ischemia by stress testing in 01/2014, thus no indication to repeat at this time. Continue medical therapy with ASA, Lipitor, metoprolol, and Brilinta. I will increase  Imdur to 30 mg bid for now. No need for heparin at this time. 2. Hyperlipidemia: Continue Lipitor. 3. GERD: Continue Protonix 40 mg daily. 4. COPD: Stable and without exacerbation. Continue home meds. 5. Essential HTN: Reasonably controlled at present. Continue lisinopril 5 mg daily. 6. AAA: Stable when last imaged. Repeat imaging in 2016.   Signed: Prentice Docker, M.D., F.A.C.C.  05/27/2014, 3:31 PM

## 2014-05-27 NOTE — ED Notes (Signed)
Had chest pain at 12:30.  Took 4 baby ASA and 2 NTG sl with mild relief.  Rate pain 10.

## 2014-05-27 NOTE — ED Notes (Signed)
PCXR done

## 2014-05-28 DIAGNOSIS — I2510 Atherosclerotic heart disease of native coronary artery without angina pectoris: Secondary | ICD-10-CM

## 2014-05-28 DIAGNOSIS — R072 Precordial pain: Secondary | ICD-10-CM

## 2014-05-28 DIAGNOSIS — I2584 Coronary atherosclerosis due to calcified coronary lesion: Secondary | ICD-10-CM

## 2014-05-28 LAB — COMPREHENSIVE METABOLIC PANEL
ALBUMIN: 3.9 g/dL (ref 3.5–5.2)
ALK PHOS: 94 U/L (ref 39–117)
ALT: 14 U/L (ref 0–35)
ANION GAP: 15 (ref 5–15)
AST: 17 U/L (ref 0–37)
BILIRUBIN TOTAL: 0.7 mg/dL (ref 0.3–1.2)
BUN: 12 mg/dL (ref 6–23)
CHLORIDE: 104 meq/L (ref 96–112)
CO2: 20 mEq/L (ref 19–32)
Calcium: 9.2 mg/dL (ref 8.4–10.5)
Creatinine, Ser: 0.81 mg/dL (ref 0.50–1.10)
GFR calc Af Amer: 88 mL/min — ABNORMAL LOW (ref >90)
GFR calc non Af Amer: 76 mL/min — ABNORMAL LOW (ref >90)
GLUCOSE: 96 mg/dL (ref 70–99)
POTASSIUM: 3.9 meq/L (ref 3.7–5.3)
Sodium: 139 mEq/L (ref 137–147)
Total Protein: 7.7 g/dL (ref 6.0–8.3)

## 2014-05-28 LAB — TROPONIN I: Troponin I: <0.3 ng/mL (ref <0.30)

## 2014-05-28 MED ORDER — ISOSORBIDE MONONITRATE ER 30 MG PO TB24: 30.0000 mg | ORAL_TABLET | Freq: Two times a day (BID) | ORAL | Status: DC

## 2014-05-28 MED ORDER — ZOLPIDEM TARTRATE 5 MG PO TABS
5.0000 mg | ORAL_TABLET | Freq: Every evening | ORAL | Status: DC | PRN
  Administered 2014-05-28: 5 mg via ORAL
  Filled 2014-05-28: qty 1

## 2014-05-28 NOTE — Progress Notes (Signed)
Spoke with patient about prescription for IMDUR. Pt states she will get a prescription from her primary care MD on Monday.

## 2014-05-28 NOTE — Progress Notes (Signed)
Pt alert and oriented. Discharged instructions reviewed and discussed and pt verbalized understanding through teach-back. Pt aware of follow-up appointments. IV removed; site clean, dry, and intact. Pt Stable upon discharge.

## 2014-05-28 NOTE — Discharge Summary (Signed)
Physician Discharge Summary  Olivia Wade GQQ:[id number] DOB: 07/20/1950 DOA: 05/27/2014  PCP: Ernestine Conrad, MD  Admit date: 05/27/2014 Discharge date: 05/28/2014  Time spent: 45 minutes  Recommendations for Outpatient Follow-up:  -Will be discharged home today. -Advised to follow up with PCP in 2 weeks.   Discharge Diagnoses:  Active Problems:   HTN (hypertension)   Hyperlipidemia   GERD (gastroesophageal reflux disease)   CAD (coronary artery disease), recent DES stents to RCA and LAD in 3/15 and 4/15   Chest pain   Discharge Condition: Stable and improved  Filed Weights   05/27/14 1747  Weight: 63.957 kg (141 lb)    History of present illness:  This is a 63 year old lady who has a history of coronary artery disease and who presents now with chest pain. The chest pain started approximately 3 hours ago was the patient was sitting in her car in a parking lot. The pain is mostly on the left side and appears to be sharp in nature. It has been associated with dyspnea. There is no sweating. There is no nausea. She denies any palpitations, cough or fever. After she came into the emergency room, she says that the pain is much improved after she received sublingual nitroglycerin. She is an ex smoker. She is hypertensive. This year, [name] She also has a history of gastroesophageal reflux disease, status post Nissen fundoplication.  Hospital Course:   Chest Pain -In a patient with h/o CAD with prior PCI. Stress test in 8/15 without signs of ischemia. -Has ruled out for ACS. -Has been seen by cards without recommendation for further work up. -Add Imdur 30 mg BID to regimen.  Rest of medical conditions have been stable.   Procedures:  None   Consultations:  Cardiology  Discharge Instructions  Discharge Instructions    Diet - low sodium heart healthy    Complete by:  As directed      Increase activity slowly    Complete by:  As directed             Medication List    TAKE these medications        acetaminophen 325 MG tablet  Commonly known as:  TYLENOL  Take 325 mg by mouth every 6 (six) hours as needed for moderate pain.     albuterol 108 (90 BASE) MCG/ACT inhaler  Commonly known as:  PROVENTIL HFA;VENTOLIN HFA  Inhale 1 puff into the lungs every 6 (six) hours as needed for wheezing or shortness of breath.     albuterol (2.5 MG/3ML) 0.083% nebulizer solution  Commonly known as:  PROVENTIL  Take 2.5 mg by nebulization every 6 (six) hours as needed for wheezing or shortness of breath.     ALPRAZolam 0.5 MG tablet  Commonly known as:  XANAX  Take 0.5 mg by mouth at bedtime. *Patient is prescribed one tablets three times daily as needed for anxiety     aspirin EC 81 MG tablet  Take 81 mg by mouth daily.     atorvastatin 40 MG tablet  Commonly known as:  LIPITOR  Take 1 tablet (40 mg total) by mouth daily.     citalopram 20 MG tablet  Commonly known as:  CELEXA  Take 20 mg by mouth daily.     cyclobenzaprine 10 MG tablet  Commonly known as:  FLEXERIL  Take 1 tablet (10 mg total) by mouth  3 (three) times daily as needed for muscle spasms.     isosorbide mononitrate 30 MG 24 hr tablet  Commonly known as:  IMDUR  Take 1 tablet (30 mg total) by mouth 2 (two) times daily.     lisinopril 5 MG tablet  Commonly known as:  PRINIVIL,ZESTRIL  Take 1 tablet (5 mg total) by mouth daily.     metoprolol succinate 50 MG 24 hr tablet  Commonly known as:  TOPROL-XL  Take 1 tablet (50 mg total) by mouth 2 (two) times daily. Take with or immediately following a meal.     montelukast 10 MG tablet  Commonly known as:  SINGULAIR  Take 10 mg by mouth at bedtime.     NITROSTAT 0.4 MG SL tablet  Generic drug:  nitroGLYCERIN  place 1 tablet under the tongue every 5 minutes for UP TO 3 doses if needed for angina as directed by prescriber     pantoprazole 40 MG tablet   Commonly known as:  PROTONIX  Take 40 mg by mouth every morning.     ticagrelor 90 MG Tabs tablet  Commonly known as:  BRILINTA  Take 1 tablet (90 mg total) by mouth 2 (two) times daily.     traMADol 50 MG tablet  Commonly known as:  ULTRAM  Take 2 tablets (100 mg total) by mouth every 6 (six) hours as needed.       Allergies  Allergen Reactions  . Bee Venom Anaphylaxis, Hives and Swelling  . Penicillins Other (See Comments)    PATIENT REPORTS "MOTHER TOLD HER SHE HAD ALLERGY TO PCN"  . Hydrocodone Other (See Comments)  . Morphine And Related Rash       Follow-up Information    Follow up with Ernestine Conrad, MD. Schedule an appointment as soon as possible for a visit in 2 weeks.   Specialty:  Family Medicine   Contact information:   [address] [place] [address] [place] [address] [phone]        The results of significant diagnostics from this hospitalization (including imaging, microbiology, ancillary and laboratory) are listed below for reference.    Significant Diagnostic Studies: Ct Angio Chest Pe W/cm &/or Wo Cm  05/27/2014   CLINICAL DATA:  Chest pain and shortness of breath  EXAM: CT ANGIOGRAPHY CHEST WITH CONTRAST  TECHNIQUE: Multidetector CT imaging of the chest was performed using the standard protocol during bolus administration of intravenous contrast. Multiplanar CT image reconstructions and MIPs were obtained to evaluate the vascular anatomy.  CONTRAST:  OMNIPAQUE IOHEXOL 350 MG/ML SOLN  COMPARISON:  12/10/2013  FINDINGS: The lungs are well aerated bilaterally. Calcified granulomas are identified consistent with prior granulomatous disease. Mild emphysematous changes are seen. No focal infiltrate or sizable effusion is noted. No sizable parenchymal nodule is seen.  The thoracic aorta is branches show mild calcification. Coronary calcifications and stenting are seen. Pulmonary artery shows a normal branching pattern without evidence of focal filling defect to  suggest pulmonary embolism. No hilar or mediastinal adenopathy is seen.  Visualized upper abdomen shows changes of prior cholecystectomy. A right renal cyst is seen. The bony structures show degenerative change of the thoracic spine as well as multiple compression deformities of T5, T6 and T7 as well as T12 which are chronic in nature. Schmorl's nodes are noted at T10 and T11 superiorly which were not present on the prior exam.  Review of the MIP images confirms the above findings.  IMPRESSION: No evidence of pulmonary emboli.  Changes of prior granulomatous disease.  Multiple old compression deformities. There are new superior endplate Schmorl's nodes noted at T10 and T11. These are of uncertain significance.   Electronically Signed   By: Alcide CleverMark  Lukens M.D.   On: 05/27/2014 14:48   Dg Chest Portable 1 View  05/27/2014   CLINICAL DATA:  LEFT-sided chest pain. Former cigarette smoker. Initial encounter.  EXAM: PORTABLE CHEST - 1 VIEW  COMPARISON:  None.  FINDINGS: Cardiopericardial silhouette within normal limits. Mediastinal contours normal. Trachea midline. No airspace disease or effusion. Monitoring leads project over the chest.  IMPRESSION: No active disease.   Electronically Signed   By: Andreas NewportGeoffrey  Lamke M.D.   On: 05/27/2014 14:03    Microbiology: No results found for this or any previous visit (from the past 240 hour(s)).   Labs: Basic Metabolic Panel:  Recent Labs Lab 05/27/14 1300 05/28/14 0419  NA 134* 139  K 4.1 3.9  CL 98 104  CO2 21 20  GLUCOSE 99 96  BUN 15 12  CREATININE 1.00 0.81  CALCIUM 8.9 9.2   Liver Function Tests:  Recent Labs Lab 05/28/14 0419  AST 17  ALT 14  ALKPHOS 94  BILITOT 0.7  PROT 7.7  ALBUMIN 3.9   No results for input(s): LIPASE, AMYLASE in the last 168 hours. No results for input(s): AMMONIA in the last 168 hours. CBC:  Recent Labs Lab 05/27/14 1300  WBC 6.5  NEUTROABS 3.2  HGB 12.2  HCT 37.0  MCV 93.9  PLT 254   Cardiac  Enzymes:  Recent Labs Lab 05/27/14 1300 05/27/14 1705 05/27/14 2150 05/28/14 0419  TROPONINI <0.30 <0.30 <0.30 <0.30   BNP: BNP (last 3 results)  Recent Labs  09/08/13 1055 03/27/14 1528  PROBNP 61.8 69.9   CBG: No results for input(s): GLUCAP in the last 168 hours.     SignedChaya Jan:  HERNANDEZ ACOSTA,ESTELA  [facility] Pager: [phone] 05/28/2014, 12:52 PM

## 2014-05-28 NOTE — Care Management Note (Signed)
    Page 1 of 1   05/28/2014     10:48:37 AM CARE MANAGEMENT NOTE 05/28/2014  Patient:  Olivia Wade, Olivia Wade   Account Number:  [id number]  Date Initiated:  05/28/2014  Documentation initiated by:  Kathyrn Sheriff  Subjective/Objective Assessment:   Pt is from home with self care. Pt has no HH services, DME's or med needs prior to admission. Pt plans to dsicharge home with self care. No CM needs.     Action/Plan:   Anticipated DC Date:  05/28/2014   Anticipated DC Plan:  HOME/SELF CARE      DC Planning Services  CM consult      Choice offered to / List presented to:             Status of service:  Completed, signed off Medicare Important Message given?   (If response is "NO", the following Medicare IM given date fields will be blank) Date Medicare IM given:   Medicare IM given by:   Date Additional Medicare IM given:   Additional Medicare IM given by:    Discharge Disposition:  HOME/SELF CARE  Per UR Regulation:    If discussed at Long Length of Stay Meetings, dates discussed:    Comments:  05/28/2014 1030 Kathyrn Sheriff, RN, MSN, [facility]

## 2014-05-28 NOTE — Progress Notes (Signed)
SUBJECTIVE: Pt feels very well. Denies chest pain. Wants to go home.     Intake/Output Summary (Last 24 hours) at 05/28/14 1006 Last data filed at 05/27/14 1700  Gross per 24 hour  Intake    240 ml  Output      0 ml  Net    240 ml    Current Facility-Administered Medications  Medication Dose Route Frequency Provider Last Rate Last Dose  . acetaminophen (TYLENOL) tablet 325 mg  325 mg Oral Q6H PRN Nimish C Gosrani, MD      . albuterol (PROVENTIL) (2.5 MG/3ML) 0.083% nebulizer solution 2.5 mg  2.5 mg Nebulization Q6H PRN Nimish C Gosrani, MD      . ALPRAZolam Prudy Feeler) tablet 0.5 mg  0.5 mg Oral QHS Nimish C Gosrani, MD   0.5 mg at 05/27/14 2233  . aspirin EC tablet 81 mg  81 mg Oral Daily Nimish C Karilyn Cota, MD   81 mg at 05/27/14 1645  . atorvastatin (LIPITOR) tablet 40 mg  40 mg Oral Daily Nimish C Karilyn Cota, MD   40 mg at 05/27/14 1739  . citalopram (CELEXA) tablet 20 mg  20 mg Oral Daily Nimish C Karilyn Cota, MD   20 mg at 05/27/14 1739  . cyclobenzaprine (FLEXERIL) tablet 10 mg  10 mg Oral TID PRN Nimish Normajean Glasgow, MD      . heparin injection 5,000 Units  5,000 Units Subcutaneous 3 times per day Wilson Singer, MD   5,000 Units at 05/28/14 0556  . isosorbide mononitrate (IMDUR) 24 hr tablet 30 mg  30 mg Oral BID Laqueta Linden, MD   30 mg at 05/27/14 1739  . lisinopril (PRINIVIL,ZESTRIL) tablet 5 mg  5 mg Oral Daily Nimish Normajean Glasgow, MD   5 mg at 05/27/14 1739  . metoprolol succinate (TOPROL-XL) 24 hr tablet 50 mg  50 mg Oral BID Wilson Singer, MD   50 mg at 05/27/14 2233  . montelukast (SINGULAIR) tablet 10 mg  10 mg Oral QHS Wilson Singer, MD   10 mg at 05/27/14 2233  . nitroGLYCERIN (NITROSTAT) SL tablet 0.4 mg  0.4 mg Sublingual Q5 min PRN Nimish C Gosrani, MD      . ondansetron (ZOFRAN) tablet 4 mg  4 mg Oral Q6H PRN Nimish Normajean Glasgow, MD       Or  . ondansetron (ZOFRAN) injection 4 mg  4 mg Intravenous Q6H PRN Nimish C Gosrani, MD      . pantoprazole (PROTONIX)  EC tablet 40 mg  40 mg Oral q morning - 10a Nimish C Gosrani, MD      . sodium chloride 0.9 % injection 3 mL  3 mL Intravenous Q12H Nimish Normajean Glasgow, MD   3 mL at 05/27/14 2236  . ticagrelor (BRILINTA) tablet 90 mg  90 mg Oral BID Wilson Singer, MD   90 mg at 05/27/14 2233  . zolpidem (AMBIEN) tablet 5 mg  5 mg Oral QHS PRN Alice Reichert, MD   5 mg at 05/28/14 0045    Filed Vitals:   05/27/14 1737 05/27/14 1747 05/27/14 2224 05/28/14 0554  BP: 133/93  106/65 97/74  Pulse: 77  77 87  Temp: 97.9 F (36.6 C)  97.9 F (36.6 C) 98.1 F (36.7 C)  TempSrc:   Oral Oral  Resp: 16  16 16   Height:  5\' 3"  (1.6 m)    Weight:  141 lb (63.957 kg)    SpO2:  100%  96% 100%    PHYSICAL EXAM General: NAD HEENT: Normal. Neck: No JVD, no thyromegaly.  Lungs: Clear to auscultation bilaterally with normal respiratory effort. CV: Nondisplaced PMI.  Regular rate and rhythm, normal S1/S2, no S3/S4, no murmur.  No pretibial edema.  No carotid bruit.  Normal pedal pulses.  Abdomen: Soft, nontender, no hepatosplenomegaly, no distention.  Neurologic: Alert and oriented x 3.  Psych: Normal affect. Musculoskeletal: Normal range of motion. No gross deformities. Extremities: No clubbing or cyanosis.   TELEMETRY: Reviewed telemetry pt in sinus rhythm.  LABS: Basic Metabolic Panel:  Recent Labs  16/03/9611/17/15 1300 05/28/14 0419  NA 134* 139  K 4.1 3.9  CL 98 104  CO2 21 20  GLUCOSE 99 96  BUN 15 12  CREATININE 1.00 0.81  CALCIUM 8.9 9.2   Liver Function Tests:  Recent Labs  05/28/14 0419  AST 17  ALT 14  ALKPHOS 94  BILITOT 0.7  PROT 7.7  ALBUMIN 3.9   No results for input(s): LIPASE, AMYLASE in the last 72 hours. CBC:  Recent Labs  05/27/14 1300  WBC 6.5  NEUTROABS 3.2  HGB 12.2  HCT 37.0  MCV 93.9  PLT 254   Cardiac Enzymes:  Recent Labs  05/27/14 1705 05/27/14 2150 05/28/14 0419  TROPONINI <0.30 <0.30 <0.30   BNP: Invalid input(s): POCBNP D-Dimer:  Recent Labs   05/27/14 1300  DDIMER 1.56*   Hemoglobin A1C: No results for input(s): HGBA1C in the last 72 hours. Fasting Lipid Panel: No results for input(s): CHOL, HDL, LDLCALC, TRIG, CHOLHDL, LDLDIRECT in the last 72 hours. Thyroid Function Tests: No results for input(s): TSH, T4TOTAL, T3FREE, THYROIDAB in the last 72 hours.  Invalid input(s): FREET3 Anemia Panel: No results for input(s): VITAMINB12, FOLATE, FERRITIN, TIBC, IRON, RETICCTPCT in the last 72 hours.  RADIOLOGY: Ct Angio Chest Pe W/cm &/or Wo Cm  05/27/2014   CLINICAL DATA:  Chest pain and shortness of breath  EXAM: CT ANGIOGRAPHY CHEST WITH CONTRAST  TECHNIQUE: Multidetector CT imaging of the chest was performed using the standard protocol during bolus administration of intravenous contrast. Multiplanar CT image reconstructions and MIPs were obtained to evaluate the vascular anatomy.  CONTRAST:  100mL OMNIPAQUE IOHEXOL 350 MG/ML SOLN  COMPARISON:  12/10/2013  FINDINGS: The lungs are well aerated bilaterally. Calcified granulomas are identified consistent with prior granulomatous disease. Mild emphysematous changes are seen. No focal infiltrate or sizable effusion is noted. No sizable parenchymal nodule is seen.  The thoracic aorta is branches show mild calcification. Coronary calcifications and stenting are seen. Pulmonary artery shows a normal branching pattern without evidence of focal filling defect to suggest pulmonary embolism. No hilar or mediastinal adenopathy is seen.  Visualized upper abdomen shows changes of prior cholecystectomy. A right renal cyst is seen. The bony structures show degenerative change of the thoracic spine as well as multiple compression deformities of T5, T6 and T7 as well as T12 which are chronic in nature. Schmorl's nodes are noted at T10 and T11 superiorly which were not present on the prior exam.  Review of the MIP images confirms the above findings.  IMPRESSION: No evidence of pulmonary emboli.  Changes of prior  granulomatous disease.  Multiple old compression deformities. There are new superior endplate Schmorl's nodes noted at T10 and T11. These are of uncertain significance.   Electronically Signed   By: Alcide CleverMark  Lukens M.D.   On: 05/27/2014 14:48   Dg Chest Portable 1 View  05/27/2014   CLINICAL DATA:  LEFT-sided chest pain. Former cigarette smoker. Initial encounter.  EXAM: PORTABLE CHEST - 1 VIEW  COMPARISON:  None.  FINDINGS: Cardiopericardial silhouette within normal limits. Mediastinal contours normal. Trachea midline. No airspace disease or effusion. Monitoring leads project over the chest.  IMPRESSION: No active disease.   Electronically Signed   By: Andreas Newport M.D.   On: 05/27/2014 14:03      ASSESSMENT AND PLAN: 1. Chest pain in setting of CAD with prior PCI: No further episodes of chest pain. Troponins normal. Symptoms were described as similar to prior MI as well as her symptoms of GERD. ECG unremarkable. No ischemia by stress testing in 01/2014, thus no indication to repeat at this time. Continue medical therapy with ASA, Lipitor, metoprolol, and Brilinta. I would recommend discharging patient on Imdur 30 mg bid. 2. Hyperlipidemia: Continue Lipitor. 3. GERD: Continue Protonix 40 mg daily. 4. COPD: Stable and without exacerbation. Continue home meds. 5. Essential HTN: Reasonably controlled at present. Continue lisinopril 5 mg daily. 6. AAA: Stable when last imaged. Repeat imaging in 2016.  Dispo: Stable for discharge from cardiovascular standpoint.   Prentice Docker, M.D., F.A.C.C.

## 2014-06-02 ENCOUNTER — Encounter: Payer: Self-pay | Admitting: Cardiovascular Disease

## 2014-06-23 ENCOUNTER — Other Ambulatory Visit: Payer: Self-pay | Admitting: Cardiology

## 2014-06-23 NOTE — Telephone Encounter (Signed)
Rx(s) sent to pharmacy electronically.  

## 2014-08-10 ENCOUNTER — Other Ambulatory Visit: Payer: Self-pay

## 2014-08-10 MED ORDER — METOPROLOL SUCCINATE ER 50 MG PO TB24: 50.0000 mg | ORAL_TABLET | Freq: Two times a day (BID) | ORAL | Status: DC

## 2014-08-10 MED ORDER — NITROGLYCERIN 0.4 MG SL SUBL: 0.4000 mg | SUBLINGUAL_TABLET | SUBLINGUAL | Status: DC | PRN

## 2014-08-10 NOTE — Telephone Encounter (Signed)
Rx(s) sent to pharmacy electronically.  

## 2014-08-25 ENCOUNTER — Encounter (HOSPITAL_COMMUNITY): Payer: Self-pay

## 2014-08-25 ENCOUNTER — Emergency Department (HOSPITAL_COMMUNITY)
Admission: EM | Admit: 2014-08-25 | Discharge: 2014-08-25 | Disposition: A | Discharge status: Home / Self Care | Payer: BLUE CROSS/BLUE SHIELD | Attending: Emergency Medicine | Admitting: Emergency Medicine

## 2014-08-25 ENCOUNTER — Emergency Department (HOSPITAL_COMMUNITY): Payer: BLUE CROSS/BLUE SHIELD

## 2014-08-25 DIAGNOSIS — I2510 Atherosclerotic heart disease of native coronary artery without angina pectoris: Secondary | ICD-10-CM | POA: Insufficient documentation

## 2014-08-25 DIAGNOSIS — J441 Chronic obstructive pulmonary disease with (acute) exacerbation: Secondary | ICD-10-CM | POA: Diagnosis not present

## 2014-08-25 DIAGNOSIS — R0789 Other chest pain: Secondary | ICD-10-CM | POA: Insufficient documentation

## 2014-08-25 DIAGNOSIS — Z9861 Coronary angioplasty status: Secondary | ICD-10-CM | POA: Diagnosis not present

## 2014-08-25 DIAGNOSIS — I252 Old myocardial infarction: Secondary | ICD-10-CM | POA: Insufficient documentation

## 2014-08-25 DIAGNOSIS — K219 Gastro-esophageal reflux disease without esophagitis: Secondary | ICD-10-CM | POA: Insufficient documentation

## 2014-08-25 DIAGNOSIS — Z87891 Personal history of nicotine dependence: Secondary | ICD-10-CM | POA: Insufficient documentation

## 2014-08-25 DIAGNOSIS — G8929 Other chronic pain: Secondary | ICD-10-CM | POA: Diagnosis not present

## 2014-08-25 DIAGNOSIS — Z7982 Long term (current) use of aspirin: Secondary | ICD-10-CM | POA: Diagnosis not present

## 2014-08-25 DIAGNOSIS — E785 Hyperlipidemia, unspecified: Secondary | ICD-10-CM | POA: Diagnosis not present

## 2014-08-25 DIAGNOSIS — Z880 Allergy status to penicillin: Secondary | ICD-10-CM | POA: Insufficient documentation

## 2014-08-25 DIAGNOSIS — R079 Chest pain, unspecified: Secondary | ICD-10-CM | POA: Diagnosis present

## 2014-08-25 DIAGNOSIS — Z79899 Other long term (current) drug therapy: Secondary | ICD-10-CM | POA: Diagnosis not present

## 2014-08-25 DIAGNOSIS — M1990 Unspecified osteoarthritis, unspecified site: Secondary | ICD-10-CM | POA: Insufficient documentation

## 2014-08-25 DIAGNOSIS — R52 Pain, unspecified: Secondary | ICD-10-CM

## 2014-08-25 LAB — CBC
HCT: 34.4 % — ABNORMAL LOW (ref 36.0–46.0)
HEMOGLOBIN: 11.5 g/dL — AB (ref 12.0–15.0)
MCH: 31.8 pg (ref 26.0–34.0)
MCHC: 33.4 g/dL (ref 30.0–36.0)
MCV: 95 fL (ref 78.0–100.0)
Platelets: 282 10*3/uL (ref 150–400)
RBC: 3.62 MIL/uL — ABNORMAL LOW (ref 3.87–5.11)
RDW: 13.3 % (ref 11.5–15.5)
WBC: 6.5 10*3/uL (ref 4.0–10.5)

## 2014-08-25 LAB — COMPREHENSIVE METABOLIC PANEL
ALT: 16 U/L (ref 0–35)
ANION GAP: 6 (ref 5–15)
AST: 19 U/L (ref 0–37)
Albumin: 3.5 g/dL (ref 3.5–5.2)
Alkaline Phosphatase: 77 U/L (ref 39–117)
BUN: 11 mg/dL (ref 6–23)
CHLORIDE: 103 mmol/L (ref 96–112)
CO2: 25 mmol/L (ref 19–32)
Calcium: 8.4 mg/dL (ref 8.4–10.5)
Creatinine, Ser: 0.88 mg/dL (ref 0.50–1.10)
GFR calc Af Amer: 80 mL/min — ABNORMAL LOW (ref >90)
GFR, EST NON AFRICAN AMERICAN: 69 mL/min — AB (ref >90)
GLUCOSE: 104 mg/dL — AB (ref 70–99)
Potassium: 3.5 mmol/L (ref 3.5–5.1)
Sodium: 134 mmol/L — ABNORMAL LOW (ref 135–145)
Total Bilirubin: 0.7 mg/dL (ref 0.3–1.2)
Total Protein: 6.8 g/dL (ref 6.0–8.3)

## 2014-08-25 LAB — D-DIMER, QUANTITATIVE (NOT AT ARMC): D DIMER QUANT: 1.21 ug{FEU}/mL — AB (ref 0.00–0.48)

## 2014-08-25 LAB — I-STAT TROPONIN, ED: TROPONIN I, POC: 0 ng/mL (ref 0.00–0.08)

## 2014-08-25 MED ORDER — ONDANSETRON HCL 4 MG/2ML IJ SOLN
4.0000 mg | Freq: Once | INTRAMUSCULAR | Status: AC
  Administered 2014-08-25: 4 mg via INTRAVENOUS
  Filled 2014-08-25: qty 2

## 2014-08-25 MED ORDER — HYDROMORPHONE HCL 1 MG/ML IJ SOLN
0.5000 mg | Freq: Once | INTRAMUSCULAR | Status: AC
  Administered 2014-08-25: 0.5 mg via INTRAVENOUS
  Filled 2014-08-25: qty 1

## 2014-08-25 MED ORDER — HYDROCODONE-ACETAMINOPHEN 5-325 MG PO TABS: 1.0000 | ORAL_TABLET | Freq: Four times a day (QID) | ORAL | Status: DC | PRN

## 2014-08-25 MED ORDER — IOHEXOL 350 MG/ML SOLN
100.0000 mL | Freq: Once | INTRAVENOUS | Status: AC | PRN
  Administered 2014-08-25: 100 mL via INTRAVENOUS

## 2014-08-25 MED ORDER — NITROGLYCERIN 2 % TD OINT
1.0000 [in_us] | TOPICAL_OINTMENT | Freq: Once | TRANSDERMAL | Status: AC
  Administered 2014-08-25: 1 [in_us] via TOPICAL
  Filled 2014-08-25: qty 1

## 2014-08-25 MED ORDER — TRAMADOL HCL 50 MG PO TABS: 50.0000 mg | ORAL_TABLET | Freq: Four times a day (QID) | ORAL | Status: DC | PRN

## 2014-08-25 MED ORDER — LORAZEPAM 2 MG/ML IJ SOLN
0.5000 mg | Freq: Once | INTRAMUSCULAR | Status: AC
  Administered 2014-08-25: 0.5 mg via INTRAVENOUS
  Filled 2014-08-25: qty 1

## 2014-08-25 NOTE — ED Notes (Signed)
Pt alert and responsive- smiling and talking with husband and this nurse- BP cuff adjusted

## 2014-08-25 NOTE — ED Notes (Signed)
Pt became nauseated after admin of meds- Dr Estell Harpin informed - verbal order given

## 2014-08-25 NOTE — Discharge Instructions (Signed)
Follow up with your md in 2 days for recheck 

## 2014-08-25 NOTE — ED Provider Notes (Signed)
CSN: [id number]     Arrival date & time 08/25/14  1928 History  This chart was scribed for Bethann Berkshire, MD by Gwenyth Ober, ED Scribe. This patient was seen in room APA05/APA05 and the patient's care was started at 7:48 PM.    Chief Complaint  Patient presents with  . Chest Pain   Patient is a 64 y.o. female presenting with chest pain. The history is provided by the patient. No language interpreter was used.  Chest Pain Pain location:  Substernal area Pain quality: aching   Pain radiates to:  Does not radiate Pain radiates to the back: no   Pain severity:  Moderate Onset quality:  Gradual Duration:  1 hour Timing:  Constant Progression:  Unchanged Chronicity:  New Worsened by:  Deep breathing Ineffective treatments:  Aspirin and nitroglycerin Associated symptoms: shortness of breath   Associated symptoms: no abdominal pain, no back pain, no cough, no fatigue and no headache     HPI Comments: Olivia Wade is a 64 y.o. female, with a history of asthma, who presents to the Emergency Department complaining of constant, moderate chest pain that started 1 hour ago. Pt reports pain becomes worse with deep breaths. She took 4 Baby Aspirin and 2 NTG PTA with no relief. Pt notes baseline SOB that is unchanged today. She reports that her current pain is consistent with a prior MI that occurred 1 year ago.  Past Medical History  Diagnosis Date  . AAA (abdominal aortic aneurysm)   . Tobacco abuse   . HTN (hypertension)   . COPD (chronic obstructive pulmonary disease)   . Asthma   . HLD (hyperlipidemia)   . GERD (gastroesophageal reflux disease)   . Chronic low back pain     (Old vertebral fracture)  . Arthritis   . Coronary artery disease 09/08/13    with PCI  . NSTEMI (non-ST elevated myocardial infarction) 09/08/13  . Heart palpitations     prn toprol for rapid HR  . History of surgery on arm    Past Surgical History  Procedure Laterality Date  . Coronary stent placement   09/08/13    PCI with cutting balloon, PTCA,DES to prox LAD  . Coronary angioplasty with stent placement  09/10/13    PCI of RCA 90% proximal RCA stenosis with DES  . Laparoscopic nissen fundoplication      for severe reflux  . Cholecystectomy    . Abdominal hysterectomy    . 2d echo  12/2012    normal LV with mild LVH, grade 1 diastolic dysfunction  . Doppler echocardiography  12/31/2012  . Nuclear stress test  05/09/2011    NORMAL PATTREN OF PERFUSION IN ALL REGIONS. LV IS NORMAL IS SIZE. EF 68% NO WALL MOTION ABNORMALITIES.  Marland Kitchen Abdominal aortic duplex  05/19/2012    WHEN COMPARED TO PRIOR STUDY DATED 06/01/11 THERE IS AN INCREASE IN SIZE OF DILATATION.  Marland Kitchen Left heart catheterization with coronary angiogram N/A 09/08/2013    Procedure: LEFT HEART CATHETERIZATION WITH CORONARY ANGIOGRAM;  Surgeon: Lennette Bihari, MD;  Location: [facility] CATH LAB;  Service: Cardiovascular;  Laterality: N/A;  . Percutaneous coronary stent intervention (pci-s) N/A 09/10/2013    Procedure: PERCUTANEOUS CORONARY STENT INTERVENTION (PCI-S);  Surgeon: Lennette Bihari, MD;  Location: [facility] CATH LAB;  Service: Cardiovascular;  Laterality: N/A;   Family History  Problem Relation Age of Onset  . Heart attack Father   . Heart attack Son 38  . Cancer Father  Esophageal  . Hypertension Mother    History  Substance Use Topics  . Smoking status: Former Smoker -- 0.50 packs/day for 37 years    Types: Cigarettes    Quit date: 09/08/2013  . Smokeless tobacco: Never Used  . Alcohol Use: No   OB History    No data available     Review of Systems  Constitutional: Negative for appetite change and fatigue.  HENT: Negative for congestion, ear discharge and sinus pressure.   Eyes: Negative for discharge.  Respiratory: Positive for shortness of breath. Negative for cough.   Cardiovascular: Positive for chest pain.  Gastrointestinal: Negative for abdominal pain and diarrhea.  Genitourinary: Negative for frequency and hematuria.   Musculoskeletal: Negative for back pain.  Skin: Negative for rash.  Neurological: Negative for seizures and headaches.  Psychiatric/Behavioral: Negative for hallucinations.   Allergies  Bee venom; Penicillins; Hydrocodone; and Morphine and related  Home Medications   Prior to Admission medications   Medication Sig Start Date End Date Taking? Authorizing Provider  acetaminophen (TYLENOL) 325 MG tablet Take 325 mg by mouth every 6 (six) hours as needed for moderate pain.    Historical Provider, MD  albuterol (PROVENTIL HFA;VENTOLIN HFA) 108 (90 BASE) MCG/ACT inhaler Inhale 1 puff into the lungs every 6 (six) hours as needed for wheezing or shortness of breath.    Historical Provider, MD  albuterol (PROVENTIL) (2.5 MG/3ML) 0.083% nebulizer solution Take 2.5 mg by nebulization every 6 (six) hours as needed for wheezing or shortness of breath.    Historical Provider, MD  ALPRAZolam Prudy Feeler) 0.5 MG tablet Take 0.5 mg by mouth at bedtime. *Patient is prescribed one tablets three times daily as needed for anxiety    Historical Provider, MD  aspirin EC 81 MG tablet Take 81 mg by mouth daily.    Historical Provider, MD  atorvastatin (LIPITOR) 40 MG tablet Take 1 tablet (40 mg total) by mouth daily. 01/07/14   Lennette Bihari, MD  citalopram (CELEXA) 20 MG tablet Take 20 mg by mouth daily.    Historical Provider, MD  cyclobenzaprine (FLEXERIL) 10 MG tablet Take 1 tablet (10 mg total) by mouth 3 (three) times daily as needed for muscle spasms.    Lennette Bihari, MD  isosorbide mononitrate (IMDUR) 30 MG 24 hr tablet Take 1 tablet (30 mg total) by mouth 2 (two) times daily. 05/28/14   Henderson Cloud, MD  lisinopril (PRINIVIL,ZESTRIL) 5 MG tablet Take 1 tablet (5 mg total) by mouth daily. 06/23/14   Lennette Bihari, MD  metoprolol succinate (TOPROL-XL) 50 MG 24 hr tablet Take 1 tablet (50 mg total) by mouth 2 (two) times daily. Take with or immediately following a meal. 08/10/14   Lennette Bihari, MD   montelukast (SINGULAIR) 10 MG tablet Take 10 mg by mouth at bedtime.    Historical Provider, MD  nitroGLYCERIN (NITROSTAT) 0.4 MG SL tablet Place 1 tablet (0.4 mg total) under the tongue every 5 (five) minutes as needed for chest pain. 08/10/14   Lennette Bihari, MD  pantoprazole (PROTONIX) 40 MG tablet Take 40 mg by mouth every morning.    Historical Provider, MD  Ticagrelor (BRILINTA) 90 MG TABS tablet Take 1 tablet (90 mg total) by mouth 2 (two) times daily. 09/11/13   Brittainy Sherlynn Carbon, PA-C  traMADol (ULTRAM) 50 MG tablet Take 2 tablets (100 mg total) by mouth every 6 (six) hours as needed. Patient not taking: Reported on 05/27/2014 01/13/14   Darcella Gasman  [place], NP   BP 106/72 mmHg  Pulse 65  Temp(Src) 97.8 F (36.6 C) (Oral)  Resp 19  Ht  (1.626 m)  Wt 146 lb (66.225 kg)  BMI 25.05 kg/m2  SpO2 98% Physical Exam  Constitutional: She is oriented to person, place, and time. She appears well-developed.  HENT:  Head: Normocephalic.  Eyes: Conjunctivae and EOM are normal. No scleral icterus.  Neck: Neck supple. No thyromegaly present.  Cardiovascular: Normal rate and regular rhythm.  Exam reveals no gallop and no friction rub.   No murmur heard. Pulmonary/Chest: No stridor. She has no wheezes. She has no rales. She exhibits no tenderness.  Abdominal: She exhibits no distension. There is no tenderness. There is no rebound.  Musculoskeletal: Normal range of motion. She exhibits no edema.  Lymphadenopathy:    She has no cervical adenopathy.  Neurological: She is oriented to person, place, and time. She exhibits normal muscle tone. Coordination normal.  Skin: No rash noted. No erythema.  Psychiatric: She has a normal mood and affect. Her behavior is normal.  Nursing note and vitals reviewed.   ED Course  Procedures   DIAGNOSTIC STUDIES: Oxygen Saturation is 98% on RA, normal by my interpretation.    COORDINATION OF CARE: 7:53 PM Discussed treatment plan with pt at bedside and pt  agreed to plan.   Labs Review Labs Reviewed - No data to display  Imaging Review No results found.   EKG Interpretation   Date/Time:  Wednesday August 25 2014 19:32:01 EDT Ventricular Rate:  66 PR Interval:  168 QRS Duration: 95 QT Interval:  441 QTC Calculation: 462 R Axis:   49 Text Interpretation:  Sinus rhythm Confirmed by Augustina Braddock  MD, Jevaughn Degollado [phone])  on 08/25/2014 7:44:14 PM      MDM   Final diagnoses:  None    Noncardiac chest pain,  Pt to follow up with pcp   The chart was scribed for me under my direct supervision.  I personally performed the history, physical, and medical decision making and all procedures in the evaluation of this patient.Bethann Berkshire, MD 08/25/14 [phone]

## 2014-08-25 NOTE — ED Notes (Signed)
Pt was at home at rest when she started having pain to her left chest.  Pt took one of her own nitro at home along with 4 baby aspirin. Pt states she has had some minimal improvement

## 2014-08-25 NOTE — ED Notes (Signed)
Pt and husband given instructions- Pt stated that she has some ultram at the house that she uses for back pain- . No other questions at this time

## 2014-08-25 NOTE — ED Notes (Signed)
lab at bedside --  Pt's husband also at bedside

## 2014-09-13 ENCOUNTER — Telehealth: Payer: Self-pay | Admitting: Cardiovascular Disease

## 2014-09-13 NOTE — Telephone Encounter (Signed)
Dr. Tresa Endo took overhead page concerning this patient. Appointment will be scheduled for him to see on Wednesday.

## 2014-09-14 NOTE — Telephone Encounter (Signed)
agree

## 2014-09-15 ENCOUNTER — Ambulatory Visit: Payer: BLUE CROSS/BLUE SHIELD | Admitting: Cardiovascular Disease

## 2014-09-21 ENCOUNTER — Other Ambulatory Visit: Payer: Self-pay | Admitting: Cardiology

## 2014-09-21 NOTE — Telephone Encounter (Signed)
Rx has been sent to the pharmacy electronically. ° °

## 2014-10-12 ENCOUNTER — Other Ambulatory Visit: Payer: Self-pay

## 2014-10-19 ENCOUNTER — Other Ambulatory Visit: Payer: Self-pay

## 2014-10-19 MED ORDER — PANTOPRAZOLE SODIUM 40 MG PO TBEC: 40.0000 mg | DELAYED_RELEASE_TABLET | Freq: Every morning | ORAL | Status: DC

## 2014-10-19 NOTE — Telephone Encounter (Signed)
Rx(s) sent to pharmacy electronically.  

## 2014-10-20 ENCOUNTER — Other Ambulatory Visit: Payer: Self-pay | Admitting: *Deleted

## 2014-10-20 MED ORDER — ATORVASTATIN CALCIUM 40 MG PO TABS: 40.0000 mg | ORAL_TABLET | Freq: Every day | ORAL | Status: DC

## 2014-10-20 NOTE — Telephone Encounter (Signed)
Rx refill sent to patient pharmacy   

## 2014-10-21 NOTE — Telephone Encounter (Signed)
Defer cyclobenzaprine to pcp

## 2014-10-25 ENCOUNTER — Telehealth: Payer: Self-pay | Admitting: *Deleted

## 2014-10-25 NOTE — Telephone Encounter (Signed)
Called pharmacy to deny patient's generic flexeril. Informed them they will need to contact patient's PCP.

## 2014-10-29 ENCOUNTER — Telehealth: Payer: Self-pay | Admitting: Cardiovascular Disease

## 2014-10-29 NOTE — Telephone Encounter (Signed)
Received a call from [facility] with [facility] radiology.She stated she will ask someone else about electronic signature from [facility].

## 2014-11-01 ENCOUNTER — Telehealth: Payer: Self-pay | Admitting: Cardiovascular Disease

## 2014-11-01 ENCOUNTER — Ambulatory Visit (HOSPITAL_COMMUNITY)
Admission: RE | Admit: 2014-11-01 | Discharge: 2014-11-01 | Disposition: A | Discharge status: Home / Self Care | Payer: BLUE CROSS/BLUE SHIELD | Source: Ambulatory Visit | Attending: Cardiovascular Disease | Admitting: Cardiovascular Disease

## 2014-11-01 ENCOUNTER — Other Ambulatory Visit: Payer: Self-pay | Admitting: *Deleted

## 2014-11-01 ENCOUNTER — Ambulatory Visit (HOSPITAL_COMMUNITY): Payer: BLUE CROSS/BLUE SHIELD

## 2014-11-01 DIAGNOSIS — Z9889 Other specified postprocedural states: Secondary | ICD-10-CM | POA: Diagnosis not present

## 2014-11-01 DIAGNOSIS — I714 Abdominal aortic aneurysm, without rupture, unspecified: Secondary | ICD-10-CM

## 2014-11-01 NOTE — Telephone Encounter (Signed)
Dee called from [facility] - needed AAA duplex order. At review of pt chart, noted future order in system, released this so that it is viewable. Informed Dee, she voiced acknowledgement.

## 2014-11-05 ENCOUNTER — Telehealth: Payer: Self-pay | Admitting: *Deleted

## 2014-11-05 NOTE — Telephone Encounter (Signed)
-----   Message from Lennette Bihari, MD sent at 11/03/2014  7:54 AM EDT ----- Mild increase in AAA now 4.1 x 4.2; re-check in 6 mo

## 2014-11-05 NOTE — Telephone Encounter (Signed)
Patient notified of results and recommendations. She voiced understanding.

## 2014-11-25 ENCOUNTER — Other Ambulatory Visit: Payer: Self-pay

## 2014-11-25 MED ORDER — PANTOPRAZOLE SODIUM 40 MG PO TBEC: 40.0000 mg | DELAYED_RELEASE_TABLET | Freq: Every morning | ORAL | Status: DC

## 2014-11-25 NOTE — Telephone Encounter (Signed)
Rx(s) sent to pharmacy electronically.  

## 2015-01-04 ENCOUNTER — Other Ambulatory Visit: Payer: Self-pay | Admitting: *Deleted

## 2015-01-04 MED ORDER — NITROGLYCERIN 0.4 MG SL SUBL: 0.4000 mg | SUBLINGUAL_TABLET | SUBLINGUAL | Status: DC | PRN

## 2015-03-30 ENCOUNTER — Other Ambulatory Visit: Payer: Self-pay | Admitting: Cardiovascular Disease

## 2015-04-01 ENCOUNTER — Emergency Department (HOSPITAL_COMMUNITY)
Admission: EM | Admit: 2015-04-01 | Discharge: 2015-04-02 | Discharge status: Against Medical Advice | Payer: BLUE CROSS/BLUE SHIELD | Attending: Emergency Medicine | Admitting: Emergency Medicine

## 2015-04-01 ENCOUNTER — Emergency Department (HOSPITAL_COMMUNITY): Payer: BLUE CROSS/BLUE SHIELD

## 2015-04-01 ENCOUNTER — Encounter (HOSPITAL_COMMUNITY): Payer: Self-pay | Admitting: *Deleted

## 2015-04-01 DIAGNOSIS — J441 Chronic obstructive pulmonary disease with (acute) exacerbation: Secondary | ICD-10-CM | POA: Diagnosis not present

## 2015-04-01 DIAGNOSIS — R110 Nausea: Secondary | ICD-10-CM | POA: Diagnosis not present

## 2015-04-01 DIAGNOSIS — M1990 Unspecified osteoarthritis, unspecified site: Secondary | ICD-10-CM | POA: Diagnosis not present

## 2015-04-01 DIAGNOSIS — I10 Essential (primary) hypertension: Secondary | ICD-10-CM | POA: Insufficient documentation

## 2015-04-01 DIAGNOSIS — E785 Hyperlipidemia, unspecified: Secondary | ICD-10-CM | POA: Insufficient documentation

## 2015-04-01 DIAGNOSIS — I2510 Atherosclerotic heart disease of native coronary artery without angina pectoris: Secondary | ICD-10-CM | POA: Diagnosis not present

## 2015-04-01 DIAGNOSIS — R42 Dizziness and giddiness: Secondary | ICD-10-CM | POA: Insufficient documentation

## 2015-04-01 DIAGNOSIS — G8929 Other chronic pain: Secondary | ICD-10-CM | POA: Insufficient documentation

## 2015-04-01 DIAGNOSIS — R079 Chest pain, unspecified: Secondary | ICD-10-CM | POA: Diagnosis present

## 2015-04-01 DIAGNOSIS — Z880 Allergy status to penicillin: Secondary | ICD-10-CM | POA: Insufficient documentation

## 2015-04-01 DIAGNOSIS — Z79899 Other long term (current) drug therapy: Secondary | ICD-10-CM | POA: Insufficient documentation

## 2015-04-01 DIAGNOSIS — Z8781 Personal history of (healed) traumatic fracture: Secondary | ICD-10-CM | POA: Diagnosis not present

## 2015-04-01 DIAGNOSIS — Z87891 Personal history of nicotine dependence: Secondary | ICD-10-CM | POA: Diagnosis not present

## 2015-04-01 DIAGNOSIS — Z7982 Long term (current) use of aspirin: Secondary | ICD-10-CM | POA: Insufficient documentation

## 2015-04-01 DIAGNOSIS — I252 Old myocardial infarction: Secondary | ICD-10-CM | POA: Insufficient documentation

## 2015-04-01 DIAGNOSIS — K219 Gastro-esophageal reflux disease without esophagitis: Secondary | ICD-10-CM | POA: Insufficient documentation

## 2015-04-01 LAB — BASIC METABOLIC PANEL
ANION GAP: 8 (ref 5–15)
BUN: 13 mg/dL (ref 6–20)
CALCIUM: 8.7 mg/dL — AB (ref 8.9–10.3)
CHLORIDE: 105 mmol/L (ref 101–111)
CO2: 23 mmol/L (ref 22–32)
Creatinine, Ser: 1.04 mg/dL — ABNORMAL HIGH (ref 0.44–1.00)
GFR calc Af Amer: >60 mL/min (ref >60)
GFR calc non Af Amer: 56 mL/min — ABNORMAL LOW (ref >60)
GLUCOSE: 95 mg/dL (ref 65–99)
Potassium: 3.7 mmol/L (ref 3.5–5.1)
Sodium: 136 mmol/L (ref 135–145)

## 2015-04-01 LAB — CBC
HCT: 33.5 % — ABNORMAL LOW (ref 36.0–46.0)
HEMOGLOBIN: 11.2 g/dL — AB (ref 12.0–15.0)
MCH: 32.1 pg (ref 26.0–34.0)
MCHC: 33.4 g/dL (ref 30.0–36.0)
MCV: 96 fL (ref 78.0–100.0)
Platelets: 262 10*3/uL (ref 150–400)
RBC: 3.49 MIL/uL — AB (ref 3.87–5.11)
RDW: 12.8 % (ref 11.5–15.5)
WBC: 8.1 10*3/uL (ref 4.0–10.5)

## 2015-04-01 LAB — TROPONIN I

## 2015-04-01 NOTE — ED Provider Notes (Signed)
TIME SEEN: 11:50 PM  CHIEF COMPLAINT: Chest pain  HPI: Pt is a 64 y.o. female with a history of a AAA, hypertension, COPD, hyperlipidemia, NSTEMI in 2015 status post PCI who presents to the emergency department with complaints of chest pain. She describes the pain as a creepy pain elicited of her chest without radiation. She took one nitroglycerin and 4 baby aspirins at home and now her pain is gone. She did have associated shortness of breath, nausea and lightheadedness. No diaphoresis. No back or abdominal pain. States this does not feel like her prior MI that she describes as feeling she was "on fire". States this occurred while she was sitting on the couch today. Her cardiologist is Dr. Tresa Endo. PCP is Dr. Loney Hering.  States her last stress test was over a year ago. Reports her chest pain is completely gone now. States she is feeling much better. She does report that she has chest pain usually every 2-3 weeks but it normally resolves without nitroglycerin. Tonight she was concerned because it took one nitroglycerin to get her chest pain to go away and this is abnormal for her.  ROS: See HPI Constitutional: no fever  Eyes: no drainage  ENT: no runny nose   Cardiovascular:   chest pain  Resp:  SOB  GI: no vomiting GU: no dysuria Integumentary: no rash  Allergy: no hives  Musculoskeletal: no leg swelling  Neurological: no slurred speech ROS otherwise negative  PAST MEDICAL HISTORY/PAST SURGICAL HISTORY:  Past Medical History  Diagnosis Date  . AAA (abdominal aortic aneurysm) (HCC)   . Tobacco abuse   . HTN (hypertension)   . COPD (chronic obstructive pulmonary disease) (HCC)   . Asthma   . HLD (hyperlipidemia)   . GERD (gastroesophageal reflux disease)   . Chronic low back pain     (Old vertebral fracture)  . Arthritis   . Coronary artery disease 09/08/13    with PCI  . NSTEMI (non-ST elevated myocardial infarction) (HCC) 09/08/13  . Heart palpitations     prn toprol for rapid HR  .  History of surgery on arm     MEDICATIONS:  Prior to Admission medications   Medication Sig Start Date End Date Taking? Authorizing Provider  albuterol (PROVENTIL HFA;VENTOLIN HFA) 108 (90 BASE) MCG/ACT inhaler Inhale 1 puff into the lungs every 6 (six) hours as needed for wheezing or shortness of breath.   Yes Historical Provider, MD  albuterol (PROVENTIL) (2.5 MG/3ML) 0.083% nebulizer solution Take 2.5 mg by nebulization every 6 (six) hours as needed for wheezing or shortness of breath.   Yes Historical Provider, MD  ALPRAZolam Prudy Feeler) 0.5 MG tablet Take 0.5 mg by mouth 3 (three) times daily as needed for anxiety or sleep. *Patient is prescribed one tablets three times daily as needed for anxiety   Yes Historical Provider, MD  aspirin EC 81 MG tablet Take 81 mg by mouth daily.   Yes Historical Provider, MD  atorvastatin (LIPITOR) 40 MG tablet take 1 tablet by mouth once daily 03/30/15  Yes Lennette Bihari, MD  BRILINTA 90 MG TABS tablet take 1 tablet by mouth twice a day 09/21/14  Yes Lennette Bihari, MD  citalopram (CELEXA) 20 MG tablet Take 20 mg by mouth daily.   Yes Historical Provider, MD  cyclobenzaprine (FLEXERIL) 10 MG tablet Take 1 tablet (10 mg total) by mouth 3 (three) times daily as needed for muscle spasms.   Yes Lennette Bihari, MD  isosorbide mononitrate (IMDUR) 30 MG  24 hr tablet Take 1 tablet (30 mg total) by mouth 2 (two) times daily. Patient taking differently: Take 30 mg by mouth daily.  05/28/14  Yes Henderson Cloud, MD  lisinopril (PRINIVIL,ZESTRIL) 5 MG tablet Take 1 tablet (5 mg total) by mouth daily. 06/23/14  Yes Lennette Bihari, MD  MELATONIN PO Take 1 tablet by mouth daily as needed (sleep).   Yes Historical Provider, MD  metoprolol succinate (TOPROL-XL) 50 MG 24 hr tablet Take 1 tablet (50 mg total) by mouth 2 (two) times daily. Take with or immediately following a meal. 08/10/14  Yes Lennette Bihari, MD  montelukast (SINGULAIR) 10 MG tablet Take 10 mg by mouth at  bedtime.   Yes Historical Provider, MD  nitroGLYCERIN (NITROSTAT) 0.4 MG SL tablet Place 1 tablet (0.4 mg total) under the tongue every 5 (five) minutes as needed for chest pain. NEED OV. 01/04/15  Yes Lennette Bihari, MD  pantoprazole (PROTONIX) 40 MG tablet Take 1 tablet (40 mg total) by mouth every morning. 11/25/14  Yes Lennette Bihari, MD  acetaminophen (TYLENOL) 325 MG tablet Take 325 mg by mouth every 6 (six) hours as needed for moderate pain.    Historical Provider, MD    ALLERGIES:  Allergies  Allergen Reactions  . Bee Venom Anaphylaxis, Hives and Swelling  . Penicillins Other (See Comments)    PATIENT REPORTS "MOTHER TOLD HER SHE HAD ALLERGY TO PCN"  . Hydrocodone Other (See Comments)  . Morphine And Related Rash    SOCIAL HISTORY:  Social History  Substance Use Topics  . Smoking status: Former Smoker -- 0.50 packs/day for 37 years    Types: Cigarettes    Quit date: 09/08/2013  . Smokeless tobacco: Never Used  . Alcohol Use: No    FAMILY HISTORY: Family History  Problem Relation Age of Onset  . Heart attack Father   . Heart attack Son 40  . Cancer Father     Esophageal  . Hypertension Mother     EXAM: BP 120/71 mmHg  Pulse 61  Temp(Src) 97.6 F (36.4 C) (Oral)  Resp 16  Ht 5\' 3"  (1.6 m)  Wt 140 lb (63.504 kg)  BMI 24.81 kg/m2  SpO2 100% CONSTITUTIONAL: Alert and oriented and responds appropriately to questions. Well-appearing; well-nourished HEAD: Normocephalic EYES: Conjunctivae clear, PERRL ENT: normal nose; no rhinorrhea; moist mucous membranes; pharynx without lesions noted NECK: Supple, no meningismus, no LAD  CARD: RRR; S1 and S2 appreciated; no murmurs, no clicks, no rubs, no gallops RESP: Normal chest excursion without splinting or tachypnea; breath sounds clear and equal bilaterally; no wheezes, no rhonchi, no rales, no hypoxia or respiratory distress, speaking full sentences ABD/GI: Normal bowel sounds; non-distended; soft, non-tender, no rebound,  no guarding, no peritoneal signs BACK:  The back appears normal and is non-tender to palpation, there is no CVA tenderness EXT: Normal ROM in all joints; non-tender to palpation; no edema; normal capillary refill; no cyanosis, no calf tenderness or swelling    SKIN: Normal color for age and race; warm NEURO: Moves all extremities equally, sensation to light touch intact diffusely, cranial nerves II through XII intact PSYCH: The patient's mood and manner are appropriate. Grooming and personal hygiene are appropriate.  MEDICAL DECISION MAKING: Patient here with chest pain with multiple risk factors for ACS. First troponin negative. EKG shows no significant changes. Chest x-ray clear. Have recommended patient stay in the emergency department for a second troponin although her chest pain does sound like  stable angina. At this time she refuses to stay any longer and would like to go home given she is feeling much better. I discussed at length with her that she could be having a heart attack and have recommend she stay for the second blood test but she would like to go home and states she understands the risks of return of symptoms, morbidity, disability and death. I feel she has the capacity to make this decision. She is not intoxicated. Have discussed with her return precautions. Have recommend close outpatient follow-up. She reports she will call her cardiologist Dr. Tresa Endo in 2 days. Patient and husband verbalize understanding. Husband at bedside during this conversation as well. We'll have her sign out AGAINST MEDICAL ADVICE.      EKG Interpretation  Date/Time:  Friday April 01 2015 21:33:40 EDT Ventricular Rate:  62 PR Interval:  170 QRS Duration: 100 QT Interval:  445 QTC Calculation: 452 R Axis:   43 Text Interpretation:  Sinus rhythm Abnormal R-wave progression, early transition No significant change since last tracing Confirmed by Tamar Lipscomb,  DO, Sheng Pritz [phone]) on 04/01/2015 11:12:12 PM         Olivia Maw Auriella Wieand, DO 04/02/15 0159

## 2015-04-01 NOTE — ED Notes (Signed)
Pt c/o left side chest burning, sharpness that radiates to left arm that is associated with sob, dizziness, lightheadedness, nausea that started this evening around 8 pm, pt took 4 81 mg asa prior to ems arrival, was given one nitro en route with some relief in the intensity of the pain,

## 2015-04-02 NOTE — Discharge Instructions (Signed)
Nonspecific Chest Pain  °Chest pain can be caused by many different conditions. There is always a chance that your pain could be related to something serious, such as a heart attack or a blood clot in your lungs. Chest pain can also be caused by conditions that are not life-threatening. If you have chest pain, it is very important to follow up with your health care provider. °CAUSES  °Chest pain can be caused by: °· Heartburn. °· Pneumonia or bronchitis. °· Anxiety or stress. °· Inflammation around your heart (pericarditis) or lung (pleuritis or pleurisy). °· A blood clot in your lung. °· A collapsed lung (pneumothorax). It can develop suddenly on its own (spontaneous pneumothorax) or from trauma to the chest. °· Shingles infection (varicella-zoster virus). °· Heart attack. °· Damage to the bones, muscles, and cartilage that make up your chest wall. This can include: °¨ Bruised bones due to injury. °¨ Strained muscles or cartilage due to frequent or repeated coughing or overwork. °¨ Fracture to one or more ribs. °¨ Sore cartilage due to inflammation (costochondritis). °RISK FACTORS  °Risk factors for chest pain may include: °· Activities that increase your risk for trauma or injury to your chest. °· Respiratory infections or conditions that cause frequent coughing. °· Medical conditions or overeating that can cause heartburn. °· Heart disease or family history of heart disease. °· Conditions or health behaviors that increase your risk of developing a blood clot. °· Having had chicken pox (varicella zoster). °SIGNS AND SYMPTOMS °Chest pain can feel like: °· Burning or tingling on the surface of your chest or deep in your chest. °· Crushing, pressure, aching, or squeezing pain. °· Dull or sharp pain that is worse when you move, cough, or take a deep breath. °· Pain that is also felt in your back, neck, shoulder, or arm, or pain that spreads to any of these areas. °Your chest pain may come and go, or it may stay  constant. °DIAGNOSIS °Lab tests or other studies may be needed to find the cause of your pain. Your health care provider may have you take a test called an ambulatory ECG (electrocardiogram). An ECG records your heartbeat patterns at the time the test is performed. You may also have other tests, such as: °· Transthoracic echocardiogram (TTE). During echocardiography, sound waves are used to create a picture of all of the heart structures and to look at how blood flows through your heart. °· Transesophageal echocardiogram (TEE). This is a more advanced imaging test that obtains images from inside your body. It allows your health care provider to see your heart in finer detail. °· Cardiac monitoring. This allows your health care provider to monitor your heart rate and rhythm in real time. °· Holter monitor. This is a portable device that records your heartbeat and can help to diagnose abnormal heartbeats. It allows your health care provider to track your heart activity for several days, if needed. °· Stress tests. These can be done through exercise or by taking medicine that makes your heart beat more quickly. °· Blood tests. °· Imaging tests. °TREATMENT  °Your treatment depends on what is causing your chest pain. Treatment may include: °· Medicines. These may include: °¨ Acid blockers for heartburn. °¨ Anti-inflammatory medicine. °¨ Pain medicine for inflammatory conditions. °¨ Antibiotic medicine, if an infection is present. °¨ Medicines to dissolve blood clots. °¨ Medicines to treat coronary artery disease. °· Supportive care for conditions that do not require medicines. This may include: °¨ Resting. °¨ Applying heat   or cold packs to injured areas.  Limiting activities until pain decreases. HOME CARE INSTRUCTIONS  If you were prescribed an antibiotic medicine, finish it all even if you start to feel better.  Avoid any activities that bring on chest pain.  Do not use any tobacco products, including  cigarettes, chewing tobacco, or electronic cigarettes. If you need help quitting, ask your health care provider.  Do not drink alcohol.  Take medicines only as directed by your health care provider.  Keep all follow-up visits as directed by your health care provider. This is important. This includes any further testing if your chest pain does not go away.  If heartburn is the cause for your chest pain, you may be told to keep your head raised (elevated) while sleeping. This reduces the chance that acid will go from your stomach into your esophagus.  Make lifestyle changes as directed by your health care provider. These may include:  Getting regular exercise. Ask your health care provider to suggest some activities that are safe for you.  Eating a heart-healthy diet. A registered dietitian can help you to learn healthy eating options.  Maintaining a healthy weight.  Managing diabetes, if necessary.  Reducing stress. SEEK MEDICAL CARE IF:  Your chest pain does not go away after treatment.  You have a rash with blisters on your chest.  You have a fever. SEEK IMMEDIATE MEDICAL CARE IF:   Your chest pain is worse.  You have an increasing cough, or you cough up blood.  You have severe abdominal pain.  You have severe weakness.  You faint.  You have chills.  You have sudden, unexplained chest discomfort.  You have sudden, unexplained discomfort in your arms, back, neck, or jaw.  You have shortness of breath at any time.  You suddenly start to sweat, or your skin gets clammy.  You feel nauseous or you vomit.  You suddenly feel light-headed or dizzy.  Your heart begins to beat quickly, or it feels like it is skipping beats. These symptoms may represent a serious problem that is an emergency. Do not wait to see if the symptoms will go away. Get medical help right away. Call your local emergency services (911 in the [place] Do not drive yourself to the hospital.   This  information is not intended to replace advice given to you by your health care provider. Make sure you discuss any questions you have with your health care provider.   Document Released: 03/07/2005 Document Revised: 06/18/2014 Document Reviewed: 01/01/2014 Elsevier Interactive Patient Education 2016 [employer].     Acknowledgement of Risk of Discharge  Against Medical Advice   And Release of Liability    Because I am choosing to leave the hospital in spite of these risks, I release the hospital, its employees and officers, and my attending physician from all liability for any adverse results caused by my leaving the hospital prematurely.   ________________________________      _____________________________________ Patient's Signature                                        Date and Time   ________________________________      _____________________________________ Witness' Signature  Relationship to Patient    ________________________________      _____________________________________ Witness' Signature                                        Relationship to Patient   [If patient refuses to sign, write "Patient refused to sign" on the patient's signature line and have witnesses to the refusal sign as witnesses.]

## 2015-04-02 NOTE — ED Notes (Signed)
Pt refusing to stay and have another troponin drawn. Pt signed out AMA

## 2015-04-04 ENCOUNTER — Emergency Department (HOSPITAL_COMMUNITY)
Admission: EM | Admit: 2015-04-04 | Discharge: 2015-04-04 | Disposition: A | Discharge status: Home / Self Care | Payer: BLUE CROSS/BLUE SHIELD | Attending: Emergency Medicine | Admitting: Emergency Medicine

## 2015-04-04 ENCOUNTER — Encounter (HOSPITAL_COMMUNITY): Payer: Self-pay | Admitting: Cardiology

## 2015-04-04 ENCOUNTER — Emergency Department (HOSPITAL_COMMUNITY): Payer: BLUE CROSS/BLUE SHIELD

## 2015-04-04 DIAGNOSIS — Z87891 Personal history of nicotine dependence: Secondary | ICD-10-CM | POA: Insufficient documentation

## 2015-04-04 DIAGNOSIS — Z880 Allergy status to penicillin: Secondary | ICD-10-CM | POA: Diagnosis not present

## 2015-04-04 DIAGNOSIS — G8929 Other chronic pain: Secondary | ICD-10-CM | POA: Insufficient documentation

## 2015-04-04 DIAGNOSIS — K219 Gastro-esophageal reflux disease without esophagitis: Secondary | ICD-10-CM | POA: Diagnosis not present

## 2015-04-04 DIAGNOSIS — Z7982 Long term (current) use of aspirin: Secondary | ICD-10-CM | POA: Diagnosis not present

## 2015-04-04 DIAGNOSIS — I252 Old myocardial infarction: Secondary | ICD-10-CM | POA: Diagnosis not present

## 2015-04-04 DIAGNOSIS — E785 Hyperlipidemia, unspecified: Secondary | ICD-10-CM | POA: Insufficient documentation

## 2015-04-04 DIAGNOSIS — R079 Chest pain, unspecified: Secondary | ICD-10-CM | POA: Insufficient documentation

## 2015-04-04 DIAGNOSIS — I2510 Atherosclerotic heart disease of native coronary artery without angina pectoris: Secondary | ICD-10-CM | POA: Insufficient documentation

## 2015-04-04 DIAGNOSIS — I10 Essential (primary) hypertension: Secondary | ICD-10-CM | POA: Diagnosis not present

## 2015-04-04 DIAGNOSIS — J449 Chronic obstructive pulmonary disease, unspecified: Secondary | ICD-10-CM | POA: Diagnosis not present

## 2015-04-04 DIAGNOSIS — Z79899 Other long term (current) drug therapy: Secondary | ICD-10-CM | POA: Insufficient documentation

## 2015-04-04 LAB — BASIC METABOLIC PANEL
ANION GAP: 8 (ref 5–15)
BUN: 7 mg/dL (ref 6–20)
CALCIUM: 9 mg/dL (ref 8.9–10.3)
CO2: 20 mmol/L — AB (ref 22–32)
CREATININE: 0.99 mg/dL (ref 0.44–1.00)
Chloride: 108 mmol/L (ref 101–111)
GFR calc Af Amer: >60 mL/min (ref >60)
GFR, EST NON AFRICAN AMERICAN: 59 mL/min — AB (ref >60)
Glucose, Bld: 95 mg/dL (ref 65–99)
Potassium: 4.2 mmol/L (ref 3.5–5.1)
Sodium: 136 mmol/L (ref 135–145)

## 2015-04-04 LAB — CBC
HCT: 36.5 % (ref 36.0–46.0)
HEMOGLOBIN: 12 g/dL (ref 12.0–15.0)
MCH: 31.8 pg (ref 26.0–34.0)
MCHC: 32.9 g/dL (ref 30.0–36.0)
MCV: 96.8 fL (ref 78.0–100.0)
PLATELETS: 297 10*3/uL (ref 150–400)
RBC: 3.77 MIL/uL — ABNORMAL LOW (ref 3.87–5.11)
RDW: 13.1 % (ref 11.5–15.5)
WBC: 6.3 10*3/uL (ref 4.0–10.5)

## 2015-04-04 LAB — I-STAT TROPONIN, ED
TROPONIN I, POC: 0 ng/mL (ref 0.00–0.08)
Troponin i, poc: 0 ng/mL (ref 0.00–0.08)

## 2015-04-04 LAB — PROTIME-INR
INR: 1.11 (ref 0.00–1.49)
Prothrombin Time: 14.5 seconds (ref 11.6–15.2)

## 2015-04-04 NOTE — ED Provider Notes (Signed)
CSN: [id number]     Arrival date & time 04/04/15  1153 History   First MD Initiated Contact with Patient 04/04/15 1321     Chief Complaint  Patient presents with  . Chest Pain   HPI  [name] is a 64 year old female with PMHx of AAA, HTN, COPD, CAD with stenting of LAD and RCA and NSTEMI presenting with chest pain. Pt states that the chest pain started approximately 2 hours ago while she was sitting on her couch. She describes it as burning and pressure that radiates into her back and down her left arm. She states this pain is typical of her chest pain visits in the past. Associated symptoms include increased dyspnea and nausea. She has taken 2 nitro and 3 baby aspirin with improvement but not resolution of symptoms. Pain is not exacerbated by exertion. Denies fevers, chills, headache, dizziness, lightheadedness, palpitations, abdominal pain, vomiting, back pain, weakness/numbness in extremities or syncope. Pt was seen in ED for similar complaints 2 days ago but refused to stay for 2nd troponin and left AMA. Pt was supposed to follow up with cardiology but has not called her cardiologist (Dr. Tresa Endo) yet. She had an NSTEMI in March 2015 and had PCI with LAD and RCA stenting. She was hospitalized for chest pain rule out in August 2015 and had lexiscan that was low risk with no ischemic changes noted. She also had echo at this time with 70% EF.   Past Medical History  Diagnosis Date  . AAA (abdominal aortic aneurysm) (HCC)   . Tobacco abuse   . HTN (hypertension)   . COPD (chronic obstructive pulmonary disease) (HCC)   . Asthma   . HLD (hyperlipidemia)   . GERD (gastroesophageal reflux disease)   . Chronic low back pain     (Old vertebral fracture)  . Arthritis   . Coronary artery disease 09/08/13    with PCI  . NSTEMI (non-ST elevated myocardial infarction) (HCC) 09/08/13  . Heart palpitations     prn toprol for rapid HR  . History of surgery on arm    Past Surgical History  Procedure  Laterality Date  . Coronary stent placement  09/08/13    PCI with cutting balloon, PTCA,DES to prox LAD  . Coronary angioplasty with stent placement  09/10/13    PCI of RCA 90% proximal RCA stenosis with DES  . Laparoscopic nissen fundoplication      for severe reflux  . Cholecystectomy    . Abdominal hysterectomy    . 2d echo  12/2012    normal LV with mild LVH, grade 1 diastolic dysfunction  . Doppler echocardiography  12/31/2012  . Nuclear stress test  05/09/2011    NORMAL PATTREN OF PERFUSION IN ALL REGIONS. LV IS NORMAL IS SIZE. EF 68% NO WALL MOTION ABNORMALITIES.  Marland Kitchen Abdominal aortic duplex  05/19/2012    WHEN COMPARED TO PRIOR STUDY DATED 06/01/11 THERE IS AN INCREASE IN SIZE OF DILATATION.  Marland Kitchen Left heart catheterization with coronary angiogram N/A 09/08/2013    Procedure: LEFT HEART CATHETERIZATION WITH CORONARY ANGIOGRAM;  Surgeon: Lennette Bihari, MD;  Location: [facility] CATH LAB;  Service: Cardiovascular;  Laterality: N/A;  . Percutaneous coronary stent intervention (pci-s) N/A 09/10/2013    Procedure: PERCUTANEOUS CORONARY STENT INTERVENTION (PCI-S);  Surgeon: Lennette Bihari, MD;  Location: [facility] CATH LAB;  Service: Cardiovascular;  Laterality: N/A;   Family History  Problem Relation Age of Onset  . Heart attack Father   . Heart  attack Son 87  . Cancer Father     Esophageal  . Hypertension Mother    Social History  Substance Use Topics  . Smoking status: Former Smoker -- 0.50 packs/day for 37 years    Types: Cigarettes    Quit date: 09/08/2013  . Smokeless tobacco: Never Used  . Alcohol Use: No   OB History    No data available     Review of Systems  Constitutional: Negative for fever and chills.  Eyes: Negative for visual disturbance.  Respiratory: Positive for shortness of breath. Negative for cough and wheezing.   Cardiovascular: Positive for chest pain. Negative for palpitations and leg swelling.  Gastrointestinal: Negative for nausea, vomiting and abdominal pain.   Musculoskeletal: Negative for back pain and neck pain.  Skin: Negative for rash.  Neurological: Negative for dizziness, syncope, weakness, light-headedness, numbness and headaches.  All other systems reviewed and are negative.     Allergies  Bee venom; Penicillins; Hydrocodone; and Morphine and related  Home Medications   Prior to Admission medications   Medication Sig Start Date End Date Taking? Authorizing Provider  acetaminophen (TYLENOL) 325 MG tablet Take 325 mg by mouth every 6 (six) hours as needed for moderate pain.   Yes Historical Provider, MD  albuterol (PROVENTIL HFA;VENTOLIN HFA) 108 (90 BASE) MCG/ACT inhaler Inhale 1 puff into the lungs every 6 (six) hours as needed for wheezing or shortness of breath.   Yes Historical Provider, MD  albuterol (PROVENTIL) (2.5 MG/3ML) 0.083% nebulizer solution Take 2.5 mg by nebulization every 6 (six) hours as needed for wheezing or shortness of breath.   Yes Historical Provider, MD  ALPRAZolam Prudy Feeler) 0.5 MG tablet Take 0.5 mg by mouth 3 (three) times daily as needed for anxiety or sleep. *Patient is prescribed one tablets three times daily as needed for anxiety   Yes Historical Provider, MD  aspirin EC 81 MG tablet Take 81 mg by mouth daily.   Yes Historical Provider, MD  atorvastatin (LIPITOR) 40 MG tablet take 1 tablet by mouth once daily 03/30/15  Yes Lennette Bihari, MD  BRILINTA 90 MG TABS tablet take 1 tablet by mouth twice a day 09/21/14  Yes Lennette Bihari, MD  citalopram (CELEXA) 20 MG tablet Take 20 mg by mouth daily.   Yes Historical Provider, MD  cyclobenzaprine (FLEXERIL) 10 MG tablet Take 1 tablet (10 mg total) by mouth 3 (three) times daily as needed for muscle spasms.   Yes Lennette Bihari, MD  isosorbide mononitrate (IMDUR) 30 MG 24 hr tablet Take 1 tablet (30 mg total) by mouth 2 (two) times daily. Patient taking differently: Take 30 mg by mouth daily.  05/28/14  Yes Henderson Cloud, MD  lisinopril  (PRINIVIL,ZESTRIL) 5 MG tablet Take 1 tablet (5 mg total) by mouth daily. 06/23/14  Yes Lennette Bihari, MD  MELATONIN PO Take 1 tablet by mouth daily as needed (sleep).   Yes Historical Provider, MD  metoprolol succinate (TOPROL-XL) 50 MG 24 hr tablet Take 1 tablet (50 mg total) by mouth 2 (two) times daily. Take with or immediately following a meal. 08/10/14  Yes Lennette Bihari, MD  montelukast (SINGULAIR) 10 MG tablet Take 10 mg by mouth at bedtime.   Yes Historical Provider, MD  nitroGLYCERIN (NITROSTAT) 0.4 MG SL tablet Place 1 tablet (0.4 mg total) under the tongue every 5 (five) minutes as needed for chest pain. NEED OV. 01/04/15  Yes Lennette Bihari, MD  pantoprazole (PROTONIX) 40  MG tablet Take 1 tablet (40 mg total) by mouth every morning. 11/25/14  Yes Lennette Bihari, MD   BP 124/66 mmHg  Pulse 80  Temp(Src) 98.1 F (36.7 C) (Oral)  Resp 18  SpO2 95% Physical Exam  Constitutional: She is oriented to person, place, and time. She appears well-developed and well-nourished. No distress.  HENT:  Head: Normocephalic and atraumatic.  Mouth/Throat: Oropharynx is clear and moist.  Eyes: Conjunctivae and EOM are normal. Pupils are equal, round, and reactive to light. Right eye exhibits no discharge. Left eye exhibits no discharge. No scleral icterus.  Neck: Normal range of motion.  Cardiovascular: Normal rate, regular rhythm, normal heart sounds and intact distal pulses.   No murmur heard. Pedal pulses palpable  Pulmonary/Chest: Effort normal and breath sounds normal. No respiratory distress. She has no wheezes. She has no rales. She exhibits no tenderness.  Abdominal: Soft. She exhibits no distension. There is no tenderness. There is no rebound and no guarding.  Musculoskeletal: Normal range of motion.  Moves all extremities spontaneously  Neurological: She is alert and oriented to person, place, and time. No cranial nerve deficit. Coordination normal.  Cranial nerves 3-12 intact. 5/5 strength  of major muscle groups. Sensation to light touch intact. Walks with a steady gait.   Skin: Skin is warm and dry.  Psychiatric: She has a normal mood and affect. Her behavior is normal.  Nursing note and vitals reviewed.   ED Course  Procedures (including critical care time) Labs Review Labs Reviewed  BASIC METABOLIC PANEL - Abnormal; Notable for the following:    CO2 20 (*)    GFR calc non Af Amer 59 (*)    All other components within normal limits  CBC - Abnormal; Notable for the following:    RBC 3.77 (*)    All other components within normal limits  PROTIME-INR  I-STAT TROPOININ, ED  Rosezena Sensor, ED    Imaging Review Dg Chest 2 View  04/04/2015  CLINICAL DATA:  Chest pain EXAM: CHEST  2 VIEW COMPARISON:  04/01/2015 FINDINGS: Lungs are clear.  No pleural effusion or pneumothorax. The heart is normal in size. Degenerative changes of the visualized thoracolumbar spine. Cholecystectomy clips. IMPRESSION: No evidence of acute cardiopulmonary disease. Electronically Signed   By: Charline Bills M.D.   On: 04/04/2015 13:41   I have personally reviewed and evaluated these images and lab results as part of my medical decision-making.   EKG Interpretation   Date/Time:  Monday April 04 2015 12:03:15 EDT Ventricular Rate:  76 PR Interval:  164 QRS Duration: 86 QT Interval:  398 QTC Calculation: 447 R Axis:   35 Text Interpretation:  Normal sinus rhythm Normal ECG No significant change  since last tracing Confirmed by Gwendolyn Grant  MD, BLAIR ([id number]) on 04/04/2015  2:04:09 PM      MDM   Final diagnoses:  Chest pain, unspecified chest pain type   Pt presenting with substernal chest pain for the past 2 hours. Pain description is same as past ED visits for chest pain. Pain moderately relieved with nitro and aspirin. She had an NSTEMI in March 2015 and had PCI with LAD and RCA stenting. She was hospitalized for chest pain rule out in August 2015 and had lexiscan that was low risk  with no ischemic changes noted. She also had echo at this time with 70% EF. VSS. Pt nontoxic appearing. Heart RRR. Lungs CTAB. Non-focal neuro exam. 1st and 2nd troponins drawn 4 hours apart and  are both 0.00. EKG NSR with no changes from last tracing. Normal CXR. Remaining bloodwork normal. Wells negative. Pt instructed to call her cardiologist (Dr. Tresa Endo) to schedule follow up appointment. Pt has been advised to return to the ED if CP becomes exertional, associated with diaphoresis or nausea, worsens or becomes concerning in any way. Pt appears reliable for follow up and is agreeable to discharge. Case has been discussed with and seen by Dr. Gwendolyn Grant who agrees with the above plan to discharge.      Rolm Gala Staceyann Knouff, PA-C 04/04/15 2218  Elwin Mocha, MD 04/05/15 [phone]

## 2015-04-04 NOTE — ED Notes (Signed)
PA at bedside.

## 2015-04-04 NOTE — ED Notes (Signed)
Reports left sided chest pain that started about 30 minutes ago. Reports some SOb with the pain.

## 2015-04-04 NOTE — ED Notes (Addendum)
Pt from home for eval of substernal cp with radiation to left arm, pt reports pressure and burning that started today while watching tv, pt states took 1 nitro at the time and 3 baby aspirin. Pt reports minimal relief and took an additional nitro en route. Pt rates 4/10 pain at this time, hx of MI recently. Pt alert and oriented, skin warm and dry. nad noted.

## 2015-04-04 NOTE — Discharge Instructions (Signed)
Chest Pain Observation °It is often hard to give a specific diagnosis for the cause of chest pain. Among other possibilities your symptoms might be caused by inadequate oxygen delivery to your heart (angina). Angina that is not treated or evaluated can lead to a heart attack (myocardial infarction) or death. °Blood tests, electrocardiograms, and X-rays may have been done to help determine a possible cause of your chest pain. After evaluation and observation, your health care provider has determined that it is unlikely your pain was caused by an unstable condition that requires hospitalization. However, a full evaluation of your pain may need to be completed, with additional diagnostic testing as directed. It is very important to keep your follow-up appointments. Not keeping your follow-up appointments could result in permanent heart damage, disability, or death. If there is any problem keeping your follow-up appointments, you must call your health care provider. °HOME CARE INSTRUCTIONS  °Due to the slight chance that your pain could be angina, it is important to follow your health care provider's treatment plan and also maintain a healthy lifestyle: °· Maintain or work toward achieving a healthy weight. °· Stay physically active and exercise regularly. °· Decrease your salt intake. °· Eat a balanced, healthy diet. Talk to a dietitian to learn about heart-healthy foods. °· Increase your fiber intake by including whole grains, vegetables, fruits, and nuts in your diet. °· Avoid situations that cause stress, anger, or depression. °· Take medicines as advised by your health care provider. Report any side effects to your health care provider. Do not stop medicines or adjust the dosages on your own. °· Quit smoking. Do not use nicotine patches or gum until you check with your health care provider. °· Keep your blood pressure, blood sugar, and cholesterol levels within normal limits. °· Limit alcohol intake to no more than  1 drink per day for women who are not pregnant and 2 drinks per day for men. °· Do not abuse drugs. °SEEK IMMEDIATE MEDICAL CARE IF: °You have severe chest pain or pressure which may include symptoms such as: °· You feel pain or pressure in your arms, neck, jaw, or back. °· You have severe back or abdominal pain, feel sick to your stomach (nauseous), or throw up (vomit). °· You are sweating profusely. °· You are having a fast or irregular heartbeat. °· You feel short of breath while at rest. °· You notice increasing shortness of breath during rest, sleep, or with activity. °· You have chest pain that does not get better after rest or after taking your usual medicine. °· You wake from sleep with chest pain. °· You are unable to sleep because you cannot breathe. °· You develop a frequent cough or you are coughing up blood. °· You feel dizzy, faint, or experience extreme fatigue. °· You develop severe weakness, dizziness, fainting, or chills. °Any of these symptoms may represent a serious problem that is an emergency. Do not wait to see if the symptoms will go away. Call your local emergency services (911 in the [place] Do not drive yourself to the hospital. °MAKE SURE YOU: °· Understand these instructions. °· Will watch your condition. °· Will get help right away if you are not doing well or get worse. °  °This information is not intended to replace advice given to you by your health care provider. Make sure you discuss any questions you have with your health care provider. °  °Document Released: 06/30/2010 Document Revised: 06/02/2013 Document Reviewed: 11/27/2012 °Elsevier Interactive Patient   Education ©2016 [employer] ° °Nonspecific Chest Pain  °Chest pain can be caused by many different conditions. There is always a chance that your pain could be related to something serious, such as a heart attack or a blood clot in your lungs. Chest pain can also be caused by conditions that are not life-threatening. If you  have chest pain, it is very important to follow up with your health care provider. °CAUSES  °Chest pain can be caused by: °· Heartburn. °· Pneumonia or bronchitis. °· Anxiety or stress. °· Inflammation around your heart (pericarditis) or lung (pleuritis or pleurisy). °· A blood clot in your lung. °· A collapsed lung (pneumothorax). It can develop suddenly on its own (spontaneous pneumothorax) or from trauma to the chest. °· Shingles infection (varicella-zoster virus). °· Heart attack. °· Damage to the bones, muscles, and cartilage that make up your chest wall. This can include: °¨ Bruised bones due to injury. °¨ Strained muscles or cartilage due to frequent or repeated coughing or overwork. °¨ Fracture to one or more ribs. °¨ Sore cartilage due to inflammation (costochondritis). °RISK FACTORS  °Risk factors for chest pain may include: °· Activities that increase your risk for trauma or injury to your chest. °· Respiratory infections or conditions that cause frequent coughing. °· Medical conditions or overeating that can cause heartburn. °· Heart disease or family history of heart disease. °· Conditions or health behaviors that increase your risk of developing a blood clot. °· Having had chicken pox (varicella zoster). °SIGNS AND SYMPTOMS °Chest pain can feel like: °· Burning or tingling on the surface of your chest or deep in your chest. °· Crushing, pressure, aching, or squeezing pain. °· Dull or sharp pain that is worse when you move, cough, or take a deep breath. °· Pain that is also felt in your back, neck, shoulder, or arm, or pain that spreads to any of these areas. °Your chest pain may come and go, or it may stay constant. °DIAGNOSIS °Lab tests or other studies may be needed to find the cause of your pain. Your health care provider may have you take a test called an ambulatory ECG (electrocardiogram). An ECG records your heartbeat patterns at the time the test is performed. You may also have other tests, such  as: °· Transthoracic echocardiogram (TTE). During echocardiography, sound waves are used to create a picture of all of the heart structures and to look at how blood flows through your heart. °· Transesophageal echocardiogram (TEE). This is a more advanced imaging test that obtains images from inside your body. It allows your health care provider to see your heart in finer detail. °· Cardiac monitoring. This allows your health care provider to monitor your heart rate and rhythm in real time. °· Holter monitor. This is a portable device that records your heartbeat and can help to diagnose abnormal heartbeats. It allows your health care provider to track your heart activity for several days, if needed. °· Stress tests. These can be done through exercise or by taking medicine that makes your heart beat more quickly. °· Blood tests. °· Imaging tests. °TREATMENT  °Your treatment depends on what is causing your chest pain. Treatment may include: °· Medicines. These may include: °¨ Acid blockers for heartburn. °¨ Anti-inflammatory medicine. °¨ Pain medicine for inflammatory conditions. °¨ Antibiotic medicine, if an infection is present. °¨ Medicines to dissolve blood clots. °¨ Medicines to treat coronary artery disease. °· Supportive care for conditions that do not require medicines. This may   include: °¨ Resting. °¨ Applying heat or cold packs to injured areas. °¨ Limiting activities until pain decreases. °HOME CARE INSTRUCTIONS °· If you were prescribed an antibiotic medicine, finish it all even if you start to feel better. °· Avoid any activities that bring on chest pain. °· Do not use any tobacco products, including cigarettes, chewing tobacco, or electronic cigarettes. If you need help quitting, ask your health care provider. °· Do not drink alcohol. °· Take medicines only as directed by your health care provider. °· Keep all follow-up visits as directed by your health care provider. This is important. This includes any  further testing if your chest pain does not go away. °· If heartburn is the cause for your chest pain, you may be told to keep your head raised (elevated) while sleeping. This reduces the chance that acid will go from your stomach into your esophagus. °· Make lifestyle changes as directed by your health care provider. These may include: °¨ Getting regular exercise. Ask your health care provider to suggest some activities that are safe for you. °¨ Eating a heart-healthy diet. A registered dietitian can help you to learn healthy eating options. °¨ Maintaining a healthy weight. °¨ Managing diabetes, if necessary. °¨ Reducing stress. °SEEK MEDICAL CARE IF: °· Your chest pain does not go away after treatment. °· You have a rash with blisters on your chest. °· You have a fever. °SEEK IMMEDIATE MEDICAL CARE IF:  °· Your chest pain is worse. °· You have an increasing cough, or you cough up blood. °· You have severe abdominal pain. °· You have severe weakness. °· You faint. °· You have chills. °· You have sudden, unexplained chest discomfort. °· You have sudden, unexplained discomfort in your arms, back, neck, or jaw. °· You have shortness of breath at any time. °· You suddenly start to sweat, or your skin gets clammy. °· You feel nauseous or you vomit. °· You suddenly feel light-headed or dizzy. °· Your heart begins to beat quickly, or it feels like it is skipping beats. °These symptoms may represent a serious problem that is an emergency. Do not wait to see if the symptoms will go away. Get medical help right away. Call your local emergency services (911 in the [place] Do not drive yourself to the hospital. °  °This information is not intended to replace advice given to you by your health care provider. Make sure you discuss any questions you have with your health care provider. °  °Document Released: 03/07/2005 Document Revised: 06/18/2014 Document Reviewed: 01/01/2014 °Elsevier Interactive Patient Education ©2016 [employer]  Inc[employer] ° °

## 2015-04-20 ENCOUNTER — Other Ambulatory Visit: Payer: Self-pay | Admitting: Cardiovascular Disease

## 2015-05-04 ENCOUNTER — Other Ambulatory Visit: Payer: Self-pay | Admitting: Cardiovascular Disease

## 2015-05-19 ENCOUNTER — Telehealth: Payer: Self-pay | Admitting: Cardiovascular Disease

## 2015-05-23 NOTE — Telephone Encounter (Signed)
Close encounter 

## 2015-05-25 ENCOUNTER — Other Ambulatory Visit: Payer: Self-pay | Admitting: Cardiovascular Disease

## 2015-05-25 NOTE — Telephone Encounter (Signed)
Rx has been sent to the pharmacy electronically. ° °

## 2015-06-24 ENCOUNTER — Other Ambulatory Visit: Payer: Self-pay | Admitting: Cardiovascular Disease

## 2015-07-20 ENCOUNTER — Other Ambulatory Visit: Payer: Self-pay

## 2015-07-20 MED ORDER — ATORVASTATIN CALCIUM 40 MG PO TABS: 40.0000 mg | ORAL_TABLET | Freq: Every day | ORAL | Status: DC

## 2015-07-25 ENCOUNTER — Other Ambulatory Visit: Payer: Self-pay | Admitting: *Deleted

## 2015-07-25 ENCOUNTER — Telehealth: Payer: Self-pay | Admitting: Cardiovascular Disease

## 2015-07-25 MED ORDER — METOPROLOL SUCCINATE ER 50 MG PO TB24: 50.0000 mg | ORAL_TABLET | Freq: Two times a day (BID) | ORAL | Status: DC

## 2015-07-26 NOTE — Telephone Encounter (Signed)
Close encounter 

## 2015-07-29 ENCOUNTER — Ambulatory Visit: Payer: BLUE CROSS/BLUE SHIELD | Admitting: Cardiovascular Disease

## 2015-08-03 ENCOUNTER — Ambulatory Visit (INDEPENDENT_AMBULATORY_CARE_PROVIDER_SITE_OTHER): Payer: BLUE CROSS/BLUE SHIELD | Admitting: Cardiovascular Disease

## 2015-08-03 ENCOUNTER — Encounter: Payer: Self-pay | Admitting: Cardiovascular Disease

## 2015-08-03 VITALS — BP 124/80 | HR 72 | Ht 64.0 in | Wt 155.0 lb

## 2015-08-03 DIAGNOSIS — I714 Abdominal aortic aneurysm, without rupture, unspecified: Secondary | ICD-10-CM

## 2015-08-03 DIAGNOSIS — I2510 Atherosclerotic heart disease of native coronary artery without angina pectoris: Secondary | ICD-10-CM

## 2015-08-03 DIAGNOSIS — E785 Hyperlipidemia, unspecified: Secondary | ICD-10-CM

## 2015-08-03 DIAGNOSIS — I10 Essential (primary) hypertension: Secondary | ICD-10-CM

## 2015-08-03 DIAGNOSIS — K219 Gastro-esophageal reflux disease without esophagitis: Secondary | ICD-10-CM

## 2015-08-03 DIAGNOSIS — R079 Chest pain, unspecified: Secondary | ICD-10-CM

## 2015-08-03 MED ORDER — TICAGRELOR 60 MG PO TABS: 60.0000 mg | ORAL_TABLET | Freq: Two times a day (BID) | ORAL | Status: DC

## 2015-08-03 NOTE — Progress Notes (Signed)
Patient ID: Olivia Wade, female   DOB: 1950/11/20, 65 y.o.   MRN: [id number]     HPI: Olivia Wade is a 65 year old female who presents for a 70 month cardiology follow-up evaluation  Olivia Wade had a long-standing history of significant tobacco use having started smoking in 1977 and smoked up to 3 packs per day for many years. She has a history of chronic low back pain. She  has a documented asymptomatic abdominal aortic aneurysm and in 2013 this measured 3.3x3.3 cm. Additional problems include COPD/asthma. She is status post laparoscopic Nissen fundoplication for severe reflux is also status post cholecystectomy and hysterectomy remotely. In July 2014 she did have an echo Doppler study which showed mild LVH with normal systolic function. She did have grade 1 diastolic dysfunction. There was evidence for mild mitral regurgitation. A nuclear perfusion study several years ago which showed normal perfusion without scar or ischemia in 2012.  Olivia Wade developed severe chest pain on 09/08/2013, which time her initial ECG showed hyperacute anterior T waves, which did improve with nitroglycerin and heparin.  She continued to experience chest pain was taken to the cardiac catheterization laboratory and was found to have subtotal proximal LAD stenosis associated with moderate mid distal anterolateral hypocontractility.  She also had a 90% Napkin ring like stenosis in her proximal RCA.  On 09/08/2013 she underwent successful PCI with cutting balloon, PTCA, and DES stenting of her proximal LAD with insertion of a 3.0x23 mm size helpline stent, postdilated to 3.25 mm.  2 days later, she underwent successful intervention to the 90% proximal RCA stenosis and had a 3.0x15 mm  stent, postdilated to 3.25 mm.  Subsequently, she has felt significantly improved, and denies any recurrent chest pain, development.  He quit smoking the day of her acute coronary syndrome.  When I had seen her in follow-up.  She was doing well  without recurrent anginal type symptoms.  She does have chronic shortness of breath.  She was hospitalized overnight in October 17 through 03/28/2014 at [facility] with atypical chest pain.  She has a history of GERD with history of Nissen fundoplication.  Cardiac enzymes were negative.  She has had subsequent emergency room evaluations in October 2016 for recurrent chest pain.  Since I last saw her, she has experienced several episodes of chest discomfort.  She has noticed inability to be active and gets shortness of breath with limited exertion.  She is unaware of any palpitations.  She denies presyncope.  She was supposed have a follow-up abdominal ultrasound last year, but she never had this done.  She presents for follow-up evaluation.  Allergies  Allergen Reactions  . Bee Venom Anaphylaxis, Hives and Swelling  . Penicillins Other (See Comments)    PATIENT REPORTS "MOTHER TOLD HER SHE HAD ALLERGY TO PCN"  . Hydrocodone Other (See Comments)  . Morphine And Related Rash    Current Outpatient Prescriptions  Medication Sig Dispense Refill  . acetaminophen (TYLENOL) 325 MG tablet Take 325 mg by mouth every 6 (six) hours as needed for moderate pain.    Marland Kitchen albuterol (PROVENTIL HFA;VENTOLIN HFA) 108 (90 BASE) MCG/ACT inhaler Inhale 1 puff into the lungs every 6 (six) hours as needed for wheezing or shortness of breath.    Marland Kitchen albuterol (PROVENTIL) (2.5 MG/3ML) 0.083% nebulizer solution Take 2.5 mg by nebulization every 6 (six) hours as needed for wheezing or shortness of breath.    . ALPRAZolam (XANAX) 0.5 MG tablet Take 0.5 mg by mouth 3 (  three) times daily as needed for anxiety or sleep. *Patient is prescribed one tablets three times daily as needed for anxiety    . aspirin EC 81 MG tablet Take 81 mg by mouth daily.    Marland Kitchen atorvastatin (LIPITOR) 40 MG tablet Take 1 tablet (40 mg total) by mouth daily. Need appt before anymore refills 30 tablet 0  . citalopram (CELEXA) 20 MG tablet Take 20 mg by  mouth daily.    . cyclobenzaprine (FLEXERIL) 10 MG tablet Take 1 tablet (10 mg total) by mouth 3 (three) times daily as needed for muscle spasms. 30 tablet 6  . isosorbide mononitrate (IMDUR) 30 MG 24 hr tablet Take 1 tablet (30 mg total) by mouth 2 (two) times daily. (Patient taking differently: Take 30 mg by mouth daily. ) 60 tablet 1  . lisinopril (PRINIVIL,ZESTRIL) 5 MG tablet take 1 tablet by mouth once daily 30 tablet 10  . MELATONIN PO Take 1 tablet by mouth daily as needed (sleep).    . metoprolol succinate (TOPROL-XL) 50 MG 24 hr tablet Take 1 tablet (50 mg total) by mouth 2 (two) times daily. Take with or immediately following a meal. 60 tablet 0  . montelukast (SINGULAIR) 10 MG tablet Take 10 mg by mouth at bedtime.    . nitroGLYCERIN (NITROSTAT) 0.4 MG SL tablet Place 1 tablet (0.4 mg total) under the tongue every 5 (five) minutes as needed for chest pain. NEED OV. 25 tablet 0  . pantoprazole (PROTONIX) 40 MG tablet Take 1 tablet (40 mg total) by mouth every morning. 60 tablet 5   No current facility-administered medications for this visit.   Social history is notable in that she is married, has 3 children, 11 grandchildren, and recently has a great-grandchild. She continues to smoke cigarettes and has been smoking since 1977. She does not exercise. There is no alcohol use.  Family History  Problem Relation Age of Onset  . Heart attack Father   . Heart attack Son 58  . Cancer Father     Esophageal  . Hypertension Mother    ROS General: Negative; No fevers, chills, or night sweats;  HEENT: Negative; No changes in vision or hearing, sinus congestion, difficulty swallowing Pulmonary: Negative; No cough, wheezing, shortness of breath, hemoptysis Cardiovascular:  See HPI History of abdominal aortic aneurysm GI: History of GERD, status post fundoplication; No nausea, vomiting, diarrhea, or abdominal pain GU: Negative; No dysuria, hematuria, or difficulty voiding Musculoskeletal:  Negative; no myalgias, joint pain, or weakness Hematologic/Oncology: Negative; no easy bruising, bleeding Endocrine: Negative; no heat/cold intolerance; no diabetes Neuro: Negative; no changes in balance, headaches Skin: Negative; No rashes or skin lesions Psychiatric: History of anxiety and depression Sleep: Negative; No snoring, daytime sleepiness, hypersomnolence, bruxism, restless legs, hypnogognic hallucinations, no cataplexy Other comprehensive 14 point system review is negative.   PE BP 124/80 mmHg  Pulse 72  Ht '5\' 4"'$  (1.626 m)  Wt 155 lb (70.308 kg)  BMI 26.59 kg/m2   Wt Readings from Last 3 Encounters:  08/03/15 155 lb (70.308 kg)  04/01/15 140 lb (63.504 kg)  08/25/14 146 lb (66.225 kg)   General: Alert, oriented, no distress. She appears significantly older than her stated age. Skin: normal turgor, no rashes HEENT: Normocephalic, atraumatic. Pupils round and reactive; sclera anicteric; Fundi mild arteriolar narrowing. No hemorrhages or exudates Nose without nasal septal hypertrophy Mouth/Parynx benign; Mallinpatti scale 3 Neck: No JVD, no carotid bruits, normal carotid upstroke Lungs: Diffusely decreased breath sounds; no wheezing or rales  Chest wall: without tenderness to palpitation Heart: RRR, s1 s2 normal 1/6 systolic murmur. No S3 gallop.  No diastolic murmur.  No rubs, thrills or heaves. Abdomen: soft, nontender; no hepatosplenomehaly, BS+; abdominal aorta nontender and not dilated by palpation. Back: no CVA tenderness Pulses 2+ Extremities: no clubbing cyanosis or edema, Homan's sign negative  Neurologic: grossly nonfocal; Cranial nerves grossly wnl Psychologic: Normal mood and affect  ECG (independently read by me): Sinus rhythm at 72 bpm.  No ectopy.  Normal intervals.  November 2015 ECG (independently read by me): Normal sinus rhythm at 61 bpm.  Nonspecific ST changes.  April 2015 ECG (independently read by me): Normal sinus rhythm at 73 beats per minute  with preserved R waves but diffuse T-wave inversion V2 through V6, as well as in leads 1, 2 3-1/2.  Prior 05/26/2013 ECG: Sinus rhythm 82 beats per minute. QTc interval 450 Olivia. No significant ST-T changes  LABS: BMP Latest Ref Rng 04/04/2015 04/01/2015 08/25/2014  Glucose 65 - 99 mg/dL 95 95 104(H)  BUN 6 - 20 mg/dL '7 13 11  '$ Creatinine 0.44 - 1.00 mg/dL 0.99 1.04(H) 0.88  Sodium 135 - 145 mmol/L 136 136 134(L)  Potassium 3.5 - 5.1 mmol/L 4.2 3.7 3.5  Chloride 101 - 111 mmol/L 108 105 103  CO2 22 - 32 mmol/L 20(L) 23 25  Calcium 8.9 - 10.3 mg/dL 9.0 8.7(L) 8.4   Hepatic Function Latest Ref Rng 08/25/2014 05/28/2014 12/23/2013  Total Protein 6.0 - 8.3 g/dL 6.8 7.7 7.4  Albumin 3.5 - 5.2 g/dL 3.5 3.9 3.7  AST 0 - 37 U/L '19 17 20  '$ ALT 0 - 35 U/L '16 14 14  '$ Alk Phosphatase 39 - 117 U/L 77 94 96  Total Bilirubin 0.3 - 1.2 mg/dL 0.7 0.7 0.4  Bilirubin, Direct 0.0 - 0.3 mg/dL - - <0.2   CBC Latest Ref Rng 04/04/2015 04/01/2015 08/25/2014  WBC 4.0 - 10.5 K/uL 6.3 8.1 6.5  Hemoglobin 12.0 - 15.0 g/dL 12.0 11.2(L) 11.5(L)  Hematocrit 36.0 - 46.0 % 36.5 33.5(L) 34.4(L)  Platelets 150 - 400 K/uL 297 262 282   Lab Results  Component Value Date   MCV 96.8 04/04/2015   MCV 96.0 04/01/2015   MCV 95.0 08/25/2014   Lab Results  Component Value Date   HGBA1C 5.2 12/22/2013   Lab Results  Component Value Date   TSH 0.665 12/22/2013   Lipid Panel     Component Value Date/Time   CHOL 128 03/28/2014 0344   TRIG 337* 03/28/2014 0344   HDL 30* 03/28/2014 0344   CHOLHDL 4.3 03/28/2014 0344   VLDL 67* 03/28/2014 0344   LDLCALC 31 03/28/2014 0344   RADIOLOGY: No results found.   ASSESSMENT AND PLAN:  Olivia Wade is a 64 year old female who has a history of COPD/asthma and had smoked for over 37 years prior to quitting in March 2015.  She presented with an acute coronary syndrome/non-ST segment elevation MI and initially had hyperacute ST elevation, which did improve with nitroglycerin and  heparin.  She was taken to the catheterization laboratory and was found to have subtotal proximal LAD stenosis, which was successfully stented to cover as well the diffuse 50% stenosis proximal to the high-grade lesion.  She subsequently underwent staged intervention to her high-grade RCA stenosis 2 days later.  She has continued to be on dual antiplatelet therapy since her acute coronary syndrome.  When she is to renew her next dose I will reduce this to Brilinta 60 mg per  Pegasys trial data.  She has experienced episodes of chest discomfort with some atypical features but also associated with chest pressure.  Her last nuclear perfusion study was in 2015.  With her recurrent ER evaluations.  I have recommended follow-up Smoke Rise study for further evaluation.  I will also schedule her for a follow-up echo Doppler study to further evaluate her shortness of breath and valvular architecture.  She is not had a follow-up abdominal ultrasound of her aorta.  Since 2013, and this will also be scheduled.  A complete set of blood work will be obtained in the fasting state.  She currently is on atorvastatin 40 mg for hyperlipidemia with target LDL less than 70.  She continues to take isosorbide 60 mg, lisinopril 5 mg, and Toprol 50 mg twice a day for blood pressure and CAD.  She has GERD on Protonix and this remains stable.  She does have some stress and anxiety and takes Celexa, as well as when necessary 8.  Presently in.  She has a history of asthma, COPD and continues to be on Singulair 10 mg and albuterol.  I will see her in 6-8 weeks for follow up evaluation.  Time spent: 30 minutes Troy Sine, MD, [facility] 08/03/2015 4:48 PM

## 2015-08-03 NOTE — Patient Instructions (Addendum)
Your physician has requested that you have an echocardiogram. Echocardiography is a painless test that uses sound waves to create images of your heart. It provides your doctor with information about the size and shape of your heart and how well your heart's chambers and valves are working. This procedure takes approximately one hour. There are no restrictions for this procedure.  Your physician recommends that you return for lab work fasting.  Your physician has recommended you make the following change in your medication:  The Brilinta has been changed to 60 mg twice a day. A new prescription has been sent to your pharmacy.  Your physician has requested that you have a lexiscan myoview. For further information please visit [email or web address] Please follow instruction sheet, as given.  Your physician has requested that you have an abdominal aorta duplex. During this test, an ultrasound is used to evaluate the aorta. Allow 30 minutes for this exam. Do not eat after midnight the day before and avoid carbonated beverages  Your physician recommends that you schedule a follow-up appointment in: 8 weeks with Dr Tresa Endo.

## 2015-08-04 ENCOUNTER — Other Ambulatory Visit: Payer: Self-pay | Admitting: *Deleted

## 2015-08-04 DIAGNOSIS — I10 Essential (primary) hypertension: Secondary | ICD-10-CM

## 2015-08-04 DIAGNOSIS — I714 Abdominal aortic aneurysm, without rupture, unspecified: Secondary | ICD-10-CM

## 2015-08-04 DIAGNOSIS — I2510 Atherosclerotic heart disease of native coronary artery without angina pectoris: Secondary | ICD-10-CM

## 2015-08-04 DIAGNOSIS — I2583 Coronary atherosclerosis due to lipid rich plaque: Principal | ICD-10-CM

## 2015-08-15 ENCOUNTER — Other Ambulatory Visit: Payer: Self-pay

## 2015-08-15 ENCOUNTER — Other Ambulatory Visit: Payer: Self-pay | Admitting: *Deleted

## 2015-08-15 DIAGNOSIS — I2510 Atherosclerotic heart disease of native coronary artery without angina pectoris: Secondary | ICD-10-CM

## 2015-08-15 DIAGNOSIS — I2583 Coronary atherosclerosis due to lipid rich plaque: Principal | ICD-10-CM

## 2015-08-15 DIAGNOSIS — R0602 Shortness of breath: Secondary | ICD-10-CM

## 2015-08-15 DIAGNOSIS — R079 Chest pain, unspecified: Secondary | ICD-10-CM

## 2015-08-15 MED ORDER — NITROGLYCERIN 0.4 MG SL SUBL: 0.4000 mg | SUBLINGUAL_TABLET | SUBLINGUAL | Status: DC | PRN

## 2015-08-15 NOTE — Telephone Encounter (Signed)
Rx(s) sent to pharmacy electronically.  

## 2015-08-16 ENCOUNTER — Encounter (HOSPITAL_COMMUNITY)
Admission: RE | Admit: 2015-08-16 | Discharge: 2015-08-16 | Disposition: A | Discharge status: Home / Self Care | Payer: BLUE CROSS/BLUE SHIELD | Source: Ambulatory Visit | Attending: Cardiovascular Disease | Admitting: Cardiovascular Disease

## 2015-08-16 ENCOUNTER — Encounter (HOSPITAL_COMMUNITY): Payer: Self-pay

## 2015-08-16 ENCOUNTER — Emergency Department (HOSPITAL_COMMUNITY)
Admission: EM | Admit: 2015-08-16 | Discharge: 2015-08-16 | Disposition: A | Discharge status: Home / Self Care | Payer: BLUE CROSS/BLUE SHIELD | Attending: Emergency Medicine | Admitting: Emergency Medicine

## 2015-08-16 ENCOUNTER — Emergency Department (HOSPITAL_COMMUNITY): Payer: BLUE CROSS/BLUE SHIELD

## 2015-08-16 ENCOUNTER — Ambulatory Visit (HOSPITAL_COMMUNITY)
Admission: RE | Admit: 2015-08-16 | Discharge: 2015-08-16 | Disposition: A | Discharge status: Home / Self Care | Payer: BLUE CROSS/BLUE SHIELD | Source: Ambulatory Visit | Attending: Cardiovascular Disease | Admitting: Cardiovascular Disease

## 2015-08-16 ENCOUNTER — Ambulatory Visit (HOSPITAL_COMMUNITY): Admission: RE | Admit: 2015-08-16 | Payer: BLUE CROSS/BLUE SHIELD | Source: Ambulatory Visit

## 2015-08-16 ENCOUNTER — Encounter (HOSPITAL_COMMUNITY): Payer: Self-pay | Admitting: Emergency Medicine

## 2015-08-16 ENCOUNTER — Other Ambulatory Visit: Payer: Self-pay

## 2015-08-16 ENCOUNTER — Ambulatory Visit (HOSPITAL_BASED_OUTPATIENT_CLINIC_OR_DEPARTMENT_OTHER)
Admission: RE | Admit: 2015-08-16 | Discharge: 2015-08-16 | Disposition: A | Discharge status: Home / Self Care | Payer: BLUE CROSS/BLUE SHIELD | Source: Ambulatory Visit | Attending: Cardiovascular Disease | Admitting: Cardiovascular Disease

## 2015-08-16 DIAGNOSIS — I358 Other nonrheumatic aortic valve disorders: Secondary | ICD-10-CM | POA: Insufficient documentation

## 2015-08-16 DIAGNOSIS — I2510 Atherosclerotic heart disease of native coronary artery without angina pectoris: Secondary | ICD-10-CM | POA: Insufficient documentation

## 2015-08-16 DIAGNOSIS — I252 Old myocardial infarction: Secondary | ICD-10-CM | POA: Diagnosis not present

## 2015-08-16 DIAGNOSIS — I714 Abdominal aortic aneurysm, without rupture, unspecified: Secondary | ICD-10-CM

## 2015-08-16 DIAGNOSIS — R112 Nausea with vomiting, unspecified: Secondary | ICD-10-CM | POA: Diagnosis not present

## 2015-08-16 DIAGNOSIS — R109 Unspecified abdominal pain: Secondary | ICD-10-CM

## 2015-08-16 DIAGNOSIS — M1990 Unspecified osteoarthritis, unspecified site: Secondary | ICD-10-CM | POA: Diagnosis not present

## 2015-08-16 DIAGNOSIS — R0602 Shortness of breath: Secondary | ICD-10-CM | POA: Insufficient documentation

## 2015-08-16 DIAGNOSIS — Z87891 Personal history of nicotine dependence: Secondary | ICD-10-CM | POA: Insufficient documentation

## 2015-08-16 DIAGNOSIS — Z7982 Long term (current) use of aspirin: Secondary | ICD-10-CM | POA: Diagnosis not present

## 2015-08-16 DIAGNOSIS — J449 Chronic obstructive pulmonary disease, unspecified: Secondary | ICD-10-CM | POA: Diagnosis not present

## 2015-08-16 DIAGNOSIS — Z9889 Other specified postprocedural states: Secondary | ICD-10-CM | POA: Diagnosis not present

## 2015-08-16 DIAGNOSIS — R1013 Epigastric pain: Secondary | ICD-10-CM | POA: Diagnosis present

## 2015-08-16 DIAGNOSIS — I10 Essential (primary) hypertension: Secondary | ICD-10-CM | POA: Diagnosis not present

## 2015-08-16 DIAGNOSIS — E785 Hyperlipidemia, unspecified: Secondary | ICD-10-CM | POA: Diagnosis not present

## 2015-08-16 DIAGNOSIS — R079 Chest pain, unspecified: Secondary | ICD-10-CM

## 2015-08-16 DIAGNOSIS — I119 Hypertensive heart disease without heart failure: Secondary | ICD-10-CM | POA: Insufficient documentation

## 2015-08-16 DIAGNOSIS — I2583 Coronary atherosclerosis due to lipid rich plaque: Secondary | ICD-10-CM

## 2015-08-16 DIAGNOSIS — Z79899 Other long term (current) drug therapy: Secondary | ICD-10-CM | POA: Diagnosis not present

## 2015-08-16 LAB — CBG MONITORING, ED: Glucose-Capillary: 123 mg/dL — ABNORMAL HIGH (ref 65–99)

## 2015-08-16 LAB — CBC WITH DIFFERENTIAL/PLATELET
BASOS PCT: 1 %
Basophils Absolute: 0 10*3/uL (ref 0.0–0.1)
EOS ABS: 0 10*3/uL (ref 0.0–0.7)
Eosinophils Relative: 1 %
HCT: 38.4 % (ref 36.0–46.0)
HEMOGLOBIN: 12.4 g/dL (ref 12.0–15.0)
Lymphocytes Relative: 44 %
Lymphs Abs: 3.4 10*3/uL (ref 0.7–4.0)
MCH: 30.7 pg (ref 26.0–34.0)
MCHC: 32.3 g/dL (ref 30.0–36.0)
MCV: 95 fL (ref 78.0–100.0)
Monocytes Absolute: 0.6 10*3/uL (ref 0.1–1.0)
Monocytes Relative: 7 %
NEUTROS PCT: 47 %
Neutro Abs: 3.7 10*3/uL (ref 1.7–7.7)
Platelets: 349 10*3/uL (ref 150–400)
RBC: 4.04 MIL/uL (ref 3.87–5.11)
RDW: 13.8 % (ref 11.5–15.5)
WBC: 7.8 10*3/uL (ref 4.0–10.5)

## 2015-08-16 LAB — NM MYOCAR MULTI W/SPECT W/WALL MOTION / EF
CHL CUP NUCLEAR SDS: 4
CSEPPHR: 100 {beats}/min
LHR: 0.27
LV sys vol: 23 mL
LVDIAVOL: 52 mL
Rest HR: 74 {beats}/min
SRS: 0
SSS: 4
TID: 0.99

## 2015-08-16 LAB — COMPREHENSIVE METABOLIC PANEL
ALBUMIN: 3.8 g/dL (ref 3.5–5.0)
ALK PHOS: 99 U/L (ref 38–126)
ALT: 19 U/L (ref 14–54)
AST: 33 U/L (ref 15–41)
Anion gap: 8 (ref 5–15)
BUN: 11 mg/dL (ref 6–20)
CALCIUM: 8.6 mg/dL — AB (ref 8.9–10.3)
CO2: 25 mmol/L (ref 22–32)
CREATININE: 0.83 mg/dL (ref 0.44–1.00)
Chloride: 103 mmol/L (ref 101–111)
GFR calc Af Amer: >60 mL/min (ref >60)
GFR calc non Af Amer: >60 mL/min (ref >60)
GLUCOSE: 112 mg/dL — AB (ref 65–99)
Potassium: 3.3 mmol/L — ABNORMAL LOW (ref 3.5–5.1)
SODIUM: 136 mmol/L (ref 135–145)
Total Bilirubin: 0.8 mg/dL (ref 0.3–1.2)
Total Protein: 8.1 g/dL (ref 6.5–8.1)

## 2015-08-16 LAB — LIPASE, BLOOD: Lipase: 28 U/L (ref 11–51)

## 2015-08-16 LAB — TROPONIN I: Troponin I: <0.03 ng/mL (ref <0.031)

## 2015-08-16 MED ORDER — TECHNETIUM TC 99M SESTAMIBI - CARDIOLITE
30.0000 | Freq: Once | INTRAVENOUS | Status: AC | PRN
  Administered 2015-08-16: 12:00:00 31 via INTRAVENOUS

## 2015-08-16 MED ORDER — ONDANSETRON 8 MG PO TBDP: 8.0000 mg | ORAL_TABLET | Freq: Three times a day (TID) | ORAL | Status: DC | PRN

## 2015-08-16 MED ORDER — MORPHINE SULFATE (PF) 4 MG/ML IV SOLN
6.0000 mg | Freq: Once | INTRAVENOUS | Status: AC
  Administered 2015-08-16: 6 mg via INTRAVENOUS
  Filled 2015-08-16: qty 2

## 2015-08-16 MED ORDER — SODIUM CHLORIDE 0.9% FLUSH
INTRAVENOUS | Status: AC
  Administered 2015-08-16: 10 mL via INTRAVENOUS
  Filled 2015-08-16: qty 10

## 2015-08-16 MED ORDER — IOHEXOL 350 MG/ML SOLN
100.0000 mL | Freq: Once | INTRAVENOUS | Status: AC | PRN
  Administered 2015-08-16: 100 mL via INTRAVENOUS

## 2015-08-16 MED ORDER — ONDANSETRON HCL 4 MG/2ML IJ SOLN
4.0000 mg | Freq: Once | INTRAMUSCULAR | Status: AC
  Administered 2015-08-16: 4 mg via INTRAVENOUS
  Filled 2015-08-16: qty 2

## 2015-08-16 MED ORDER — TECHNETIUM TC 99M SESTAMIBI GENERIC - CARDIOLITE
10.0000 | Freq: Once | INTRAVENOUS | Status: AC | PRN
  Administered 2015-08-16: 10 via INTRAVENOUS

## 2015-08-16 MED ORDER — REGADENOSON 0.4 MG/5ML IV SOLN
INTRAVENOUS | Status: AC
  Administered 2015-08-16: 0.4 mg via INTRAVENOUS
  Filled 2015-08-16: qty 5

## 2015-08-16 NOTE — ED Notes (Signed)
Pt had Cardiolite stress test x 1.5 hours ago. Pt was scheduled to have outpatient [place] for AAA today. Pt became pale/diaphoretic/nauseous, c/o midepigastric pain radiating into chest.

## 2015-08-16 NOTE — ED Notes (Signed)
Dr. Campos at bedside updating patient and family. 

## 2015-08-16 NOTE — ED Provider Notes (Signed)
CSN: [id number]     Arrival date & time 08/16/15  1328 History   First MD Initiated Contact with Patient 08/16/15 1335     Chief Complaint  Patient presents with  . Chest Pain      HPI Patient presents to the emergency department with complaints of acute onset epigastric abdominal discomfort radiating up into her chest.  She presents with nausea and vomiting.  Nursing notes report that she was mildly pale and diaphoretic on arrival.  She reports the pain is located in her epigastric region.  She denies back pain or flank pain.  She does have a known abdominal aortic aneurysm for which she is being followed with serial abdominal aortic ultrasounds.  She was asked to schedule for a ultrasound of her aorta today when her discomfort and pain began.  No recent sick contacts.  She has previously had a cholecystectomy.  She does have a history of coronary artery disease with prior PCI.  She denies chest pain or chest tightness at this time.  She has never seen the vascular surgeons in regard to her abdominal aneurysm.    Past Medical History  Diagnosis Date  . AAA (abdominal aortic aneurysm) (HCC)   . Tobacco abuse   . HTN (hypertension)   . COPD (chronic obstructive pulmonary disease) (HCC)   . Asthma   . HLD (hyperlipidemia)   . GERD (gastroesophageal reflux disease)   . Chronic low back pain     (Old vertebral fracture)  . Arthritis   . Coronary artery disease 09/08/13    with PCI  . NSTEMI (non-ST elevated myocardial infarction) (HCC) 09/08/13  . Heart palpitations     prn toprol for rapid HR  . History of surgery on arm    Past Surgical History  Procedure Laterality Date  . Coronary stent placement  09/08/13    PCI with cutting balloon, PTCA,DES to prox LAD  . Coronary angioplasty with stent placement  09/10/13    PCI of RCA 90% proximal RCA stenosis with DES  . Laparoscopic nissen fundoplication      for severe reflux  . Cholecystectomy    . Abdominal hysterectomy    . 2d echo   12/2012    normal LV with mild LVH, grade 1 diastolic dysfunction  . Doppler echocardiography  12/31/2012  . Nuclear stress test  05/09/2011    NORMAL PATTREN OF PERFUSION IN ALL REGIONS. LV IS NORMAL IS SIZE. EF 68% NO WALL MOTION ABNORMALITIES.  Marland Kitchen Abdominal aortic duplex  05/19/2012    WHEN COMPARED TO PRIOR STUDY DATED 06/01/11 THERE IS AN INCREASE IN SIZE OF DILATATION.  Marland Kitchen Left heart catheterization with coronary angiogram N/A 09/08/2013    Procedure: LEFT HEART CATHETERIZATION WITH CORONARY ANGIOGRAM;  Surgeon: Lennette Bihari, MD;  Location: [facility] CATH LAB;  Service: Cardiovascular;  Laterality: N/A;  . Percutaneous coronary stent intervention (pci-s) N/A 09/10/2013    Procedure: PERCUTANEOUS CORONARY STENT INTERVENTION (PCI-S);  Surgeon: Lennette Bihari, MD;  Location: [facility] CATH LAB;  Service: Cardiovascular;  Laterality: N/A;   Family History  Problem Relation Age of Onset  . Heart attack Father   . Heart attack Son 30  . Cancer Father     Esophageal  . Hypertension Mother    Social History  Substance Use Topics  . Smoking status: Former Smoker -- 0.50 packs/day for 37 years    Types: Cigarettes    Quit date: 09/08/2013  . Smokeless tobacco: Never Used  . Alcohol Use:  No   OB History    No data available     Review of Systems  All other systems reviewed and are negative.     Allergies  Bee venom; Penicillins; Hydrocodone; and Morphine and related  Home Medications   Prior to Admission medications   Medication Sig Start Date End Date Taking? Authorizing Provider  acetaminophen (TYLENOL) 325 MG tablet Take 325 mg by mouth every 6 (six) hours as needed for moderate pain.    Historical Provider, MD  albuterol (PROVENTIL HFA;VENTOLIN HFA) 108 (90 BASE) MCG/ACT inhaler Inhale 1 puff into the lungs every 6 (six) hours as needed for wheezing or shortness of breath.    Historical Provider, MD  albuterol (PROVENTIL) (2.5 MG/3ML) 0.083% nebulizer solution Take 2.5 mg by nebulization  every 6 (six) hours as needed for wheezing or shortness of breath.    Historical Provider, MD  ALPRAZolam Prudy Feeler) 0.5 MG tablet Take 0.5 mg by mouth 3 (three) times daily as needed for anxiety or sleep. *Patient is prescribed one tablets three times daily as needed for anxiety    Historical Provider, MD  aspirin EC 81 MG tablet Take 81 mg by mouth daily.    Historical Provider, MD  atorvastatin (LIPITOR) 40 MG tablet Take 1 tablet (40 mg total) by mouth daily. Need appt before anymore refills 07/20/15   Lennette Bihari, MD  citalopram (CELEXA) 20 MG tablet Take 20 mg by mouth daily.    Historical Provider, MD  cyclobenzaprine (FLEXERIL) 10 MG tablet Take 1 tablet (10 mg total) by mouth 3 (three) times daily as needed for muscle spasms.    Lennette Bihari, MD  isosorbide mononitrate (IMDUR) 30 MG 24 hr tablet Take 1 tablet (30 mg total) by mouth 2 (two) times daily. Patient taking differently: Take 30 mg by mouth daily.  05/28/14   Henderson Cloud, MD  lisinopril (PRINIVIL,ZESTRIL) 5 MG tablet take 1 tablet by mouth once daily 05/04/15   Lennette Bihari, MD  MELATONIN PO Take 1 tablet by mouth daily as needed (sleep).    Historical Provider, MD  metoprolol succinate (TOPROL-XL) 50 MG 24 hr tablet Take 1 tablet (50 mg total) by mouth 2 (two) times daily. Take with or immediately following a meal. 07/25/15   Lennette Bihari, MD  montelukast (SINGULAIR) 10 MG tablet Take 10 mg by mouth at bedtime.    Historical Provider, MD  nitroGLYCERIN (NITROSTAT) 0.4 MG SL tablet Place 1 tablet (0.4 mg total) under the tongue every 5 (five) minutes as needed for chest pain. 08/15/15   Lennette Bihari, MD  pantoprazole (PROTONIX) 40 MG tablet Take 1 tablet (40 mg total) by mouth every morning. 11/25/14   Lennette Bihari, MD  ticagrelor (BRILINTA) 60 MG TABS tablet Take 1 tablet (60 mg total) by mouth 2 (two) times daily. 08/03/15   Lennette Bihari, MD   BP 128/85 mmHg  Pulse 75  Temp(Src) 97.8 F (36.6 C)  Resp 18   Ht 5\' 4"  (1.626 m)  Wt 155 lb (70.308 kg)  BMI 26.59 kg/m2  SpO2 98% Physical Exam  Constitutional: She is oriented to person, place, and time. She appears well-developed and well-nourished. No distress.  HENT:  Head: Normocephalic and atraumatic.  Eyes: EOM are normal.  Neck: Normal range of motion.  Cardiovascular: Normal rate, regular rhythm and normal heart sounds.   Pulmonary/Chest: Effort normal and breath sounds normal.  Abdominal: Soft. She exhibits no distension.  Mild epigastric tenderness  without guarding or rebound.  Musculoskeletal: Normal range of motion.  Neurological: She is alert and oriented to person, place, and time.  Skin: Skin is warm and dry.  Psychiatric: She has a normal mood and affect. Judgment normal.  Nursing note and vitals reviewed.   ED Course  Procedures  Labs Review Labs Reviewed  COMPREHENSIVE METABOLIC PANEL - Abnormal; Notable for the following:    Potassium 3.3 (*)    Glucose, Bld 112 (*)    Calcium 8.6 (*)    All other components within normal limits  CBG MONITORING, ED - Abnormal; Notable for the following:    Glucose-Capillary 123 (*)    All other components within normal limits  CBC WITH DIFFERENTIAL/PLATELET  LIPASE, BLOOD  TROPONIN I    Imaging Review Nm Myocar Multi W/spect W/wall Motion / Ef  08/16/2015   There was no ST segment deviation noted during stress.  The study is normal. There are no perfusion defects consistent with prior infarction or current ischemia  This is a low risk study.  The left ventricular ejection fraction is normal (55-65%).    Ct Angio Chest Aorta W/cm &/or Wo/cm  08/16/2015  CLINICAL DATA:  Chest and abdominal pain EXAM: CT ANGIOGRAPHY CHEST, ABDOMEN AND PELVIS TECHNIQUE: Multidetector CT imaging through the chest, abdomen and pelvis was performed using the standard protocol during bolus administration of intravenous contrast. Multiplanar reconstructed images and MIPs were obtained and reviewed to  evaluate the vascular anatomy. CONTRAST:  OMNIPAQUE IOHEXOL 350 MG/ML SOLN COMPARISON:  08/25/2014 and 06/10/2012 FINDINGS: CTA CHEST FINDINGS There is no evidence of intramural hematoma or aortic dissection allowing for motion artifact. 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 The lumen is widely patent. Right common carotid and subclavian arteries are patent. Left common carotid artery and subclavian artery are patent. Bovine configuration is noted. Vertebral arteries are patent within the confines of the examination. No obvious filling defect is present in the pulmonary arterial tree to suggest acute pulmonary thromboembolism. No abnormal mediastinal adenopathy. Stable mildly prominent left hilar nodes. No pneumothorax.  No pleural effusion Mild emphysema towards the lung apices. There is dependent atelectasis posteriorly in both lungs. There is a calcified granuloma in the right middle lobe. There is a stent in the LAD. Thoracic vertebral compression deformities are stable. Review of the MIP images confirms the above findings. CTA ABDOMEN AND PELVIS FINDINGS The appearance of the aorta has changed since the prior study. Maximal aortic diameter is 4.2 cm in AP dimension. Previously, maximal diameter is 3.5 cm. There is also now a a rind of soft tissue density surrounding the aorta. There is very little stranding associated with the rind of soft tissue density. It is not hyperdense. There is no extravasation of contrast. The soft tissue is somewhat thickened in the left anterior and lateral location. There is no associated abnormal adenopathy. There is no obvious retroperitoneal hemorrhage into the adjacent fat. Celiac is patent.  Branch vessels are patent. Calcified plaque at the origin of the SMA is present without significant narrowing. IMA is diminutive and extends through the rind of soft tissue surrounding the aortic aneurysm. It is patent. Single renal  arteries are patent. Mild atherosclerotic changes of the common iliac arteries. They are patent. Bilateral external iliac arteries are patent. Bilateral internal iliac arteries are patent. Postcholecystectomy Spleen, pancreas, adrenal glands are within normal limits. Simple cyst in the upper pole of the right kidney. Left kidney is within normal  limits. No free-fluid.  No abnormal retroperitoneal adenopathy. No focal bowel wall thickening to suggest malignancy. No dilatation of bowel to suggest obstruction. There is prolapse of the bladder towards the vagina. Stable minimal lumbar compression deformities. Lumbosacral junction is transitional. Review of the MIP images confirms the above findings. IMPRESSION: No evidence of intramural hematoma or aortic dissection within the thorax. The appearance of the aorta has changed. There is now a soft tissue rind surrounding the aortic aneurysm with some asymmetry and very little stranding. Differential diagnosis includes vasculitis, retroperitoneal fibrosis, hemorrhage, or less likely, lymphoma. There is no overt extended retroperitoneal hemorrhage. I suspect that if this does represent hemorrhage, it may be subacute. There is no contrast extravasation to suggest active bleeding. In the case of vasculitis, I would expect more inflammatory change, but autoimmune vasculitis or mycotic aneurysm with bacterial infection are considerations. Aortic aneurysm is increased from 3.5 cm to 4.2 cm Electronically Signed   By: Jolaine Click M.D.   On: 08/16/2015 16:22   Ct Cta Abd/pel W/cm &/or W/o Cm  08/16/2015  CLINICAL DATA:  Chest and abdominal pain EXAM: CT ANGIOGRAPHY CHEST, ABDOMEN AND PELVIS TECHNIQUE: Multidetector CT imaging through the chest, abdomen and pelvis was performed using the standard protocol during bolus administration of intravenous contrast. Multiplanar reconstructed images and MIPs were obtained and reviewed to evaluate the vascular anatomy. CONTRAST:   OMNIPAQUE IOHEXOL 350 MG/ML SOLN COMPARISON:  08/25/2014 and 06/10/2012 FINDINGS: CTA CHEST FINDINGS There is no evidence of intramural hematoma or aortic dissection allowing for motion artifact. 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 The lumen is widely patent. Right common carotid and subclavian arteries are patent. Left common carotid artery and subclavian artery are patent. Bovine configuration is noted. Vertebral arteries are patent within the confines of the examination. No obvious filling defect is present in the pulmonary arterial tree to suggest acute pulmonary thromboembolism. No abnormal mediastinal adenopathy. Stable mildly prominent left hilar nodes. No pneumothorax.  No pleural effusion Mild emphysema towards the lung apices. There is dependent atelectasis posteriorly in both lungs. There is a calcified granuloma in the right middle lobe. There is a stent in the LAD. Thoracic vertebral compression deformities are stable. Review of the MIP images confirms the above findings. CTA ABDOMEN AND PELVIS FINDINGS The appearance of the aorta has changed since the prior study. Maximal aortic diameter is 4.2 cm in AP dimension. Previously, maximal diameter is 3.5 cm. There is also now a a rind of soft tissue density surrounding the aorta. There is very little stranding associated with the rind of soft tissue density. It is not hyperdense. There is no extravasation of contrast. The soft tissue is somewhat thickened in the left anterior and lateral location. There is no associated abnormal adenopathy. There is no obvious retroperitoneal hemorrhage into the adjacent fat. Celiac is patent.  Branch vessels are patent. Calcified plaque at the origin of the SMA is present without significant narrowing. IMA is diminutive and extends through the rind of soft tissue surrounding the aortic aneurysm. It is patent. Single renal arteries are patent. Mild atherosclerotic changes of  the common iliac arteries. They are patent. Bilateral external iliac arteries are patent. Bilateral internal iliac arteries are patent. Postcholecystectomy Spleen, pancreas, adrenal glands are within normal limits. Simple cyst in the upper pole of the right kidney. Left kidney is within normal limits. No free-fluid.  No abnormal retroperitoneal adenopathy. No focal bowel wall thickening to suggest malignancy. No dilatation  of bowel to suggest obstruction. There is prolapse of the bladder towards the vagina. Stable minimal lumbar compression deformities. Lumbosacral junction is transitional. Review of the MIP images confirms the above findings. IMPRESSION: No evidence of intramural hematoma or aortic dissection within the thorax. The appearance of the aorta has changed. There is now a soft tissue rind surrounding the aortic aneurysm with some asymmetry and very little stranding. Differential diagnosis includes vasculitis, retroperitoneal fibrosis, hemorrhage, or less likely, lymphoma. There is no overt extended retroperitoneal hemorrhage. I suspect that if this does represent hemorrhage, it may be subacute. There is no contrast extravasation to suggest active bleeding. In the case of vasculitis, I would expect more inflammatory change, but autoimmune vasculitis or mycotic aneurysm with bacterial infection are considerations. Aortic aneurysm is increased from 3.5 cm to 4.2 cm Electronically Signed   By: Jolaine Click M.D.   On: 08/16/2015 16:22   I have personally reviewed and evaluated these images and lab results as part of my medical decision-making.   EKG Interpretation   Date/Time:  Tuesday August 16 2015 13:32:21 EST Ventricular Rate:  73 PR Interval:  174 QRS Duration: 104 QT Interval:  406 QTC Calculation: 447 R Axis:   50 Text Interpretation:  Sinus rhythm No significant change was found  Confirmed by Sincerity Cedar  MD, Caryn Bee ([id number]) on 08/16/2015 5:48:41 PM      MDM   Final diagnoses:  Abdominal  pain, unspecified abdominal location  AAA (abdominal aortic aneurysm) without rupture (HCC)    5:24 PM Patient with resolution of pain and symptoms at this time.  No tachycardia.  No hypotension.  Patient with abnormal findings around her aortic aneurysm which has a wide differential.  I suspect these are more chronic findings and unrelated to her presentation today.  I will discuss these findings with vascular surgery for their opinion as well.  Doubt ACS.  Asymptomatic at this time.  I suspect she'll need outpatient vascular surgery follow-up as well as outpatient primary care and cardiology follow-up.  Her white blood cell count is normal and therefore I suspect aortitis is less likely as there is significant stranding around the distal aorta either.  I spoke with Dr Myra Gianotti of vascular surgery who will follow up the pt in the office in the next 5-6 days. Pt will call the office in the AM. Images reviewed by Dr Myra Gianotti.   Patient understands return the emergency department for new or worsening symptoms  Azalia Bilis, MD 08/16/15 [phone]

## 2015-08-16 NOTE — Discharge Instructions (Signed)
Abdominal Aortic Aneurysm An aneurysm is a weakened or damaged part of an artery wall that bulges from the normal force of blood pumping through the body. An abdominal aortic aneurysm is an aneurysm that occurs in the lower part of the aorta, the main artery of the body.  The major concern with an abdominal aortic aneurysm is that it can enlarge and burst (rupture) or blood can flow between the layers of the wall of the aorta through a tear (aorticdissection). Both of these conditions can cause bleeding inside the body and can be life threatening unless diagnosed and treated promptly. CAUSES  The exact cause of an abdominal aortic aneurysm is unknown. Some contributing factors are:   A hardening of the arteries caused by the buildup of fat and other substances in the lining of a blood vessel (arteriosclerosis).  Inflammation of the walls of an artery (arteritis).   Connective tissue diseases, such as Marfan syndrome.   Abdominal trauma.   An infection, such as syphilis or staphylococcus, in the wall of the aorta (infectious aortitis) caused by bacteria. RISK FACTORS  Risk factors that contribute to an abdominal aortic aneurysm may include:  Age older than 60 years.   High blood pressure (hypertension).  Female gender.  Ethnicity (white race).  Obesity.  Family history of aneurysm (first degree relatives only).  Tobacco use. PREVENTION  The following healthy lifestyle habits may help decrease your risk of abdominal aortic aneurysm:  Quitting smoking. Smoking can raise your blood pressure and cause arteriosclerosis.  Limiting or avoiding alcohol.  Keeping your blood pressure, blood sugar level, and cholesterol levels within normal limits.  Decreasing your salt intake. In somepeople, too much salt can raise blood pressure and increase your risk of abdominal aortic aneurysm.  Eating a diet low in saturated fats and cholesterol.  Increasing your fiber intake by including  whole grains, vegetables, and fruits in your diet. Eating these foods may help lower blood pressure.  Maintaining a healthy weight.  Staying physically active and exercising regularly. SYMPTOMS  The symptoms of abdominal aortic aneurysm may vary depending on the size and rate of growth of the aneurysm.Most grow slowly and do not have any symptoms. When symptoms do occur, they may include:  Pain (abdomen, side, lower back, or groin). The pain may vary in intensity. A sudden onset of severe pain may indicate that the aneurysm has ruptured.  Feeling full after eating only small amounts of food.  Nausea or vomiting or both.  Feeling a pulsating lump in the abdomen.  Feeling faint or passing out. DIAGNOSIS  Since most unruptured abdominal aortic aneurysms have no symptoms, they are often discovered during diagnostic exams for other conditions. An aneurysm may be found during the following procedures:  Ultrasonography (A one-time screening for abdominal aortic aneurysm by ultrasonography is also recommended for all men aged 65-75 years who have ever smoked).  X-ray exams.  A computed tomography (CT).  Magnetic resonance imaging (MRI).  Angiography or arteriography. TREATMENT  Treatment of an abdominal aortic aneurysm depends on the size of your aneurysm, your age, and risk factors for rupture. Medication to control blood pressure and pain may be used to manage aneurysms smaller than 6 cm. Regular monitoring for enlargement may be recommended by your caregiver if:  The aneurysm is 3-4 cm in size (an annual ultrasonography may be recommended).  The aneurysm is 4-4.5 cm in size (an ultrasonography every 6 months may be recommended).  The aneurysm is larger than 4.5 cm in   size (your caregiver may ask that you be examined by a vascular surgeon). If your aneurysm is larger than 6 cm, surgical repair may be recommended. There are two main methods for repair of an aneurysm:   Endovascular  repair (a minimally invasive surgery). This is done most often.  Open repair. This method is used if an endovascular repair is not possible.   This information is not intended to replace advice given to you by your health care provider. Make sure you discuss any questions you have with your health care provider.   Document Released: 03/07/2005 Document Revised: 09/22/2012 Document Reviewed: 06/27/2012 Elsevier Interactive Patient Education 2016 [employer]  

## 2015-08-18 ENCOUNTER — Other Ambulatory Visit (HOSPITAL_COMMUNITY): Payer: BLUE CROSS/BLUE SHIELD

## 2015-08-18 ENCOUNTER — Telehealth: Payer: Self-pay | Admitting: Surgery

## 2015-08-18 ENCOUNTER — Encounter (HOSPITAL_COMMUNITY): Payer: BLUE CROSS/BLUE SHIELD

## 2015-08-18 LAB — CBC
HEMATOCRIT: 33.6 % — AB (ref 36.0–46.0)
Hemoglobin: 11 g/dL — ABNORMAL LOW (ref 12.0–15.0)
MCH: 30.6 pg (ref 26.0–34.0)
MCHC: 32.7 g/dL (ref 30.0–36.0)
MCV: 93.6 fL (ref 78.0–100.0)
MPV: 10.2 fL (ref 8.6–12.4)
Platelets: 358 10*3/uL (ref 150–400)
RBC: 3.59 MIL/uL — ABNORMAL LOW (ref 3.87–5.11)
RDW: 14 % (ref 11.5–15.5)
WBC: 6.3 10*3/uL (ref 4.0–10.5)

## 2015-08-18 NOTE — Telephone Encounter (Signed)
notified patient of appointment with dr. Myra Gianotti on 08-22-15 at 9:45 am

## 2015-08-19 ENCOUNTER — Encounter: Payer: Self-pay | Admitting: Surgery

## 2015-08-19 ENCOUNTER — Other Ambulatory Visit (HOSPITAL_COMMUNITY): Payer: BLUE CROSS/BLUE SHIELD

## 2015-08-19 LAB — LIPID PANEL
CHOL/HDL RATIO: 4.5 ratio (ref <=5.0)
Cholesterol: 152 mg/dL (ref 125–200)
HDL: 34 mg/dL — AB (ref >=46)
LDL Cholesterol: 76 mg/dL (ref <130)
Triglycerides: 210 mg/dL — ABNORMAL HIGH (ref <150)
VLDL: 42 mg/dL — AB (ref <30)

## 2015-08-19 LAB — COMPREHENSIVE METABOLIC PANEL
ALT: 28 U/L (ref 6–29)
AST: 22 U/L (ref 10–35)
Albumin: 3.6 g/dL (ref 3.6–5.1)
Alkaline Phosphatase: 104 U/L (ref 33–130)
BUN: 9 mg/dL (ref 7–25)
CALCIUM: 9 mg/dL (ref 8.6–10.4)
CHLORIDE: 104 mmol/L (ref 98–110)
CO2: 23 mmol/L (ref 20–31)
Creat: 0.83 mg/dL (ref 0.50–0.99)
GLUCOSE: 81 mg/dL (ref 65–99)
POTASSIUM: 4.2 mmol/L (ref 3.5–5.3)
Sodium: 137 mmol/L (ref 135–146)
Total Bilirubin: 0.6 mg/dL (ref 0.2–1.2)
Total Protein: 7.1 g/dL (ref 6.1–8.1)

## 2015-08-19 LAB — TSH: TSH: 0.7 m[IU]/L

## 2015-08-22 ENCOUNTER — Ambulatory Visit (INDEPENDENT_AMBULATORY_CARE_PROVIDER_SITE_OTHER): Payer: BLUE CROSS/BLUE SHIELD | Admitting: Surgery

## 2015-08-22 ENCOUNTER — Encounter: Payer: Self-pay | Admitting: Surgery

## 2015-08-22 VITALS — BP 110/79 | HR 72 | Temp 96.4°F | Ht 64.0 in | Wt 158.2 lb

## 2015-08-22 DIAGNOSIS — I714 Abdominal aortic aneurysm, without rupture, unspecified: Secondary | ICD-10-CM

## 2015-08-22 NOTE — Progress Notes (Signed)
Patient name: Olivia Wade MRN: [id number] DOB: 02-08-1951 Sex: female   Referred by: Dr. Loney Hering  Reason for referral:  Chief Complaint  Patient presents with  . New Evaluation    eval AAA     HISTORY OF PRESENT ILLNESS: This is a 65 year old female who presented to the [facility] ER last week with abdominal pain, following a stress test.  She had a CT scan which revealed a 4.2cm AAA which had increased from 3.5 cm.  There was inflammation surrounding the AAA.  Since then, her pain has improved, but it still persists.  She denies fevers.  She does complain of decreased energy, but this has been going on for a long time.  She suffers from CAD, and is s/p MI in 2015, treated with stenting x2.  She is on Brilinta.  Her recent stress test was a normal study.  She suffers from COPD, requiring inhalers.  She quit smoking in 2015.  She is on a statin for hypercholesterolemia, and an ACE-I for hypertension.    Past Medical History  Diagnosis Date  . AAA (abdominal aortic aneurysm) (HCC)   . Tobacco abuse   . HTN (hypertension)   . COPD (chronic obstructive pulmonary disease) (HCC)   . Asthma   . HLD (hyperlipidemia)   . GERD (gastroesophageal reflux disease)   . Chronic low back pain     (Old vertebral fracture)  . Arthritis   . Coronary artery disease 09/08/13    with PCI  . NSTEMI (non-ST elevated myocardial infarction) (HCC) 09/08/13  . Heart palpitations     prn toprol for rapid HR  . History of surgery on arm     Past Surgical History  Procedure Laterality Date  . Coronary stent placement  09/08/13    PCI with cutting balloon, PTCA,DES to prox LAD  . Coronary angioplasty with stent placement  09/10/13    PCI of RCA 90% proximal RCA stenosis with DES  . Laparoscopic nissen fundoplication      for severe reflux  . Cholecystectomy    . Abdominal hysterectomy    . 2d echo  12/2012    normal LV with mild LVH, grade 1 diastolic dysfunction  . Doppler echocardiography   12/31/2012  . Nuclear stress test  05/09/2011    NORMAL PATTREN OF PERFUSION IN ALL REGIONS. LV IS NORMAL IS SIZE. EF 68% NO WALL MOTION ABNORMALITIES.  Marland Kitchen Abdominal aortic duplex  05/19/2012    WHEN COMPARED TO PRIOR STUDY DATED 06/01/11 THERE IS AN INCREASE IN SIZE OF DILATATION.  Marland Kitchen Left heart catheterization with coronary angiogram N/A 09/08/2013    Procedure: LEFT HEART CATHETERIZATION WITH CORONARY ANGIOGRAM;  Surgeon: Lennette Bihari, MD;  Location: [facility] CATH LAB;  Service: Cardiovascular;  Laterality: N/A;  . Percutaneous coronary stent intervention (pci-s) N/A 09/10/2013    Procedure: PERCUTANEOUS CORONARY STENT INTERVENTION (PCI-S);  Surgeon: Lennette Bihari, MD;  Location: [facility] CATH LAB;  Service: Cardiovascular;  Laterality: N/A;    Social History   Social History  . Marital Status: Married    Spouse Name: N/A  . Number of Children: N/A  . Years of Education: N/A   Occupational History  . Not on file.   Social History Main Topics  . Smoking status: Former Smoker -- 0.50 packs/day for 37 years    Types: Cigarettes    Quit date: 09/08/2013  . Smokeless tobacco: Never Used  . Alcohol Use: No  . Drug Use:  No  . Sexual Activity: Not on file   Other Topics Concern  . Not on file   Social History Narrative    Family History  Problem Relation Age of Onset  . Heart attack Father   . Cancer Father     Esophageal  . Heart disease Father     before age 34  . Heart attack Son 39  . Heart disease Son     before age 52  . Hypertension Mother     Allergies as of 08/22/2015 - Review Complete 08/22/2015  Allergen Reaction Noted  . Bee venom Anaphylaxis, Hives, and Swelling 09/13/2013  . Penicillins Other (See Comments) 05/26/2013  . Hydrocodone Other (See Comments) 01/20/2014  . Morphine and related Rash 05/26/2013    Current Outpatient Prescriptions on File Prior to Visit  Medication Sig Dispense Refill  . acetaminophen (TYLENOL) 325 MG tablet Take 325 mg by mouth every 6  (six) hours as needed for moderate pain.    Marland Kitchen albuterol (PROVENTIL HFA;VENTOLIN HFA) 108 (90 BASE) MCG/ACT inhaler Inhale 1 puff into the lungs every 6 (six) hours as needed for wheezing or shortness of breath.    Marland Kitchen albuterol (PROVENTIL) (2.5 MG/3ML) 0.083% nebulizer solution Take 2.5 mg by nebulization every 6 (six) hours as needed for wheezing or shortness of breath.    . ALPRAZolam (XANAX) 0.5 MG tablet Take 0.5 mg by mouth 3 (three) times daily as needed for anxiety or sleep. *Patient is prescribed one tablets three times daily as needed for anxiety    . aspirin EC 81 MG tablet Take 81 mg by mouth daily.    Marland Kitchen atorvastatin (LIPITOR) 40 MG tablet Take 1 tablet (40 mg total) by mouth daily. Need appt before anymore refills 30 tablet 0  . citalopram (CELEXA) 20 MG tablet Take 20 mg by mouth daily.    . cyclobenzaprine (FLEXERIL) 10 MG tablet Take 1 tablet (10 mg total) by mouth 3 (three) times daily as needed for muscle spasms. 30 tablet 6  . isosorbide mononitrate (IMDUR) 30 MG 24 hr tablet Take 1 tablet (30 mg total) by mouth 2 (two) times daily. (Patient taking differently: Take 30 mg by mouth daily. ) 60 tablet 1  . lisinopril (PRINIVIL,ZESTRIL) 5 MG tablet take 1 tablet by mouth once daily 30 tablet 10  . MELATONIN PO Take 1 tablet by mouth daily as needed (sleep).    . Menthol, Topical Analgesic, (ICY HOT BACK EX) Apply 1 application topically daily as needed (on back).    . metoprolol succinate (TOPROL-XL) 50 MG 24 hr tablet Take 1 tablet (50 mg total) by mouth 2 (two) times daily. Take with or immediately following a meal. 60 tablet 0  . montelukast (SINGULAIR) 10 MG tablet Take 10 mg by mouth at bedtime.    . nitroGLYCERIN (NITROSTAT) 0.4 MG SL tablet Place 1 tablet (0.4 mg total) under the tongue every 5 (five) minutes as needed for chest pain. 25 tablet 5  . ondansetron (ZOFRAN ODT) 8 MG disintegrating tablet Take 1 tablet (8 mg total) by mouth every 8 (eight) hours as needed for nausea or  vomiting. 12 tablet 0  . pantoprazole (PROTONIX) 40 MG tablet Take 1 tablet (40 mg total) by mouth every morning. 60 tablet 5  . ticagrelor (BRILINTA) 60 MG TABS tablet Take 1 tablet (60 mg total) by mouth 2 (two) times daily. 60 tablet 11   No current facility-administered medications on file prior to visit.     REVIEW OF  SYSTEMS: Cardiovascular: No chest pain. No claudication or rest pain,  No history of DVT or phlebitis. Pulmonary: positive for SOB, positive wheezing Neurologic: No weakness, paresthesias, aphasia, or amaurosis. Positive  dizziness. Hematologic: No bleeding problems or clotting disorders. Musculoskeletal: No joint pain or joint swelling. Gastrointestinal: No blood in stool or hematemesis Genitourinary: No dysuria or hematuria. Psychiatric::  history of major depression. Integumentary: No rashes or ulcers. Constitutional: No fever or chills.  PHYSICAL EXAMINATION:  Filed Vitals:   08/22/15 0855  BP: 110/79  Pulse: 72  Temp: 96.4 F (35.8 C)  TempSrc: Oral  Height: 5\' 4"  (1.626 m)  Weight: 158 lb 3.2 oz (71.759 kg)  SpO2: 96%   Body mass index is 27.14 kg/(m^2). General: The patient appears their stated age.   HEENT:  No gross abnormalities Pulmonary: Respirations are non-labored Abdomen: Soft.  Some tenderness to deep palpation Musculoskeletal: There are no major deformities.   Neurologic: No focal weakness or paresthesias are detected, Skin: There are no ulcer or rashes noted. Psychiatric: The patient has normal affect. Cardiovascular: There is a regular rate and rhythm without significant murmur appreciated.  Palpable DP and PT   Diagnostic Studies: I have reviewed his CT scan with the following findings: No evidence of intramural hematoma or aortic dissection within the thorax.  The appearance of the aorta has changed. There is now a soft tissue rind surrounding the aortic aneurysm with some asymmetry and very little stranding. Differential  diagnosis includes vasculitis, retroperitoneal fibrosis, hemorrhage, or less likely, lymphoma. There is no overt extended retroperitoneal hemorrhage. I suspect that if this does represent hemorrhage, it may be subacute. There is no contrast extravasation to suggest active bleeding. In the case of vasculitis, I would expect more inflammatory change, but autoimmune vasculitis or mycotic aneurysm with bacterial infection are considerations.  Aortic aneurysm is increased from 3.5 cm to 4.2 cm    Assessment:  Abdominal aortic aneurysm Plan: I have reviewed the CT scan with the patient.  She has had an increase in size of her aneurysm, now measuring 4.2 cm with an inflammatory rind surrounding it.  Her symptoms have improved, but had not resolved.  I have contemplated proceeding with endovascular repair however I would like to see if this would resolve on its own.  I'm going to repeat her CT scan in 1 week.  If there has not been significant change we would consider EVAR.  I do not think that this is a mycotic process that she has not been having any signs of sepsis or infection.     Jorge Ny, M.D. [facility] Office: [phone] Pager:  [phone]

## 2015-08-22 NOTE — Addendum Note (Signed)
Addended by: Sharee Pimple on: 08/22/2015 05:01 PM   Modules accepted: Orders

## 2015-08-25 ENCOUNTER — Ambulatory Visit (HOSPITAL_COMMUNITY)
Admission: RE | Admit: 2015-08-25 | Discharge: 2015-08-25 | Disposition: A | Discharge status: Home / Self Care | Payer: BLUE CROSS/BLUE SHIELD | Source: Ambulatory Visit | Attending: Surgery | Admitting: Surgery

## 2015-08-25 ENCOUNTER — Encounter (HOSPITAL_COMMUNITY): Payer: Self-pay

## 2015-08-25 DIAGNOSIS — I714 Abdominal aortic aneurysm, without rupture, unspecified: Secondary | ICD-10-CM

## 2015-08-25 MED ORDER — IOHEXOL 350 MG/ML SOLN
100.0000 mL | Freq: Once | INTRAVENOUS | Status: AC | PRN
  Administered 2015-08-25: 100 mL via INTRAVENOUS

## 2015-08-26 ENCOUNTER — Encounter: Payer: Self-pay | Admitting: *Deleted

## 2015-08-29 ENCOUNTER — Encounter: Payer: Self-pay | Admitting: Surgery

## 2015-08-29 ENCOUNTER — Other Ambulatory Visit: Payer: Self-pay | Admitting: *Deleted

## 2015-08-29 ENCOUNTER — Telehealth: Payer: Self-pay | Admitting: *Deleted

## 2015-08-29 DIAGNOSIS — D510 Vitamin B12 deficiency anemia due to intrinsic factor deficiency: Secondary | ICD-10-CM

## 2015-08-29 NOTE — Telephone Encounter (Signed)
-----   Message from Lennette Bihari, MD sent at 08/29/2015  8:49 AM EDT ----- TG elevated; add otc omega 3 FA; anemic; check stool for heme; iron studies, B12, folate

## 2015-08-29 NOTE — Telephone Encounter (Signed)
Called and notified patient of lab results and recommendation. She voiced verbal understanding. Copies of labs sent to patient's PCP.

## 2015-09-02 ENCOUNTER — Encounter: Payer: Self-pay | Admitting: Surgery

## 2015-09-02 ENCOUNTER — Other Ambulatory Visit: Payer: Self-pay | Admitting: *Deleted

## 2015-09-02 ENCOUNTER — Other Ambulatory Visit: Payer: Self-pay

## 2015-09-02 ENCOUNTER — Ambulatory Visit (INDEPENDENT_AMBULATORY_CARE_PROVIDER_SITE_OTHER): Payer: BLUE CROSS/BLUE SHIELD | Admitting: Surgery

## 2015-09-02 VITALS — BP 107/80 | HR 68 | Temp 96.5°F | Resp 16 | Ht 64.0 in | Wt 154.0 lb

## 2015-09-02 DIAGNOSIS — Z01810 Encounter for preprocedural cardiovascular examination: Secondary | ICD-10-CM | POA: Diagnosis not present

## 2015-09-02 DIAGNOSIS — I714 Abdominal aortic aneurysm, without rupture, unspecified: Secondary | ICD-10-CM

## 2015-09-02 DIAGNOSIS — I6523 Occlusion and stenosis of bilateral carotid arteries: Secondary | ICD-10-CM

## 2015-09-02 MED ORDER — METOPROLOL SUCCINATE ER 50 MG PO TB24: 50.0000 mg | ORAL_TABLET | Freq: Two times a day (BID) | ORAL | Status: DC

## 2015-09-02 NOTE — Addendum Note (Signed)
Addended by: Sharee Pimple on: 09/02/2015 05:44 PM   Modules accepted: Orders

## 2015-09-02 NOTE — Progress Notes (Signed)
Patient name: Olivia Wade MRN: [id number] DOB: [birth year] Sex: female     Chief Complaint  Patient presents with  . AAA    to discuss CT    HISTORY OF PRESENT ILLNESS: This is a 65 year old female who presented to the [facility] ER last week with abdominal pain, following a stress test. She had a CT scan which revealed a 4.2cm AAA which had increased from 3.5 cm. There was inflammation surrounding the AAA. Since then, her pain has improved, but it still persists. She denies fevers. She does complain of decreased energy, but this has been going on for a long time.  She reports that her abdominal pain has improved since I last saw her.  She has had no interval medical changes.  She denies any fevers or chills.  Past Medical History  Diagnosis Date  . AAA (abdominal aortic aneurysm) (HCC)   . Tobacco abuse   . HTN (hypertension)   . COPD (chronic obstructive pulmonary disease) (HCC)   . Asthma   . HLD (hyperlipidemia)   . GERD (gastroesophageal reflux disease)   . Chronic low back pain     (Old vertebral fracture)  . Arthritis   . Coronary artery disease 09/08/13    with PCI  . NSTEMI (non-ST elevated myocardial infarction) (HCC) 09/08/13  . Heart palpitations     prn toprol for rapid HR  . History of surgery on arm     Past Surgical History  Procedure Laterality Date  . Coronary stent placement  09/08/13    PCI with cutting balloon, PTCA,DES to prox LAD  . Coronary angioplasty with stent placement  09/10/13    PCI of RCA 90% proximal RCA stenosis with DES  . Laparoscopic nissen fundoplication      for severe reflux  . Cholecystectomy    . Abdominal hysterectomy    . 2d echo  12/2012    normal LV with mild LVH, grade 1 diastolic dysfunction  . Doppler echocardiography  12/31/2012  . Nuclear stress test  05/09/2011    NORMAL PATTREN OF PERFUSION IN ALL REGIONS. LV IS NORMAL IS SIZE. EF 68% NO WALL MOTION ABNORMALITIES.  Marland Kitchen Abdominal aortic duplex  05/19/2012   WHEN COMPARED TO PRIOR STUDY DATED 06/01/11 THERE IS AN INCREASE IN SIZE OF DILATATION.  Marland Kitchen Left heart catheterization with coronary angiogram N/A 09/08/2013    Procedure: LEFT HEART CATHETERIZATION WITH CORONARY ANGIOGRAM;  Surgeon: Lennette Bihari, MD;  Location: [facility] CATH LAB;  Service: Cardiovascular;  Laterality: N/A;  . Percutaneous coronary stent intervention (pci-s) N/A 09/10/2013    Procedure: PERCUTANEOUS CORONARY STENT INTERVENTION (PCI-S);  Surgeon: Lennette Bihari, MD;  Location: [facility] CATH LAB;  Service: Cardiovascular;  Laterality: N/A;    Social History   Social History  . Marital Status: Married    Spouse Name: N/A  . Number of Children: N/A  . Years of Education: N/A   Occupational History  . Not on file.   Social History Main Topics  . Smoking status: Former Smoker -- 0.50 packs/day for 37 years    Types: Cigarettes    Quit date: 09/08/2013  . Smokeless tobacco: Never Used  . Alcohol Use: No  . Drug Use: No  . Sexual Activity: Not on file   Other Topics Concern  . Not on file   Social History Narrative    Family History  Problem Relation Age of Onset  . Heart attack Father   . Cancer  Father     Esophageal  . Heart disease Father     before age 73  . Heart attack Son 38  . Heart disease Son     before age 23  . Hypertension Mother     Allergies as of 09/02/2015 - Review Complete 09/02/2015  Allergen Reaction Noted  . Bee venom Anaphylaxis, Hives, and Swelling 09/13/2013  . Penicillins Other (See Comments) 05/26/2013  . Hydrocodone Other (See Comments) 01/20/2014  . Morphine and related Rash 05/26/2013    Current Outpatient Prescriptions on File Prior to Visit  Medication Sig Dispense Refill  . acetaminophen (TYLENOL) 325 MG tablet Take 325 mg by mouth every 6 (six) hours as needed for moderate pain.    Marland Kitchen albuterol (PROVENTIL HFA;VENTOLIN HFA) 108 (90 BASE) MCG/ACT inhaler Inhale 1 puff into the lungs every 6 (six) hours as needed for wheezing or  shortness of breath.    Marland Kitchen albuterol (PROVENTIL) (2.5 MG/3ML) 0.083% nebulizer solution Take 2.5 mg by nebulization every 6 (six) hours as needed for wheezing or shortness of breath.    . ALPRAZolam (XANAX) 0.5 MG tablet Take 0.5 mg by mouth 3 (three) times daily as needed for anxiety or sleep. *Patient is prescribed one tablets three times daily as needed for anxiety    . aspirin EC 81 MG tablet Take 81 mg by mouth daily.    Marland Kitchen atorvastatin (LIPITOR) 40 MG tablet Take 1 tablet (40 mg total) by mouth daily. Need appt before anymore refills 30 tablet 0  . citalopram (CELEXA) 20 MG tablet Take 20 mg by mouth daily.    . cyclobenzaprine (FLEXERIL) 10 MG tablet Take 1 tablet (10 mg total) by mouth 3 (three) times daily as needed for muscle spasms. 30 tablet 6  . isosorbide mononitrate (IMDUR) 30 MG 24 hr tablet Take 1 tablet (30 mg total) by mouth 2 (two) times daily. (Patient taking differently: Take 30 mg by mouth daily. ) 60 tablet 1  . lisinopril (PRINIVIL,ZESTRIL) 5 MG tablet take 1 tablet by mouth once daily 30 tablet 10  . MELATONIN PO Take 1 tablet by mouth daily as needed (sleep).    . Menthol, Topical Analgesic, (ICY HOT BACK EX) Apply 1 application topically daily as needed (on back).    . montelukast (SINGULAIR) 10 MG tablet Take 10 mg by mouth at bedtime.    . nitroGLYCERIN (NITROSTAT) 0.4 MG SL tablet Place 1 tablet (0.4 mg total) under the tongue every 5 (five) minutes as needed for chest pain. 25 tablet 5  . ondansetron (ZOFRAN ODT) 8 MG disintegrating tablet Take 1 tablet (8 mg total) by mouth every 8 (eight) hours as needed for nausea or vomiting. 12 tablet 0  . pantoprazole (PROTONIX) 40 MG tablet Take 1 tablet (40 mg total) by mouth every morning. 60 tablet 5  . ticagrelor (BRILINTA) 60 MG TABS tablet Take 1 tablet (60 mg total) by mouth 2 (two) times daily. 60 tablet 11   No current facility-administered medications on file prior to visit.     REVIEW OF SYSTEMS: No change from  visit 1 week ago except what is listed in the history of present illness   PHYSICAL EXAMINATION:   Vital signs are  Filed Vitals:   09/02/15 1539  BP: 107/80  Pulse: 68  Temp: 96.5 F (35.8 C)  Resp: 16  Height: 5\' 4"  (1.626 m)  Weight: 154 lb (69.854 kg)  SpO2: 97%   Body mass index is 26.42 kg/(m^2). General: The patient  appears their stated age. HEENT:  No gross abnormalities Pulmonary:  Non labored breathing Abdomen: Abdomen is tender to deep palpation Musculoskeletal: There are no major deformities. Neurologic: No focal weakness or paresthesias are detected, Skin: There are no ulcer or rashes noted. Psychiatric: The patient has normal affect. Cardiovascular: There is a regular rate and rhythm without significant murmur appreciated.  Palpable pedal pulses   Diagnostic Studies I have reviewed her CT anterior.  There appears to be no interval changes.  However, the radiology report is that the aneurysm measures 5.5 cm.  I think the native aorta is 4.2 with inflammatory changes outside of the aortic wall making the diameter 5.5.  Assessment: Inflammatory aneurysm Plan: I discussed with the patient since she is getting better we will continue to monitor her progress.  I have a low threshold to repair her aneurysm.  I am going to have her follow-up in one month with a repeat CT scan.  If she continues to have any kind of abdominal pain we will plan on scheduling her for endovascular aneurysm repair.  She will contact me if she has any deterioration in her symptoms.  Jorge Ny, M.D. [facility] [place] Office: [phone] Pager:  [phone]

## 2015-09-06 ENCOUNTER — Other Ambulatory Visit: Payer: Self-pay

## 2015-09-06 MED ORDER — METOPROLOL SUCCINATE ER 50 MG PO TB24: 50.0000 mg | ORAL_TABLET | Freq: Two times a day (BID) | ORAL | Status: DC

## 2015-09-06 NOTE — Telephone Encounter (Signed)
Last rx was printed rx sent electronically to pharmacy.

## 2015-09-07 ENCOUNTER — Other Ambulatory Visit: Payer: Self-pay | Admitting: *Deleted

## 2015-09-07 MED ORDER — ATORVASTATIN CALCIUM 40 MG PO TABS: 40.0000 mg | ORAL_TABLET | Freq: Every day | ORAL | Status: DC

## 2015-09-08 ENCOUNTER — Ambulatory Visit (HOSPITAL_COMMUNITY)
Admission: RE | Admit: 2015-09-08 | Discharge: 2015-09-08 | Disposition: A | Discharge status: Home / Self Care | Payer: BLUE CROSS/BLUE SHIELD | Source: Ambulatory Visit | Attending: Vascular Surgery | Admitting: Vascular Surgery

## 2015-09-08 ENCOUNTER — Ambulatory Visit (INDEPENDENT_AMBULATORY_CARE_PROVIDER_SITE_OTHER)
Admission: RE | Admit: 2015-09-08 | Discharge: 2015-09-08 | Disposition: A | Discharge status: Home / Self Care | Payer: BLUE CROSS/BLUE SHIELD | Source: Ambulatory Visit | Attending: Surgery | Admitting: Surgery

## 2015-09-08 ENCOUNTER — Telehealth: Payer: Self-pay

## 2015-09-08 DIAGNOSIS — Z720 Tobacco use: Secondary | ICD-10-CM | POA: Diagnosis not present

## 2015-09-08 DIAGNOSIS — Z01810 Encounter for preprocedural cardiovascular examination: Secondary | ICD-10-CM

## 2015-09-08 DIAGNOSIS — I10 Essential (primary) hypertension: Secondary | ICD-10-CM | POA: Diagnosis not present

## 2015-09-08 DIAGNOSIS — K219 Gastro-esophageal reflux disease without esophagitis: Secondary | ICD-10-CM | POA: Insufficient documentation

## 2015-09-08 DIAGNOSIS — E785 Hyperlipidemia, unspecified: Secondary | ICD-10-CM | POA: Diagnosis not present

## 2015-09-08 DIAGNOSIS — I714 Abdominal aortic aneurysm, without rupture, unspecified: Secondary | ICD-10-CM

## 2015-09-08 DIAGNOSIS — R0989 Other specified symptoms and signs involving the circulatory and respiratory systems: Secondary | ICD-10-CM | POA: Diagnosis present

## 2015-09-08 NOTE — Telephone Encounter (Signed)
Pt. Here for pre-operative vascular labs; lower extrem. Art. Duplex and Carotid Duplex.  Notified by Vascular Tech that pt. reported increase in abdominal pain since early this morning.  Nurse triaged pt; reported she noticed increased pain in left side of abdomen, "right where my aneurysm is" in middle of the night.  Reported it felt like a constant pinching.  Also reported onset of nausea this morning.  Denied any back pain.  Discussed with Dr. Darrick Penna.  Recommended pt. To contact her PCP; stated with the aneurysm of 4 cm., it is unlikely that this is her aneurysm. Stated there can be multiple causes for abdominal pain.  Nurse advised to pt. To contact her PCP, or if pain worsens, to go to the ER.  Informed pt. That Dr. Myra Gianotti will be made aware of complaints. Verb. Understanding.

## 2015-09-09 ENCOUNTER — Telehealth: Payer: Self-pay | Admitting: Cardiovascular Disease

## 2015-09-09 NOTE — Telephone Encounter (Signed)
Calling to find out where she will get the stool card and cause they do not have them , because they cant process them. Can the cards be mailed out to her. Please call   Thanks

## 2015-09-10 LAB — IRON AND TIBC
%SAT: 11 % (ref 11–50)
IRON: 32 ug/dL — AB (ref 45–160)
TIBC: 285 ug/dL (ref 250–450)
UIBC: 253 ug/dL (ref 125–400)

## 2015-09-10 LAB — VITAMIN B12: VITAMIN B 12: 283 pg/mL (ref 200–1100)

## 2015-09-10 LAB — FOLATE: FOLATE: 3.6 ng/mL — AB (ref >5.4)

## 2015-09-12 ENCOUNTER — Other Ambulatory Visit: Payer: Self-pay | Admitting: *Deleted

## 2015-09-12 DIAGNOSIS — D510 Vitamin B12 deficiency anemia due to intrinsic factor deficiency: Secondary | ICD-10-CM

## 2015-09-12 DIAGNOSIS — R899 Unspecified abnormal finding in specimens from other organs, systems and tissues: Secondary | ICD-10-CM

## 2015-09-12 MED ORDER — POLYSACCHARIDE IRON COMPLEX 150 MG PO CAPS: 150.0000 mg | ORAL_CAPSULE | Freq: Every day | ORAL | Status: DC

## 2015-09-12 MED ORDER — FOLIC ACID 1 MG PO TABS: 1.0000 mg | ORAL_TABLET | Freq: Every day | ORAL | Status: DC

## 2015-09-12 NOTE — Telephone Encounter (Signed)
-----   Message from Lennette Bihari, MD sent at 09/10/2015  2:44 PM EDT ----- Check stool guiacs with low fe sat; start niferex or ferrous sulfate;  Low folate; add folate 1 mg.  F/u cbc folate and ferritin level in 1 month

## 2015-09-12 NOTE — Telephone Encounter (Signed)
Informed patient of lab results and recommendations. °

## 2015-09-28 ENCOUNTER — Encounter (HOSPITAL_COMMUNITY): Payer: BLUE CROSS/BLUE SHIELD

## 2015-10-07 ENCOUNTER — Encounter: Payer: Self-pay | Admitting: Surgery

## 2015-10-10 ENCOUNTER — Other Ambulatory Visit: Payer: Self-pay

## 2015-10-10 ENCOUNTER — Other Ambulatory Visit (HOSPITAL_COMMUNITY): Payer: BLUE CROSS/BLUE SHIELD

## 2015-10-10 MED ORDER — ATORVASTATIN CALCIUM 40 MG PO TABS: 40.0000 mg | ORAL_TABLET | Freq: Every day | ORAL | Status: DC

## 2015-10-17 ENCOUNTER — Ambulatory Visit (INDEPENDENT_AMBULATORY_CARE_PROVIDER_SITE_OTHER): Payer: BLUE CROSS/BLUE SHIELD | Admitting: Surgery

## 2015-10-17 ENCOUNTER — Encounter: Payer: Self-pay | Admitting: Surgery

## 2015-10-17 VITALS — BP 109/79 | HR 63 | Ht 64.0 in | Wt 155.3 lb

## 2015-10-17 DIAGNOSIS — I714 Abdominal aortic aneurysm, without rupture, unspecified: Secondary | ICD-10-CM

## 2015-10-17 NOTE — Progress Notes (Signed)
[facility] [place]  Patient name: Olivia Wade MRN: [id number] DOB: Jun 21, 1950 Sex: female  REASON FOR VISIT: follow up  HPI: This is a 65 year old female who presented to the [facility] ER in March 2017 with abdominal pain, following a stress test. She had a CT scan which revealed a 4.2cm AAA which had increased from 3.5 cm. There was inflammation surrounding the AAA. Since then, her pain has improved, but it still persists. She denies fevers. She does complain of decreased energy, but this has been going on for a long time.  She suffers from CAD, and is s/p MI in 2015, treated with stenting x2. She is on Brilinta. Her recent stress test was a normal study. She suffers from COPD, requiring inhalers. She quit smoking in 2015. She is on a statin for hypercholesterolemia, and an ACE-I for hypertension.  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  The pain appeared to be getting better, however she is back today stating that it still persists.  Past Medical History  Diagnosis Date  . AAA (abdominal aortic aneurysm) (HCC)   . Tobacco abuse   . HTN (hypertension)   . COPD (chronic obstructive pulmonary disease) (HCC)   . Asthma   . HLD (hyperlipidemia)   . GERD (gastroesophageal reflux disease)   . Chronic low back pain     (Old vertebral fracture)  . Arthritis   . Coronary artery disease 09/08/13    with PCI  . NSTEMI (non-ST elevated myocardial infarction) (HCC) 09/08/13  . Heart palpitations     prn toprol for rapid HR  . History of surgery on arm     Family History  Problem Relation Age of Onset  . Heart attack Father   . Cancer Father     Esophageal  . Heart disease Father     before age 65  . Heart attack Son 75  . Heart disease Son     before age 55  . Hypertension Mother     SOCIAL HISTORY: Social History  Substance Use Topics  . Smoking status: Former Smoker -- 0.50 packs/day  for 37 years    Types: Cigarettes    Quit date: 09/08/2013  . Smokeless tobacco: Never Used  . Alcohol Use: No    Allergies  Allergen Reactions  . Bee Venom Anaphylaxis, Hives and Swelling  . Penicillins Other (See Comments)    PATIENT REPORTS "MOTHER TOLD HER SHE HAD ALLERGY TO PCN"  . Hydrocodone Other (See Comments)  . Morphine And Related Rash    Current Outpatient Prescriptions  Medication Sig Dispense Refill  . acetaminophen (TYLENOL) 325 MG tablet Take 325 mg by mouth every 6 (six) hours as needed for moderate pain.    Marland Kitchen albuterol (PROVENTIL HFA;VENTOLIN HFA) 108 (90 BASE) MCG/ACT inhaler Inhale 1 puff into the lungs every 6 (six) hours as needed for wheezing or shortness of breath.    Marland Kitchen albuterol (PROVENTIL) (2.5 MG/3ML) 0.083% nebulizer solution Take 2.5 mg by nebulization every 6 (six) hours as needed for wheezing or shortness of breath.    . ALPRAZolam (XANAX) 0.5 MG tablet Take 0.5 mg by mouth 3 (three) times daily as needed for anxiety or sleep. *Patient is prescribed one tablets three times daily as needed for anxiety    . aspirin EC 81 MG tablet Take 81 mg by mouth daily.    Marland Kitchen atorvastatin (LIPITOR) 40 MG tablet Take 1  tablet (40 mg total) by mouth daily. KEEP OV. 30 tablet 0  . citalopram (CELEXA) 20 MG tablet Take 20 mg by mouth daily.    . cyclobenzaprine (FLEXERIL) 10 MG tablet Take 1 tablet (10 mg total) by mouth 3 (three) times daily as needed for muscle spasms. 30 tablet 6  . folic acid (FOLVITE) 1 MG tablet Take 1 tablet (1 mg total) by mouth daily. 30 tablet 2  . iron polysaccharides (NIFEREX) 150 MG capsule Take 1 capsule (150 mg total) by mouth daily. 30 capsule 2  . isosorbide mononitrate (IMDUR) 30 MG 24 hr tablet Take 1 tablet (30 mg total) by mouth 2 (two) times daily. (Patient taking differently: Take 30 mg by mouth daily. ) 60 tablet 1  . lisinopril (PRINIVIL,ZESTRIL) 5 MG tablet take 1 tablet by mouth once daily 30 tablet 10  . MELATONIN PO Take 1 tablet  by mouth daily as needed (sleep).    . Menthol, Topical Analgesic, (ICY HOT BACK EX) Apply 1 application topically daily as needed (on back).    . metoprolol succinate (TOPROL-XL) 50 MG 24 hr tablet Take 1 tablet (50 mg total) by mouth 2 (two) times daily. PLEASE CONTACT OFFICE FOR ADDITIONAL REFILLS 60 tablet 1  . montelukast (SINGULAIR) 10 MG tablet Take 10 mg by mouth at bedtime.    . nitroGLYCERIN (NITROSTAT) 0.4 MG SL tablet Place 1 tablet (0.4 mg total) under the tongue every 5 (five) minutes as needed for chest pain. 25 tablet 5  . ondansetron (ZOFRAN ODT) 8 MG disintegrating tablet Take 1 tablet (8 mg total) by mouth every 8 (eight) hours as needed for nausea or vomiting. 12 tablet 0  . pantoprazole (PROTONIX) 40 MG tablet Take 1 tablet (40 mg total) by mouth every morning. 60 tablet 5  . ticagrelor (BRILINTA) 60 MG TABS tablet Take 1 tablet (60 mg total) by mouth 2 (two) times daily. 60 tablet 11   No current facility-administered medications for this visit.    REVIEW OF SYSTEMS:  [X]  denotes positive finding, [ ]  denotes negative finding Cardiac  Comments:  Chest pain or chest pressure:    Shortness of breath upon exertion:    Short of breath when lying flat:    Irregular heart rhythm:        Vascular    Pain in calf, thigh, or hip brought on by ambulation:    Pain in feet at night that wakes you up from your sleep:     Blood clot in your veins:    Leg swelling:         Pulmonary    Oxygen at home:    Productive cough:     Wheezing:         Neurologic    Sudden weakness in arms or legs:     Sudden numbness in arms or legs:     Sudden onset of difficulty speaking or slurred speech:    Temporary loss of vision in one eye:     Problems with dizziness:         Gastrointestinal    Blood in stool:     Vomited blood:     Abdominal pain     Genitourinary    Burning when urinating:     Blood in urine:        Psychiatric    Major depression:         Hematologic      Bleeding problems:    Problems with blood  clotting too easily:        Skin    Rashes or ulcers:        Constitutional    Fever or chills:      PHYSICAL EXAM: Filed Vitals:   10/17/15 1556  BP: 109/79  Pulse: 63  Height:  (1.626 m)  Weight: 155 lb 4.8 oz (70.444 kg)  SpO2: 99%    GENERAL: The patient is a well-nourished female, in no acute distress. The vital signs are documented above. CARDIAC: There is a regular rate and rhythm.  VASCULAR: Palpable pedal pulses PULMONARY: There is good air exchange bilaterally without wheezing or rales. ABDOMEN: Significant tenderness to mild palpation MUSCULOSKELETAL: There are no major deformities or cyanosis. NEUROLOGIC: No focal weakness or paresthesias are detected. SKIN: There are no ulcers or rashes noted. PSYCHIATRIC: The patient has a normal affect.  DATA:  I have ordered and reviewed the following vascular lab studies Lower extremity Doppler: No evidence of popliteal aneurysm Carotid duplex: Minimal carotid stenosis bilaterally  MEDICAL ISSUES: Inflammatory abdominal aortic aneurysm: The patient's symptoms have not resolved.  For that reason, we have discussed proceeding with endovascular repair.  I discussed the risks and benefits which include but are not limited to the risk of cardiopulmonary complications, intestinal ischemia, lower extremity ischemia, renal dysfunction, access site complications including bleeding, and the fact that her symptoms may not resolve immediately or completely.  She is a patient of Dr. Tresa Endo.  I'm going to get a formal cardiology clearance.  She will also need to be off of her Brilinta.  Her procedure is scheduled for Thursday, May 25    [name] [facility] [place] Beeper: [phone]

## 2015-10-20 ENCOUNTER — Other Ambulatory Visit: Payer: Self-pay

## 2015-10-20 ENCOUNTER — Ambulatory Visit (INDEPENDENT_AMBULATORY_CARE_PROVIDER_SITE_OTHER): Payer: BLUE CROSS/BLUE SHIELD | Admitting: Cardiovascular Disease

## 2015-10-20 ENCOUNTER — Encounter: Payer: Self-pay | Admitting: Cardiovascular Disease

## 2015-10-20 ENCOUNTER — Telehealth: Payer: Self-pay | Admitting: Surgery

## 2015-10-20 VITALS — BP 110/80 | HR 79 | Ht 63.0 in | Wt 155.0 lb

## 2015-10-20 DIAGNOSIS — I10 Essential (primary) hypertension: Secondary | ICD-10-CM

## 2015-10-20 DIAGNOSIS — Z955 Presence of coronary angioplasty implant and graft: Secondary | ICD-10-CM

## 2015-10-20 DIAGNOSIS — I25118 Atherosclerotic heart disease of native coronary artery with other forms of angina pectoris: Secondary | ICD-10-CM | POA: Diagnosis not present

## 2015-10-20 DIAGNOSIS — I714 Abdominal aortic aneurysm, without rupture, unspecified: Secondary | ICD-10-CM

## 2015-10-20 DIAGNOSIS — Z01818 Encounter for other preprocedural examination: Secondary | ICD-10-CM | POA: Diagnosis not present

## 2015-10-20 DIAGNOSIS — E785 Hyperlipidemia, unspecified: Secondary | ICD-10-CM

## 2015-10-20 NOTE — Telephone Encounter (Signed)
Sched appts 5/19; cta at [facility] [phone] at 10:00 and md at 12:00. Lm on hm# to inform pt.

## 2015-10-20 NOTE — Telephone Encounter (Signed)
-----   Message from Phillips Odor, RN sent at 10/20/2015  3:58 PM EDT ----- Regarding: needs CTA and office appt. with [facility] This pt. needs CTA chest, Abd, and Pelvis with 1 mm cuts and office appt with Dr. Myra Gianotti on Friday, 5/19. (she is being prepared for a FEVAR)

## 2015-10-20 NOTE — Patient Instructions (Signed)
Continue all current medications. Your physician wants you to follow up in: 6 months.  You will receive a reminder letter in the mail one-two months in advance.  If you don't receive a letter, please call our office to schedule the follow up appointment   

## 2015-10-20 NOTE — Progress Notes (Signed)
Patient ID: Olivia Wade, female   DOB: June 07, 1951, 65 y.o.   MRN: [id number]      SUBJECTIVE: The patient is a 65 year old woman who saw Dr. Tresa Endo in February. She has a history of coronary artery disease with prior stenting of the LAD and RCA with drug-eluting stents in late March and early April 2015, respectively. She has a 37 year history of tobacco abuse but quit in March 2015. She also has COPD, severe GERD status post Nissen fundoplication, and an abdominal aortic aneurysm.  CT scan showed 5.5 cm aneurysm with surrounding inflammation (08/25/15). She saw Dr. Myra Gianotti (5/8) who plans for endovascular repair 5/25.  She recently underwent a normal nuclear stress test 08/16/15, calculated LVEF 55-65%.  Echocardiogram 08/16/15 normal left ventricular systolic function and regional wall motion, EF 60-65%, mild LVH, with grade 1 diastolic dysfunction.  She denies chest pain. She has chronic exertional dyspnea from COPD which is stable. She has a burning pain in her epigastrium which is tender when she puts pants on.   Review of Systems: As per "subjective", otherwise negative.  Allergies  Allergen Reactions  . Bee Venom Anaphylaxis, Hives and Swelling  . Penicillins Other (See Comments)    PATIENT REPORTS "MOTHER TOLD HER SHE HAD ALLERGY TO PCN"  . Hydrocodone Other (See Comments)  . Morphine And Related Rash    Current Outpatient Prescriptions  Medication Sig Dispense Refill  . acetaminophen (TYLENOL) 325 MG tablet Take 325 mg by mouth every 6 (six) hours as needed for moderate pain.    Marland Kitchen albuterol (PROVENTIL HFA;VENTOLIN HFA) 108 (90 BASE) MCG/ACT inhaler Inhale 1 puff into the lungs every 6 (six) hours as needed for wheezing or shortness of breath.    Marland Kitchen albuterol (PROVENTIL) (2.5 MG/3ML) 0.083% nebulizer solution Take 2.5 mg by nebulization every 6 (six) hours as needed for wheezing or shortness of breath.    . ALPRAZolam (XANAX) 0.5 MG tablet Take 0.5 mg by mouth 3 (three) times daily  as needed for anxiety or sleep. *Patient is prescribed one tablets three times daily as needed for anxiety    . aspirin EC 81 MG tablet Take 81 mg by mouth daily.    Marland Kitchen atorvastatin (LIPITOR) 40 MG tablet Take 1 tablet (40 mg total) by mouth daily. KEEP OV. 30 tablet 0  . citalopram (CELEXA) 20 MG tablet Take 20 mg by mouth daily.    . cyclobenzaprine (FLEXERIL) 10 MG tablet Take 1 tablet (10 mg total) by mouth 3 (three) times daily as needed for muscle spasms. 30 tablet 6  . folic acid (FOLVITE) 1 MG tablet Take 1 tablet (1 mg total) by mouth daily. 30 tablet 2  . iron polysaccharides (NIFEREX) 150 MG capsule Take 1 capsule (150 mg total) by mouth daily. 30 capsule 2  . isosorbide mononitrate (IMDUR) 30 MG 24 hr tablet Take 30 mg by mouth daily.    Marland Kitchen lisinopril (PRINIVIL,ZESTRIL) 5 MG tablet take 1 tablet by mouth once daily 30 tablet 10  . MELATONIN PO Take 1 tablet by mouth daily as needed (sleep).    . Menthol, Topical Analgesic, (ICY HOT BACK EX) Apply 1 application topically daily as needed (on back).    . metoprolol succinate (TOPROL-XL) 50 MG 24 hr tablet Take 1 tablet (50 mg total) by mouth 2 (two) times daily. PLEASE CONTACT OFFICE FOR ADDITIONAL REFILLS 60 tablet 1  . montelukast (SINGULAIR) 10 MG tablet Take 10 mg by mouth at bedtime.    . nitroGLYCERIN (NITROSTAT)  0.4 MG SL tablet Place 1 tablet (0.4 mg total) under the tongue every 5 (five) minutes as needed for chest pain. 25 tablet 5  . Omega-3 Fatty Acids (FISH OIL EXTRA STRENGTH PO) Take by mouth.    . pantoprazole (PROTONIX) 40 MG tablet Take 1 tablet (40 mg total) by mouth every morning. 60 tablet 5  . ticagrelor (BRILINTA) 60 MG TABS tablet Take 1 tablet (60 mg total) by mouth 2 (two) times daily. 60 tablet 11   No current facility-administered medications for this visit.    Past Medical History  Diagnosis Date  . AAA (abdominal aortic aneurysm) (HCC)   . Tobacco abuse   . HTN (hypertension)   . COPD (chronic obstructive  pulmonary disease) (HCC)   . Asthma   . HLD (hyperlipidemia)   . GERD (gastroesophageal reflux disease)   . Chronic low back pain     (Old vertebral fracture)  . Arthritis   . Coronary artery disease 09/08/13    with PCI  . NSTEMI (non-ST elevated myocardial infarction) (HCC) 09/08/13  . Heart palpitations     prn toprol for rapid HR  . History of surgery on arm     Past Surgical History  Procedure Laterality Date  . Coronary stent placement  09/08/13    PCI with cutting balloon, PTCA,DES to prox LAD  . Coronary angioplasty with stent placement  09/10/13    PCI of RCA 90% proximal RCA stenosis with DES  . Laparoscopic nissen fundoplication      for severe reflux  . Cholecystectomy    . Abdominal hysterectomy    . 2d echo  12/2012    normal LV with mild LVH, grade 1 diastolic dysfunction  . Doppler echocardiography  12/31/2012  . Nuclear stress test  05/09/2011    NORMAL PATTREN OF PERFUSION IN ALL REGIONS. LV IS NORMAL IS SIZE. EF 68% NO WALL MOTION ABNORMALITIES.  Marland Kitchen Abdominal aortic duplex  05/19/2012    WHEN COMPARED TO PRIOR STUDY DATED 06/01/11 THERE IS AN INCREASE IN SIZE OF DILATATION.  Marland Kitchen Left heart catheterization with coronary angiogram N/A 09/08/2013    Procedure: LEFT HEART CATHETERIZATION WITH CORONARY ANGIOGRAM;  Surgeon: Lennette Bihari, MD;  Location[facility] C[facility] LAB;  Service: Cardiovascular;  Laterality: N/A;  . Percutaneous coronary stent intervention (pci-s) N/A 09/10/2013    Procedure: PERCUTANEOUS CORONARY STENT INTERVENTION (PCI-S);  Surgeon: Lennette Bihari, MD;  Location: [facility] CATH LAB;  Service: Cardiovascular;  Laterality: N/A;    Social History   Social History  . Marital Status: Married    Spouse Name: N/A  . Number of Children: N/A  . Years of Education: N/A   Occupational History  . Not on file.   Social History Main Topics  . Smoking status: Former Smoker -- 0.50 packs/day for 37 years    Types: Cigarettes    Quit date: 09/08/2013  . Smokeless  tobacco: Never Used  . Alcohol Use: No  . Drug Use: No  . Sexual Activity: Not on file   Other Topics Concern  . Not on file   Social History Narrative     Filed Vitals:   10/20/15 1443  BP: 110/80  Pulse: 79  Height: 5\' 3"  (1.6 m)  Weight: 155 lb (70.308 kg)  SpO2: 98%    PHYSICAL EXAM General: NAD HEENT: Normal. Neck: No JVD, no thyromegaly. Lungs: Clear to auscultation bilaterally with normal respiratory effort. CV: Nondisplaced PMI.  Regular rate and rhythm, normal S1/S2, no S3/S4,  no murmur. No pretibial or periankle edema.  No carotid bruit.   Abdomen: Soft, no distention.  Neurologic: Alert and oriented.  Psych: Normal affect. Skin: Normal. Musculoskeletal: No gross deformities.    ECG: Most recent ECG reviewed.      ASSESSMENT AND PLAN: 1. CAD with LAD and RCA stents: Symptomatically stable. Normal nuclear stress test as noted above with normal LV systolic function. Currently taking aspirin 81 mg, Brilinta 60 mg twice daily, Toprol-XL 50 mg twice daily, lisinopril 5 mg daily, Lipitor 40 mg, and Imdur 30 mg daily.  2. Essential HTN: Controlled. No changes.  3. Hyperlipidemia: Continue Lipitor 40 mg.  4. AAA: EVAR planned by vascular surgery. Continue ASA.  5. Preoperative risk stratification: Given her normal nuclear stress test but multiple comorbidities, she is likely at a low to intermediate risk for major adverse cardiac events in the perioperative period. If possible, would operate on ASA. Brilinta can be held 5 days prior to surgery.  Dispo: fu 6 months.   Prentice Docker, M.D., F.A.C.C.

## 2015-10-21 ENCOUNTER — Encounter: Payer: Self-pay | Admitting: Surgery

## 2015-10-28 ENCOUNTER — Ambulatory Visit
Admission: RE | Admit: 2015-10-28 | Discharge: 2015-10-28 | Disposition: A | Discharge status: Home / Self Care | Payer: BLUE CROSS/BLUE SHIELD | Source: Ambulatory Visit | Attending: Surgery | Admitting: Surgery

## 2015-10-28 ENCOUNTER — Ambulatory Visit: Payer: BLUE CROSS/BLUE SHIELD | Admitting: Surgery

## 2015-10-28 ENCOUNTER — Encounter: Payer: Self-pay | Admitting: Surgery

## 2015-10-28 ENCOUNTER — Ambulatory Visit (INDEPENDENT_AMBULATORY_CARE_PROVIDER_SITE_OTHER): Payer: BLUE CROSS/BLUE SHIELD | Admitting: Surgery

## 2015-10-28 VITALS — BP 110/75 | HR 68 | Temp 97.4°F | Resp 16 | Ht 64.0 in | Wt 154.0 lb

## 2015-10-28 DIAGNOSIS — I714 Abdominal aortic aneurysm, without rupture, unspecified: Secondary | ICD-10-CM

## 2015-10-28 IMAGING — CT CT CTA ABD/PEL W/CM AND/OR W/O CM
1 of 9 series · 12 of 46 positions shown, 17 images · IV contrast (75CC ISOVUE 370)
Comparison: [DATE]

CLINICAL DATA: Abdominal aortic aneurysm

EXAM:
CTA CHEST ABDOMEN AND PELVIS wITHOUT AND WITH CONTRAST
TECHNIQUE: Multidetector CT imaging of the chest, abdomen and pelvis was
performed using the standard protocol during bolus administration of
intravenous contrast. Multiplanar reconstructed images and MIPs were
obtained and reviewed to evaluate the vascular anatomy.
CONTRAST:  75 cc Isovue 370

[Series 3: cta c,a,p · axial · 0.74mm/px · z∈[-565,-55]mm · 12 of 927 slices shown, 17 images]
[im 55/927  soft-tissue]
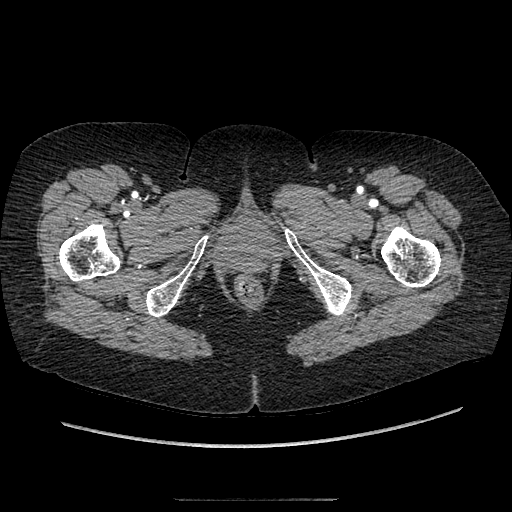
[im 55/927  bone]
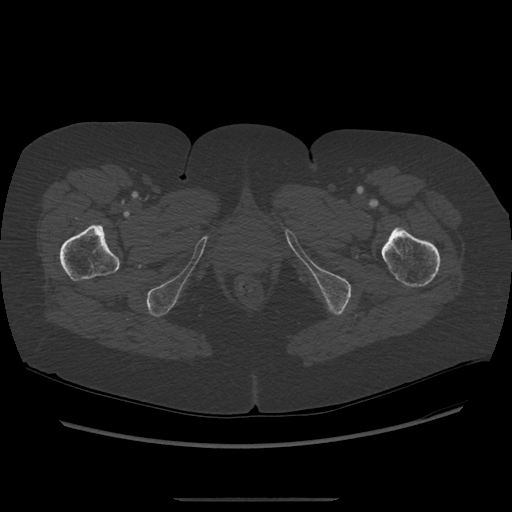
[im 164/927  soft-tissue]
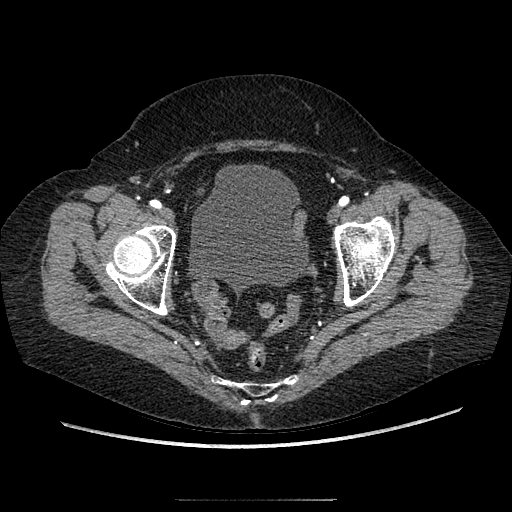
[im 218/927  soft-tissue]
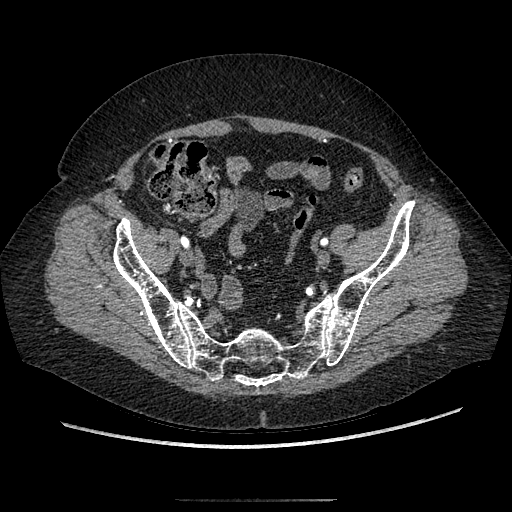
[im 327/927  soft-tissue]
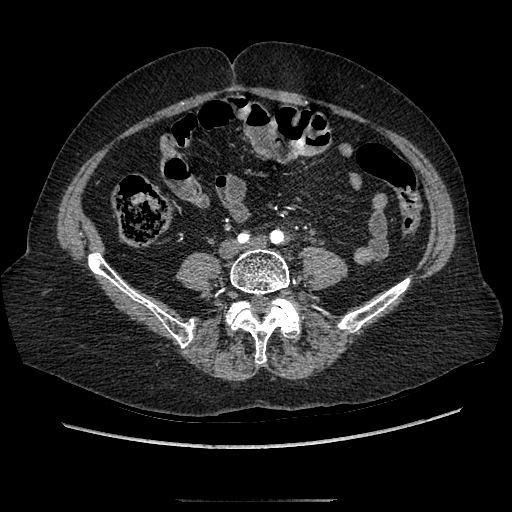
[im 382/927  soft-tissue]
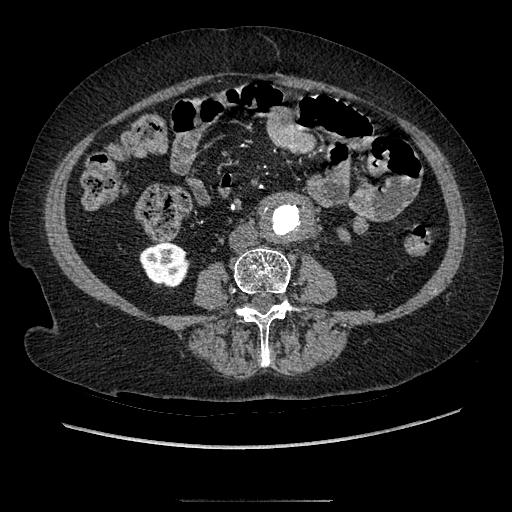
[im 491/927  soft-tissue]
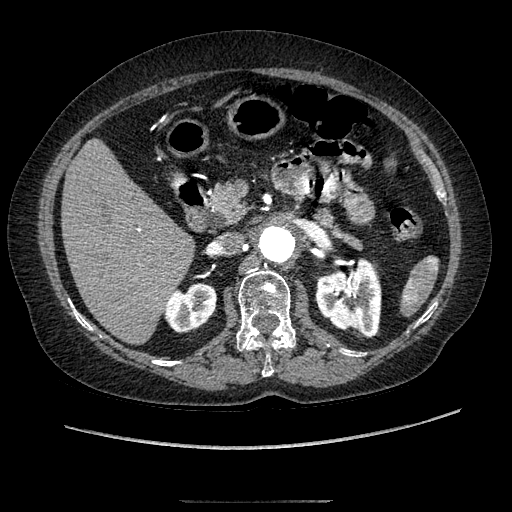
[im 545/927  soft-tissue]
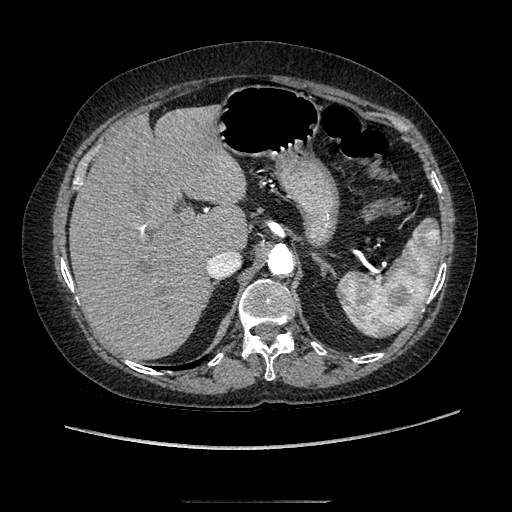
[im 600/927  soft-tissue]
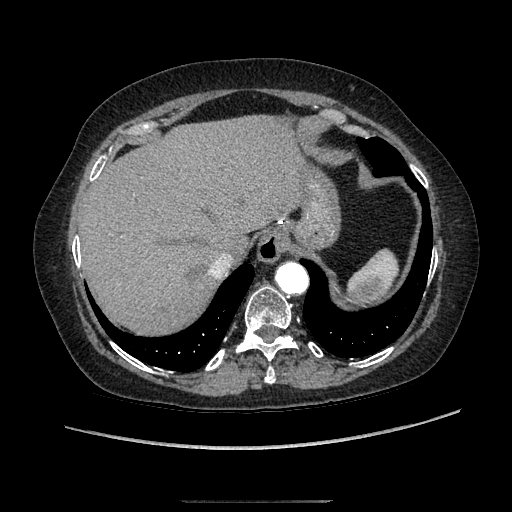
[im 709/927  soft-tissue]
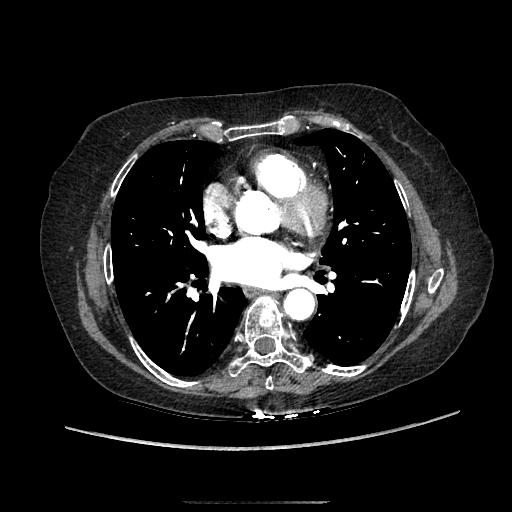
[im 709/927  lung]
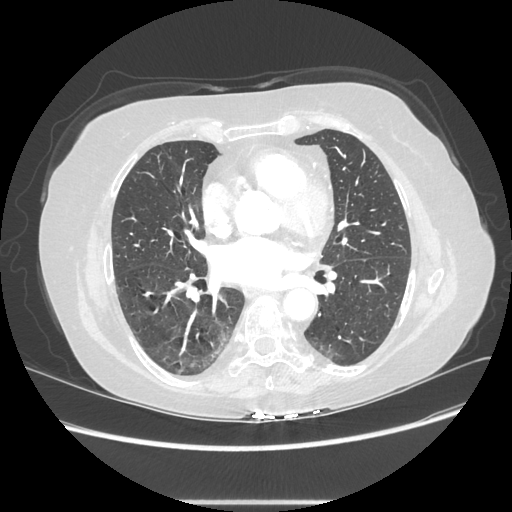
[im 709/927  bone]
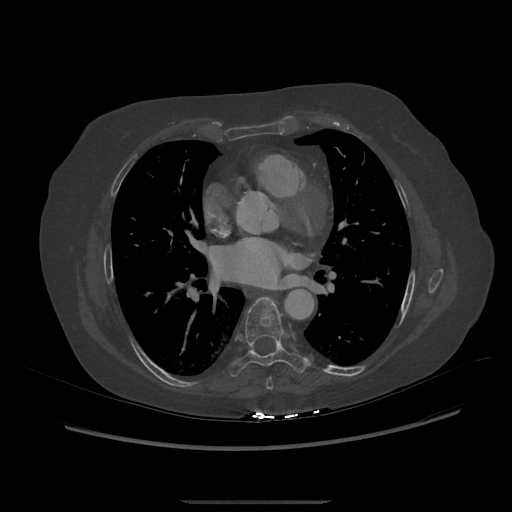
[im 763/927  soft-tissue]
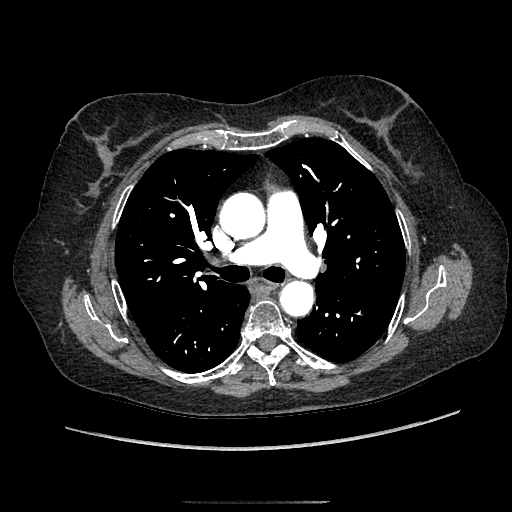
[im 763/927  lung]
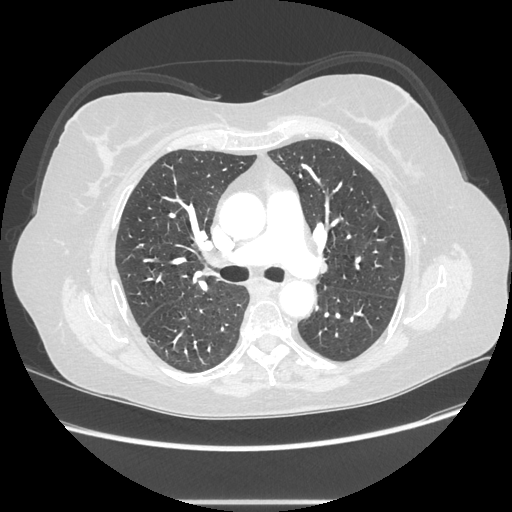
[im 818/927  lung]
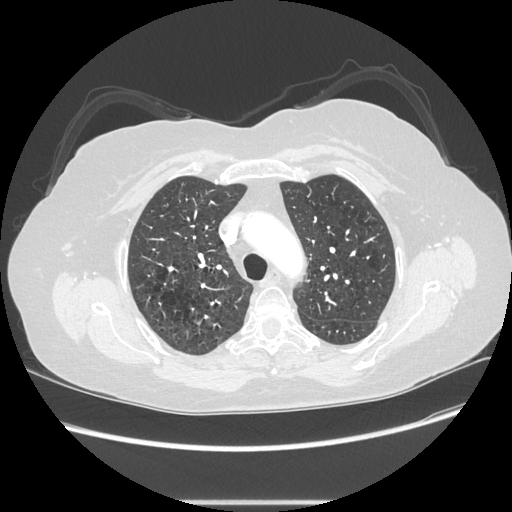
[im 872/927  soft-tissue]
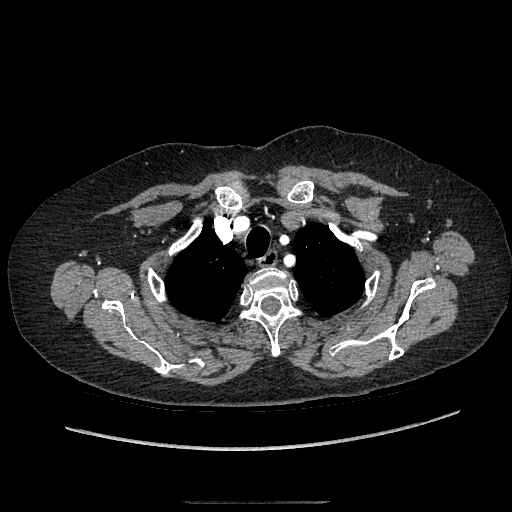
[im 872/927  lung]
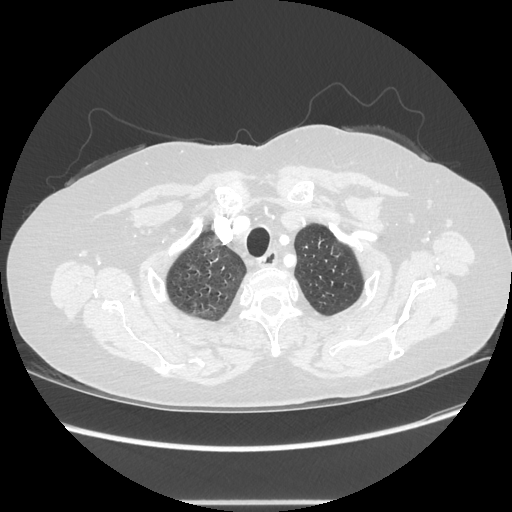

[12 of 46 positions shown; findings below may reference images not displayed]

FINDINGS: Chest:

Maximal diameter of the ascending aorta is 3.6 cm. The arch and
descending thoracic aorta are non aneurysmal. There is smooth
minimal plaque in the proximal descending thoracic aorta.

Minimal smooth plaque in the innominate artery. It is patent. Right
subclavian and common carotid arteries are patent. Left common
carotid artery is patent. Left subclavian artery is patent.
Vertebral arteries are patent within the confines of the
examination.

No evidence of acute pulmonary thromboembolism.

No evidence of abnormal mediastinal adenopathy. Thyroid is
unremarkable.

There is centrilobular emphysema towards the lung apices. Dependent
atelectasis posteriorly. Tiny nodules in the right middle lobe. On
image 62, 1 of them is 3 mm. 5 mm left lower lobe pulmonary nodule
on image 79. These are stable in comparison to prior recent and
remote studies.

Stable mid thoracic compression deformities.

Abdomen:

Infrarenal abdominal aorta with a maximal transverse diameter of
cm is stable. There continues to be thickening of the wall of the
aorta with haziness in the adjacent fat. The wall enhances on
delayed imaging. These findings are compatible with inflammation of
the wall or vasculitis. These findings are stable.

Celiac is patent.  Branch vessels are non aneurysmal and patent.

SMA is patent.

IMA is severely diminutive with severe narrowing as the lumen passes
through the thickened wall of the lower aortic aneurysm. Branch
vessels are patent and diminutive.

Single renal arteries are patent.

Bilateral common, internal, and external iliac arteries are patent
and non aneurysmal.

Postcholecystectomy

Liver, spleen, pancreas, adrenal glands are within normal limits.

Simple cysts in the right kidney. Chronic changes of the left kidney

Uterus is absent. Adnexa are unremarkable. There is bladder prolapse
towards the vagina.

No focal mass of the colon. Upper normal gas-filled loops of small
bowel are in the left hemi abdomen.

Upper lumbar and lower thoracic compression deformities are stable.
Lumbosacral junction is transitional.

No free-fluid.  No abnormal retroperitoneal adenopathy

Review of the MIP images confirms the above findings.
IMPRESSION: Within the thorax, there is no evidence of aortic aneurysm. Mild
atherosclerotic changes are noted.

Small bilateral pulmonary nodules are stable compatible with benign
disease.

Emphysema is noted

Infrarenal abdominal aortic aneurysm is stable at 5.5 cm. There is
persistent thickening and enhancement of the wall with inflammatory
change compatible with inflammatory aneurysm or vasculitis.

Stable compression deformities in the thoracic and lumbar spine.

## 2015-10-28 MED ORDER — IOPAMIDOL (ISOVUE-370) INJECTION 76%
75.0000 mL | Freq: Once | INTRAVENOUS | Status: AC | PRN
  Administered 2015-10-28: 75 mL via INTRAVENOUS

## 2015-10-28 NOTE — Progress Notes (Signed)
[facility] [place]  Patient name: Olivia Wade MRN: [id number] DOB: 1950-09-16 Sex: female  REASON FOR VISIT: follow up AAA  HPI: This is a 65 year old female who presented to the [facility] ER in March 2017 with abdominal pain, following a stress test. She had a CT scan which revealed a 4.2cm AAA which had increased from 3.5 cm. There was inflammation surrounding the AAA. Since then, her pain has improved, but it still persists. She denies fevers. She does complain of decreased energy, but this has been going on for a long time.  She suffers from CAD, and is s/p MI in 2015, treated with stenting x2. She is on Brilinta. Her recent stress test was a normal study. She suffers from COPD, requiring inhalers. She quit smoking in 2015. She is on a statin for hypercholesterolemia, and an ACE-I for hypertension.  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 The pain appeared to be getting better, however she is back today stating that it still persists.  Past Medical History  Diagnosis Date  . AAA (abdominal aortic aneurysm) (HCC)   . Tobacco abuse   . HTN (hypertension)   . COPD (chronic obstructive pulmonary disease) (HCC)   . Asthma   . HLD (hyperlipidemia)   . GERD (gastroesophageal reflux disease)   . Chronic low back pain     (Old vertebral fracture)  . Arthritis   . Coronary artery disease 09/08/13    with PCI  . NSTEMI (non-ST elevated myocardial infarction) (HCC) 09/08/13  . Heart palpitations     prn toprol for rapid HR  . History of surgery on arm     Family History  Problem Relation Age of Onset  . Heart attack Father   . Cancer Father     Esophageal  . Heart disease Father     before age 29  . Heart attack Son 42  . Heart disease Son     before age 63  . Hypertension Mother     SOCIAL HISTORY: Social History  Substance Use Topics  . Smoking status: Former Smoker -- 0.50  packs/day for 37 years    Types: Cigarettes    Quit date: 09/08/2013  . Smokeless tobacco: Never Used  . Alcohol Use: No    Allergies  Allergen Reactions  . Bee Venom Anaphylaxis, Hives and Swelling  . Penicillins Other (See Comments)    PATIENT REPORTS "MOTHER TOLD HER SHE HAD ALLERGY TO PCN"  . Hydrocodone Other (See Comments)  . Morphine And Related Rash    Current Outpatient Prescriptions  Medication Sig Dispense Refill  . acetaminophen (TYLENOL) 325 MG tablet Take 325 mg by mouth every 6 (six) hours as needed for moderate pain.    Marland Kitchen albuterol (PROVENTIL HFA;VENTOLIN HFA) 108 (90 BASE) MCG/ACT inhaler Inhale 1 puff into the lungs every 6 (six) hours as needed for wheezing or shortness of breath.    Marland Kitchen albuterol (PROVENTIL) (2.5 MG/3ML) 0.083% nebulizer solution Take 2.5 mg by nebulization every 6 (six) hours as needed for wheezing or shortness of breath.    . ALPRAZolam (XANAX) 0.5 MG tablet Take 0.5 mg by mouth 3 (three) times daily as needed for anxiety or sleep. *Patient is prescribed one tablets three times daily as needed for anxiety    . aspirin EC 81 MG tablet Take 81 mg by mouth daily.    Marland Kitchen atorvastatin (LIPITOR) 40 MG tablet Take 1  tablet (40 mg total) by mouth daily. KEEP OV. 30 tablet 0  . citalopram (CELEXA) 20 MG tablet Take 20 mg by mouth daily.    . cyclobenzaprine (FLEXERIL) 10 MG tablet Take 1 tablet (10 mg total) by mouth 3 (three) times daily as needed for muscle spasms. 30 tablet 6  . folic acid (FOLVITE) 1 MG tablet Take 1 tablet (1 mg total) by mouth daily. 30 tablet 2  . iron polysaccharides (NIFEREX) 150 MG capsule Take 1 capsule (150 mg total) by mouth daily. 30 capsule 2  . isosorbide mononitrate (IMDUR) 30 MG 24 hr tablet Take 30 mg by mouth daily.    Marland Kitchen lisinopril (PRINIVIL,ZESTRIL) 5 MG tablet take 1 tablet by mouth once daily 30 tablet 10  . MELATONIN PO Take 1 tablet by mouth daily as needed (sleep).    . Menthol, Topical Analgesic, (ICY HOT BACK EX)  Apply 1 application topically daily as needed (on back).    . metoprolol succinate (TOPROL-XL) 50 MG 24 hr tablet Take 1 tablet (50 mg total) by mouth 2 (two) times daily. PLEASE CONTACT OFFICE FOR ADDITIONAL REFILLS 60 tablet 1  . montelukast (SINGULAIR) 10 MG tablet Take 10 mg by mouth at bedtime.    . nitroGLYCERIN (NITROSTAT) 0.4 MG SL tablet Place 1 tablet (0.4 mg total) under the tongue every 5 (five) minutes as needed for chest pain. 25 tablet 5  . Omega-3 Fatty Acids (FISH OIL EXTRA STRENGTH PO) Take by mouth.    . pantoprazole (PROTONIX) 40 MG tablet Take 1 tablet (40 mg total) by mouth every morning. 60 tablet 5  . ticagrelor (BRILINTA) 60 MG TABS tablet Take 1 tablet (60 mg total) by mouth 2 (two) times daily. 60 tablet 11   No current facility-administered medications for this visit.    REVIEW OF SYSTEMS:   denotes positive finding,  denotes negative finding Cardiac  Comments:  Chest pain or chest pressure:    Shortness of breath upon exertion:    Short of breath when lying flat:    Irregular heart rhythm:        Vascular    Pain in calf, thigh, or hip brought on by ambulation:    Pain in feet at night that wakes you up from your sleep:     Blood clot in your veins:    Leg swelling:         Pulmonary    Oxygen at home:    Productive cough:     Wheezing:  x       Neurologic    Sudden weakness in arms or legs:     Sudden numbness in arms or legs:     Sudden onset of difficulty speaking or slurred speech:    Temporary loss of vision in one eye:     Problems with dizziness:         Gastrointestinal    Blood in stool:     Vomited blood:         Genitourinary    Burning when urinating:     Blood in urine:        Psychiatric    Major depression:         Hematologic    Bleeding problems:    Problems with blood clotting too easily:        Skin    Rashes or ulcers:        Constitutional    Fever or chills:  PHYSICAL EXAM: Filed Vitals:    10/28/15 1139  BP: 110/75  Pulse: 68  Temp: 97.4 F (36.3 C)  Resp: 16  Height: 5\' 4"  (1.626 m)  Weight: 154 lb (69.854 kg)  SpO2: 96%    GENERAL: The patient is a well-nourished female, in no acute distress. The vital signs are documented above. CARDIAC: There is a regular rate and rhythm.  VASCULAR: palpable pedal PULMONARY: There is good air exchange bilaterally without wheezing or rales. ABDOMEN: Soft and non-tender with normal pitched bowel sounds.  MUSCULOSKELETAL: There are no major deformities or cyanosis. NEUROLOGIC: No focal weakness or paresthesias are detected. SKIN: There are no ulcers or rashes noted. PSYCHIATRIC: The patient has a normal affect.  DATA:  The patient had a repeat CT angiogram today which I have reviewed and we'll use for planning purposes for fenestrated endovascular aneurysm repair  MEDICAL ISSUES: AAA:  I discussed with the patient that based on her anatomy in her infrarenal neck, she is not a candidate for straightforward endovascular repair and will need a fenestrated repair.  We discussed the increased risks with this procedure including the risk of renal failure.  I also discussed the risks of aneurysm repair in general which were detailed in the previous note.  She understands our device will need to be custom made and shipped to the [place].  This means that her repair will be an approximate 4-6 weeks.  I think because of the inflammatory component within her aneurysm that it is reasonable to wait to get a fenestrated device so that she does not get exposed to the risks of open surgery in the setting of an inflammatory aneurysm.  She has received cardiac clearance.  She will need to be off of her Brilinta for 5 days prior to her operation which will be scheduled in late June/early July    [name] [facility] [place]: [phone]

## 2015-11-02 ENCOUNTER — Other Ambulatory Visit: Payer: Self-pay | Admitting: *Deleted

## 2015-11-02 MED ORDER — METOPROLOL SUCCINATE ER 50 MG PO TB24: 50.0000 mg | ORAL_TABLET | Freq: Two times a day (BID) | ORAL | Status: DC

## 2015-11-11 ENCOUNTER — Other Ambulatory Visit: Payer: Self-pay

## 2015-11-11 MED ORDER — ATORVASTATIN CALCIUM 40 MG PO TABS: 40.0000 mg | ORAL_TABLET | Freq: Every day | ORAL | Status: DC

## 2015-11-20 ENCOUNTER — Encounter (HOSPITAL_COMMUNITY): Payer: Self-pay | Admitting: Emergency Medicine

## 2015-11-20 ENCOUNTER — Inpatient Hospital Stay (HOSPITAL_COMMUNITY)
Admission: EM | Admit: 2015-11-20 | Discharge: 2015-11-23 | DRG: 301 | Disposition: A | Discharge status: Home / Self Care | Payer: BLUE CROSS/BLUE SHIELD | Attending: Internal Medicine | Admitting: Internal Medicine

## 2015-11-20 DIAGNOSIS — Z955 Presence of coronary angioplasty implant and graft: Secondary | ICD-10-CM

## 2015-11-20 DIAGNOSIS — I714 Abdominal aortic aneurysm, without rupture, unspecified: Secondary | ICD-10-CM | POA: Diagnosis present

## 2015-11-20 DIAGNOSIS — J449 Chronic obstructive pulmonary disease, unspecified: Secondary | ICD-10-CM | POA: Diagnosis present

## 2015-11-20 DIAGNOSIS — Z91041 Radiographic dye allergy status: Secondary | ICD-10-CM

## 2015-11-20 DIAGNOSIS — Z885 Allergy status to narcotic agent status: Secondary | ICD-10-CM

## 2015-11-20 DIAGNOSIS — Z66 Do not resuscitate: Secondary | ICD-10-CM | POA: Diagnosis present

## 2015-11-20 DIAGNOSIS — I2510 Atherosclerotic heart disease of native coronary artery without angina pectoris: Secondary | ICD-10-CM | POA: Diagnosis present

## 2015-11-20 DIAGNOSIS — Z7901 Long term (current) use of anticoagulants: Secondary | ICD-10-CM

## 2015-11-20 DIAGNOSIS — I7143 Infrarenal abdominal aortic aneurysm, without rupture: Secondary | ICD-10-CM | POA: Diagnosis present

## 2015-11-20 DIAGNOSIS — Z90710 Acquired absence of both cervix and uterus: Secondary | ICD-10-CM

## 2015-11-20 DIAGNOSIS — E785 Hyperlipidemia, unspecified: Secondary | ICD-10-CM | POA: Diagnosis present

## 2015-11-20 DIAGNOSIS — Z880 Allergy status to penicillin: Secondary | ICD-10-CM

## 2015-11-20 DIAGNOSIS — Z91030 Bee allergy status: Secondary | ICD-10-CM

## 2015-11-20 DIAGNOSIS — J4489 Other specified chronic obstructive pulmonary disease: Secondary | ICD-10-CM | POA: Diagnosis present

## 2015-11-20 DIAGNOSIS — I252 Old myocardial infarction: Secondary | ICD-10-CM

## 2015-11-20 DIAGNOSIS — Z8249 Family history of ischemic heart disease and other diseases of the circulatory system: Secondary | ICD-10-CM

## 2015-11-20 DIAGNOSIS — I10 Essential (primary) hypertension: Secondary | ICD-10-CM | POA: Diagnosis present

## 2015-11-20 DIAGNOSIS — K219 Gastro-esophageal reflux disease without esophagitis: Secondary | ICD-10-CM | POA: Diagnosis present

## 2015-11-20 DIAGNOSIS — Z79899 Other long term (current) drug therapy: Secondary | ICD-10-CM

## 2015-11-20 DIAGNOSIS — Z7982 Long term (current) use of aspirin: Secondary | ICD-10-CM

## 2015-11-20 NOTE — ED Provider Notes (Addendum)
By signing my name below, I, Olivia Wade, attest that this documentation has been prepared under the direction and in the presence of Olivia Martha N Aaban Griep, DO. Electronically Signed: Doreatha Wade, ED Scribe. 11/21/2015. 12:09 AM.   TIME SEEN: 12:00 AM   CHIEF COMPLAINT:  Chief Complaint  Patient presents with  . Abdominal Pain     HPI:  HPI Comments: Olivia Wade is a 65 y.o. female with h/o CAD with stents on Brilinta, COPD not on home oxygen, AAA who presents to the Emergency Department complaining of sudden onset, moderate to severe lower abdominal pain onset 2 hours ago with associated nausea. Pt reports frequent h/o similar pain d/t AAA, but not as severe. Pt has not yet been scheduled for AAA repair. She is being followed by Dr.Brabham. Pt denies taking OTC medications at home to improve symptoms. She reports she is not prescribed any narcotics for pain management. Pt reports that her abdominal pain is worsened with urination, which has been ongoing for 3 weeks. Last BM was normal this morning. She denies CP, hematuria, fever, emesis, constipation, bloody stools, abnormal melena (on iron supplement), numbness, paresthesia, focal weakness.  ROS: See HPI Constitutional: no fever  Eyes: no drainage  ENT: no runny nose   Cardiovascular:  no chest pain  Resp: no SOB  GI: no vomiting, bloody stools, constipation. Positive for nausea, abdominal pain  GU: no hematuria  Integumentary: no rash  Allergy: no hives  Musculoskeletal: no leg swelling  Neurological: no slurred speech, numbness, paresthesia ROS otherwise negative  PAST MEDICAL HISTORY/PAST SURGICAL HISTORY:  Past Medical History  Diagnosis Date  . AAA (abdominal aortic aneurysm) (HCC)   . Tobacco abuse   . HTN (hypertension)   . COPD (chronic obstructive pulmonary disease) (HCC)   . Asthma   . HLD (hyperlipidemia)   . GERD (gastroesophageal reflux disease)   . Chronic low back pain     (Old vertebral fracture)  . Arthritis    . Coronary artery disease 09/08/13    with PCI  . NSTEMI (non-ST elevated myocardial infarction) (HCC) 09/08/13  . Heart palpitations     prn toprol for rapid HR  . History of surgery on arm     MEDICATIONS:  Prior to Admission medications   Medication Sig Start Date End Date Taking? Authorizing Provider  acetaminophen (TYLENOL) 325 MG tablet Take 325 mg by mouth every 6 (six) hours as needed for moderate pain.    Historical Provider, MD  albuterol (PROVENTIL HFA;VENTOLIN HFA) 108 (90 BASE) MCG/ACT inhaler Inhale 1 puff into the lungs every 6 (six) hours as needed for wheezing or shortness of breath.    Historical Provider, MD  albuterol (PROVENTIL) (2.5 MG/3ML) 0.083% nebulizer solution Take 2.5 mg by nebulization every 6 (six) hours as needed for wheezing or shortness of breath.    Historical Provider, MD  ALPRAZolam Prudy Feeler) 0.5 MG tablet Take 0.5 mg by mouth 3 (three) times daily as needed for anxiety or sleep. *Patient is prescribed one tablets three times daily as needed for anxiety    Historical Provider, MD  aspirin EC 81 MG tablet Take 81 mg by mouth daily.    Historical Provider, MD  atorvastatin (LIPITOR) 40 MG tablet Take 1 tablet (40 mg total) by mouth daily. 11/11/15   Lennette Bihari, MD  citalopram (CELEXA) 20 MG tablet Take 20 mg by mouth daily.    Historical Provider, MD  cyclobenzaprine (FLEXERIL) 10 MG tablet Take 1 tablet (10 mg total) by  mouth 3 (three) times daily as needed for muscle spasms.    Lennette Bihari, MD  folic acid (FOLVITE) 1 MG tablet Take 1 tablet (1 mg total) by mouth daily. 09/12/15   Lennette Bihari, MD  iron polysaccharides (NIFEREX) 150 MG capsule Take 1 capsule (150 mg total) by mouth daily. 09/12/15   Lennette Bihari, MD  isosorbide mononitrate (IMDUR) 30 MG 24 hr tablet Take 30 mg by mouth daily.    Historical Provider, MD  lisinopril (PRINIVIL,ZESTRIL) 5 MG tablet take 1 tablet by mouth once daily 05/04/15   Lennette Bihari, MD  MELATONIN PO Take 1 tablet by  mouth daily as needed (sleep).    Historical Provider, MD  Menthol, Topical Analgesic, (ICY HOT BACK EX) Apply 1 application topically daily as needed (on back).    Historical Provider, MD  metoprolol succinate (TOPROL-XL) 50 MG 24 hr tablet Take 1 tablet (50 mg total) by mouth 2 (two) times daily. 11/02/15   Laqueta Linden, MD  montelukast (SINGULAIR) 10 MG tablet Take 10 mg by mouth at bedtime.    Historical Provider, MD  nitroGLYCERIN (NITROSTAT) 0.4 MG SL tablet Place 1 tablet (0.4 mg total) under the tongue every 5 (five) minutes as needed for chest pain. 08/15/15   Lennette Bihari, MD  Omega-3 Fatty Acids (FISH OIL EXTRA STRENGTH PO) Take by mouth.    Historical Provider, MD  pantoprazole (PROTONIX) 40 MG tablet Take 1 tablet (40 mg total) by mouth every morning. 11/25/14   Lennette Bihari, MD  ticagrelor (BRILINTA) 60 MG TABS tablet Take 1 tablet (60 mg total) by mouth 2 (two) times daily. 08/03/15   Lennette Bihari, MD    ALLERGIES:  Allergies  Allergen Reactions  . Bee Venom Anaphylaxis, Hives and Swelling  . Penicillins Other (See Comments)    PATIENT REPORTS "MOTHER TOLD HER SHE HAD ALLERGY TO PCN"  . Hydrocodone Other (See Comments)  . Iodine   . Morphine And Related Rash    SOCIAL HISTORY:  Social History  Substance Use Topics  . Smoking status: Former Smoker -- 0.50 packs/day for 37 years    Types: Cigarettes    Quit date: 09/08/2013  . Smokeless tobacco: Never Used  . Alcohol Use: No    FAMILY HISTORY: Family History  Problem Relation Age of Onset  . Heart attack Father   . Cancer Father     Esophageal  . Heart disease Father     before age 84  . Heart attack Son 73  . Heart disease Son     before age 75  . Hypertension Mother     EXAM: BP 110/70 mmHg  Pulse 94  Temp(Src) 98.1 F (36.7 C) (Oral)  Resp 24  Ht 5\' 4"  (1.626 m)  Wt 155 lb (70.308 kg)  BMI 26.59 kg/m2  SpO2 100% CONSTITUTIONAL: Alert and oriented and responds appropriately to questions.  Appears uncomfortable; chronically ill, moaning in pain.  HEAD: Normocephalic EYES: Conjunctivae clear, PERRL ENT: normal nose; no rhinorrhea; moist mucous membranes NECK: Supple, no meningismus, no LAD  CARD: RRR; S1 and S2 appreciated; no murmurs, no clicks, no rubs, no gallops RESP: Normal chest excursion without splinting or tachypnea; breath sounds clear and equal bilaterally; no wheezes, no rhonchi, no rales, no hypoxia or respiratory distress, speaking full sentences ABD/GI: Normal bowel sounds; non-distended; soft, TTP diffusely throughout the midline, worse in the suprapubic region with no pulsatile mass, no rebound, no guarding, no peritoneal signs  BACK:  The back appears normal and is non-tender to palpation, there is no CVA tenderness EXT: Normal ROM in all joints; non-tender to palpation; no edema; normal capillary refill; no cyanosis, no calf tenderness or swelling 2+ radial and DP pulses bilaterally.    SKIN: Normal color for age and race; warm; no rash NEURO: Moves all extremities equally, sensation to light touch intact diffusely, cranial nerves II through XII intact PSYCH: The patient's mood and manner are appropriate. Grooming and personal hygiene are appropriate.  MEDICAL DECISION MAKING: Patient here with abdominal pain with history of AAA. Seen quickly after she was placed in the room by both nursing staff and myself. Last seen by Dr. Myra Gianotti with vascular surgery on May 19 and had a CT scan which showed infrarenal abdominal aneurysm with associative inflammatory changes that was 5.5 cm. I have performed a bedside ultrasound that shows a negative fast but I am unable to appreciate the aorta secondary to bowel gas. Chem-8 was performed emergently patient has normal creatinine. Will be taken emergently to CT scan to evaluate for worsening aneurysm, rupture or dissection. Labs pending. We'll give IV fluids, fentanyl for pain.  ED PROGRESS: Pain is been well controlled with  fentanyl. She reports is down from a 10/10 to now a 3/10. She is still hemodynamically stable. She will have episodes of documented hypotension that this is when she is lying on her side with her arm bent. When you have her lie flat on her back with her arms straight her systolic blood pressure is 100-110 and diastolic blood pressure is 70s.    Patient CT scan shows worsening aneurysm at 6 cm. 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 We'll discuss with vascular surgery on call. Discussed findings with patient and husband. She reports she is a DO NOT RESUSCITATE/DO NOT INTUBATE.   1:35 AM  D/w Dr. Myra Gianotti.  He has reviewed patient's imaging. We appreciate his help. He states that patient is not an operative candidate at this time given her inflammatory aneurysm and that she would need a fenestrated repair and is currently awaiting a graft. She would also not be a surgical candidate because she is still on Brilinta. Have discussed with vascular surgery if they would like for me to give [facility] at this time but they would not is a do not feel this would be helpful. They would like medicine admission for pain control, blood pressure control and management of her other chronic medical conditions. Patient will need to be transferred to [facility] where she will be seen by vascular surgery.  We have discussed that this is a life-threatening process and the plan would be to stabilize patient until her graft arrived. Patient and family have been updated with this plan.   1:45 AM  Discussed patient's case with hospitalist, Dr. Toniann Fail.  Recommend admission to inpatient, stepdown bed.  I will place holding orders per their request. Patient and family (if present) updated with plan. Care transferred to hospitalist service.  Accepting physician at [facility] is Dr. Madelyn Flavors.  Pt is still HD stable.  I reviewed all nursing notes, vitals, pertinent old records,  EKGs, labs, imaging (as available).    EKG Interpretation  Date/Time:  Monday November 21 2015 00:00:08 EDT Ventricular Rate:  86 PR Interval:  160 QRS Duration: 97 QT Interval:  372 QTC Calculation: 445 R Axis:   31 Text Interpretation:  Sinus rhythm Abnormal R-wave progression, early transition No significant change  since last tracing Confirmed by Jarmar Rousseau,  DO, Brodee Mauritz [phone]) on 11/21/2015 2:51:03 AM       CRITICAL CARE Performed by: Olivia Wade   Total critical care time: 45 minutes  Critical care time was exclusive of separately billable procedures and treating other patients.  Critical care was necessary to treat or prevent imminent or life-threatening deterioration.  Critical care was time spent personally by me on the following activities: development of treatment plan with patient and/or surrogate as well as nursing, discussions with consultants, evaluation of patient's response to treatment, examination of patient, obtaining history from patient or surrogate, ordering and performing treatments and interventions, ordering and review of laboratory studies, ordering and review of radiographic studies, pulse oximetry and re-evaluation of patient's condition.   I personally performed the services described in this documentation, which was scribed in my presence. The recorded information has been reviewed and is accurate.   Olivia Maw Jeyson Deshotel, DO 11/21/15 0346   6:50 AM  Pt is still Awaiting a stepdown bed. She is still hemodynamically stable. Has some increase in her pain.  I have ordered her to receive 50 g of IV fentanyl every 2 hours as needed for pain. Discussed with [facility] who is not sure when stepdown bed would be available but will let us know after rounds at [facility] this morning. Discussed again with Dr. Myra Gianotti.  Asked him if he would like [place] to transfer patient to the emergency department at [facility]. He stated that we could wait until stepdown bed was available at [facility] as it we're only working on pain control, blood pressure control for patient. There would not be any surgical intervention done at this time.  Olivia Maw Nyala Kirchner, DO 11/21/15 0654  Olivia Maw Ameena Vesey, DO 11/22/15 1308

## 2015-11-20 NOTE — ED Notes (Signed)
Pt c/o severe abdominal pain x 45 minutes. States she has an AAA and that she is scheduled to have surgery on it in 2 weeks.

## 2015-11-21 ENCOUNTER — Encounter (HOSPITAL_COMMUNITY): Payer: Self-pay | Admitting: Internal Medicine

## 2015-11-21 ENCOUNTER — Emergency Department (HOSPITAL_COMMUNITY): Payer: BLUE CROSS/BLUE SHIELD

## 2015-11-21 DIAGNOSIS — Z880 Allergy status to penicillin: Secondary | ICD-10-CM | POA: Diagnosis not present

## 2015-11-21 DIAGNOSIS — Z955 Presence of coronary angioplasty implant and graft: Secondary | ICD-10-CM | POA: Diagnosis not present

## 2015-11-21 DIAGNOSIS — Z79899 Other long term (current) drug therapy: Secondary | ICD-10-CM | POA: Diagnosis not present

## 2015-11-21 DIAGNOSIS — Z91030 Bee allergy status: Secondary | ICD-10-CM | POA: Diagnosis not present

## 2015-11-21 DIAGNOSIS — R1030 Lower abdominal pain, unspecified: Secondary | ICD-10-CM | POA: Diagnosis not present

## 2015-11-21 DIAGNOSIS — I714 Abdominal aortic aneurysm, without rupture, unspecified: Secondary | ICD-10-CM | POA: Diagnosis present

## 2015-11-21 DIAGNOSIS — I7143 Infrarenal abdominal aortic aneurysm, without rupture: Secondary | ICD-10-CM | POA: Diagnosis present

## 2015-11-21 DIAGNOSIS — Z885 Allergy status to narcotic agent status: Secondary | ICD-10-CM | POA: Diagnosis not present

## 2015-11-21 DIAGNOSIS — Z7901 Long term (current) use of anticoagulants: Secondary | ICD-10-CM | POA: Diagnosis not present

## 2015-11-21 DIAGNOSIS — J449 Chronic obstructive pulmonary disease, unspecified: Secondary | ICD-10-CM | POA: Diagnosis present

## 2015-11-21 DIAGNOSIS — I2510 Atherosclerotic heart disease of native coronary artery without angina pectoris: Secondary | ICD-10-CM | POA: Diagnosis present

## 2015-11-21 DIAGNOSIS — Z66 Do not resuscitate: Secondary | ICD-10-CM | POA: Diagnosis present

## 2015-11-21 DIAGNOSIS — K219 Gastro-esophageal reflux disease without esophagitis: Secondary | ICD-10-CM | POA: Diagnosis present

## 2015-11-21 DIAGNOSIS — Z7982 Long term (current) use of aspirin: Secondary | ICD-10-CM | POA: Diagnosis not present

## 2015-11-21 DIAGNOSIS — Z90710 Acquired absence of both cervix and uterus: Secondary | ICD-10-CM | POA: Diagnosis not present

## 2015-11-21 DIAGNOSIS — Z91041 Radiographic dye allergy status: Secondary | ICD-10-CM | POA: Diagnosis not present

## 2015-11-21 DIAGNOSIS — I10 Essential (primary) hypertension: Secondary | ICD-10-CM | POA: Diagnosis present

## 2015-11-21 DIAGNOSIS — E785 Hyperlipidemia, unspecified: Secondary | ICD-10-CM | POA: Diagnosis present

## 2015-11-21 DIAGNOSIS — I252 Old myocardial infarction: Secondary | ICD-10-CM | POA: Diagnosis not present

## 2015-11-21 DIAGNOSIS — Z8249 Family history of ischemic heart disease and other diseases of the circulatory system: Secondary | ICD-10-CM | POA: Diagnosis not present

## 2015-11-21 LAB — CBC WITH DIFFERENTIAL/PLATELET
Basophils Absolute: 0 10*3/uL (ref 0.0–0.1)
Basophils Absolute: 0 10*3/uL (ref 0.0–0.1)
Basophils Relative: 0 %
Basophils Relative: 0 %
EOS PCT: 1 %
EOS PCT: 1 %
Eosinophils Absolute: 0.1 10*3/uL (ref 0.0–0.7)
Eosinophils Absolute: 0.1 10*3/uL (ref 0.0–0.7)
HCT: 30.6 % — ABNORMAL LOW (ref 36.0–46.0)
HEMATOCRIT: 31.3 % — AB (ref 36.0–46.0)
HEMOGLOBIN: 9.8 g/dL — AB (ref 12.0–15.0)
Hemoglobin: 10.3 g/dL — ABNORMAL LOW (ref 12.0–15.0)
LYMPHS ABS: 2.3 10*3/uL (ref 0.7–4.0)
LYMPHS ABS: 2.5 10*3/uL (ref 0.7–4.0)
LYMPHS PCT: 23 %
LYMPHS PCT: 35 %
MCH: 28.6 pg (ref 26.0–34.0)
MCH: 29.5 pg (ref 26.0–34.0)
MCHC: 32 g/dL (ref 30.0–36.0)
MCHC: 32.9 g/dL (ref 30.0–36.0)
MCV: 89.2 fL (ref 78.0–100.0)
MCV: 89.7 fL (ref 78.0–100.0)
MONO ABS: 0.7 10*3/uL (ref 0.1–1.0)
Monocytes Absolute: 0.8 10*3/uL (ref 0.1–1.0)
Monocytes Relative: 11 %
Monocytes Relative: 7 %
NEUTROS ABS: 7 10*3/uL (ref 1.7–7.7)
Neutro Abs: 3.8 10*3/uL (ref 1.7–7.7)
Neutrophils Relative %: 53 %
Neutrophils Relative %: 69 %
PLATELETS: 420 10*3/uL — AB (ref 150–400)
Platelets: 374 10*3/uL (ref 150–400)
RBC: 3.43 MIL/uL — AB (ref 3.87–5.11)
RBC: 3.49 MIL/uL — AB (ref 3.87–5.11)
RDW: 13.8 % (ref 11.5–15.5)
RDW: 14.3 % (ref 11.5–15.5)
WBC: 10.1 10*3/uL (ref 4.0–10.5)
WBC: 7.2 10*3/uL (ref 4.0–10.5)

## 2015-11-21 LAB — COMPREHENSIVE METABOLIC PANEL
ALK PHOS: 67 U/L (ref 38–126)
ALK PHOS: 80 U/L (ref 38–126)
ALT: 10 U/L — AB (ref 14–54)
ALT: 9 U/L — AB (ref 14–54)
AST: 13 U/L — ABNORMAL LOW (ref 15–41)
AST: 14 U/L — ABNORMAL LOW (ref 15–41)
Albumin: 2.8 g/dL — ABNORMAL LOW (ref 3.5–5.0)
Albumin: 3.2 g/dL — ABNORMAL LOW (ref 3.5–5.0)
Anion gap: 10 (ref 5–15)
Anion gap: 8 (ref 5–15)
BILIRUBIN TOTAL: 0.3 mg/dL (ref 0.3–1.2)
BILIRUBIN TOTAL: 0.5 mg/dL (ref 0.3–1.2)
BUN: 6 mg/dL (ref 6–20)
BUN: 9 mg/dL (ref 6–20)
CALCIUM: 8.5 mg/dL — AB (ref 8.9–10.3)
CALCIUM: 8.8 mg/dL — AB (ref 8.9–10.3)
CHLORIDE: 104 mmol/L (ref 101–111)
CO2: 18 mmol/L — ABNORMAL LOW (ref 22–32)
CO2: 22 mmol/L (ref 22–32)
CREATININE: 0.71 mg/dL (ref 0.44–1.00)
CREATININE: 0.75 mg/dL (ref 0.44–1.00)
Chloride: 110 mmol/L (ref 101–111)
Glucose, Bld: 113 mg/dL — ABNORMAL HIGH (ref 65–99)
Glucose, Bld: 82 mg/dL (ref 65–99)
Potassium: 3.6 mmol/L (ref 3.5–5.1)
Potassium: 3.6 mmol/L (ref 3.5–5.1)
Sodium: 134 mmol/L — ABNORMAL LOW (ref 135–145)
Sodium: 138 mmol/L (ref 135–145)
TOTAL PROTEIN: 8 g/dL (ref 6.5–8.1)
Total Protein: 7.1 g/dL (ref 6.5–8.1)

## 2015-11-21 LAB — URINE MICROSCOPIC-ADD ON

## 2015-11-21 LAB — SAMPLE TO BLOOD BANK

## 2015-11-21 LAB — LIPASE, BLOOD: LIPASE: 23 U/L (ref 11–51)

## 2015-11-21 LAB — URINALYSIS, ROUTINE W REFLEX MICROSCOPIC
BILIRUBIN URINE: NEGATIVE
GLUCOSE, UA: NEGATIVE mg/dL
Ketones, ur: NEGATIVE mg/dL
NITRITE: NEGATIVE
PH: 5.5 (ref 5.0–8.0)
Protein, ur: NEGATIVE mg/dL

## 2015-11-21 LAB — TYPE AND SCREEN
ABO/RH(D): B NEG
ANTIBODY SCREEN: NEGATIVE

## 2015-11-21 LAB — I-STAT CHEM 8, ED
BUN: 8 mg/dL (ref 6–20)
CHLORIDE: 102 mmol/L (ref 101–111)
CREATININE: 0.7 mg/dL (ref 0.44–1.00)
Calcium, Ion: 1.19 mmol/L (ref 1.13–1.30)
GLUCOSE: 110 mg/dL — AB (ref 65–99)
HCT: 31 % — ABNORMAL LOW (ref 36.0–46.0)
Hemoglobin: 10.5 g/dL — ABNORMAL LOW (ref 12.0–15.0)
POTASSIUM: 3.7 mmol/L (ref 3.5–5.1)
Sodium: 137 mmol/L (ref 135–145)
TCO2: 22 mmol/L (ref 0–100)

## 2015-11-21 LAB — GLUCOSE, CAPILLARY: GLUCOSE-CAPILLARY: 105 mg/dL — AB (ref 65–99)

## 2015-11-21 LAB — TROPONIN I

## 2015-11-21 LAB — MRSA PCR SCREENING: MRSA by PCR: NEGATIVE

## 2015-11-21 LAB — PROTIME-INR
INR: 1.06 (ref 0.00–1.49)
Prothrombin Time: 14 seconds (ref 11.6–15.2)

## 2015-11-21 LAB — APTT: aPTT: 35 seconds (ref 24–37)

## 2015-11-21 MED ORDER — SODIUM CHLORIDE 0.9 % IV BOLUS (SEPSIS)
1000.0000 mL | Freq: Once | INTRAVENOUS | Status: AC
  Administered 2015-11-21: 1000 mL via INTRAVENOUS

## 2015-11-21 MED ORDER — ONDANSETRON HCL 4 MG PO TABS
4.0000 mg | ORAL_TABLET | Freq: Four times a day (QID) | ORAL | Status: DC | PRN
  Administered 2015-11-23: 4 mg via ORAL
  Filled 2015-11-21: qty 1

## 2015-11-21 MED ORDER — ALBUTEROL SULFATE (2.5 MG/3ML) 0.083% IN NEBU: 2.5000 mg | INHALATION_SOLUTION | Freq: Four times a day (QID) | RESPIRATORY_TRACT | Status: DC | PRN

## 2015-11-21 MED ORDER — SODIUM CHLORIDE 0.9 % IV SOLN
INTRAVENOUS | Status: AC
  Administered 2015-11-21 – 2015-11-22 (×2): via INTRAVENOUS

## 2015-11-21 MED ORDER — LABETALOL HCL 5 MG/ML IV SOLN: 5.0000 mg | INTRAVENOUS | Status: DC | PRN

## 2015-11-21 MED ORDER — IOPAMIDOL (ISOVUE-370) INJECTION 76%
100.0000 mL | Freq: Once | INTRAVENOUS | Status: AC | PRN
  Administered 2015-11-21: 100 mL via INTRAVENOUS

## 2015-11-21 MED ORDER — POLYSACCHARIDE IRON COMPLEX 150 MG PO CAPS
150.0000 mg | ORAL_CAPSULE | Freq: Every day | ORAL | Status: DC
  Administered 2015-11-21 – 2015-11-23 (×3): 150 mg via ORAL
  Filled 2015-11-21 (×3): qty 1

## 2015-11-21 MED ORDER — ONDANSETRON HCL 4 MG/2ML IJ SOLN: 4.0000 mg | Freq: Four times a day (QID) | INTRAMUSCULAR | Status: DC | PRN

## 2015-11-21 MED ORDER — CITALOPRAM HYDROBROMIDE 20 MG PO TABS
20.0000 mg | ORAL_TABLET | Freq: Every day | ORAL | Status: DC
  Administered 2015-11-21 – 2015-11-23 (×3): 20 mg via ORAL
  Filled 2015-11-21 (×3): qty 1

## 2015-11-21 MED ORDER — METOPROLOL SUCCINATE ER 25 MG PO TB24
50.0000 mg | ORAL_TABLET | Freq: Two times a day (BID) | ORAL | Status: DC
  Administered 2015-11-21 – 2015-11-23 (×5): 50 mg via ORAL
  Filled 2015-11-21 (×5): qty 2

## 2015-11-21 MED ORDER — LISINOPRIL 5 MG PO TABS
5.0000 mg | ORAL_TABLET | Freq: Every day | ORAL | Status: DC
  Administered 2015-11-21 – 2015-11-23 (×3): 5 mg via ORAL
  Filled 2015-11-21 (×4): qty 1

## 2015-11-21 MED ORDER — FENTANYL CITRATE (PF) 100 MCG/2ML IJ SOLN
50.0000 ug | Freq: Once | INTRAMUSCULAR | Status: AC
  Administered 2015-11-21: 50 ug via INTRAVENOUS
  Filled 2015-11-21: qty 2

## 2015-11-21 MED ORDER — FOLIC ACID 1 MG PO TABS
1.0000 mg | ORAL_TABLET | Freq: Every day | ORAL | Status: DC
  Administered 2015-11-21 – 2015-11-23 (×3): 1 mg via ORAL
  Filled 2015-11-21 (×3): qty 1

## 2015-11-21 MED ORDER — ALPRAZOLAM 0.5 MG PO TABS
0.5000 mg | ORAL_TABLET | Freq: Three times a day (TID) | ORAL | Status: DC | PRN
  Administered 2015-11-22 (×2): 0.5 mg via ORAL
  Filled 2015-11-21 (×2): qty 1

## 2015-11-21 MED ORDER — OMEGA-3-ACID ETHYL ESTERS 1 G PO CAPS
1.0000 g | ORAL_CAPSULE | Freq: Every day | ORAL | Status: DC
  Administered 2015-11-21 – 2015-11-23 (×3): 1 g via ORAL
  Filled 2015-11-21 (×3): qty 1

## 2015-11-21 MED ORDER — FENTANYL CITRATE (PF) 100 MCG/2ML IJ SOLN: 50.0000 ug | INTRAMUSCULAR | Status: DC | PRN

## 2015-11-21 MED ORDER — ONDANSETRON HCL 4 MG/2ML IJ SOLN
4.0000 mg | Freq: Once | INTRAMUSCULAR | Status: AC
  Administered 2015-11-21: 4 mg via INTRAVENOUS
  Filled 2015-11-21: qty 2

## 2015-11-21 MED ORDER — SODIUM CHLORIDE 0.9 % IV BOLUS (SEPSIS)
100.0000 mL | Freq: Once | INTRAVENOUS | Status: AC
  Administered 2015-11-21: 100 mL via INTRAVENOUS

## 2015-11-21 MED ORDER — NITROGLYCERIN 0.4 MG SL SUBL: 0.4000 mg | SUBLINGUAL_TABLET | SUBLINGUAL | Status: DC | PRN

## 2015-11-21 MED ORDER — CYCLOBENZAPRINE HCL 10 MG PO TABS: 10.0000 mg | ORAL_TABLET | Freq: Three times a day (TID) | ORAL | Status: DC | PRN

## 2015-11-21 MED ORDER — ACETAMINOPHEN 650 MG RE SUPP
650.0000 mg | Freq: Four times a day (QID) | RECTAL | Status: DC | PRN
Start: 2015-11-21 — End: 2015-11-23

## 2015-11-21 MED ORDER — ISOSORBIDE MONONITRATE ER 30 MG PO TB24
30.0000 mg | ORAL_TABLET | Freq: Every day | ORAL | Status: DC
  Administered 2015-11-21 – 2015-11-23 (×3): 30 mg via ORAL
  Filled 2015-11-21 (×3): qty 1

## 2015-11-21 MED ORDER — FENTANYL CITRATE (PF) 100 MCG/2ML IJ SOLN
50.0000 ug | INTRAMUSCULAR | Status: DC | PRN
Start: 2015-11-21 — End: 2015-11-23
  Administered 2015-11-21 – 2015-11-23 (×8): 50 ug via INTRAVENOUS
  Filled 2015-11-21 (×8): qty 2

## 2015-11-21 MED ORDER — ACETAMINOPHEN 325 MG PO TABS: 650.0000 mg | ORAL_TABLET | Freq: Four times a day (QID) | ORAL | Status: DC | PRN

## 2015-11-21 MED ORDER — ALBUTEROL SULFATE HFA 108 (90 BASE) MCG/ACT IN AERS: 1.0000 | INHALATION_SPRAY | Freq: Four times a day (QID) | RESPIRATORY_TRACT | Status: DC | PRN

## 2015-11-21 MED ORDER — ATORVASTATIN CALCIUM 40 MG PO TABS
40.0000 mg | ORAL_TABLET | Freq: Every day | ORAL | Status: DC
  Administered 2015-11-21 – 2015-11-23 (×3): 40 mg via ORAL
  Filled 2015-11-21 (×3): qty 1

## 2015-11-21 MED ORDER — MONTELUKAST SODIUM 10 MG PO TABS
10.0000 mg | ORAL_TABLET | Freq: Every day | ORAL | Status: DC
  Administered 2015-11-21 – 2015-11-22 (×2): 10 mg via ORAL
  Filled 2015-11-21 (×2): qty 1

## 2015-11-21 MED ORDER — PANTOPRAZOLE SODIUM 40 MG PO TBEC
40.0000 mg | DELAYED_RELEASE_TABLET | Freq: Every morning | ORAL | Status: DC
  Administered 2015-11-21 – 2015-11-23 (×3): 40 mg via ORAL
  Filled 2015-11-21 (×3): qty 1

## 2015-11-21 NOTE — ED Notes (Signed)
Pt in wheelchair to bathroom

## 2015-11-21 NOTE — ED Notes (Signed)
From CT 

## 2015-11-21 NOTE — ED Notes (Signed)
hospitalist in to assess  

## 2015-11-21 NOTE — ED Notes (Signed)
[facility] arrived for transport 

## 2015-11-21 NOTE — H&P (Addendum)
History and Physical    BRYANNA YIM PIR:[id number] DOB: [birth year] DOA: 11/20/2015  PCP: Ernestine Conrad, MD  Patient coming from: Home.  Chief Complaint: Abdominal pain.  HPI: Olivia Wade is a 65 y.o. female with CAD status post stenting in March/April 2015 status post nuclear stress test in March 2017 which was unremarkable, abdominal aortic aneurysm being followed by Dr. Myra Gianotti, hypertension COPD and history of GERD status post fundoplication presents to ER because of worsening abdominal pain since last evening. Patient states she has been having low back pain for last 3-4 weeks abdominal pain started last evening sudden onset and severe. Denies any nausea vomiting or diarrhea and denies any chest pain shortness of breath. CT of the abdomen shows increasing size of the known abdominal aortic aneurysm from 5.5 cm to 6 cm with possible anterior ulceration. On-call vascular surgeon Dr. Myra Gianotti at [facility] was consulted by the ER physician. Patient will be transferred for further care to [facility] and patient is agreeable to transfer.   ED Course: Was started on IV fluids and pain medications.  Review of Systems: As per HPI, rest all negative.   Past Medical History  Diagnosis Date  . AAA (abdominal aortic aneurysm) (HCC)   . Tobacco abuse   . HTN (hypertension)   . COPD (chronic obstructive pulmonary disease) (HCC)   . Asthma   . HLD (hyperlipidemia)   . GERD (gastroesophageal reflux disease)   . Chronic low back pain     (Old vertebral fracture)  . Arthritis   . Coronary artery disease 09/08/13    with PCI  . NSTEMI (non-ST elevated myocardial infarction) (HCC) 09/08/13  . Heart palpitations     prn toprol for rapid HR  . History of surgery on arm     Past Surgical History  Procedure Laterality Date  . Coronary stent placement  09/08/13    PCI with cutting balloon, PTCA,DES to prox LAD  . Coronary angioplasty with stent placement  09/10/13    PCI of RCA 90%  proximal RCA stenosis with DES  . Laparoscopic nissen fundoplication      for severe reflux  . Cholecystectomy    . Abdominal hysterectomy    . 2d echo  12/2012    normal LV with mild LVH, grade 1 diastolic dysfunction  . Doppler echocardiography  12/31/2012  . Nuclear stress test  05/09/2011    NORMAL PATTREN OF PERFUSION IN ALL REGIONS. LV IS NORMAL IS SIZE. EF 68% NO WALL MOTION ABNORMALITIES.  Marland Kitchen Abdominal aortic duplex  05/19/2012    WHEN COMPARED TO PRIOR STUDY DATED 06/01/11 THERE IS AN INCREASE IN SIZE OF DILATATION.  Marland Kitchen Left heart catheterization with coronary angiogram N/A 09/08/2013    Procedure: LEFT HEART CATHETERIZATION WITH CORONARY ANGIOGRAM;  Surgeon: Lennette Bihari, MD;  Location: [facility] CATH LAB;  Service: Cardiovascular;  Laterality: N/A;  . Percutaneous coronary stent intervention (pci-s) N/A 09/10/2013    Procedure: PERCUTANEOUS CORONARY STENT INTERVENTION (PCI-S);  Surgeon: Lennette Bihari, MD;  Location: [facility] CATH LAB;  Service: Cardiovascular;  Laterality: N/A;     reports that she quit smoking about 2 years ago. Her smoking use included Cigarettes. She has a 18.5 pack-year smoking history. She has never used smokeless tobacco. She reports that she does not drink alcohol or use illicit drugs.  Allergies  Allergen Reactions  . Bee Venom Anaphylaxis, Hives and Swelling  . Penicillins Other (See Comments)    PATIENT REPORTS "MOTHER TOLD  HER SHE HAD ALLERGY TO PCN"  . Hydrocodone Other (See Comments)  . Iodine   . Morphine And Related Rash    Family History  Problem Relation Age of Onset  . Heart attack Father   . Cancer Father     Esophageal  . Heart disease Father     before age 25  . Heart attack Son 60  . Heart disease Son     before age 23  . Hypertension Mother     Prior to Admission medications   Medication Sig Start Date End Date Taking? Authorizing Provider  acetaminophen (TYLENOL) 325 MG tablet Take 325 mg by mouth every 6 (six) hours as needed for  moderate pain.    Historical Provider, MD  albuterol (PROVENTIL HFA;VENTOLIN HFA) 108 (90 BASE) MCG/ACT inhaler Inhale 1 puff into the lungs every 6 (six) hours as needed for wheezing or shortness of breath.    Historical Provider, MD  albuterol (PROVENTIL) (2.5 MG/3ML) 0.083% nebulizer solution Take 2.5 mg by nebulization every 6 (six) hours as needed for wheezing or shortness of breath.    Historical Provider, MD  ALPRAZolam Prudy Feeler) 0.5 MG tablet Take 0.5 mg by mouth 3 (three) times daily as needed for anxiety or sleep. *Patient is prescribed one tablets three times daily as needed for anxiety    Historical Provider, MD  aspirin EC 81 MG tablet Take 81 mg by mouth daily.    Historical Provider, MD  atorvastatin (LIPITOR) 40 MG tablet Take 1 tablet (40 mg total) by mouth daily. 11/11/15   Lennette Bihari, MD  citalopram (CELEXA) 20 MG tablet Take 20 mg by mouth daily.    Historical Provider, MD  cyclobenzaprine (FLEXERIL) 10 MG tablet Take 1 tablet (10 mg total) by mouth 3 (three) times daily as needed for muscle spasms.    Lennette Bihari, MD  folic acid (FOLVITE) 1 MG tablet Take 1 tablet (1 mg total) by mouth daily. 09/12/15   Lennette Bihari, MD  iron polysaccharides (NIFEREX) 150 MG capsule Take 1 capsule (150 mg total) by mouth daily. 09/12/15   Lennette Bihari, MD  isosorbide mononitrate (IMDUR) 30 MG 24 hr tablet Take 30 mg by mouth daily.    Historical Provider, MD  lisinopril (PRINIVIL,ZESTRIL) 5 MG tablet take 1 tablet by mouth once daily 05/04/15   Lennette Bihari, MD  MELATONIN PO Take 1 tablet by mouth daily as needed (sleep).    Historical Provider, MD  Menthol, Topical Analgesic, (ICY HOT BACK EX) Apply 1 application topically daily as needed (on back).    Historical Provider, MD  metoprolol succinate (TOPROL-XL) 50 MG 24 hr tablet Take 1 tablet (50 mg total) by mouth 2 (two) times daily. 11/02/15   Laqueta Linden, MD  montelukast (SINGULAIR) 10 MG tablet Take 10 mg by mouth at bedtime.     Historical Provider, MD  nitroGLYCERIN (NITROSTAT) 0.4 MG SL tablet Place 1 tablet (0.4 mg total) under the tongue every 5 (five) minutes as needed for chest pain. 08/15/15   Lennette Bihari, MD  Omega-3 Fatty Acids (FISH OIL EXTRA STRENGTH PO) Take by mouth.    Historical Provider, MD  pantoprazole (PROTONIX) 40 MG tablet Take 1 tablet (40 mg total) by mouth every morning. 11/25/14   Lennette Bihari, MD  ticagrelor (BRILINTA) 60 MG TABS tablet Take 1 tablet (60 mg total) by mouth 2 (two) times daily. 08/03/15   Lennette Bihari, MD    Physical Exam:  Filed Vitals:   11/21/15 0100 11/21/15 0130 11/21/15 0200 11/21/15 0230  BP: 84/62 125/94 108/74 108/78  Pulse: 81 82  74  Temp:      TempSrc:      Resp: 13 16 17 15   Height:      Weight:      SpO2: 95% 91%  85%      Constitutional: Not in distress. Filed Vitals:   11/21/15 0100 11/21/15 0130 11/21/15 0200 11/21/15 0230  BP: 84/62 125/94 108/74 108/78  Pulse: 81 82  74  Temp:      TempSrc:      Resp: 13 16 17 15   Height:      Weight:      SpO2: 95% 91%  85%   Eyes: Anicteric no pallor. ENMT: No discharge from the ears eyes nose or mouth. Neck: No mass felt. Respiratory: No rhonchi or crepitations. Cardiovascular: S1 and S2 heard. Abdomen: Soft nontender bowel sounds present. No guarding or rigidity. No mass felt. Musculoskeletal: No edema. Skin: No rash. Neurologic: Alert awake oriented to time place and person. Moves all extremities. Psychiatric: Appears normal.   Labs on Admission: I have personally reviewed following labs and imaging studies  CBC:  Recent Labs Lab 11/20/15 2350 11/21/15 0008  WBC 10.1  --   NEUTROABS 7.0  --   HGB 10.3* 10.5*  HCT 31.3* 31.0*  MCV 89.7  --   PLT 420*  --    Basic Metabolic Panel:  Recent Labs Lab 11/20/15 2350 11/21/15 0008  NA 134* 137  K 3.6 3.7  CL 104 102  CO2 22  --   GLUCOSE 113* 110*  BUN 9 8  CREATININE 0.75 0.70  CALCIUM 8.8*  --    GFR: Estimated  Creatinine Clearance: 69.2 mL/min (by C-G formula based on Cr of 0.7). Liver Function Tests:  Recent Labs Lab 11/20/15 2350  AST 14*  ALT 10*  ALKPHOS 80  BILITOT 0.3  PROT 8.0  ALBUMIN 3.2*    Recent Labs Lab 11/20/15 2350  LIPASE 23   No results for input(s): AMMONIA in the last 168 hours. Coagulation Profile:  Recent Labs Lab 11/20/15 2350  INR 1.06   Cardiac Enzymes:  Recent Labs Lab 11/20/15 2350  TROPONINI <0.03   BNP (last 3 results) No results for input(s): PROBNP in the last 8760 hours. HbA1C: No results for input(s): HGBA1C in the last 72 hours. CBG: No results for input(s): GLUCAP in the last 168 hours. Lipid Profile: No results for input(s): CHOL, HDL, LDLCALC, TRIG, CHOLHDL, LDLDIRECT in the last 72 hours. Thyroid Function Tests: No results for input(s): TSH, T4TOTAL, FREET4, T3FREE, THYROIDAB in the last 72 hours. Anemia Panel: No results for input(s): VITAMINB12, FOLATE, FERRITIN, TIBC, IRON, RETICCTPCT in the last 72 hours. Urine analysis:    Component Value Date/Time   COLORURINE YELLOW 11/21/2015 0125   APPEARANCEUR CLEAR 11/21/2015 0125   LABSPEC <1.005* 11/21/2015 0125   PHURINE 5.5 11/21/2015 0125   GLUCOSEU NEGATIVE 11/21/2015 0125   HGBUR TRACE* 11/21/2015 0125   BILIRUBINUR NEGATIVE 11/21/2015 0125   KETONESUR NEGATIVE 11/21/2015 0125   PROTEINUR NEGATIVE 11/21/2015 0125   NITRITE NEGATIVE 11/21/2015 0125   LEUKOCYTESUR SMALL* 11/21/2015 0125   Sepsis Labs: @LABRCNTIP (procalcitonin:4,lacticidven:4) )No results found for this or any previous visit (from the past 240 hour(s)).   Radiological Exams on Admission: Ct Angio Chest/abd/pel For Dissection W And/or W/wo  11/21/2015  CLINICAL DATA:  Acute onset of moderate severe lower abdominal pain and  nausea. Personal history of abdominal aortic aneurysm. Initial encounter. EXAM: CT ANGIOGRAPHY CHEST, ABDOMEN AND PELVIS TECHNIQUE: Multidetector CT imaging through the chest, abdomen  and pelvis was performed using the standard protocol during bolus administration of intravenous contrast. Multiplanar reconstructed images and MIPs were obtained and reviewed to evaluate the vascular anatomy. CONTRAST:  100 mL of Isovue 370 IV contrast COMPARISON:  None. FINDINGS: CTA CHEST FINDINGS There is no evidence of aortic dissection along the thoracic aorta. There is no evidence of aneurysmal dilatation. No calcific atherosclerotic disease noted along the thoracic aorta. There is no evidence of pulmonary embolus. Minimal bibasilar atelectasis is noted. Mild emphysematous change is noted at the lung apices. No pleural effusion or pneumothorax is seen. No masses are identified. There is no evidence of significant focal consolidation, pleural effusion or pneumothorax. No masses are identified; no abnormal focal contrast enhancement is seen. Diffuse coronary artery calcifications are seen. No mediastinal lymphadenopathy is appreciated. No pericardial effusion is seen. The great vessels are grossly unremarkable in appearance. No axillary lymphadenopathy is seen. The visualized portions of the thyroid gland are unremarkable in appearance. No acute osseous abnormalities are seen. There is chronic loss of height at vertebral bodies T5, T6, T7, T10, T11 and T12. Review of the MIP images confirms the above findings. CTA ABDOMEN AND PELVIS FINDINGS The irregular heterogeneous soft tissue density surrounding the abdominal aorta from the level of the renal arteries inferiorly just past the aortic bifurcation appears to have increased mildly in size, now measuring 6.0 cm in maximal diameter, compared to 5.5 cm on the prior study. Minimal surrounding soft tissue inflammation is seen. This is outside of the visualized calcification about the abdominal aorta, and may reflect chronic worsening intramural dissection, a large irregular mycotic aneurysm, or underlying diffuse aortitis. 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 This may be related to the surrounding finding, and is mildly more apparent than on the prior study. The liver and spleen are unremarkable in appearance. The patient is status post cholecystectomy, with clips noted at the gallbladder fossa. The pancreas and adrenal glands are unremarkable. Mild bilateral renal scarring is noted. A small right renal cyst is noted. Mild nonspecific left-sided perinephric stranding is noted. There is no evidence of hydronephrosis. No renal or ureteral stones are seen. No perinephric stranding is appreciated. No free fluid is identified. The small bowel is unremarkable in appearance. The stomach is within normal limits. No acute vascular abnormalities are seen. The appendix is not definitely characterized; there is no evidence for appendicitis. The colon is unremarkable in appearance. The bladder is moderately distended and grossly unremarkable. The patient is status post hysterectomy. No suspicious adnexal masses are seen. No inguinal lymphadenopathy is seen. No acute osseous abnormalities are identified. There is chronic loss of height involving vertebral bodies L1 through L4. Review of the MIP images confirms the above findings. IMPRESSION: 1. Irregular heterogeneous soft tissue density surrounding the abdominal aorta from the level of the renal arteries inferiorly just past the aortic bifurcation appears to have increased in size, now measuring 6.0 cm in maximal diameter, compared to 5.5 cm on the prior study. Minimal surrounding soft inflammation seen. This is outside of the visualized calcification about the abdominal aorta, and may reflect chronic worsening intramural dissection, a large irregular mycotic aneurysm, or underlying diffuse aortitis. No evidence of active contrast extravasation at this time. 2. Small focal  outpouching along the  anterior abdominal aorta just inferior to the renal arteries, suspicious for a penetrating aortic ulceration. This is mildly more apparent than on the prior study, and may be related to the surrounding finding. 3. No evidence of pulmonary embolus. 4. Minimal bibasilar atelectasis noted. Mild emphysematous change at the lung apices. 5. Diffuse coronary artery calcifications seen. 6. Mild bilateral renal scarring, and small right renal cyst. 7. Chronic loss of height involving multiple thoracic and lumbar vertebral bodies. These results were called by telephone at the time of interpretation on 11/21/2015 at 1:03 am to Dr. Rochele Raring, who verbally acknowledged these results. Electronically Signed   By: Roanna Raider M.D.   On: 11/21/2015 01:14    EKG: Independently reviewed. Normal sinus rhythm.  Assessment/Plan Principal Problem:   Aneurysm of infrarenal abdominal aorta (HCC) Active Problems:   HTN (hypertension)   Hyperlipidemia   Asthma with COPD   CAD (coronary artery disease), recent DES stents to RCA and LAD in 3/15 and 4/15   Abdominal aortic aneurysm (AAA) >39 mm diameter (HCC)    1. Abdominal pain with worsening abdominal aortic aneurysm with possible anterior penetration - at this time vascular surgeon Dr. Myra Gianotti vascular surgeon was consulted by the ER physician and Dr. Myra Gianotti has requested transfer to [facility].           Patient is unable to transfer and Dr. Katrinka Blazing will be the accepting physician. Patient will be kept nothing by mouth except medications in anticipation of possible procedure. Patient's antiplatelet agents including aspirin and BRILINTA will be on hold and may need to discuss with cardiologist in a.m. Patient's recent stress test in March 2017 was unremarkable. Continue with pain medications and IV fluids. 2. Hypertension - patient was mildly hypotensive on arrival which improved with fluids. Patient is on lisinopril and Imdur and metoprolol which may need to be  held if patient's blood pressure has further episodes of hypotension. For now I have also place patient on when necessary IV labetalol 5 mg for systolic blood pressure more than 140. 3. CAD status post stenting in March/April 2015 with stress test in March 2017 unremarkable - holding off antiplatelet agents secondary to #1. May discuss with cardiologist in a.m. 4. COPD status asthma not wheezing at this time continue nebulizers. 5. Hyperlipidemia on statins.   DVT prophylaxis: SCDs. Code Status: DO NOT  RESUSCITATE. Family Communication: Patient's husband.  Disposition Plan: Home.  Consults called: ER physician had discussed with Dr. Myra Gianotti vascular surgeon.  Admission status: Inpatient. Stepped on. Likely stay 2-3 days.    Eduard Clos MD [facility] Pager [phone].  If 7PM-7AM, please contact night-coverage [email or web address] Password [facility]  11/21/2015, 2:59 AM

## 2015-11-21 NOTE — Progress Notes (Addendum)
Chart reviewed. 65 year old woman with known aortic aneurysm present with abdominal pain. CT revealed increased size of aneurysm with possible ulceration. Case was discussed with vascular surgeon at [facility] who will see in consult, recommended blood pressure control and pain control. Stepdown bed requested and not yet available.  She reports pain is controlled at this point. Breathing well.  Afebrile, vital signs stable. No hypoxia. Appears calm and comfortable. Alert. Cardio vascular regular rate and rhythm. No murmur, rub or gallop. Dorsalis pedis pulses 2+ bilaterally. Respiratory clear to auscultation bilaterally. No wheezes, rales or rhonchi. Normal respiratory effort. Abdomen soft.  Assessment/plan Continue blood pressure monitoring, pain control. I discussed with bed control at [facility] and requested high priority for this patient. She continues to appear stable at this time.  Brendia Sacks, MD [facility] [phone]

## 2015-11-21 NOTE — ED Notes (Signed)
hospitalist paged regarding pt increased pain and her request for meds- second page

## 2015-11-21 NOTE — ED Notes (Signed)
Dr Elesa Massed modified order for NS to 100 cc hr

## 2015-11-21 NOTE — ED Notes (Signed)
Called [facility] to get an update on bed status, they are aware of pt, but still no beds available

## 2015-11-21 NOTE — ED Notes (Signed)
Ice chips provided to pt- pt will be hold in ED due to beds unavailable currently- Comfort measures, pt's spouse is at the bedside

## 2015-11-21 NOTE — ED Notes (Signed)
To CT via stretcher

## 2015-11-21 NOTE — ED Notes (Signed)
Pt awake and complaining of pain- hospitalist paged

## 2015-11-21 NOTE — ED Notes (Signed)
MD at the bedside to review DNR wishes and fill out the MOST form for transport, requested by [facility].

## 2015-11-21 NOTE — ED Notes (Signed)
Pt resting on right side. She reports that her pain is much improved. Her eyes are closed and her spouse is at bedside

## 2015-11-21 NOTE — Progress Notes (Signed)
Subjective: Interval History: none.. 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 Presented to the outlying emergency room last night with worsening pain. Was transferred to [facility] hospital and arrived here this morning. Dr. Myra Gianotti asked that I examine her since he is not in the hospital today. The plan was for her to have a fenestrated graft placed in approximately 2 weeks. The patient reports severe pain which is much better now she reports that she does have some lower abdominal pain and some back pain.   Objective: Vital signs in last 24 hours: Temp:  [97.6 F (36.4 C)-98.1 F (36.7 C)] 97.6 F (36.4 C) (06/12 1120) Pulse Rate:  [66-94] 79 (06/12 1120) Resp:  [13-26] 24 (06/12 1120) BP: (84-125)/(50-109) 105/64 mmHg (06/12 1120) SpO2:  [85 %-100 %] 97 % (06/12 1120) Weight:  [153 lb 8 oz (69.627 kg)-155 lb (70.308 kg)] 153 lb 8 oz (69.627 kg) (06/12 1120)  Intake/Output from previous day:   Intake/Output this shift:    Abdomen soft. Mild tenderness in the lower abdomen and some less so in the upper abdomen. No shake tenderness.  Lab Results:  Recent Labs  11/20/15 2350 11/21/15 0008 11/21/15 1205  WBC 10.1  --  7.2  HGB 10.3* 10.5* 9.8*  HCT 31.3* 31.0* 30.6*  PLT 420*  --  374   BMET  Recent Labs  11/20/15 2350 11/21/15 0008 11/21/15 1205  NA 134* 137 138  K 3.6 3.7 3.6  CL 104 102 110  CO2 22  --  18*  GLUCOSE 113* 110* 82  BUN CREATININE 0.75 0.70 0.71  CALCIUM 8.8*  --  8.5*    Studies/Results: Ct Angio Chest Aorta W/cm &/or Wo/cm  10/28/2015  CLINICAL DATA:  Abdominal aortic aneurysm EXAM: CTA CHEST ABDOMEN AND PELVIS wITHOUT AND WITH CONTRAST TECHNIQUE: Multidetector CT imaging of the chest, abdomen and pelvis was performed using the standard protocol during bolus administration of intravenous contrast. Multiplanar reconstructed images and MIPs were obtained and reviewed to  evaluate the vascular anatomy. CONTRAST:  75 cc Isovue 370 COMPARISON:  08/25/2015 FINDINGS: Chest: Maximal diameter of the ascending aorta is 3.6 cm. The arch and descending thoracic aorta are non aneurysmal. There is smooth minimal plaque in the proximal descending thoracic aorta. Minimal smooth plaque in the innominate artery. It is patent. Right subclavian and common carotid arteries are patent. Left common carotid artery is patent. Left subclavian artery is patent. Vertebral arteries are patent within the confines of the examination. No evidence of acute pulmonary thromboembolism. No evidence of abnormal mediastinal adenopathy. Thyroid is unremarkable. There is centrilobular emphysema towards the lung apices. Dependent atelectasis posteriorly. Tiny nodules in the right middle lobe. On image 62, 1 of them is 3 mm. 5 mm left lower lobe pulmonary nodule on image 79. These are stable in comparison to prior recent and remote studies. Stable mid thoracic compression deformities. Abdomen: Infrarenal abdominal aorta with a maximal transverse diameter of 5.5 cm is stable. There continues to be thickening of the wall of the aorta with haziness in the adjacent fat. The wall enhances on delayed imaging. These findings are compatible with inflammation of the wall or vasculitis. These findings are stable. Celiac is patent.  Branch vessels are non aneurysmal and patent. SMA is patent. IMA is severely diminutive with severe narrowing as the lumen passes through the thickened wall of the lower aortic aneurysm. Branch vessels are patent and diminutive. Single  renal arteries are patent. Bilateral common, internal, and external iliac arteries are patent and non aneurysmal. Postcholecystectomy Liver, spleen, pancreas, adrenal glands are within normal limits. Simple cysts in the right kidney. Chronic changes of the left kidney Uterus is absent. Adnexa are unremarkable. There is bladder prolapse towards the vagina. No focal mass of  the colon. Upper normal gas-filled loops of small bowel are in the left hemi abdomen. Upper lumbar and lower thoracic compression deformities are stable. Lumbosacral junction is transitional. No free-fluid.  No abnormal retroperitoneal adenopathy Review of the MIP images confirms the above findings. IMPRESSION: Within the thorax, there is no evidence of aortic aneurysm. Mild atherosclerotic changes are noted. Small bilateral pulmonary nodules are stable compatible with benign disease. Emphysema is noted Infrarenal abdominal aortic aneurysm is stable at 5.5 cm. There is persistent thickening and enhancement of the wall with inflammatory change compatible with inflammatory aneurysm or vasculitis. Stable compression deformities in the thoracic and lumbar spine. Electronically Signed   By: Jolaine Click M.D.   On: 10/28/2015 12:05   Ct Angio Chest/abd/pel For Dissection W And/or W/wo  11/21/2015  CLINICAL DATA:  Acute onset of moderate severe lower abdominal pain and nausea. Personal history of abdominal aortic aneurysm. Initial encounter. EXAM: CT ANGIOGRAPHY CHEST, ABDOMEN AND PELVIS TECHNIQUE: Multidetector CT imaging through the chest, abdomen and pelvis was performed using the standard protocol during bolus administration of intravenous contrast. Multiplanar reconstructed images and MIPs were obtained and reviewed to evaluate the vascular anatomy. CONTRAST:  100 mL of Isovue 370 IV contrast COMPARISON:  None. FINDINGS: CTA CHEST FINDINGS There is no evidence of aortic dissection along the thoracic aorta. There is no evidence of aneurysmal dilatation. No calcific atherosclerotic disease noted along the thoracic aorta. There is no evidence of pulmonary embolus. Minimal bibasilar atelectasis is noted. Mild emphysematous change is noted at the lung apices. No pleural effusion or pneumothorax is seen. No masses are identified. There is no evidence of significant focal consolidation, pleural effusion or pneumothorax.  No masses are identified; no abnormal focal contrast enhancement is seen. Diffuse coronary artery calcifications are seen. No mediastinal lymphadenopathy is appreciated. No pericardial effusion is seen. The great vessels are grossly unremarkable in appearance. No axillary lymphadenopathy is seen. The visualized portions of the thyroid gland are unremarkable in appearance. No acute osseous abnormalities are seen. There is chronic loss of height at vertebral bodies T5, T6, T7, T10, T11 and T12. Review of the MIP images confirms the above findings. CTA ABDOMEN AND PELVIS FINDINGS The irregular heterogeneous soft tissue density surrounding the abdominal aorta from the level of the renal arteries inferiorly just past the aortic bifurcation appears to have increased mildly in size, now measuring 6.0 cm in maximal diameter, compared to 5.5 cm on the prior study. Minimal surrounding soft tissue inflammation is seen. This is outside of the visualized calcification about the abdominal aorta, and may reflect chronic worsening intramural dissection, a large irregular mycotic aneurysm, or underlying diffuse aortitis. 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 This may be related to the surrounding finding, and is mildly more apparent than on the prior study. The liver and spleen are unremarkable in appearance. The patient is status post cholecystectomy, with clips noted at the gallbladder fossa. The pancreas and adrenal glands are unremarkable. Mild bilateral renal scarring is noted. A small right renal cyst is noted. Mild nonspecific left-sided perinephric stranding  is noted. There is no evidence of hydronephrosis. No renal or ureteral stones are seen. No perinephric stranding is appreciated. No free fluid is identified. The small bowel is unremarkable in appearance. The stomach is within normal  limits. No acute vascular abnormalities are seen. The appendix is not definitely characterized; there is no evidence for appendicitis. The colon is unremarkable in appearance. The bladder is moderately distended and grossly unremarkable. The patient is status post hysterectomy. No suspicious adnexal masses are seen. No inguinal lymphadenopathy is seen. No acute osseous abnormalities are identified. There is chronic loss of height involving vertebral bodies L1 through L4. Review of the MIP images confirms the above findings. IMPRESSION: 1. Irregular heterogeneous soft tissue density surrounding the abdominal aorta from the level of the renal arteries inferiorly just past the aortic bifurcation appears to have increased in size, now measuring 6.0 cm in maximal diameter, compared to 5.5 cm on the prior study. Minimal surrounding soft inflammation seen. This is outside of the visualized calcification about the abdominal aorta, and may reflect chronic worsening intramural dissection, a large irregular mycotic aneurysm, or underlying diffuse aortitis. No evidence of active contrast extravasation at this time. 2. Small focal outpouching along the anterior abdominal aorta just inferior to the renal arteries, suspicious for a penetrating aortic ulceration. This is mildly more apparent than on the prior study, and may be related to the surrounding finding. 3. No evidence of pulmonary embolus. 4. Minimal bibasilar atelectasis noted. Mild emphysematous change at the lung apices. 5. Diffuse coronary artery calcifications seen. 6. Mild bilateral renal scarring, and small right renal cyst. 7. Chronic loss of height involving multiple thoracic and lumbar vertebral bodies. These results were called by telephone at the time of interpretation on 11/21/2015 at 1:03 am to Dr. Rochele Raring, who verbally acknowledged these results. Electronically Signed   By: Roanna Raider M.D.   On: 11/21/2015 01:14   Ct Angio Abd/pel W/ And/or  W/o  10/28/2015  CLINICAL DATA:  Abdominal aortic aneurysm EXAM: CTA CHEST ABDOMEN AND PELVIS wITHOUT AND WITH CONTRAST TECHNIQUE: Multidetector CT imaging of the chest, abdomen and pelvis was performed using the standard protocol during bolus administration of intravenous contrast. Multiplanar reconstructed images and MIPs were obtained and reviewed to evaluate the vascular anatomy. CONTRAST:  75 cc Isovue 370 COMPARISON:  08/25/2015 FINDINGS: Chest: Maximal diameter of the ascending aorta is 3.6 cm. The arch and descending thoracic aorta are non aneurysmal. There is smooth minimal plaque in the proximal descending thoracic aorta. Minimal smooth plaque in the innominate artery. It is patent. Right subclavian and common carotid arteries are patent. Left common carotid artery is patent. Left subclavian artery is patent. Vertebral arteries are patent within the confines of the examination. No evidence of acute pulmonary thromboembolism. No evidence of abnormal mediastinal adenopathy. Thyroid is unremarkable. There is centrilobular emphysema towards the lung apices. Dependent atelectasis posteriorly. Tiny nodules in the right middle lobe. On image 62, 1 of them is 3 mm. 5 mm left lower lobe pulmonary nodule on image 79. These are stable in comparison to prior recent and remote studies. Stable mid thoracic compression deformities. Abdomen: Infrarenal abdominal aorta with a maximal transverse diameter of 5.5 cm is stable. There continues to be thickening of the wall of the aorta with haziness in the adjacent fat. The wall enhances on delayed imaging. These findings are compatible with inflammation of the wall or vasculitis. These findings are stable. Celiac is patent.  Branch vessels are non aneurysmal and patent. SMA  is patent. IMA is severely diminutive with severe narrowing as the lumen passes through the thickened wall of the lower aortic aneurysm. Branch vessels are patent and diminutive. Single renal arteries are  patent. Bilateral common, internal, and external iliac arteries are patent and non aneurysmal. Postcholecystectomy Liver, spleen, pancreas, adrenal glands are within normal limits. Simple cysts in the right kidney. Chronic changes of the left kidney Uterus is absent. Adnexa are unremarkable. There is bladder prolapse towards the vagina. No focal mass of the colon. Upper normal gas-filled loops of small bowel are in the left hemi abdomen. Upper lumbar and lower thoracic compression deformities are stable. Lumbosacral junction is transitional. No free-fluid.  No abnormal retroperitoneal adenopathy Review of the MIP images confirms the above findings. IMPRESSION: Within the thorax, there is no evidence of aortic aneurysm. Mild atherosclerotic changes are noted. Small bilateral pulmonary nodules are stable compatible with benign disease. Emphysema is noted Infrarenal abdominal aortic aneurysm is stable at 5.5 cm. There is persistent thickening and enhancement of the wall with inflammatory change compatible with inflammatory aneurysm or vasculitis. Stable compression deformities in the thoracic and lumbar spine. Electronically Signed   By: Jolaine Click M.D.   On: 10/28/2015 12:05   Anti-infectives: Anti-infectives    None      Assessment/Plan: s/p * No surgery found * Remains hemodynamically stable. H&H stable. CT scan showed no evidence of rupture or leak but does have expansion of her known inflammatory aneurysm. I discussed this with Dr. Myra Gianotti as well. The plan will be to continue to hold her Brilinta. Would be at high risk with a suprarenal clamp and her desire for DO NOT RESUSCITATE status. to   LOS: 0 days   Molleigh Huot 11/21/2015, 2:09 PM

## 2015-11-21 NOTE — ED Notes (Signed)
Pt report a bit of pain relief

## 2015-11-22 DIAGNOSIS — R1030 Lower abdominal pain, unspecified: Secondary | ICD-10-CM

## 2015-11-22 DIAGNOSIS — I2510 Atherosclerotic heart disease of native coronary artery without angina pectoris: Secondary | ICD-10-CM

## 2015-11-22 LAB — GLUCOSE, CAPILLARY
GLUCOSE-CAPILLARY: 102 mg/dL — AB (ref 65–99)
GLUCOSE-CAPILLARY: 102 mg/dL — AB (ref 65–99)
GLUCOSE-CAPILLARY: 108 mg/dL — AB (ref 65–99)
Glucose-Capillary: 103 mg/dL — ABNORMAL HIGH (ref 65–99)

## 2015-11-22 NOTE — Progress Notes (Signed)
    Subjective  -   Abdomen feels better today.   Physical Exam:  Abdomen: Soft, tender to deep palpation Respirations nonlabored Extremities warm and well perfused    Assessment/Plan:    Her stent graft is being specially made in [place].  This process usually takes about 6 weeks.  It was ordered about 3 weeks ago.  I have spoken with them and they are placing a Ross.  I hope that it will be available next week.  In the meantime if the patient continues to improve overnight, I would let her go home tomorrow.  I would also stop her Brilinta.  I think she is very high risk for an open operation and therefore we will try to get her by until we can fix this with a stent graft.  [name] 11/22/2015 12:01 PM --  Filed Vitals:   11/22/15 0730 11/22/15 1123  BP: 121/84 111/80  Pulse: 67 76  Temp: 98 F (36.7 C) 98 F (36.7 C)  Resp: 18 22    Intake/Output Summary (Last 24 hours) at 11/22/15 1201 Last data filed at 11/22/15 0739  Gross per 24 hour  Intake 1751.67 ml  Output   2200 ml  Net -448.33 ml     Laboratory CBC    Component Value Date/Time   WBC 7.2 11/21/2015 1205   HGB 9.8* 11/21/2015 1205   HCT 30.6* 11/21/2015 1205   PLT 374 11/21/2015 1205    BMET    Component Value Date/Time   NA 138 11/21/2015 1205   K 3.6 11/21/2015 1205   CL 110 11/21/2015 1205   CO2 18* 11/21/2015 1205   GLUCOSE 82 11/21/2015 1205   BUN 6 11/21/2015 1205   CREATININE 0.71 11/21/2015 1205   CREATININE 0.83 08/18/2015 1213   CALCIUM 8.5* 11/21/2015 1205   GFRNONAA >60 11/21/2015 1205   GFRAA >60 11/21/2015 1205    COAG Lab Results  Component Value Date   INR 1.06 11/20/2015   INR 1.11 04/04/2015   INR 0.98 09/08/2013   No results found for: PTT  Antibiotics Anti-infectives    None       V. Charlena Cross, M.D. [facility] Office: [phone] Pager:  [phone]

## 2015-11-22 NOTE — Progress Notes (Addendum)
PROGRESS NOTE                                                                                                                                                                                                             Patient Demographics:    Olivia Wade, is a 65 y.o. female, DOB - 1950/12/20, ZOX:[id number]  Admit date - 11/20/2015   Admitting Physician Eduard Clos, MD  Outpatient Primary MD for the patient is Ernestine Conrad, MD  LOS - 1   Chief Complaint  Patient presents with  . Abdominal Pain       Brief Narrative   65 y.o. female with CAD status post stenting in March/April 2015 status post nuclear stress test in March 2017 which was unremarkable, abdominal aortic aneurysm being followed by Dr. Myra Gianotti, hypertension COPD and history of GERD status post fundoplication presents to ER because of worsening abdominal pain ,CT revealed increased size of aneurysm with possible ulceration, Is free from any pain to [facility] for evaluation by vascular surgery.   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 - Further management per vascular surgery - Continue to hold brilinta. - Continue with blood pressure control   hypertension - Continue with home medication including lisinopril, Imdur and metoprolol - Blood pressure acceptable, on when necessary labetalol to keep  systolic blood pressure less than 140 secondary to her AAA.  CAD status post stenting in March/April 2015 with stress test in March 2017 unremarkable  - holding off antiplatelet agents secondary to expanding AAA.  COPD - No active wheezing, continue with nebs  Hyperlipidemia - Continue with statin  Code Status : DNR  Family Communication  : None at bedside  Disposition Plan  : Home  when cleared by vascular.  Consults  : vascular Sx  Procedures  : None  DVT Prophylaxis  :   SCDs   Lab Results  Component Value Date   PLT 374 11/21/2015    Antibiotics  :    Anti-infectives    None        Objective:   Filed Vitals:   11/22/15 0200 11/22/15 0400 11/22/15 0519 11/22/15 0730  BP: 100/64 116/76  121/84  Pulse: 62 65 71 67  Temp:  98.5 F (36.9 C)  98 F (36.7 C)  TempSrc:  Oral  Oral  Resp: 23 19 28 18   Height:      Weight:   70.761 kg (156 lb)   SpO2: 91% 97% 97% 98%    Wt Readings from Last 3 Encounters:  11/22/15 70.761 kg (156 lb)  10/28/15 69.854 kg (154 lb)  10/20/15 70.308 kg (155 lb)     Intake/Output Summary (Last 24 hours) at 11/22/15 1116 Last data filed at 11/22/15 0739  Gross per 24 hour  Intake 1751.67 ml  Output   2200 ml  Net -448.33 ml     Physical Exam  Awake Alert, Oriented X 3, No new F.N deficits, Normal affect [place].AT,PERRAL Supple Neck,No JVD, No cervical lymphadenopathy appriciated.  Symmetrical Chest wall movement, Good air movement bilaterally, CTAB RRR,No Gallops,Rubs or new Murmurs, No Parasternal Heave +ve B.Sounds, Abd Soft, No tenderness, No organomegaly appriciated, No rebound - guarding or rigidity. No Cyanosis, Clubbing or edema, No new Rash or bruise      Data Review:    CBC  Recent Labs Lab 11/20/15 2350 11/21/15 0008 11/21/15 1205  WBC 10.1  --  7.2  HGB 10.3* 10.5* 9.8*  HCT 31.3* 31.0* 30.6*  PLT 420*  --  374  MCV 89.7  --  89.2  MCH 29.5  --  28.6  MCHC 32.9  --  32.0  RDW 13.8  --  14.3    LYMPHSABS 2.3  --  2.5  MONOABS 0.7  --  0.8  EOSABS 0.1  --  0.1  BASOSABS 0.0  --  0.0    Chemistries   Recent Labs Lab 11/20/15 2350 11/21/15 0008 11/21/15 1205  NA 134* 137 138  K 3.6 3.7 3.6  CL 104 102 110  CO2 22  --  18*  GLUCOSE 113* 110* 82  BUN 9 8 6   CREATININE 0.75 0.70 0.71  CALCIUM 8.8*  --  8.5*  AST 14*  --  13*  ALT 10*  --  9*  ALKPHOS 80  --  67  BILITOT 0.3  --  0.5   ------------------------------------------------------------------------------------------------------------------ No results for input(s): CHOL, HDL, LDLCALC, TRIG, CHOLHDL, LDLDIRECT in the last 72 hours.  Lab Results  Component Value Date   HGBA1C 5.2 12/22/2013   ------------------------------------------------------------------------------------------------------------------ No results for input(s): TSH, T4TOTAL, T3FREE, THYROIDAB in the last 72 hours.  Invalid input(s): FREET3 ------------------------------------------------------------------------------------------------------------------ No results for input(s): VITAMINB12, FOLATE, FERRITIN, TIBC, IRON, RETICCTPCT in the last 72 hours.  Coagulation profile  Recent Labs Lab 11/20/15 2350  INR 1.06    No results for input(s): DDIMER in the last 72 hours.  Cardiac Enzymes  Recent Labs Lab 11/20/15 2350  TROPONINI <0.03   ------------------------------------------------------------------------------------------------------------------ No results found for: BNP  Inpatient Medications  Scheduled Meds: . atorvastatin  40 mg Oral Daily  . citalopram  20 mg Oral Daily  . folic acid  1 mg Oral Daily  . iron polysaccharides  150 mg  Oral Daily  . isosorbide mononitrate  30 mg Oral Daily  . lisinopril  5 mg Oral Daily  . metoprolol succinate  50 mg Oral BID  . montelukast  10 mg Oral QHS  . omega-3 acid ethyl esters  1 g Oral Daily  . pantoprazole  40 mg Oral q morning - 10a   Continuous Infusions: . sodium  chloride 100 mL/hr at 11/22/15 0047   PRN Meds:.acetaminophen **OR** acetaminophen, albuterol, ALPRAZolam, cyclobenzaprine, fentaNYL (SUBLIMAZE) injection, labetalol, nitroGLYCERIN, ondansetron **OR** ondansetron (ZOFRAN) IV  Micro Results Recent Results (from the past 240 hour(s))  MRSA PCR Screening     Status: None   Collection Time: 11/21/15 11:11 AM  Result Value Ref Range Status   MRSA by PCR NEGATIVE NEGATIVE Final    Comment:        The GeneXpert MRSA Assay (FDA approved for NASAL specimens only), is one component of a comprehensive MRSA colonization surveillance program. It is not intended to diagnose MRSA infection nor to guide or monitor treatment for MRSA infections. Performed at [facility]     Radiology Reports Ct Angio Chest Aorta W/cm &/or Wo/cm  10/28/2015  CLINICAL DATA:  Abdominal aortic aneurysm EXAM: CTA CHEST ABDOMEN AND PELVIS wITHOUT AND WITH CONTRAST TECHNIQUE: Multidetector CT imaging of the chest, abdomen and pelvis was performed using the standard protocol during bolus administration of intravenous contrast. Multiplanar reconstructed images and MIPs were obtained and reviewed to evaluate the vascular anatomy. CONTRAST:  75 cc Isovue 370 COMPARISON:  08/25/2015 FINDINGS: Chest: Maximal diameter of the ascending aorta is 3.6 cm. The arch and descending thoracic aorta are non aneurysmal. There is smooth minimal plaque in the proximal descending thoracic aorta. Minimal smooth plaque in the innominate artery. It is patent. Right subclavian and common carotid arteries are patent. Left common carotid artery is patent. Left subclavian artery is patent. Vertebral arteries are patent within the confines of the examination. No evidence of acute pulmonary thromboembolism. No evidence of abnormal mediastinal adenopathy. Thyroid is unremarkable. There is centrilobular emphysema towards the lung apices. Dependent atelectasis posteriorly. Tiny nodules in the  right middle lobe. On image 62, 1 of them is 3 mm. 5 mm left lower lobe pulmonary nodule on image 79. These are stable in comparison to prior recent and remote studies. Stable mid thoracic compression deformities. Abdomen: Infrarenal abdominal aorta with a maximal transverse diameter of 5.5 cm is stable. There continues to be thickening of the wall of the aorta with haziness in the adjacent fat. The wall enhances on delayed imaging. These findings are compatible with inflammation of the wall or vasculitis. These findings are stable. Celiac is patent.  Branch vessels are non aneurysmal and patent. SMA is patent. IMA is severely diminutive with severe narrowing as the lumen passes through the thickened wall of the lower aortic aneurysm. Branch vessels are patent and diminutive. Single renal arteries are patent. Bilateral common, internal, and external iliac arteries are patent and non aneurysmal. Postcholecystectomy Liver, spleen, pancreas, adrenal glands are within normal limits. Simple cysts in the right kidney. Chronic changes of the left kidney Uterus is absent. Adnexa are unremarkable. There is bladder prolapse towards the vagina. No focal mass of the colon. Upper normal gas-filled loops of small bowel are in the left hemi abdomen. Upper lumbar and lower thoracic compression deformities are stable. Lumbosacral junction is transitional. No free-fluid.  No abnormal retroperitoneal adenopathy Review of the MIP images confirms the above findings. IMPRESSION: Within the thorax, there is no  evidence of aortic aneurysm. Mild atherosclerotic changes are noted. Small bilateral pulmonary nodules are stable compatible with benign disease. Emphysema is noted Infrarenal abdominal aortic aneurysm is stable at 5.5 cm. There is persistent thickening and enhancement of the wall with inflammatory change compatible with inflammatory aneurysm or vasculitis. Stable compression deformities in the thoracic and lumbar spine.  Electronically Signed   By: Jolaine Click M.D.   On: 10/28/2015 12:05   Ct Angio Chest/abd/pel For Dissection W And/or W/wo  11/21/2015  CLINICAL DATA:  Acute onset of moderate severe lower abdominal pain and nausea. Personal history of abdominal aortic aneurysm. Initial encounter. EXAM: CT ANGIOGRAPHY CHEST, ABDOMEN AND PELVIS TECHNIQUE: Multidetector CT imaging through the chest, abdomen and pelvis was performed using the standard protocol during bolus administration of intravenous contrast. Multiplanar reconstructed images and MIPs were obtained and reviewed to evaluate the vascular anatomy. CONTRAST:  100 mL of Isovue 370 IV contrast COMPARISON:  None. FINDINGS: CTA CHEST FINDINGS There is no evidence of aortic dissection along the thoracic aorta. There is no evidence of aneurysmal dilatation. No calcific atherosclerotic disease noted along the thoracic aorta. There is no evidence of pulmonary embolus. Minimal bibasilar atelectasis is noted. Mild emphysematous change is noted at the lung apices. No pleural effusion or pneumothorax is seen. No masses are identified. There is no evidence of significant focal consolidation, pleural effusion or pneumothorax. No masses are identified; no abnormal focal contrast enhancement is seen. Diffuse coronary artery calcifications are seen. No mediastinal lymphadenopathy is appreciated. No pericardial effusion is seen. The great vessels are grossly unremarkable in appearance. No axillary lymphadenopathy is seen. The visualized portions of the thyroid gland are unremarkable in appearance. No acute osseous abnormalities are seen. There is chronic loss of height at vertebral bodies T5, T6, T7, T10, T11 and T12. Review of the MIP images confirms the above findings. CTA ABDOMEN AND PELVIS FINDINGS The irregular heterogeneous soft tissue density surrounding the abdominal aorta from the level of the renal arteries inferiorly just past the aortic bifurcation appears to have increased  mildly in size, now measuring 6.0 cm in maximal diameter, compared to 5.5 cm on the prior study. Minimal surrounding soft tissue inflammation is seen. This is outside of the visualized calcification about the abdominal aorta, and may reflect chronic worsening intramural dissection, a large irregular mycotic aneurysm, or underlying diffuse aortitis. 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 This may be related to the surrounding finding, and is mildly more apparent than on the prior study. The liver and spleen are unremarkable in appearance. The patient is status post cholecystectomy, with clips noted at the gallbladder fossa. The pancreas and adrenal glands are unremarkable. Mild bilateral renal scarring is noted. A small right renal cyst is noted. Mild nonspecific left-sided perinephric stranding is noted. There is no evidence of hydronephrosis. No renal or ureteral stones are seen. No perinephric stranding is appreciated. No free fluid is identified. The small bowel is unremarkable in appearance. The stomach is within normal limits. No acute vascular abnormalities are seen. The appendix is not definitely characterized; there is no evidence for appendicitis. The colon is unremarkable in appearance. The bladder is moderately distended and grossly unremarkable. The patient is status post hysterectomy. No suspicious adnexal masses are seen. No inguinal lymphadenopathy is seen. No acute osseous abnormalities are identified. There is chronic loss of height involving vertebral bodies L1 through L4. Review  of the MIP images confirms the above findings. IMPRESSION: 1. Irregular heterogeneous soft tissue density surrounding the abdominal aorta from the level of the renal arteries inferiorly just past the aortic bifurcation appears to have increased in size, now measuring 6.0 cm in maximal  diameter, compared to 5.5 cm on the prior study. Minimal surrounding soft inflammation seen. This is outside of the visualized calcification about the abdominal aorta, and may reflect chronic worsening intramural dissection, a large irregular mycotic aneurysm, or underlying diffuse aortitis. No evidence of active contrast extravasation at this time. 2. Small focal outpouching along the anterior abdominal aorta just inferior to the renal arteries, suspicious for a penetrating aortic ulceration. This is mildly more apparent than on the prior study, and may be related to the surrounding finding. 3. No evidence of pulmonary embolus. 4. Minimal bibasilar atelectasis noted. Mild emphysematous change at the lung apices. 5. Diffuse coronary artery calcifications seen. 6. Mild bilateral renal scarring, and small right renal cyst. 7. Chronic loss of height involving multiple thoracic and lumbar vertebral bodies. These results were called by telephone at the time of interpretation on 11/21/2015 at 1:03 am to Dr. Rochele Raring, who verbally acknowledged these results. Electronically Signed   By: Roanna Raider M.D.   On: 11/21/2015 01:14   Ct Angio Abd/pel W/ And/or W/o  10/28/2015  CLINICAL DATA:  Abdominal aortic aneurysm EXAM: CTA CHEST ABDOMEN AND PELVIS wITHOUT AND WITH CONTRAST TECHNIQUE: Multidetector CT imaging of the chest, abdomen and pelvis was performed using the standard protocol during bolus administration of intravenous contrast. Multiplanar reconstructed images and MIPs were obtained and reviewed to evaluate the vascular anatomy. CONTRAST:  75 cc Isovue 370 COMPARISON:  08/25/2015 FINDINGS: Chest: Maximal diameter of the ascending aorta is 3.6 cm. The arch and descending thoracic aorta are non aneurysmal. There is smooth minimal plaque in the proximal descending thoracic aorta. Minimal smooth plaque in the innominate artery. It is patent. Right subclavian and common carotid arteries are patent. Left common  carotid artery is patent. Left subclavian artery is patent. Vertebral arteries are patent within the confines of the examination. No evidence of acute pulmonary thromboembolism. No evidence of abnormal mediastinal adenopathy. Thyroid is unremarkable. There is centrilobular emphysema towards the lung apices. Dependent atelectasis posteriorly. Tiny nodules in the right middle lobe. On image 62, 1 of them is 3 mm. 5 mm left lower lobe pulmonary nodule on image 79. These are stable in comparison to prior recent and remote studies. Stable mid thoracic compression deformities. Abdomen: Infrarenal abdominal aorta with a maximal transverse diameter of 5.5 cm is stable. There continues to be thickening of the wall of the aorta with haziness in the adjacent fat. The wall enhances on delayed imaging. These findings are compatible with inflammation of the wall or vasculitis. These findings are stable. Celiac is patent.  Branch vessels are non aneurysmal and patent. SMA is patent. IMA is severely diminutive with severe narrowing as the lumen passes through the thickened wall of the lower aortic aneurysm. Branch vessels are patent and diminutive. Single renal arteries are patent. Bilateral common, internal, and external iliac arteries are patent and non aneurysmal. Postcholecystectomy Liver, spleen, pancreas, adrenal glands are within normal limits. Simple cysts in the right kidney. Chronic changes of the left kidney Uterus is absent. Adnexa are unremarkable. There is bladder prolapse towards the vagina. No focal mass of the colon. Upper normal gas-filled loops of small bowel are in the left hemi abdomen. Upper lumbar and lower thoracic compression deformities  are stable. Lumbosacral junction is transitional. No free-fluid.  No abnormal retroperitoneal adenopathy Review of the MIP images confirms the above findings. IMPRESSION: Within the thorax, there is no evidence of aortic aneurysm. Mild atherosclerotic changes are noted.  Small bilateral pulmonary nodules are stable compatible with benign disease. Emphysema is noted Infrarenal abdominal aortic aneurysm is stable at 5.5 cm. There is persistent thickening and enhancement of the wall with inflammatory change compatible with inflammatory aneurysm or vasculitis. Stable compression deformities in the thoracic and lumbar spine. Electronically Signed   By: Jolaine Click M.D.   On: 10/28/2015 12:05    Time Spent in minutes  25 minutes   Ashtynn Berke M.D on 11/22/2015 at 11:16 AM  Between 7am to 7pm - Pager - [phone]  After 7pm go to [email or web address] - password [facility]  [facility] -  Office  [phone]

## 2015-11-23 DIAGNOSIS — I10 Essential (primary) hypertension: Secondary | ICD-10-CM

## 2015-11-23 DIAGNOSIS — I714 Abdominal aortic aneurysm, without rupture: Principal | ICD-10-CM

## 2015-11-23 DIAGNOSIS — E785 Hyperlipidemia, unspecified: Secondary | ICD-10-CM

## 2015-11-23 LAB — GLUCOSE, CAPILLARY: Glucose-Capillary: 107 mg/dL — ABNORMAL HIGH (ref 65–99)

## 2015-11-23 NOTE — Progress Notes (Signed)
Received patient from Elyse Jarvis, RN and she stated that patient is being discharged.  Discharged instructions and follow-up appointment discussed with patient and husband.  Both verbalized understanding.   Wheeled by NT to private vehicle per wheelchair.  Patient is in stab le condition, alert and oriented during this discharge.

## 2015-11-23 NOTE — Progress Notes (Signed)
    Subjective  -   Abdominal pain better Wants to go home   Physical Exam:  Abd:  Soft tender to deep  palpation       Assessment/Plan:    I am OK with d/c home.  Custom AAA graft being expedited from [place]. Continue to hold Brilinta Patient knows to come back t ER if pain returns  Evangela Heffler, [place] 11/23/2015 11:42 AM --  Filed Vitals:   11/23/15 0449 11/23/15 0800  BP: 117/69 112/86  Pulse: 74 79  Temp: 98.4 F (36.9 C) 98.4 F (36.9 C)  Resp: 17 21    Intake/Output Summary (Last 24 hours) at 11/23/15 1142 Last data filed at 11/23/15 0551  Gross per 24 hour  Intake    720 ml  Output   1800 ml  Net  -1080 ml     Laboratory CBC    Component Value Date/Time   WBC 7.2 11/21/2015 1205   HGB 9.8* 11/21/2015 1205   HCT 30.6* 11/21/2015 1205   PLT 374 11/21/2015 1205    BMET    Component Value Date/Time   NA 138 11/21/2015 1205   K 3.6 11/21/2015 1205   CL 110 11/21/2015 1205   CO2 18* 11/21/2015 1205   GLUCOSE 82 11/21/2015 1205   BUN 6 11/21/2015 1205   CREATININE 0.71 11/21/2015 1205   CREATININE 0.83 08/18/2015 1213   CALCIUM 8.5* 11/21/2015 1205   GFRNONAA >60 11/21/2015 1205   GFRAA >60 11/21/2015 1205    COAG Lab Results  Component Value Date   INR 1.06 11/20/2015   INR 1.11 04/04/2015   INR 0.98 09/08/2013   No results found for: PTT  Antibiotics Anti-infectives    None       V. Charlena Cross, M.D. [facility] Office: [phone] Pager:  [phone]

## 2015-11-23 NOTE — Discharge Summary (Signed)
Discharge Summary  Olivia Wade:[id number] DOB: Sep 29, 1950  PCP: Ernestine Conrad, MD  Admit date: 11/20/2015 Discharge date: 11/23/2015  Time spent: <24mins  Recommendations for Outpatient Follow-up:  1. F/u with PMD within a week  for hospital discharge follow up, repeat cbc/bmp at follow up 2. F/u with vascular surgery Dr Myra Gianotti, hold asa, hold brilinta   Discharge Diagnoses:  Active Hospital Problems   Diagnosis Date Noted  . Aneurysm of infrarenal abdominal aorta (HCC) 11/21/2015  . Abdominal aortic aneurysm (AAA) >39 mm diameter (HCC) 11/21/2015  . CAD (coronary artery disease), recent DES stents to RCA and LAD in 3/15 and 4/15 09/14/2013  . Hyperlipidemia 05/26/2013  . HTN (hypertension) 05/26/2013  . Asthma with COPD 05/26/2013    Resolved Hospital Problems   Diagnosis Date Noted Date Resolved  No resolved problems to display.    Discharge Condition: stable  Diet recommendation: heart healthy  Filed Weights   11/21/15 1120 11/22/15 0519 11/23/15 0449  Weight: 69.627 kg (153 lb 8 oz) 70.761 kg (156 lb) 72.213 kg (159 lb 3.2 oz)    History of present illness:  Chief Complaint: Abdominal pain.  HPI: Olivia Wade is a 65 y.o. female with CAD status post stenting in March/April 2015 status post nuclear stress test in March 2017 which was unremarkable, abdominal aortic aneurysm being followed by Dr. Myra Gianotti, hypertension COPD and history of GERD status post fundoplication presents to ER because of worsening abdominal pain since last evening. Patient states she has been having low back pain for last 3-4 weeks abdominal pain started last evening sudden onset and severe. Denies any nausea vomiting or diarrhea and denies any chest pain shortness of breath. CT of the abdomen shows increasing size of the known abdominal aortic aneurysm from 5.5 cm to 6 cm with possible anterior ulceration. On-call vascular surgeon Dr. Myra Gianotti at [facility] was consulted by the ER physician.  Patient will be transferred for further care to [facility] and patient is agreeable to transfer.   ED Course: Was started on IV fluids and pain medications.  Hospital Course:  Principal Problem:   Aneurysm of infrarenal abdominal aorta (HCC) Active Problems:   HTN (hypertension)   Hyperlipidemia   Asthma with COPD   CAD (coronary artery disease), recent DES stents to RCA and LAD in 3/15 and 4/15   Abdominal aortic aneurysm (AAA) >39 mm diameter (HCC)   Abdominal pain with worsening abdominal aortic aneurysm  - Vascular surgery consult greatly appreciated, no evidence of rupture or leak, but does have expansion of her known inflammatory aneurysm. - Continue to hold brilinta and asa - Continue with blood pressure control -vascular surgery oked patient to go home with continue holding asa and brilinta, patient will have surgery once stent available, patient understands she should return to ER if ab pain worsening  hypertension - Continue with home medication including lisinopril, Imdur and metoprolol - Blood pressure acceptable, on when necessary labetalol to keep systolic blood pressure less than 140 secondary to her AAA.  CAD status post stenting in March/April 2015 with stress test in March 2017 unremarkable  - holding off antiplatelet agents secondary to expanding AAA.  COPD - No active wheezing, continue with nebs  Hyperlipidemia - Continue with statin  Code Status : DNR  Family Communication : patient and husband  Disposition Plan : Home on 6/14,  oked by vascular.  Consults : vascular Sx  Procedures : None     Recent Labs  Lab Results  Component Value Date   PLT 374 11/21/2015      Antibiotics :   Anti-infectives    None         Discharge Exam: BP 112/86 mmHg  Pulse 79  Temp(Src) 98.4 F (36.9 C) (Oral)  Resp 21  Ht  (1.626 m)  Wt 72.213 kg (159 lb 3.2 oz)  BMI 27.31 kg/m2  SpO2 99%  General: aaox3,  pleasant  Cardiovascular: RRR Respiratory: CTABL  Discharge Instructions You were cared for by a hospitalist during your hospital stay. If you have any questions about your discharge medications or the care you received while you were in the hospital after you are discharged, you can call the unit and asked to speak with the hospitalist on call if the hospitalist that took care of you is not available. Once you are discharged, your primary care physician will handle any further medical issues. Please note that NO REFILLS for any discharge medications will be authorized once you are discharged, as it is imperative that you return to your primary care physician (or establish a relationship with a primary care physician if you do not have one) for your aftercare needs so that they can reassess your need for medications and monitor your lab values.      Discharge Instructions    Diet - low sodium heart healthy    Complete by:  As directed      Increase activity slowly    Complete by:  As directed             Medication List    STOP taking these medications        aspirin EC 81 MG tablet     ticagrelor 60 MG Tabs tablet  Commonly known as:  BRILINTA      TAKE these medications        acetaminophen 325 MG tablet  Commonly known as:  TYLENOL  Take 325 mg by mouth every 6 (six) hours as needed for moderate pain.     albuterol 108 (90 Base) MCG/ACT inhaler  Commonly known as:  PROVENTIL HFA;VENTOLIN HFA  Inhale 1 puff into the lungs every 6 (six) hours as needed for wheezing or shortness of breath.     albuterol (2.5 MG/3ML) 0.083% nebulizer solution  Commonly known as:  PROVENTIL  Take 2.5 mg by nebulization every 6 (six) hours as needed for wheezing or shortness of breath.     ALPRAZolam 0.5 MG tablet  Commonly known as:  XANAX  Take 0.5 mg by mouth 3 (three) times daily as needed for anxiety or sleep. *Patient is prescribed one tablets three times daily as needed for anxiety       atorvastatin 40 MG tablet  Commonly known as:  LIPITOR  Take 1 tablet (40 mg total) by mouth daily.     citalopram 20 MG tablet  Commonly known as:  CELEXA  Take 20 mg by mouth daily.     cyclobenzaprine 10 MG tablet  Commonly known as:  FLEXERIL  Take 1 tablet (10 mg total) by mouth 3 (three) times daily as needed for muscle spasms.     FISH OIL EXTRA STRENGTH PO  Take by mouth.     folic acid 1 MG tablet  Commonly known as:  FOLVITE  Take 1 tablet (1 mg total) by mouth daily.     ICY HOT BACK EX  Apply 1 application topically daily as needed (on back).  iron polysaccharides 150 MG capsule  Commonly known as:  NIFEREX  Take 1 capsule (150 mg total) by mouth daily.     isosorbide mononitrate 30 MG 24 hr tablet  Commonly known as:  IMDUR  Take 30 mg by mouth daily.     lisinopril 5 MG tablet  Commonly known as:  PRINIVIL,ZESTRIL  take 1 tablet by mouth once daily     MELATONIN PO  Take 1 tablet by mouth daily as needed (sleep).     metoprolol succinate 50 MG 24 hr tablet  Commonly known as:  TOPROL-XL  Take 1 tablet (50 mg total) by mouth 2 (two) times daily.     montelukast 10 MG tablet  Commonly known as:  SINGULAIR  Take 10 mg by mouth at bedtime.     nitroGLYCERIN 0.4 MG SL tablet  Commonly known as:  NITROSTAT  Place 1 tablet (0.4 mg total) under the tongue every 5 (five) minutes as needed for chest pain.     pantoprazole 40 MG tablet  Commonly known as:  PROTONIX  Take 1 tablet (40 mg total) by mouth every morning.     VITAMIN D PO  Take 1 tablet by mouth daily.       Allergies  Allergen Reactions  . Bee Venom Anaphylaxis, Hives and Swelling  . Penicillins Other (See Comments)   Follow-up Information    Follow up with Ernestine Conrad, MD In 1 week.   Specialty:  Family Medicine   Why:  hospital discharge follow up   Contact information:   [address] [place] [address] [phone]       Follow up with [name], MD In 3  weeks.   Specialties:  Vascular Surgery, Cardiology   Contact information:   [address] [place] [address] [phone]       Follow up with [facility] EMERGENCY DEPARTMENT.   Specialty:  Emergency Medicine   Why:  return to the ED if abdominal pain worsening   Contact information:   [address] [id number] mc [place] [address] [phone]       The results of significant diagnostics from this hospitalization (including imaging, microbiology, ancillary and laboratory) are listed below for reference.    Significant Diagnostic Studies: Ct Angio Chest Aorta W/cm &/or Wo/cm  10/28/2015  CLINICAL DATA:  Abdominal aortic aneurysm EXAM: CTA CHEST ABDOMEN AND PELVIS wITHOUT AND WITH CONTRAST TECHNIQUE: Multidetector CT imaging of the chest, abdomen and pelvis was performed using the standard protocol during bolus administration of intravenous contrast. Multiplanar reconstructed images and MIPs were obtained and reviewed to evaluate the vascular anatomy. CONTRAST:  75 cc Isovue 370 COMPARISON:  08/25/2015 FINDINGS: Chest: Maximal diameter of the ascending aorta is 3.6 cm. The arch and descending thoracic aorta are non aneurysmal. There is smooth minimal plaque in the proximal descending thoracic aorta. Minimal smooth plaque in the innominate artery. It is patent. Right subclavian and common carotid arteries are patent. Left common carotid artery is patent. Left subclavian artery is patent. Vertebral arteries are patent within the confines of the examination. No evidence of acute pulmonary thromboembolism. No evidence of abnormal mediastinal adenopathy. Thyroid is unremarkable. There is centrilobular emphysema towards the lung apices. Dependent atelectasis posteriorly. Tiny nodules in the right middle lobe. On image 62, 1 of them is 3 mm. 5 mm left lower lobe pulmonary nodule on image 79. These are stable in comparison to prior recent and remote studies.  Stable mid  thoracic compression deformities. Abdomen: Infrarenal abdominal aorta with a maximal transverse diameter of 5.5 cm is stable. There continues to be thickening of the wall of the aorta with haziness in the adjacent fat. The wall enhances on delayed imaging. These findings are compatible with inflammation of the wall or vasculitis. These findings are stable. Celiac is patent.  Branch vessels are non aneurysmal and patent. SMA is patent. IMA is severely diminutive with severe narrowing as the lumen passes through the thickened wall of the lower aortic aneurysm. Branch vessels are patent and diminutive. Single renal arteries are patent. Bilateral common, internal, and external iliac arteries are patent and non aneurysmal. Postcholecystectomy Liver, spleen, pancreas, adrenal glands are within normal limits. Simple cysts in the right kidney. Chronic changes of the left kidney Uterus is absent. Adnexa are unremarkable. There is bladder prolapse towards the vagina. No focal mass of the colon. Upper normal gas-filled loops of small bowel are in the left hemi abdomen. Upper lumbar and lower thoracic compression deformities are stable. Lumbosacral junction is transitional. No free-fluid.  No abnormal retroperitoneal adenopathy Review of the MIP images confirms the above findings. IMPRESSION: Within the thorax, there is no evidence of aortic aneurysm. Mild atherosclerotic changes are noted. Small bilateral pulmonary nodules are stable compatible with benign disease. Emphysema is noted Infrarenal abdominal aortic aneurysm is stable at 5.5 cm. There is persistent thickening and enhancement of the wall with inflammatory change compatible with inflammatory aneurysm or vasculitis. Stable compression deformities in the thoracic and lumbar spine. Electronically Signed   By: Jolaine Click M.D.   On: 10/28/2015 12:05   Ct Angio Chest/abd/pel For Dissection W And/or W/wo  11/21/2015  CLINICAL DATA:  Acute onset of moderate  severe lower abdominal pain and nausea. Personal history of abdominal aortic aneurysm. Initial encounter. EXAM: CT ANGIOGRAPHY CHEST, ABDOMEN AND PELVIS TECHNIQUE: Multidetector CT imaging through the chest, abdomen and pelvis was performed using the standard protocol during bolus administration of intravenous contrast. Multiplanar reconstructed images and MIPs were obtained and reviewed to evaluate the vascular anatomy. CONTRAST:  100 mL of Isovue 370 IV contrast COMPARISON:  None. FINDINGS: CTA CHEST FINDINGS There is no evidence of aortic dissection along the thoracic aorta. There is no evidence of aneurysmal dilatation. No calcific atherosclerotic disease noted along the thoracic aorta. There is no evidence of pulmonary embolus. Minimal bibasilar atelectasis is noted. Mild emphysematous change is noted at the lung apices. No pleural effusion or pneumothorax is seen. No masses are identified. There is no evidence of significant focal consolidation, pleural effusion or pneumothorax. No masses are identified; no abnormal focal contrast enhancement is seen. Diffuse coronary artery calcifications are seen. No mediastinal lymphadenopathy is appreciated. No pericardial effusion is seen. The great vessels are grossly unremarkable in appearance. No axillary lymphadenopathy is seen. The visualized portions of the thyroid gland are unremarkable in appearance. No acute osseous abnormalities are seen. There is chronic loss of height at vertebral bodies T5, T6, T7, T10, T11 and T12. Review of the MIP images confirms the above findings. CTA ABDOMEN AND PELVIS FINDINGS The irregular heterogeneous soft tissue density surrounding the abdominal aorta from the level of the renal arteries inferiorly just past the aortic bifurcation appears to have increased mildly in size, now measuring 6.0 cm in maximal diameter, compared to 5.5 cm on the prior study. Minimal surrounding soft tissue inflammation is seen. This is outside of the  visualized calcification about the abdominal aorta, and may reflect chronic worsening intramural dissection, a large irregular  mycotic aneurysm, or underlying diffuse aortitis. 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 This may be related to the surrounding finding, and is mildly more apparent than on the prior study. The liver and spleen are unremarkable in appearance. The patient is status post cholecystectomy, with clips noted at the gallbladder fossa. The pancreas and adrenal glands are unremarkable. Mild bilateral renal scarring is noted. A small right renal cyst is noted. Mild nonspecific left-sided perinephric stranding is noted. There is no evidence of hydronephrosis. No renal or ureteral stones are seen. No perinephric stranding is appreciated. No free fluid is identified. The small bowel is unremarkable in appearance. The stomach is within normal limits. No acute vascular abnormalities are seen. The appendix is not definitely characterized; there is no evidence for appendicitis. The colon is unremarkable in appearance. The bladder is moderately distended and grossly unremarkable. The patient is status post hysterectomy. No suspicious adnexal masses are seen. No inguinal lymphadenopathy is seen. No acute osseous abnormalities are identified. There is chronic loss of height involving vertebral bodies L1 through L4. Review of the MIP images confirms the above findings. IMPRESSION: 1. Irregular heterogeneous soft tissue density surrounding the abdominal aorta from the level of the renal arteries inferiorly just past the aortic bifurcation appears to have increased in size, now measuring 6.0 cm in maximal diameter, compared to 5.5 cm on the prior study. Minimal surrounding soft inflammation seen. This is outside of the visualized calcification about the abdominal aorta, and may  reflect chronic worsening intramural dissection, a large irregular mycotic aneurysm, or underlying diffuse aortitis. No evidence of active contrast extravasation at this time. 2. Small focal outpouching along the anterior abdominal aorta just inferior to the renal arteries, suspicious for a penetrating aortic ulceration. This is mildly more apparent than on the prior study, and may be related to the surrounding finding. 3. No evidence of pulmonary embolus. 4. Minimal bibasilar atelectasis noted. Mild emphysematous change at the lung apices. 5. Diffuse coronary artery calcifications seen. 6. Mild bilateral renal scarring, and small right renal cyst. 7. Chronic loss of height involving multiple thoracic and lumbar vertebral bodies. These results were called by telephone at the time of interpretation on 11/21/2015 at 1:03 am to Dr. Rochele Raring, who verbally acknowledged these results. Electronically Signed   By: Roanna Raider M.D.   On: 11/21/2015 01:14   Ct Angio Abd/pel W/ And/or W/o  10/28/2015  CLINICAL DATA:  Abdominal aortic aneurysm EXAM: CTA CHEST ABDOMEN AND PELVIS wITHOUT AND WITH CONTRAST TECHNIQUE: Multidetector CT imaging of the chest, abdomen and pelvis was performed using the standard protocol during bolus administration of intravenous contrast. Multiplanar reconstructed images and MIPs were obtained and reviewed to evaluate the vascular anatomy. CONTRAST:  75 cc Isovue 370 COMPARISON:  08/25/2015 FINDINGS: Chest: Maximal diameter of the ascending aorta is 3.6 cm. The arch and descending thoracic aorta are non aneurysmal. There is smooth minimal plaque in the proximal descending thoracic aorta. Minimal smooth plaque in the innominate artery. It is patent. Right subclavian and common carotid arteries are patent. Left common carotid artery is patent. Left subclavian artery is patent. Vertebral arteries are patent within the confines of the examination. No evidence of acute pulmonary thromboembolism. No  evidence of abnormal mediastinal adenopathy. Thyroid is unremarkable. There is centrilobular emphysema towards the lung apices. Dependent atelectasis posteriorly. Tiny nodules in the right middle lobe. On image 62,  1 of them is 3 mm. 5 mm left lower lobe pulmonary nodule on image 79. These are stable in comparison to prior recent and remote studies. Stable mid thoracic compression deformities. Abdomen: Infrarenal abdominal aorta with a maximal transverse diameter of 5.5 cm is stable. There continues to be thickening of the wall of the aorta with haziness in the adjacent fat. The wall enhances on delayed imaging. These findings are compatible with inflammation of the wall or vasculitis. These findings are stable. Celiac is patent.  Branch vessels are non aneurysmal and patent. SMA is patent. IMA is severely diminutive with severe narrowing as the lumen passes through the thickened wall of the lower aortic aneurysm. Branch vessels are patent and diminutive. Single renal arteries are patent. Bilateral common, internal, and external iliac arteries are patent and non aneurysmal. Postcholecystectomy Liver, spleen, pancreas, adrenal glands are within normal limits. Simple cysts in the right kidney. Chronic changes of the left kidney Uterus is absent. Adnexa are unremarkable. There is bladder prolapse towards the vagina. No focal mass of the colon. Upper normal gas-filled loops of small bowel are in the left hemi abdomen. Upper lumbar and lower thoracic compression deformities are stable. Lumbosacral junction is transitional. No free-fluid.  No abnormal retroperitoneal adenopathy Review of the MIP images confirms the above findings. IMPRESSION: Within the thorax, there is no evidence of aortic aneurysm. Mild atherosclerotic changes are noted. Small bilateral pulmonary nodules are stable compatible with benign disease. Emphysema is noted Infrarenal abdominal aortic aneurysm is stable at 5.5 cm. There is persistent thickening  and enhancement of the wall with inflammatory change compatible with inflammatory aneurysm or vasculitis. Stable compression deformities in the thoracic and lumbar spine. Electronically Signed   By: Jolaine Click M.D.   On: 10/28/2015 12:05    Microbiology: Recent Results (from the past 240 hour(s))  MRSA PCR Screening     Status: None   Collection Time: 11/21/15 11:11 AM  Result Value Ref Range Status   MRSA by PCR NEGATIVE NEGATIVE Final    Comment:        The GeneXpert MRSA Assay (FDA approved for NASAL specimens only), is one component of a comprehensive MRSA colonization surveillance program. It is not intended to diagnose MRSA infection nor to guide or monitor treatment for MRSA infections. Performed at [facility]      Labs: Basic Metabolic Panel:  Recent Labs Lab 11/20/15 2350 11/21/15 0008 11/21/15 1205  NA 134* 137 138  K 3.6 3.7 3.6  CL 104 102 110  CO2 22  --  18*  GLUCOSE 113* 110* 82  BUN 9 8 6   CREATININE 0.75 0.70 0.71  CALCIUM 8.8*  --  8.5*   Liver Function Tests:  Recent Labs Lab 11/20/15 2350 11/21/15 1205  AST 14* 13*  ALT 10* 9*  ALKPHOS 80 67  BILITOT 0.3 0.5  PROT 8.0 7.1  ALBUMIN 3.2* 2.8*    Recent Labs Lab 11/20/15 2350  LIPASE 23   No results for input(s): AMMONIA in the last 168 hours. CBC:  Recent Labs Lab 11/20/15 2350 11/21/15 0008 11/21/15 1205  WBC 10.1  --  7.2  NEUTROABS 7.0  --  3.8  HGB 10.3* 10.5* 9.8*  HCT 31.3* 31.0* 30.6*  MCV 89.7  --  89.2  PLT 420*  --  374   Cardiac Enzymes:  Recent Labs Lab 11/20/15 2350  TROPONINI <0.03   BNP: BNP (last 3 results) No results for input(s): BNP in the last  8760 hours.  ProBNP (last 3 results) No results for input(s): PROBNP in the last 8760 hours.  CBG:  Recent Labs Lab 11/22/15 0405 11/22/15 0755 11/22/15 1558 11/22/15 2354 11/23/15 0151  GLUCAP 103* 102* 102* 108* 107*       Signed:  Batsheva Stevick MD, PhD  [facility] 11/23/2015, 3:00 PM

## 2015-11-24 ENCOUNTER — Other Ambulatory Visit: Payer: Self-pay

## 2015-11-28 ENCOUNTER — Encounter (HOSPITAL_COMMUNITY): Payer: Self-pay

## 2015-11-28 ENCOUNTER — Encounter (HOSPITAL_COMMUNITY)
Admission: RE | Admit: 2015-11-28 | Discharge: 2015-11-28 | Disposition: A | Discharge status: Home / Self Care | Payer: BLUE CROSS/BLUE SHIELD | Source: Ambulatory Visit | Attending: Surgery | Admitting: Surgery

## 2015-11-28 DIAGNOSIS — I10 Essential (primary) hypertension: Secondary | ICD-10-CM | POA: Diagnosis not present

## 2015-11-28 DIAGNOSIS — Z955 Presence of coronary angioplasty implant and graft: Secondary | ICD-10-CM | POA: Diagnosis not present

## 2015-11-28 DIAGNOSIS — I252 Old myocardial infarction: Secondary | ICD-10-CM | POA: Diagnosis not present

## 2015-11-28 DIAGNOSIS — Z87891 Personal history of nicotine dependence: Secondary | ICD-10-CM | POA: Diagnosis not present

## 2015-11-28 DIAGNOSIS — I2510 Atherosclerotic heart disease of native coronary artery without angina pectoris: Secondary | ICD-10-CM | POA: Insufficient documentation

## 2015-11-28 DIAGNOSIS — E785 Hyperlipidemia, unspecified: Secondary | ICD-10-CM | POA: Insufficient documentation

## 2015-11-28 DIAGNOSIS — J449 Chronic obstructive pulmonary disease, unspecified: Secondary | ICD-10-CM | POA: Insufficient documentation

## 2015-11-28 DIAGNOSIS — I714 Abdominal aortic aneurysm, without rupture: Secondary | ICD-10-CM | POA: Diagnosis not present

## 2015-11-28 DIAGNOSIS — Z01812 Encounter for preprocedural laboratory examination: Secondary | ICD-10-CM | POA: Insufficient documentation

## 2015-11-28 DIAGNOSIS — K219 Gastro-esophageal reflux disease without esophagitis: Secondary | ICD-10-CM | POA: Diagnosis not present

## 2015-11-28 DIAGNOSIS — Z0183 Encounter for blood typing: Secondary | ICD-10-CM | POA: Diagnosis not present

## 2015-11-28 DIAGNOSIS — Z79899 Other long term (current) drug therapy: Secondary | ICD-10-CM | POA: Diagnosis not present

## 2015-11-28 DIAGNOSIS — Z01818 Encounter for other preprocedural examination: Secondary | ICD-10-CM | POA: Diagnosis present

## 2015-11-28 HISTORY — DX: Nausea with vomiting, unspecified: R11.2

## 2015-11-28 HISTORY — DX: Adverse effect of unspecified anesthetic, initial encounter: T41.45XA

## 2015-11-28 HISTORY — DX: Family history of other specified conditions: Z84.89

## 2015-11-28 HISTORY — DX: Other complications of anesthesia, initial encounter: T88.59XA

## 2015-11-28 HISTORY — DX: Depression, unspecified: F32.A

## 2015-11-28 HISTORY — DX: Other specified postprocedural states: Z98.890

## 2015-11-28 HISTORY — DX: Anxiety disorder, unspecified: F41.9

## 2015-11-28 HISTORY — DX: Major depressive disorder, single episode, unspecified: F32.9

## 2015-11-28 LAB — BASIC METABOLIC PANEL
ANION GAP: 11 (ref 5–15)
CHLORIDE: 104 mmol/L (ref 101–111)
CO2: 21 mmol/L — AB (ref 22–32)
Calcium: 9 mg/dL (ref 8.9–10.3)
Creatinine, Ser: 0.63 mg/dL (ref 0.44–1.00)
GFR calc Af Amer: >60 mL/min (ref >60)
GLUCOSE: 125 mg/dL — AB (ref 65–99)
POTASSIUM: 3.8 mmol/L (ref 3.5–5.1)
Sodium: 136 mmol/L (ref 135–145)

## 2015-11-28 LAB — BLOOD GAS, ARTERIAL
ACID-BASE DEFICIT: 0.5 mmol/L (ref 0.0–2.0)
Bicarbonate: 22.5 mEq/L (ref 20.0–24.0)
DRAWN BY: 421801
FIO2: 0.21
O2 Saturation: 97.4 %
PATIENT TEMPERATURE: 98.6
PH ART: 7.485 — AB (ref 7.350–7.450)
TCO2: 23.4 mmol/L (ref 0–100)
pCO2 arterial: 30.2 mmHg — ABNORMAL LOW (ref 35.0–45.0)
pO2, Arterial: 89.8 mmHg (ref 80.0–100.0)

## 2015-11-28 LAB — CBC
HEMATOCRIT: 31.6 % — AB (ref 36.0–46.0)
HEMOGLOBIN: 9.8 g/dL — AB (ref 12.0–15.0)
MCH: 28.1 pg (ref 26.0–34.0)
MCHC: 31 g/dL (ref 30.0–36.0)
MCV: 90.5 fL (ref 78.0–100.0)
PLATELETS: 399 10*3/uL (ref 150–400)
RBC: 3.49 MIL/uL — AB (ref 3.87–5.11)
RDW: 14.4 % (ref 11.5–15.5)
WBC: 7.4 10*3/uL (ref 4.0–10.5)

## 2015-11-28 LAB — ABO/RH: ABO/RH(D): B NEG

## 2015-11-28 LAB — SURGICAL PCR SCREEN
MRSA, PCR: NEGATIVE
Staphylococcus aureus: POSITIVE — AB

## 2015-11-28 NOTE — Progress Notes (Signed)
I called a prescription for Mupirocin ointment to [address], [place], [place]

## 2015-11-28 NOTE — Pre-Procedure Instructions (Signed)
    Olivia Wade  11/28/2015      Your procedure is scheduled on Friday, June 23.  Report to [facility] Admitting at 5:30  A.M.                Your surgery or procedure is scheduled for 7:30 AM   Call this number if you have problems the morning of surgery:[phone]                 For any other questions, please call [phone], Monday - Friday 8 AM - 4 PM.   Remember:  Do not eat food or drink liquids after midnight Thursday, June 22.  Take these medicines the morning of surgery with A SIP OF WATER :Aspirin,  citalopram (CELEXA),  isosorbide mononitrate (IMDUR), metoprolol succinate (TOPROL-XL), pantoprazole (PROTONIX). Use Inhalers. Please bring Albuterol Inhaler to the hospital with you.                Take if needed: Nitroglycerin, cyclobenzaprine (FLEXERIL),   ALPRAZolam Prudy Feeler).                 Aspirin and Brilenta as instructed by Dr Myra Gianotti.   Do not wear jewelry, make-up or nail polish.  Do not wear lotions, powders, or perfumes.    Men may shave face and neck.  Do not bring valuables to the hospital.  [facility] is not responsible for any belongings or valuables.  Contacts, dentures or bridgework may not be worn into surgery.  Leave your suitcase in the car.  After surgery it may be brought to your room.  For patients admitted to the hospital, discharge time will be determined by your treatment team  Please read over the following fact sheets that you were given: [facility]- Preparing For Surgery and Patient Instructions for Mupirocin Application

## 2015-11-29 NOTE — Progress Notes (Signed)
Anesthesia Chart Review:  Pt is a 65 year old female scheduled for abdominal aortic endovascular fenestrated stent graft on 12/02/2015 with Dr. Myra Gianotti.   Cardiologist is Dr. Purvis Sheffield who has cleared pt for surgery.   PMH includes:  CAD (DES to RCA and LAD 2015), NSTEMI (2015), AAA, HTN, palpitations, hyperlipidemia, COPD, asthma, post-op N/V, GERD. Former smoker. BMI 26  Pt hospitalized 6/11- 11/23/15 for AAA.  Medications include: albuterol, lipitor, iron, imdur, lisinopril, metoprolol, protonix. Pt was taking brilinta but it was stopped around 11/23/15 when pt was hospitalized.   Preoperative labs reviewed.  H/H 9.8/31.6. T&S already done. Left voicemail for [facility] in Dr. Estanislado Spire office with H/H results.   CTA chest/abd/pelvis 11/21/15:  1. Irregular heterogeneous soft tissue density surrounding the abdominal aorta from the level of the renal arteries inferiorly just past the aortic bifurcation appears to have increased in size, now measuring 6.0 cm in maximal diameter, compared to 5.5 cm on the prior study. Minimal surrounding soft inflammation seen. This is outside of the visualized calcification about the abdominal aorta, and may reflect chronic worsening intramural dissection, a large irregular mycotic aneurysm, or underlying diffuse aortitis. No evidence of active contrast extravasation at this time. 2. Small focal outpouching along the anterior abdominal aorta just inferior to the renal arteries, suspicious for a penetrating aortic ulceration. This is mildly more apparent than on the prior study, and may be related to the surrounding finding. 3. No evidence of pulmonary embolus. 4. Minimal bibasilar atelectasis noted. Mild emphysematous change at the lung apices. 5. Diffuse coronary artery calcifications seen. 6. Mild bilateral renal scarring, and small right renal cyst. 7. Chronic loss of height involving multiple thoracic and lumbar vertebral bodies.  EKG 11/21/15: Sinus rhythm.  Abnormal R-wave progression, early transition  Carotid duplex 09/08/15: Essentially normal study with minimal plaque B carotid arteries.   Nuclear stress test 08/16/15:   There was no ST segment deviation noted during stress.  The study is normal. There are no perfusion defects consistent with prior infarction or current ischemia  This is a low risk study.  The left ventricular ejection fraction is normal (55-65%).  Echo 08/16/15:  - Left ventricle: The cavity size was normal. Wall thickness was increased in a pattern of mild LVH. Systolic function was normal. The estimated ejection fraction was in the range of 60% to 65%. Wall motion was normal; there were no regional wall motion abnormalities. Doppler parameters are consistent with abnormal left ventricular relaxation (grade 1 diastolic dysfunction). - Aortic valve: Mildly calcified annulus. Trileaflet; normal thickness leaflets. Valve area (VTI): 2.51 cm^2. Valve area (Vmax): 2.73 cm^2. - Systemic veins: The IVC is small, suggesting low RA pressure and hypovolemia. - Technically adequate study.  Cardiac cath 09/10/13:  - Widely patent LAD stent extending from the ostium to the proximal LAD with mild 20% narrowing in the region the first diagonal takeoff followed by 40% mid LAD stenosis - Normal left circumflex coronary artery - High-grade 90% "napkin ring "like fibrotic stenosis in the proximal RCA followed by 50% fibrotic stenosis. - Successful PCI to the proximal RCA with insertion of a 3.0x15 mm Xience Alpine DES stent postdilated to 3.26 mm with the stenoses being reduced to 0%.  If no changes, I anticipate pt can proceed with surgery as scheduled.   Rica Mast, FNP-BC [facility]/Anesthesiology Phone: [phone] 11/29/2015 2:15 PM

## 2015-12-01 MED ORDER — VANCOMYCIN HCL IN DEXTROSE 1-5 GM/200ML-% IV SOLN
1000.0000 mg | INTRAVENOUS | Status: AC
  Administered 2015-12-02: 1000 mg via INTRAVENOUS
  Filled 2015-12-01: qty 200

## 2015-12-02 ENCOUNTER — Inpatient Hospital Stay (HOSPITAL_COMMUNITY)
Admission: RE | Admit: 2015-12-02 | Discharge: 2015-12-03 | DRG: 269 | Disposition: A | Discharge status: Home / Self Care | Payer: BLUE CROSS/BLUE SHIELD | Source: Ambulatory Visit | Attending: Surgery | Admitting: Surgery

## 2015-12-02 ENCOUNTER — Other Ambulatory Visit: Payer: Self-pay | Admitting: *Deleted

## 2015-12-02 ENCOUNTER — Encounter (HOSPITAL_COMMUNITY): Payer: Self-pay | Admitting: *Deleted

## 2015-12-02 ENCOUNTER — Inpatient Hospital Stay (HOSPITAL_COMMUNITY): Payer: BLUE CROSS/BLUE SHIELD | Admitting: Certified Registered Nurse Anesthetist

## 2015-12-02 ENCOUNTER — Encounter (HOSPITAL_COMMUNITY)
Admission: RE | Disposition: A | Discharge status: Home / Self Care | Payer: Self-pay | Source: Ambulatory Visit | Attending: Surgery

## 2015-12-02 ENCOUNTER — Inpatient Hospital Stay (HOSPITAL_COMMUNITY): Payer: BLUE CROSS/BLUE SHIELD | Admitting: Emergency Medicine

## 2015-12-02 ENCOUNTER — Inpatient Hospital Stay (HOSPITAL_COMMUNITY): Payer: BLUE CROSS/BLUE SHIELD

## 2015-12-02 DIAGNOSIS — Z7982 Long term (current) use of aspirin: Secondary | ICD-10-CM | POA: Diagnosis not present

## 2015-12-02 DIAGNOSIS — G8929 Other chronic pain: Secondary | ICD-10-CM | POA: Diagnosis present

## 2015-12-02 DIAGNOSIS — I2510 Atherosclerotic heart disease of native coronary artery without angina pectoris: Secondary | ICD-10-CM | POA: Diagnosis present

## 2015-12-02 DIAGNOSIS — Z7902 Long term (current) use of antithrombotics/antiplatelets: Secondary | ICD-10-CM | POA: Diagnosis not present

## 2015-12-02 DIAGNOSIS — Z7951 Long term (current) use of inhaled steroids: Secondary | ICD-10-CM

## 2015-12-02 DIAGNOSIS — M1990 Unspecified osteoarthritis, unspecified site: Secondary | ICD-10-CM | POA: Diagnosis present

## 2015-12-02 DIAGNOSIS — E7800 Pure hypercholesterolemia, unspecified: Secondary | ICD-10-CM | POA: Diagnosis present

## 2015-12-02 DIAGNOSIS — I714 Abdominal aortic aneurysm, without rupture, unspecified: Secondary | ICD-10-CM

## 2015-12-02 DIAGNOSIS — Z885 Allergy status to narcotic agent status: Secondary | ICD-10-CM

## 2015-12-02 DIAGNOSIS — Z91030 Bee allergy status: Secondary | ICD-10-CM | POA: Diagnosis not present

## 2015-12-02 DIAGNOSIS — Z8249 Family history of ischemic heart disease and other diseases of the circulatory system: Secondary | ICD-10-CM

## 2015-12-02 DIAGNOSIS — I252 Old myocardial infarction: Secondary | ICD-10-CM

## 2015-12-02 DIAGNOSIS — M545 Low back pain: Secondary | ICD-10-CM | POA: Diagnosis present

## 2015-12-02 DIAGNOSIS — I10 Essential (primary) hypertension: Secondary | ICD-10-CM | POA: Diagnosis present

## 2015-12-02 DIAGNOSIS — E785 Hyperlipidemia, unspecified: Secondary | ICD-10-CM | POA: Diagnosis present

## 2015-12-02 DIAGNOSIS — Z87891 Personal history of nicotine dependence: Secondary | ICD-10-CM | POA: Diagnosis not present

## 2015-12-02 DIAGNOSIS — Z880 Allergy status to penicillin: Secondary | ICD-10-CM | POA: Diagnosis not present

## 2015-12-02 DIAGNOSIS — Z95828 Presence of other vascular implants and grafts: Secondary | ICD-10-CM

## 2015-12-02 DIAGNOSIS — K219 Gastro-esophageal reflux disease without esophagitis: Secondary | ICD-10-CM | POA: Diagnosis present

## 2015-12-02 DIAGNOSIS — Z955 Presence of coronary angioplasty implant and graft: Secondary | ICD-10-CM

## 2015-12-02 DIAGNOSIS — E876 Hypokalemia: Secondary | ICD-10-CM | POA: Diagnosis present

## 2015-12-02 DIAGNOSIS — R200 Anesthesia of skin: Secondary | ICD-10-CM | POA: Diagnosis present

## 2015-12-02 DIAGNOSIS — Z79899 Other long term (current) drug therapy: Secondary | ICD-10-CM

## 2015-12-02 DIAGNOSIS — D62 Acute posthemorrhagic anemia: Secondary | ICD-10-CM | POA: Diagnosis not present

## 2015-12-02 DIAGNOSIS — J449 Chronic obstructive pulmonary disease, unspecified: Secondary | ICD-10-CM | POA: Diagnosis present

## 2015-12-02 DIAGNOSIS — Z8679 Personal history of other diseases of the circulatory system: Secondary | ICD-10-CM

## 2015-12-02 HISTORY — PX: ABDOMINAL AORTIC ENDOVASCULAR FENESTRATED STENT GRAFT: SHX6430

## 2015-12-02 HISTORY — PX: ABDOMINAL AORTIC ANEURYSM REPAIR: SHX42

## 2015-12-02 LAB — CBC
HEMATOCRIT: 27.1 % — AB (ref 36.0–46.0)
HEMOGLOBIN: 8.3 g/dL — AB (ref 12.0–15.0)
MCH: 27.5 pg (ref 26.0–34.0)
MCHC: 30.6 g/dL (ref 30.0–36.0)
MCV: 89.7 fL (ref 78.0–100.0)
Platelets: 291 10*3/uL (ref 150–400)
RBC: 3.02 MIL/uL — AB (ref 3.87–5.11)
RDW: 14.9 % (ref 11.5–15.5)
WBC: 11.4 10*3/uL — ABNORMAL HIGH (ref 4.0–10.5)

## 2015-12-02 LAB — BASIC METABOLIC PANEL
Anion gap: 7 (ref 5–15)
BUN: 9 mg/dL (ref 6–20)
CHLORIDE: 108 mmol/L (ref 101–111)
CO2: 22 mmol/L (ref 22–32)
CREATININE: 0.84 mg/dL (ref 0.44–1.00)
Calcium: 8 mg/dL — ABNORMAL LOW (ref 8.9–10.3)
GFR calc non Af Amer: >60 mL/min (ref >60)
Glucose, Bld: 116 mg/dL — ABNORMAL HIGH (ref 65–99)
POTASSIUM: 4.3 mmol/L (ref 3.5–5.1)
SODIUM: 137 mmol/L (ref 135–145)

## 2015-12-02 LAB — PREPARE RBC (CROSSMATCH)

## 2015-12-02 LAB — PROTIME-INR
INR: 1.28 (ref 0.00–1.49)
Prothrombin Time: 16.1 seconds — ABNORMAL HIGH (ref 11.6–15.2)

## 2015-12-02 LAB — APTT: APTT: 36 s (ref 24–37)

## 2015-12-02 LAB — MAGNESIUM: Magnesium: 1.9 mg/dL (ref 1.7–2.4)

## 2015-12-02 SURGERY — ABDOMINAL AORTIC ENDOVASCULAR FENESTRATED STENT GRAFT
Anesthesia: General | Site: Abdomen

## 2015-12-02 MED ORDER — PROPOFOL 10 MG/ML IV BOLUS
INTRAVENOUS | Status: DC | PRN
  Administered 2015-12-02: 150 mg via INTRAVENOUS

## 2015-12-02 MED ORDER — POTASSIUM CHLORIDE CRYS ER 20 MEQ PO TBCR
20.0000 meq | EXTENDED_RELEASE_TABLET | Freq: Every day | ORAL | Status: AC | PRN
  Administered 2015-12-03: 40 meq via ORAL
  Filled 2015-12-02: qty 2

## 2015-12-02 MED ORDER — ONDANSETRON HCL 4 MG/2ML IJ SOLN
4.0000 mg | Freq: Four times a day (QID) | INTRAMUSCULAR | Status: DC | PRN
  Administered 2015-12-03: 4 mg via INTRAVENOUS
  Filled 2015-12-02: qty 2

## 2015-12-02 MED ORDER — ALUM & MAG HYDROXIDE-SIMETH 200-200-20 MG/5ML PO SUSP: 15.0000 mL | ORAL | Status: DC | PRN

## 2015-12-02 MED ORDER — ATORVASTATIN CALCIUM 40 MG PO TABS
40.0000 mg | ORAL_TABLET | Freq: Every day | ORAL | Status: DC
  Administered 2015-12-02: 40 mg via ORAL
  Filled 2015-12-02: qty 1

## 2015-12-02 MED ORDER — FENTANYL CITRATE (PF) 100 MCG/2ML IJ SOLN
INTRAMUSCULAR | Status: DC | PRN
  Administered 2015-12-02: 50 ug via INTRAVENOUS
  Administered 2015-12-02: 100 ug via INTRAVENOUS
  Administered 2015-12-02 (×2): 50 ug via INTRAVENOUS

## 2015-12-02 MED ORDER — OXYCODONE-ACETAMINOPHEN 5-325 MG PO TABS: 1.0000 | ORAL_TABLET | Freq: Four times a day (QID) | ORAL | Status: DC | PRN

## 2015-12-02 MED ORDER — OXYCODONE-ACETAMINOPHEN 5-325 MG PO TABS
1.0000 | ORAL_TABLET | ORAL | Status: DC | PRN
  Administered 2015-12-03: 1 via ORAL
  Administered 2015-12-03: 2 via ORAL
  Filled 2015-12-02: qty 1
  Filled 2015-12-02: qty 2

## 2015-12-02 MED ORDER — SUGAMMADEX SODIUM 200 MG/2ML IV SOLN
INTRAVENOUS | Status: DC | PRN
  Administered 2015-12-02: 138.8 mg via INTRAVENOUS

## 2015-12-02 MED ORDER — SODIUM CHLORIDE 0.9 % IV SOLN
INTRAVENOUS | Status: DC | PRN
  Administered 2015-12-02: 500 mL

## 2015-12-02 MED ORDER — HYDRALAZINE HCL 20 MG/ML IJ SOLN: 5.0000 mg | INTRAMUSCULAR | Status: DC | PRN

## 2015-12-02 MED ORDER — CITALOPRAM HYDROBROMIDE 20 MG PO TABS
20.0000 mg | ORAL_TABLET | Freq: Every day | ORAL | Status: DC
  Administered 2015-12-02 – 2015-12-03 (×2): 20 mg via ORAL
  Filled 2015-12-02 (×2): qty 1

## 2015-12-02 MED ORDER — IODIXANOL 320 MG/ML IV SOLN
INTRAVENOUS | Status: DC | PRN
  Administered 2015-12-02: 113 mL via INTRAVENOUS

## 2015-12-02 MED ORDER — SODIUM CHLORIDE 0.9 % IV SOLN: 500.0000 mL | Freq: Once | INTRAVENOUS | Status: DC | PRN

## 2015-12-02 MED ORDER — LACTATED RINGERS IV SOLN
INTRAVENOUS | Status: DC | PRN
  Administered 2015-12-02 (×2): via INTRAVENOUS

## 2015-12-02 MED ORDER — ENOXAPARIN SODIUM 30 MG/0.3ML ~~LOC~~ SOLN: 30.0000 mg | SUBCUTANEOUS | Status: DC

## 2015-12-02 MED ORDER — SODIUM CHLORIDE 0.9 % IV SOLN: Freq: Once | INTRAVENOUS | Status: DC

## 2015-12-02 MED ORDER — POLYSACCHARIDE IRON COMPLEX 150 MG PO CAPS
150.0000 mg | ORAL_CAPSULE | Freq: Every day | ORAL | Status: DC
  Administered 2015-12-03: 150 mg via ORAL
  Filled 2015-12-02: qty 1

## 2015-12-02 MED ORDER — ALBUTEROL SULFATE (2.5 MG/3ML) 0.083% IN NEBU: 3.0000 mL | INHALATION_SOLUTION | Freq: Four times a day (QID) | RESPIRATORY_TRACT | Status: DC | PRN

## 2015-12-02 MED ORDER — ONDANSETRON HCL 4 MG/2ML IJ SOLN
INTRAMUSCULAR | Status: AC
  Administered 2015-12-02: 4 mg
  Filled 2015-12-02: qty 2

## 2015-12-02 MED ORDER — SODIUM CHLORIDE 0.9 % IV SOLN
INTRAVENOUS | Status: DC
  Administered 2015-12-02: 16:00:00 via INTRAVENOUS

## 2015-12-02 MED ORDER — LACTATED RINGERS IV SOLN
INTRAVENOUS | Status: DC | PRN
  Administered 2015-12-02: 07:00:00 via INTRAVENOUS

## 2015-12-02 MED ORDER — LIDOCAINE HCL (CARDIAC) 20 MG/ML IV SOLN
INTRAVENOUS | Status: DC | PRN
  Administered 2015-12-02: 70 mg via INTRAVENOUS

## 2015-12-02 MED ORDER — ACETAMINOPHEN 325 MG RE SUPP: 325.0000 mg | RECTAL | Status: DC | PRN

## 2015-12-02 MED ORDER — FENTANYL CITRATE (PF) 100 MCG/2ML IJ SOLN
INTRAMUSCULAR | Status: AC
  Administered 2015-12-02: 25 ug via INTRAVENOUS
  Filled 2015-12-02: qty 2

## 2015-12-02 MED ORDER — MAGNESIUM SULFATE 2 GM/50ML IV SOLN: 2.0000 g | Freq: Every day | INTRAVENOUS | Status: DC | PRN

## 2015-12-02 MED ORDER — PHENYLEPHRINE HCL 10 MG/ML IJ SOLN
INTRAMUSCULAR | Status: DC | PRN
  Administered 2015-12-02: 40 ug via INTRAVENOUS

## 2015-12-02 MED ORDER — NITROGLYCERIN 0.4 MG SL SUBL: 0.4000 mg | SUBLINGUAL_TABLET | SUBLINGUAL | Status: DC | PRN

## 2015-12-02 MED ORDER — MUPIROCIN 2 % EX OINT
TOPICAL_OINTMENT | CUTANEOUS | Status: AC
  Filled 2015-12-02: qty 22

## 2015-12-02 MED ORDER — PHENOL 1.4 % MT LIQD
1.0000 | OROMUCOSAL | Status: DC | PRN
Start: 2015-12-02 — End: 2015-12-03

## 2015-12-02 MED ORDER — MONTELUKAST SODIUM 10 MG PO TABS
10.0000 mg | ORAL_TABLET | Freq: Every day | ORAL | Status: DC
  Administered 2015-12-02: 10 mg via ORAL
  Filled 2015-12-02 (×2): qty 1

## 2015-12-02 MED ORDER — HEPARIN SODIUM (PORCINE) 1000 UNIT/ML IJ SOLN
INTRAMUSCULAR | Status: DC | PRN
  Administered 2015-12-02 (×2): 3000 [IU] via INTRAVENOUS
  Administered 2015-12-02 (×2): 2000 [IU] via INTRAVENOUS
  Administered 2015-12-02: 7000 [IU] via INTRAVENOUS

## 2015-12-02 MED ORDER — ACETAMINOPHEN 325 MG PO TABS: 325.0000 mg | ORAL_TABLET | ORAL | Status: DC | PRN

## 2015-12-02 MED ORDER — FENTANYL CITRATE (PF) 100 MCG/2ML IJ SOLN
25.0000 ug | INTRAMUSCULAR | Status: DC | PRN
  Administered 2015-12-02 (×5): 25 ug via INTRAVENOUS

## 2015-12-02 MED ORDER — FENTANYL CITRATE (PF) 250 MCG/5ML IJ SOLN
INTRAMUSCULAR | Status: AC
  Filled 2015-12-02: qty 5

## 2015-12-02 MED ORDER — GUAIFENESIN-DM 100-10 MG/5ML PO SYRP: 15.0000 mL | ORAL_SOLUTION | ORAL | Status: DC | PRN

## 2015-12-02 MED ORDER — FENTANYL CITRATE (PF) 100 MCG/2ML IJ SOLN
25.0000 ug | INTRAMUSCULAR | Status: DC | PRN
  Administered 2015-12-02: 25 ug via INTRAVENOUS

## 2015-12-02 MED ORDER — CHLORHEXIDINE GLUCONATE CLOTH 2 % EX PADS: 6.0000 | MEDICATED_PAD | Freq: Once | CUTANEOUS | Status: AC

## 2015-12-02 MED ORDER — ONDANSETRON HCL 4 MG/2ML IJ SOLN: 4.0000 mg | Freq: Once | INTRAMUSCULAR | Status: DC | PRN

## 2015-12-02 MED ORDER — ROCURONIUM BROMIDE 100 MG/10ML IV SOLN
INTRAVENOUS | Status: DC | PRN
  Administered 2015-12-02: 10 mg via INTRAVENOUS
  Administered 2015-12-02: 5 mg via INTRAVENOUS
  Administered 2015-12-02: 20 mg via INTRAVENOUS
  Administered 2015-12-02: 40 mg via INTRAVENOUS
  Administered 2015-12-02 (×2): 10 mg via INTRAVENOUS
  Administered 2015-12-02: 20 mg via INTRAVENOUS

## 2015-12-02 MED ORDER — METOPROLOL SUCCINATE ER 50 MG PO TB24
50.0000 mg | ORAL_TABLET | Freq: Two times a day (BID) | ORAL | Status: DC
  Administered 2015-12-02 – 2015-12-03 (×2): 50 mg via ORAL
  Filled 2015-12-02 (×2): qty 1

## 2015-12-02 MED ORDER — ISOSORBIDE MONONITRATE ER 30 MG PO TB24
30.0000 mg | ORAL_TABLET | Freq: Every day | ORAL | Status: DC
  Administered 2015-12-03: 30 mg via ORAL
  Filled 2015-12-02: qty 1

## 2015-12-02 MED ORDER — PROPOFOL 10 MG/ML IV BOLUS
INTRAVENOUS | Status: AC
  Filled 2015-12-02: qty 40

## 2015-12-02 MED ORDER — DOCUSATE SODIUM 100 MG PO CAPS
100.0000 mg | ORAL_CAPSULE | Freq: Every day | ORAL | Status: DC
  Administered 2015-12-03: 100 mg via ORAL
  Filled 2015-12-02: qty 1

## 2015-12-02 MED ORDER — DEXTROSE 5 % IV SOLN
10.0000 mg | INTRAVENOUS | Status: DC | PRN
  Administered 2015-12-02: 15 ug/min via INTRAVENOUS

## 2015-12-02 MED ORDER — CYCLOBENZAPRINE HCL 10 MG PO TABS: 10.0000 mg | ORAL_TABLET | Freq: Three times a day (TID) | ORAL | Status: DC | PRN

## 2015-12-02 MED ORDER — ONDANSETRON HCL 4 MG/2ML IJ SOLN
INTRAMUSCULAR | Status: DC | PRN
  Administered 2015-12-02: 4 mg via INTRAVENOUS

## 2015-12-02 MED ORDER — ALBUTEROL SULFATE (2.5 MG/3ML) 0.083% IN NEBU: 2.5000 mg | INHALATION_SOLUTION | Freq: Four times a day (QID) | RESPIRATORY_TRACT | Status: DC | PRN

## 2015-12-02 MED ORDER — VANCOMYCIN HCL IN DEXTROSE 750-5 MG/150ML-% IV SOLN
750.0000 mg | Freq: Two times a day (BID) | INTRAVENOUS | Status: AC
  Administered 2015-12-02 – 2015-12-03 (×2): 750 mg via INTRAVENOUS
  Filled 2015-12-02 (×2): qty 150

## 2015-12-02 MED ORDER — BISACODYL 10 MG RE SUPP
10.0000 mg | Freq: Every day | RECTAL | Status: DC | PRN
Start: 2015-12-02 — End: 2015-12-03

## 2015-12-02 MED ORDER — MUPIROCIN 2 % EX OINT
1.0000 "application " | TOPICAL_OINTMENT | Freq: Once | CUTANEOUS | Status: AC
  Administered 2015-12-02: 1 via TOPICAL

## 2015-12-02 MED ORDER — MIDAZOLAM HCL 2 MG/2ML IJ SOLN
INTRAMUSCULAR | Status: AC
  Filled 2015-12-02: qty 2

## 2015-12-02 MED ORDER — FOLIC ACID 1 MG PO TABS
1.0000 mg | ORAL_TABLET | Freq: Every day | ORAL | Status: DC
  Administered 2015-12-03: 1 mg via ORAL
  Filled 2015-12-02: qty 1

## 2015-12-02 MED ORDER — ALPRAZOLAM 0.5 MG PO TABS
0.5000 mg | ORAL_TABLET | Freq: Three times a day (TID) | ORAL | Status: DC | PRN
  Administered 2015-12-02: 0.5 mg via ORAL
  Filled 2015-12-02: qty 1

## 2015-12-02 MED ORDER — HYDROMORPHONE HCL 1 MG/ML IJ SOLN
0.5000 mg | INTRAMUSCULAR | Status: DC | PRN
  Administered 2015-12-02 – 2015-12-03 (×3): 0.5 mg via INTRAVENOUS
  Filled 2015-12-02 (×3): qty 1

## 2015-12-02 MED ORDER — PROTAMINE SULFATE 10 MG/ML IV SOLN
INTRAVENOUS | Status: DC | PRN
  Administered 2015-12-02: 50 mg via INTRAVENOUS

## 2015-12-02 MED ORDER — LABETALOL HCL 5 MG/ML IV SOLN: 10.0000 mg | INTRAVENOUS | Status: DC | PRN

## 2015-12-02 MED ORDER — MIDAZOLAM HCL 5 MG/5ML IJ SOLN
INTRAMUSCULAR | Status: DC | PRN
  Administered 2015-12-02 (×2): 1 mg via INTRAVENOUS

## 2015-12-02 MED ORDER — PANTOPRAZOLE SODIUM 40 MG PO TBEC
40.0000 mg | DELAYED_RELEASE_TABLET | Freq: Every morning | ORAL | Status: DC
  Administered 2015-12-03: 40 mg via ORAL
  Filled 2015-12-02: qty 1

## 2015-12-02 MED ORDER — ASPIRIN 81 MG PO CHEW
81.0000 mg | CHEWABLE_TABLET | Freq: Every day | ORAL | Status: DC
  Administered 2015-12-03: 81 mg via ORAL
  Filled 2015-12-02: qty 1

## 2015-12-02 MED ORDER — SENNOSIDES-DOCUSATE SODIUM 8.6-50 MG PO TABS
1.0000 | ORAL_TABLET | Freq: Every evening | ORAL | Status: DC | PRN
  Administered 2015-12-02: 1 via ORAL
  Filled 2015-12-02: qty 1

## 2015-12-02 MED ORDER — 0.9 % SODIUM CHLORIDE (POUR BTL) OPTIME
TOPICAL | Status: DC | PRN
  Administered 2015-12-02: 1000 mL

## 2015-12-02 MED ORDER — VANCOMYCIN HCL IN DEXTROSE 1-5 GM/200ML-% IV SOLN
1000.0000 mg | Freq: Two times a day (BID) | INTRAVENOUS | Status: DC
  Filled 2015-12-02 (×2): qty 200

## 2015-12-02 MED ORDER — METOPROLOL TARTRATE 5 MG/5ML IV SOLN: 2.0000 mg | INTRAVENOUS | Status: DC | PRN

## 2015-12-02 MED ORDER — SODIUM CHLORIDE 0.9 % IV SOLN
INTRAVENOUS | Status: AC
  Administered 2015-12-02: 08:00:00 via INTRAVENOUS

## 2015-12-02 SURGICAL SUPPLY — 92 items
BAG DECANTER FOR FLEXI CONT (MISCELLANEOUS) IMPLANT
BAG SNAP BAND KOVER 36X36 (MISCELLANEOUS) ×2 IMPLANT
BALLN CODA OCL 2-9.0-35-120-3 (BALLOONS) ×3
BALLOON COD OCL 2-9.0-35-120-3 (BALLOONS) IMPLANT
CANISTER SUCTION 2500CC (MISCELLANEOUS) ×3 IMPLANT
CATH ANGIO 5F BER 65CM (CATHETERS) ×2 IMPLANT
CATH ANGIO 5F BER2 65CM (CATHETERS) ×2 IMPLANT
CATH BALLN ADVANCE 35LP (CATHETERS) ×2 IMPLANT
CATH OMNI FLUSH .035X70CM (CATHETERS) ×2 IMPLANT
CATH QUICKCROSS SUPP .035X90CM (MICROCATHETER) ×2 IMPLANT
CATH SOFT-VU 4F 65 STRAIGHT (CATHETERS) IMPLANT
CATH SOFT-VU STRAIGHT 4F 65CM (CATHETERS) ×3
CATH VANSCHIE 4 5FR RDC 65CM (CATHETERS) IMPLANT
COVER DOME SNAP 22 D (MISCELLANEOUS) ×2 IMPLANT
COVER PROBE W GEL 5X96 (DRAPES) ×3 IMPLANT
DEVICE CLOSURE PERCLS PRGLD 6F (VASCULAR PRODUCTS) IMPLANT
DEVICE TORQUE H2O (MISCELLANEOUS) ×2 IMPLANT
DRAIN CHANNEL 10F 3/8 F FF (DRAIN) IMPLANT
DRAIN CHANNEL 10M FLAT 3/4 FLT (DRAIN) IMPLANT
DRAPE INCISE 23X17 IOBAN STRL (DRAPES) ×4
DRAPE INCISE 23X17 STRL (DRAPES) IMPLANT
DRAPE INCISE IOBAN 23X17 STRL (DRAPES) ×2 IMPLANT
DRAPE ZERO GRAVITY STERILE (DRAPES) ×5 IMPLANT
DRSG TEGADERM 2-3/8X2-3/4 SM (GAUZE/BANDAGES/DRESSINGS) ×5 IMPLANT
DRSG TEGADERM 4X4.75 (GAUZE/BANDAGES/DRESSINGS) ×6 IMPLANT
DRYSEAL FLEXSHEATH 18FR 33CM (SHEATH) ×4
ELECT CAUTERY BLADE 6.4 (BLADE) ×2 IMPLANT
ELECT REM PT RETURN 9FT ADLT (ELECTROSURGICAL) ×6
ELECTRODE REM PT RTRN 9FT ADLT (ELECTROSURGICAL) ×2 IMPLANT
EVACUATOR 3/16  PVC DRAIN (DRAIN)
EVACUATOR 3/16 PVC DRAIN (DRAIN) IMPLANT
EVACUATOR SILICONE 100CC (DRAIN) IMPLANT
GAUZE SPONGE 2X2 8PLY STRL LF (GAUZE/BANDAGES/DRESSINGS) ×2 IMPLANT
GLOVE BIO SURGEON STRL SZ 6.5 (GLOVE) ×2 IMPLANT
GLOVE BIO SURGEONS STRL SZ 6.5 (GLOVE) ×2
GLOVE BIOGEL PI IND STRL 7.5 (GLOVE) ×1 IMPLANT
GLOVE BIOGEL PI INDICATOR 7.5 (GLOVE) ×2
GLOVE ECLIPSE 6.5 STRL STRAW (GLOVE) ×2 IMPLANT
GLOVE SURG SS PI 7.5 STRL IVOR (GLOVE) ×3 IMPLANT
GOWN STRL REUS W/ TWL LRG LVL3 (GOWN DISPOSABLE) ×2 IMPLANT
GOWN STRL REUS W/ TWL XL LVL3 (GOWN DISPOSABLE) ×1 IMPLANT
GOWN STRL REUS W/TWL LRG LVL3 (GOWN DISPOSABLE) ×6
GOWN STRL REUS W/TWL XL LVL3 (GOWN DISPOSABLE) ×3
GRAFT BALLN CATH 65CM (STENTS) IMPLANT
GRAFT FEN DIST EVAR 12X28X76 (Graft) ×2 IMPLANT
GRAFT FEN PROX EVAR 24X94 (Graft) IMPLANT
GRAFT FEN PROX EVAR 24X94M (Graft) ×3 IMPLANT
GRAFT ILIAC LEG ZEN SPIR-Z AAA (Endovascular Graft) ×2 IMPLANT
GRAFT LEG ILIAC ZSLE-11-56-ZT (Endovascular Graft) ×2 IMPLANT
GUIDEWIRE AMPLATZ SS .035X260 (WIRE) ×2 IMPLANT
GUIDEWIRE ANGLED .035X150CM (WIRE) ×2 IMPLANT
GUIDEWIRE ANGLED .035X260CM (WIRE) ×2 IMPLANT
HEMOSTAT SNOW SURGICEL 2X4 (HEMOSTASIS) IMPLANT
KIT BASIN OR (CUSTOM PROCEDURE TRAY) ×3 IMPLANT
KIT ENCORE 26 ADVANTAGE (KITS) ×2 IMPLANT
KIT ROOM TURNOVER OR (KITS) ×3 IMPLANT
LIQUID BAND (GAUZE/BANDAGES/DRESSINGS) ×5 IMPLANT
NDL PERC 18GX7CM (NEEDLE) ×1 IMPLANT
NEEDLE PERC 18GX7CM (NEEDLE) ×3 IMPLANT
NS IRRIG 1000ML POUR BTL (IV SOLUTION) ×3 IMPLANT
PACK ENDOVASCULAR (PACKS) ×3 IMPLANT
PAD ARMBOARD 7.5X6 YLW CONV (MISCELLANEOUS) ×6 IMPLANT
PENCIL BUTTON HOLSTER BLD 10FT (ELECTRODE) ×2 IMPLANT
PERCLOSE PROGLIDE 6F (VASCULAR PRODUCTS) ×12
SHEATH AVANTI 11CM 8FR (MISCELLANEOUS) ×2 IMPLANT
SHEATH BRITE TIP 8FR 35CM (SHEATH) ×2 IMPLANT
SHEATH DRYSEAL FLEX 18FR 33CM (SHEATH) IMPLANT
SHEATH HIGHFLEX ANSEL 7FR 55CM (SHEATH) ×4 IMPLANT
SHIELD RADPAD SCOOP 12X17 (MISCELLANEOUS) ×10 IMPLANT
SPONGE GAUZE 2X2 STER 10/PKG (GAUZE/BANDAGES/DRESSINGS) ×4
STENT GRAFT BALLN CATH 65CM (STENTS) ×2
STENT VIABAHN 6X19X135 VBX (Permanent Stent) ×4 IMPLANT
STOPCOCK MORSE 400PSI 3WAY (MISCELLANEOUS) ×3 IMPLANT
SUT ETHILON 3 0 PS 1 (SUTURE) IMPLANT
SUT PROLENE 5 0 C 1 24 (SUTURE) IMPLANT
SUT SILK 2 0 FS (SUTURE) ×2 IMPLANT
SUT VIC AB 2-0 CT1 27 (SUTURE)
SUT VIC AB 2-0 CT1 TAPERPNT 27 (SUTURE) IMPLANT
SUT VIC AB 3-0 SH 27 (SUTURE)
SUT VIC AB 3-0 SH 27X BRD (SUTURE) IMPLANT
SUT VICRYL 4-0 PS2 18IN ABS (SUTURE) ×6 IMPLANT
SYR 30ML LL (SYRINGE) ×2 IMPLANT
TAPE CLOTH SURG 4X10 WHT LF (GAUZE/BANDAGES/DRESSINGS) ×4 IMPLANT
TRAY FOLEY W/METER SILVER 16FR (SET/KITS/TRAYS/PACK) ×3 IMPLANT
TUBING HIGH PRESSURE 120CM (CONNECTOR) ×3 IMPLANT
VANSCHIE 4 5FR RDC 65CM (CATHETERS) ×3
WIRE AMPLATZ SS-J .035X180CM (WIRE) ×2 IMPLANT
WIRE AMPLATZ SS-J .035X260CM (WIRE) ×2 IMPLANT
WIRE BENTSON .035X145CM (WIRE) ×4 IMPLANT
WIRE ROSEN-J .035X260CM (WIRE) ×4 IMPLANT
WIRE STIFF LUNDERQUIST 260MM (WIRE) ×8 IMPLANT
YANKAUER SUCT BULB TIP NO VENT (SUCTIONS) ×2 IMPLANT

## 2015-12-02 NOTE — H&P (Signed)
[facility] [place]  Patient name: Olivia Canal GoinsMRN: [id number] DOB: 1952-12-04Sex: female  REASON FOR VISIT: follow up AAA  HPI: This is a 65 year old female who presented to the [facility] ER in March 2017 with abdominal pain, following a stress test. She had a CT scan which revealed a 4.2cm AAA which had increased from 3.5 cm. There was inflammation surrounding the AAA. Since then, her pain has improved, but it still persists. She denies fevers. She does complain of decreased energy, but this has been going on for a long time.  She suffers from CAD, and is s/p MI in 2015, treated with stenting x2. She is on Brilinta. Her recent stress test was a normal study. She suffers from COPD, requiring inhalers. She quit smoking in 2015. She is on a statin for hypercholesterolemia, and an ACE-I for hypertension.  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 The pain appeared to be getting better, however she is back today stating that it still persists.  Past Medical History  Diagnosis Date  . AAA (abdominal aortic aneurysm) (HCC)   . Tobacco abuse   . HTN (hypertension)   . COPD (chronic obstructive pulmonary disease) (HCC)   . Asthma   . HLD (hyperlipidemia)   . GERD (gastroesophageal reflux disease)   . Chronic low back pain     (Old vertebral fracture)  . Arthritis   . Coronary artery disease 09/08/13    with PCI  . NSTEMI (non-ST elevated myocardial infarction) (HCC) 09/08/13  . Heart palpitations     prn toprol for rapid HR  . History of surgery on arm     Family History  Problem Relation Age of Onset  . Heart attack Father   . Cancer Father     Esophageal  . Heart disease Father     before age 59  . Heart attack Son 68  . Heart disease Son     before age 68  . Hypertension Mother       SOCIAL HISTORY: Social History  Substance Use Topics  . Smoking status: Former Smoker -- 0.50 packs/day for 37 years    Types: Cigarettes    Quit date: 09/08/2013  . Smokeless tobacco: Never Used  . Alcohol Use: No    Allergies  Allergen Reactions  . Bee Venom Anaphylaxis, Hives and Swelling  . Penicillins Other (See Comments)    PATIENT REPORTS "MOTHER TOLD HER SHE HAD ALLERGY TO PCN"  . Hydrocodone Other (See Comments)  . Morphine And Related Rash    Current Outpatient Prescriptions  Medication Sig Dispense Refill  . acetaminophen (TYLENOL) 325 MG tablet Take 325 mg by mouth every 6 (six) hours as needed for moderate pain.    Marland Kitchen albuterol (PROVENTIL HFA;VENTOLIN HFA) 108 (90 BASE) MCG/ACT inhaler Inhale 1 puff into the lungs every 6 (six) hours as needed for wheezing or shortness of breath.    Marland Kitchen albuterol (PROVENTIL) (2.5 MG/3ML) 0.083% nebulizer solution Take 2.5 mg by nebulization every 6 (six) hours as needed for wheezing or shortness of breath.    . ALPRAZolam (XANAX) 0.5 MG tablet Take 0.5 mg by mouth 3 (three) times daily as needed for anxiety or sleep. *Patient is prescribed one tablets three times daily as needed for anxiety    . aspirin EC 81 MG tablet Take 81 mg by mouth daily.    Marland Kitchen atorvastatin (LIPITOR) 40 MG tablet Take 1 tablet (40  mg total) by mouth daily. KEEP OV. 30 tablet 0  . citalopram (CELEXA) 20 MG tablet Take 20 mg by mouth daily.    . cyclobenzaprine (FLEXERIL) 10 MG tablet Take 1 tablet (10 mg total) by mouth 3 (three) times daily as needed for muscle spasms. 30 tablet 6  . folic acid (FOLVITE) 1 MG tablet Take 1 tablet (1 mg total) by mouth daily. 30 tablet 2  . iron polysaccharides (NIFEREX) 150 MG capsule Take 1 capsule (150 mg total) by mouth daily. 30 capsule 2  . isosorbide mononitrate (IMDUR) 30 MG 24 hr tablet Take 30 mg by mouth daily.    Marland Kitchen  lisinopril (PRINIVIL,ZESTRIL) 5 MG tablet take 1 tablet by mouth once daily 30 tablet 10  . MELATONIN PO Take 1 tablet by mouth daily as needed (sleep).    . Menthol, Topical Analgesic, (ICY HOT BACK EX) Apply 1 application topically daily as needed (on back).    . metoprolol succinate (TOPROL-XL) 50 MG 24 hr tablet Take 1 tablet (50 mg total) by mouth 2 (two) times daily. PLEASE CONTACT OFFICE FOR ADDITIONAL REFILLS 60 tablet 1  . montelukast (SINGULAIR) 10 MG tablet Take 10 mg by mouth at bedtime.    . nitroGLYCERIN (NITROSTAT) 0.4 MG SL tablet Place 1 tablet (0.4 mg total) under the tongue every 5 (five) minutes as needed for chest pain. 25 tablet 5  . Omega-3 Fatty Acids (FISH OIL EXTRA STRENGTH PO) Take by mouth.    . pantoprazole (PROTONIX) 40 MG tablet Take 1 tablet (40 mg total) by mouth every morning. 60 tablet 5  . ticagrelor (BRILINTA) 60 MG TABS tablet Take 1 tablet (60 mg total) by mouth 2 (two) times daily. 60 tablet 11   No current facility-administered medications for this visit.    REVIEW OF SYSTEMS:   denotes positive finding,  denotes negative finding Cardiac  Comments:  Chest pain or chest pressure:    Shortness of breath upon exertion:    Short of breath when lying flat:    Irregular heart rhythm:        Vascular    Pain in calf, thigh, or hip brought on by ambulation:    Pain in feet at night that wakes you up from your sleep:     Blood clot in your veins:    Leg swelling:         Pulmonary    Oxygen at home:    Productive cough:     Wheezing:  x       Neurologic    Sudden weakness in arms or legs:     Sudden numbness in arms or legs:     Sudden onset of difficulty speaking or slurred speech:    Temporary loss of vision in one eye:     Problems with dizziness:         Gastrointestinal    Blood in stool:     Vomited blood:          Genitourinary    Burning when urinating:     Blood in urine:        Psychiatric    Major depression:         Hematologic    Bleeding problems:    Problems with blood clotting too easily:        Skin    Rashes or ulcers:        Constitutional    Fever or chills:      PHYSICAL  EXAM: Filed Vitals:   10/28/15 1139  BP: 110/75  Pulse: 68  Temp: 97.4 F (36.3 C)  Resp: 16  Height: 5\' 4"  (1.626 m)  Weight: 154 lb (69.854 kg)  SpO2: 96%    GENERAL: The patient is a well-nourished female, in no acute distress. The vital signs are documented above. CARDIAC: There is a regular rate and rhythm.  VASCULAR: palpable pedal PULMONARY: There is good air exchange bilaterally without wheezing or rales. ABDOMEN: Soft and non-tender with normal pitched bowel sounds.  MUSCULOSKELETAL: There are no major deformities or cyanosis. NEUROLOGIC: No focal weakness or paresthesias are detected. SKIN: There are no ulcers or rashes noted. PSYCHIATRIC: The patient has a normal affect.  DATA:  The patient had a repeat CT angiogram today which I have reviewed and we'll use for planning purposes for fenestrated endovascular aneurysm repair  MEDICAL ISSUES: AAA: I discussed with the patient that based on her anatomy in her infrarenal neck, she is not a candidate for straightforward endovascular repair and will need a fenestrated repair. We discussed the increased risks with this procedure including the risk of renal failure. I also discussed the risks of aneurysm repair in general which were detailed in the previous note. She understands our device will need to be custom made and shipped to the [place]. This means that her repair will be an approximate 4-6 weeks. I think because of the inflammatory component within her aneurysm that it is reasonable to wait to get a fenestrated device so that she does not get  exposed to the risks of open surgery in the setting of an inflammatory aneurysm. She has received cardiac clearance. She will need to be off of her Brilinta for 5 days prior to her operation which will be scheduled in late June/early July    [name] [facility] [place]: [phone]         Has had worsening abd pain and changes in CT scan.  Have expedited graft production and delivery.  SHe remains stable CV:RRR Pulm: CTA Abd - tender to palpation Plan FEVAR  Olivia Wade

## 2015-12-02 NOTE — Anesthesia Procedure Notes (Signed)
Procedure Name: Intubation Date/Time: 12/02/2015 7:58 AM Performed by: Virgel Gess LEFFEW Pre-anesthesia Checklist: Patient identified, Patient being monitored, Timeout performed, Emergency Drugs available and Suction available Patient Re-evaluated:Patient Re-evaluated prior to inductionOxygen Delivery Method: Circle System Utilized Preoxygenation: Pre-oxygenation with 100% oxygen Intubation Type: IV induction Ventilation: Mask ventilation without difficulty and Oral airway inserted - appropriate to patient size Laryngoscope Size: Mac and 3 Grade View: Grade I Tube type: Oral Tube size: 7.5 mm Number of attempts: 1 Airway Equipment and Method: Stylet Placement Confirmation: ETT inserted through vocal cords under direct vision,  positive ETCO2 and breath sounds checked- equal and bilateral Secured at: 21 cm Tube secured with: Tape Dental Injury: Teeth and Oropharynx as per pre-operative assessment

## 2015-12-02 NOTE — Transfer of Care (Signed)
Immediate Anesthesia Transfer of Care Note  Patient: Olivia Wade  Procedure(s) Performed: Procedure(s): ABDOMINAL AORTIC ENDOVASCULAR FENESTRATED STENT GRAFT (N/A)  Patient Location: PACU  Anesthesia Type:General  Level of Consciousness: awake, patient cooperative and responds to stimulation  Airway & Oxygen Therapy: Patient Spontanous Breathing and Patient connected to nasal cannula oxygen  Post-op Assessment: Report given to RN, Post -op Vital signs reviewed and stable and Patient moving all extremities X 4  Post vital signs: Reviewed and stable  Last Vitals:  Filed Vitals:   12/02/15 0549  BP: 96/55  Pulse: 68  Temp: 36.6 C  Resp: 18    Last Pain: There were no vitals filed for this visit.    Patients Stated Pain Goal: 3 (12/02/15 0558)  Complications: No apparent anesthesia complications

## 2015-12-02 NOTE — Progress Notes (Signed)
ANTIBIOTIC CONSULT NOTE - INITIAL  Pharmacy Consult for Vancomycin Indication: surgical prophylaxis  Allergies  Allergen Reactions  . Bee Venom Anaphylaxis, Hives and Swelling  . Hydrocodone Other (See Comments)  . Iodine Other (See Comments)  . Penicillins Other (See Comments)    PATIENT REPORTS "MOTHER TOLD HER SHE HAD ALLERGY TO PCN"  UNKNOWN REACTION TO PCN PCN reaction causing immediate rash, facial/tongue/throat swelling, SOB or lightheadedness with hypotension: unknown PCN reaction causing severe rash involving mucus membranes or skin necrosis: unknown PCN reaction that required hospitalization unknown PCN reaction occurring within the last 10 years: unknown  . Morphine And Related Rash    Patient Measurements: Height:  (162.6 cm) Weight: 153 lb (69.4 kg) IBW/kg (Calculated) : 54.7  Labs:  Recent Labs  12/02/15 1300  WBC 11.4*  HGB 8.3*  PLT 291  CREATININE 0.84   Estimated Creatinine Clearance: 65.6 mL/min (by C-G formula based on Cr of 0.84).  Microbiology: Recent Results (from the past 720 hour(s))  MRSA PCR Screening     Status: None   Collection Time: 11/21/15 11:11 AM  Result Value Ref Range Status   MRSA by PCR NEGATIVE NEGATIVE Final    Comment:        The GeneXpert MRSA Assay (FDA approved for NASAL specimens only), is one component of a comprehensive MRSA colonization surveillance program. It is not intended to diagnose MRSA infection nor to guide or monitor treatment for MRSA infections. Performed at [facility]   Surgical pcr screen     Status: Abnormal   Collection Time: 11/28/15  1:53 PM  Result Value Ref Range Status   MRSA, PCR NEGATIVE NEGATIVE Final   Staphylococcus aureus POSITIVE (A) NEGATIVE Final    Comment:        The Xpert SA Assay (FDA approved for NASAL specimens in patients over 37 years of age), is one component of a comprehensive surveillance program.  Test performance has been validated by  [facility] for patients greater than or equal to 24 year old. It is not intended to diagnose infection nor to guide or monitor treatment.     Medical History: Past Medical History  Diagnosis Date  . AAA (abdominal aortic aneurysm) (HCC)   . Tobacco abuse   . HTN (hypertension)   . COPD (chronic obstructive pulmonary disease) (HCC)   . Asthma   . HLD (hyperlipidemia)   . GERD (gastroesophageal reflux disease)   . Chronic low back pain     (Old vertebral fracture)  . Arthritis   . Coronary artery disease 09/08/13    with PCI  . NSTEMI (non-ST elevated myocardial infarction) (HCC) 09/08/13  . Heart palpitations     prn toprol for rapid HR  . History of surgery on arm   . Complication of anesthesia   . PONV (postoperative nausea and vomiting)   . Family history of adverse reaction to anesthesia      mother - slow to awaken  . Anxiety     Panic attacks  . Depression    Assessment:   65 yr old female s/p endovascular stent graft repair.   PCN allergy, Vancomycin 1 gm IV given pre-op ~8am.   For Vanc q12h x 2 doses post-procedure.  Goal of Therapy:  Vancomycin trough level 10-15 mcg/ml  Plan:    Adjust Vancomycin from 1 gm to 750 mg IV q12hrs x 2 doses.     Begin at 8pm, last dose at 8am on 6/24.  Dennie Fetters, [place] Pager: [phone] 12/02/2015,4:05 PM

## 2015-12-02 NOTE — Anesthesia Postprocedure Evaluation (Signed)
Anesthesia Post Note  Patient: Olivia Wade  Procedure(s) Performed: Procedure(s) (LRB): ABDOMINAL AORTIC ENDOVASCULAR FENESTRATED STENT GRAFT (N/A)  Patient location during evaluation: PACU Anesthesia Type: General Level of consciousness: awake and alert Pain management: pain level controlled Vital Signs Assessment: post-procedure vital signs reviewed and stable Respiratory status: spontaneous breathing, nonlabored ventilation, respiratory function stable and patient connected to nasal cannula oxygen Cardiovascular status: blood pressure returned to baseline and stable Postop Assessment: no signs of nausea or vomiting Anesthetic complications: no    Last Vitals:  Filed Vitals:   12/02/15 1445 12/02/15 1520  BP:  123/88  Pulse: 68 67  Temp:    Resp: 10 14    Last Pain:  Filed Vitals:   12/02/15 1528  PainSc: 7                  Kennieth Rad

## 2015-12-02 NOTE — Anesthesia Preprocedure Evaluation (Signed)
Anesthesia Evaluation  Patient identified by MRN, date of birth, ID band Patient awake    Reviewed: Allergy & Precautions, NPO status , Patient's Chart, lab work & pertinent test results  Airway Mallampati: II  TM Distance: >3 FB Neck ROM: Full    Dental  (+) Edentulous Upper, Edentulous Lower   Pulmonary former smoker,    breath sounds clear to auscultation       Cardiovascular hypertension,  Rhythm:Regular Rate:Normal     Neuro/Psych    GI/Hepatic   Endo/Other    Renal/GU      Musculoskeletal   Abdominal   Peds  Hematology   Anesthesia Other Findings   Reproductive/Obstetrics                             Anesthesia Physical Anesthesia Plan  ASA: III  Anesthesia Plan: General   Post-op Pain Management:    Induction: Intravenous  Airway Management Planned: Oral ETT  Additional Equipment: CVP and Arterial line  Intra-op Plan:   Post-operative Plan: Extubation in OR  Informed Consent: I have reviewed the patients History and Physical, chart, labs and discussed the procedure including the risks, benefits and alternatives for the proposed anesthesia with the patient or authorized representative who has indicated his/her understanding and acceptance.     Plan Discussed with: CRNA and Anesthesiologist  Anesthesia Plan Comments:         Anesthesia Quick Evaluation

## 2015-12-02 NOTE — Progress Notes (Signed)
  [facility] Day of Surgery Note  Subjective: Patient seen in PACU. Complaining of back pain.   Filed Vitals:   12/02/15 1415 12/02/15 1430  BP:    Pulse: 69 72  Temp:    Resp: 15 24   Bilateral groins soft without hematoma. Leg compartments soft. Feet warm. Palpable DP pulses bilaterally. Motor and sensation intact.   Assessment/Plan:  This is a 65 y.o. female who is s/p fenestrated endovascular stent graft repair.   Stable post-op.  Feet warm and well-perfused.  To 3S when bed available.   Maris Berger, [place] Pager: [phone] 12/02/2015 2:36 PM

## 2015-12-03 LAB — POCT ACTIVATED CLOTTING TIME
ACTIVATED CLOTTING TIME: 208 s
ACTIVATED CLOTTING TIME: 213 s
Activated Clotting Time: 208 seconds
Activated Clotting Time: 191 seconds

## 2015-12-03 LAB — BASIC METABOLIC PANEL
ANION GAP: 7 (ref 5–15)
BUN: 7 mg/dL (ref 6–20)
CO2: 22 mmol/L (ref 22–32)
Calcium: 7.7 mg/dL — ABNORMAL LOW (ref 8.9–10.3)
Chloride: 106 mmol/L (ref 101–111)
Creatinine, Ser: 0.92 mg/dL (ref 0.44–1.00)
GFR calc Af Amer: >60 mL/min (ref >60)
GFR calc non Af Amer: >60 mL/min (ref >60)
GLUCOSE: 142 mg/dL — AB (ref 65–99)
POTASSIUM: 3.3 mmol/L — AB (ref 3.5–5.1)
Sodium: 135 mmol/L (ref 135–145)

## 2015-12-03 LAB — CBC
HEMATOCRIT: 24.9 % — AB (ref 36.0–46.0)
Hemoglobin: 7.7 g/dL — ABNORMAL LOW (ref 12.0–15.0)
MCH: 27.8 pg (ref 26.0–34.0)
MCHC: 30.9 g/dL (ref 30.0–36.0)
MCV: 89.9 fL (ref 78.0–100.0)
PLATELETS: 289 10*3/uL (ref 150–400)
RBC: 2.77 MIL/uL — AB (ref 3.87–5.11)
RDW: 14.9 % (ref 11.5–15.5)
WBC: 7.7 10*3/uL (ref 4.0–10.5)

## 2015-12-03 NOTE — Progress Notes (Addendum)
[facility] Progress Note  Subjective  - POD #1  Feels ok. Having some numbness in her feet that is worse than normal.   Objective Filed Vitals:   12/03/15 0013 12/03/15 0324  BP: 139/65 116/61  Pulse: 75 84  Temp: 98.2 F (36.8 C) 98.1 F (36.7 C)  Resp: 17 21    Intake/Output Summary (Last 24 hours) at 12/03/15 0715 Last data filed at 12/03/15 0325  Gross per 24 hour  Intake 2688.33 ml  Output   2485 ml  Net 203.33 ml   Abdomen soft, non distended, mild tenderness to palpation.  Groins without hematoma.  Palpable DP pulses bilaterally.   Assessment/Planning: 65 y.o. female is s/p: fenestrated endovascular stent graft repair 1 Day Post-Op   Doing okay this am. Feet with palpable pulses. Has chronic numbness of feet. Back pain likely a component. No evidence of compartment syndrome. Good UOP. Creatinine is stable. Will restart ACEI.  Hypokalemia: replete. ABLA: Hgb 7.7. 8.3 yesterday. Tolerating. Will monitor. Restart Brilinta. Needs to ambulate and eat this am.  For renal artery duplex and SMA duplex today.  If ambulating ok and tolerating diet, plan d/c home later today.   Addendum Vascular lab called saying renal/SMA duplex unable to be performed this weekend. Will schedule as outpatient next week.   Raymond Gurney 12/03/2015 7:15 AM --  Laboratory CBC    Component Value Date/Time   WBC 7.7 12/03/2015 0300   HGB 7.7* 12/03/2015 0300   HCT 24.9* 12/03/2015 0300   PLT 289 12/03/2015 0300    BMET    Component Value Date/Time   NA 135 12/03/2015 0300   K 3.3* 12/03/2015 0300   CL 106 12/03/2015 0300   CO2 22 12/03/2015 0300   GLUCOSE 142* 12/03/2015 0300   BUN 7 12/03/2015 0300   CREATININE 0.92 12/03/2015 0300   CREATININE 0.83 08/18/2015 1213   CALCIUM 7.7* 12/03/2015 0300   GFRNONAA >60 12/03/2015 0300   GFRAA >60 12/03/2015 0300    COAG Lab Results  Component Value Date   INR 1.28 12/02/2015   INR 1.06 11/20/2015   INR 1.11 04/04/2015   No results found for: PTT  Antibiotics Anti-infectives    Start     Dose/Rate Route Frequency Ordered Stop   12/02/15 2000  vancomycin (VANCOCIN) IVPB 750 mg/150 ml premix     750 mg 150 mL/hr over 60 Minutes Intravenous Every 12 hours 12/02/15 1604 12/03/15 1959   12/02/15 1930  vancomycin (VANCOCIN) IVPB 1000 mg/200 mL premix  Status:  Discontinued     1,000 mg 200 mL/hr over 60 Minutes Intravenous Every 12 hours 12/02/15 1550 12/02/15 1604   12/02/15 0700  vancomycin (VANCOCIN) IVPB 1000 mg/200 mL premix     1,000 mg 200 mL/hr over 60 Minutes Intravenous To [facility]Stay [facility] 12/01/15 0859 12/02/15 0901       Maris Berger, PA-C [facility] Office: [phone] Pager: [phone] 12/03/2015 7:15 AM  Addendum  I have independently interviewed and examined the patient, and I agree with the physician assistant's findings. Pt calf are completely soft.  She baseline has some neuropathic sx in her feet.  Slight drop in H/H consistent with blood loss associated with use of Cook sheath, which frequently have 200-300 cc blood loss.  Pt is completely asx.  Pt would like to avoid transfusion if possible.  I don't see an absolute indication for transfusion currently.  If pt tolerates PO and ambulates ok, she can be discharged.  Mesenteric and renal duplex will have to be done in our office this coming week.  Leonides Sake, MD [facility] [place] Office: [phone] Pager: [phone]  12/03/2015, 8:04 AM

## 2015-12-04 NOTE — Op Note (Signed)
Patient name: Olivia Wade MRN: [id number] DOB: May 18, 1951 Sex: female  12/02/2015 Pre-operative Diagnosis: Inflammatory Juxta-renal abdominal aortic aneurysm Post-operative diagnosis:  Same Surgeon:  [name] Assistants:  Doreatha Massed Procedure:   #1: Fenestrated endovascular repair of juxtarenal abdominal aortic aneurysm   #2: Bilateral ultrasound-guided common femoral artery access   #3: Abdominal aortogram    #4: Stent, left renal artery   #5: Stent, right renal artery Anesthesia:  Gen. Blood Loss:  See anesthesia record Specimens:  None  Findings:  Complete exclusion of aneurysm with preserved patency of the left and right renal arteries and superior mesenteric artery  Indications:  The patient initially presented several months ago with abdominal pain.  CT scan revealed a juxtarenal inflammatory abdominal aortic aneurysm.  Her pain improved and therefore I elected to proceed with fenestrated repair.  While we were waiting for the graft to be made, the patient developed worsening symptoms.  A CT scan was repeated which showed enlargement of her aneurysm to 6 cm.  Again, her pain stabilized and she was able to delay repair until her graft was ready.  She comes in today for repair  Devices used:  Proximal [employer] P2-24-94.  Distal Adriana Simas D-06-08-75.  Ipsilateral extension was a left Cook ZSLE 11-56.  Contralateral right was a Chales Abrahams 11-74  Procedure:  The patient was identified in the holding area and taken to [facility] OR ROOM 16  The patient was then placed supine on the table. general anesthesia was administered.  The patient was prepped and draped in the usual sterile fashion.  A time out was called and antibiotics were administered.  Ultrasound was used to evaluate bilateral common femoral arteries.  They were mildly calcified and patent.  A #11 blade was used to make a skin nick.  Using an 18-gauge needle, bilateral common femoral arteries were cannulated under ultrasound  guidance.  An 035 wires were advanced without resistance.  An 8 [place] dilator was used to dilate the subcutaneous tract.  Provide devices were deployed at the 11:00 and 1:00 position for pre-closure.  8 French sheaths were placed bilaterally.  The patient was fully heparinized.  Heparin levels were monitored throughout the case with ACT.  Over a Lunderquist wire from the left side, the 8 French sheath was exchanged out for the proximal main body which was a Cook Zenith P2-24-94.  Up the right side, a pigtail catheter was advanced and an abdominal aortogram was performed locating the renal arteries.  We then used the overlay software to make sure the renal arteries were appropriately located.  A Lunderquist wire was placed up the right side and the 8 French sheath was exchanged out for a 18 [place] dry seal sheath.  The patient's aortic bifurcation was rather tight on CT scan, however had no difficulty advancing the sheath through the bifurcation.  I then made sure the proximal device was appropriately oriented.  The first 2 stents were exposed.  An additional arteriogram was used to confirm that the device was in the appropriate location with regards to orientation and location of the renal arteries.  I then deployed the remaining portion of the proximal piece.  Using a Berenstein 2 catheter, I cannulated the bottom portion of the main body.  The 18 French sheath from the right side was then advanced into the proximal piece.  The 18 French sheath was then sutured into position with 2-0 silk tie.  A 6 French 55 cm-sized Ansel 1 sheath was  advanced through the dry seal sheath.  Using a Berenstein catheter and a Glidewire, the right renal fenestration was cannulated as was the right renal artery.  The catheter was able to be advanced out into the mid right renal artery and a Rosen wire was placed.  The catheter was removed and the dilator was reinserted into the 6 French sheath.  A 6 French sheath was then advanced out  about 2 cm into the right renal artery.  Next, a second 6 French sheath was advanced to the dry seal sheath.  Because of the angulation of the sheath, had a difficult time cannulating the left renal artery.  Ultimately I was able to get a Glidewire into the left renal artery.  There was some difficulty a quick cross catheter was able to be advanced into the distal left renal artery.  A Rosen wire was inserted.  The quick cross catheter was removed and the dilator was inserted back into the sheath which was able to be advanced out into the mid left renal artery.  A contrast injection was performed, confirming that I was in the main bilateral renal arteries with the 6 French Ansel 1 sheaths.  Satisfied with the location of the graft, I deployed the suprarenal stent.  Next, from the left side 832 coated balloon was used to mold the entire length of the proximal device.  Once this was done I first deployed the left renal stent with about 4 mm of the stent protruding into the main body.  A 10 x 2 balloon was then used to fit and trumpet the left renal stent.  I was unable to flare the superior portion of the stent.  Next, the right renal stent was deployed with about 3 mm of the stent protruding into the proximal piece.  Again this was flared with a 10 x 2 balloon.  I again could not flare the superior side.  I then performed an arteriogram which showed good position of the device and the renal stents with preserved patency of bilateral renal arteries.  Access to the renal arteries was then given up.  The remaining constraints on the proximal device word released and the deployment mechanism was removed.  I then prepared the distal piece.  This was a Adriana Simas D-06-08-75.  This was inserted up the left side and properly positioned with the contralateral gate slightly anterior to the right.  This device was then deployed down to the contralateral gate.  From the right side, the contralateral gate was cannulated with a  Berenstein 2 catheter and a Glidewire.  Once it was cannulated, a Lunderquist wire was inserted.  I then inserted a coated balloon into the contralateral limb and inflated it which confirmed that I had successfully cannulated the gate.  The image detector was rotated to the left anterior oblique position and a retrograde injection was performed with a marker catheter in place, locating the left hypogastric artery.  I selected a Chales Abrahams 11-74 limb and deployed this landing proximal to the right hypogastric artery.  I then deployed the remaining portion of the ipsilateral graft.  The image detector was rotated to a right anterior oblique position and a retrograde injection was performed through the sheath in the left groin was located the left hypogastric artery.  For the ipsilateral extension I selected a Cook ZSLE 11-56 device.  This was inserted and deployed landing proximal to the left hypogastric artery.  A coated balloon was then used to mold the device  overlap and the proximal and distal extensions.  The bifurcation was aggressively dilated to relieve the stenosis.  A completion arteriogram was then performed which showed complete exclusion of the aneurysm with patency of bilateral renal arteries and bilateral hypogastric arteries.  I then performed a lateral injection where the patency of the superior mesenteric and celiac artery were confirmed.  This point I was satisfied with the repair.  The stiff wires were exchanged out for 035 Bentson wires.  The sheaths were removed and the pro-glide devices were used to close the arteriotomy sites.  50 mg of protamine was given.  Once hemostasis was satisfactory the pro-glide devices were cut.  I cauterized the skin. Then placed Dermabond.  The patient tolerated the procedure well.  She had excellent pedal pulses after the procedure.  There were no immediate complications.   Disposition:  To PACU in stable condition.   Juleen China, M.D. [facility] [place] Office: [phone] Pager:  [phone]

## 2015-12-05 ENCOUNTER — Telehealth: Payer: Self-pay | Admitting: Surgery

## 2015-12-05 ENCOUNTER — Other Ambulatory Visit: Payer: Self-pay | Admitting: *Deleted

## 2015-12-05 DIAGNOSIS — Z48812 Encounter for surgical aftercare following surgery on the circulatory system: Secondary | ICD-10-CM

## 2015-12-05 DIAGNOSIS — K551 Chronic vascular disorders of intestine: Secondary | ICD-10-CM

## 2015-12-05 DIAGNOSIS — I771 Stricture of artery: Secondary | ICD-10-CM

## 2015-12-05 LAB — TYPE AND SCREEN
ABO/RH(D): B NEG
ANTIBODY SCREEN: NEGATIVE
UNIT DIVISION: 0
Unit division: 0

## 2015-12-05 NOTE — Telephone Encounter (Signed)
Sched labs 6/28 at 8:00. Spoke to pt to inform them of appt.

## 2015-12-05 NOTE — Telephone Encounter (Signed)
-----   Message from Sharee Pimple, RN sent at 12/05/2015  9:56 AM EDT ----- Regarding: ASAP  This goes with other message.   ----- Message -----    From: Fransisco Hertz, MD    Sent: 12/03/2015   8:06 AM      To: [address]  JAYLIANIE ROSIAK [id number] March 15, 1951  Dr. Myra Gianotti would like this recent Fenestrated [facility] pt to have a mesenteric and B renal duplex this week to check the patency of the SMA and B renal stents

## 2015-12-06 NOTE — Discharge Summary (Signed)
[facility] Discharge Summary  Olivia Wade 08/07/1950 65 y.o. female  [id number]  Admission Date: 12/02/2015  Discharge Date: 12/03/2015  Physician: Coral Else, MD  Admission Diagnosis: Abdominal aortic aneurysm I71.4  HPI:   This is a 65 y.o. female who presented to the [facility] ER in March 2017 with abdominal pain, following a stress test. She had a CT scan which revealed a 4.2cm AAA which had increased from 3.5 cm. There was inflammation surrounding the AAA. Since then, her pain has improved, but it still persists. She denies fevers. She does complain of decreased energy, but this has been going on for a long time.  She suffers from CAD, and is s/p MI in 2015, treated with stenting x2. She is on Brilinta. Her recent stress test was a normal study. She suffers from COPD, requiring inhalers. She quit smoking in 2015. She is on a statin for hypercholesterolemia, and an ACE-I for hypertension.  Dr. Myra Gianotti has tried to manage the patient's abdominal pain without repair of her inflammatory aneurysm. The pain appeared to be getting better, however she is back today stating that it still persists.  Hospital Course:  The patient was admitted to the hospital and taken to the operating room on 12/02/2015 and underwent: fenestrated endovascular repair of juxtarenal abdominal aortic aneurysm.     The patient tolerated the procedure well and was transported to the PACU in stable condition.   The patient did well post-operatively. She had no signs of compartment syndrome. At baseline, she has some numbness of her feet. She complained of worsening numbness initially post-op, but this improved by POD 1. She had palpable pedal pulses bilaterally. Her renal function was normal with good UOP. Her bilateral groins were soft without hematoma. She had acute blood loss anemia that she was tolerating. There were no indications for transfusion.   Due to vascular  staff unavailability over the weekend, her renal and mesenteric duplexes were arranged as an outpatient. The patient was tolerating a diet and ambulating well. She was discharged home on POD 1 in good condition.     CBC    Component Value Date/Time   WBC 7.7 12/03/2015 0300   RBC 2.77* 12/03/2015 0300   HGB 7.7* 12/03/2015 0300   HCT 24.9* 12/03/2015 0300   PLT 289 12/03/2015 0300   MCV 89.9 12/03/2015 0300   MCH 27.8 12/03/2015 0300   MCHC 30.9 12/03/2015 0300   RDW 14.9 12/03/2015 0300   LYMPHSABS 2.5 11/21/2015 1205   MONOABS 0.8 11/21/2015 1205   EOSABS 0.1 11/21/2015 1205   BASOSABS 0.0 11/21/2015 1205    BMET    Component Value Date/Time   NA 135 12/03/2015 0300   K 3.3* 12/03/2015 0300   CL 106 12/03/2015 0300   CO2 22 12/03/2015 0300   GLUCOSE 142* 12/03/2015 0300   BUN 7 12/03/2015 0300   CREATININE 0.92 12/03/2015 0300   CREATININE 0.83 08/18/2015 1213   CALCIUM 7.7* 12/03/2015 0300   GFRNONAA >60 12/03/2015 0300   GFRAA >60 12/03/2015 0300     Discharge Instructions:   The patient is discharged to home with extensive instructions on wound care and progressive ambulation.  They are instructed not to drive or perform any heavy lifting until returning to see the physician in his office.  Discharge Instructions    ABDOMINAL PROCEDURE/ANEURYSM REPAIR/AORTO-BIFEMORAL BYPASS:  Call MD for increased abdominal pain; cramping diarrhea; nausea/vomiting    Complete by:  As directed  Call MD for:  redness, tenderness, or signs of infection (pain, swelling, bleeding, redness, odor or green/yellow discharge around incision site)    Complete by:  As directed      Call MD for:  severe or increased pain, loss or decreased feeling  in affected limb(s)    Complete by:  As directed      Call MD for:  temperature >100.5    Complete by:  As directed      Discharge wound care:    Complete by:  As directed   Shower daily with soap and water starting 12/04/15.     Driving  Restrictions    Complete by:  As directed   No driving for 2 weeks and while taking pain medication.     Lifting restrictions    Complete by:  As directed   No lifting for 4 weeks     Resume previous diet    Complete by:  As directed            Discharge Diagnosis:  Abdominal aortic aneurysm I71.4  Secondary Diagnosis: Patient Active Problem List   Diagnosis Date Noted  . S/P AAA repair using bifurcation graft 12/02/2015  . Aneurysm of infrarenal abdominal aorta (HCC) 11/21/2015  . Abdominal aortic aneurysm (AAA) >39 mm diameter (HCC) 11/21/2015  . Chest pain 03/27/2014  . Numbness and tingling in right hand 02/05/2014  . Sinus tachycardia, with exertion 12/23/2013  . Chest pain at rest, negative MI, probable GI 12/22/2013  . Syncope, secondary to NTG and standing 12/22/2013  . CAD (coronary artery disease), recent DES stents to RCA and LAD in 3/15 and 4/15 09/14/2013  . NSTEMI (non-ST elevated myocardial infarction) (HCC) 09/08/2013  . Unstable angina (HCC) 09/08/2013  . ACS (acute coronary syndrome) (HCC) 09/08/2013  . Abdominal aortic aneurysm 05/26/2013  . HTN (hypertension) 05/26/2013  . Hyperlipidemia 05/26/2013  . GERD (gastroesophageal reflux disease) 05/26/2013  . Chronic low back pain 05/26/2013  . Asthma with COPD 05/26/2013  . Tobacco abuse 05/26/2013   Past Medical History  Diagnosis Date  . AAA (abdominal aortic aneurysm) (HCC)   . Tobacco abuse   . HTN (hypertension)   . COPD (chronic obstructive pulmonary disease) (HCC)   . Asthma   . HLD (hyperlipidemia)   . GERD (gastroesophageal reflux disease)   . Chronic low back pain     (Old vertebral fracture)  . Arthritis   . Coronary artery disease 09/08/13    with PCI  . NSTEMI (non-ST elevated myocardial infarction) (HCC) 09/08/13  . Heart palpitations     prn toprol for rapid HR  . History of surgery on arm   . Complication of anesthesia   . PONV (postoperative nausea and vomiting)   . Family  history of adverse reaction to anesthesia      mother - slow to awaken  . Anxiety     Panic attacks  . Depression        Medication List    TAKE these medications        acetaminophen 325 MG tablet  Commonly known as:  TYLENOL  Take 325 mg by mouth every 6 (six) hours as needed for moderate pain.     albuterol 108 (90 Base) MCG/ACT inhaler  Commonly known as:  PROVENTIL HFA;VENTOLIN HFA  Inhale 1 puff into the lungs every 6 (six) hours as needed for wheezing or shortness of breath.     albuterol (2.5 MG/3ML) 0.083% nebulizer solution  Commonly known as:  PROVENTIL  Take 2.5 mg by nebulization every 6 (six) hours as needed for wheezing or shortness of breath.     ALPRAZolam 0.5 MG tablet  Commonly known as:  XANAX  Take 0.5 mg by mouth 3 (three) times daily as needed for anxiety or sleep. *Patient is prescribed one tablets three times daily as needed for anxiety     aspirin 81 MG tablet  Take 81 mg by mouth daily.     atorvastatin 40 MG tablet  Commonly known as:  LIPITOR  Take 1 tablet (40 mg total) by mouth daily.     citalopram 20 MG tablet  Commonly known as:  CELEXA  Take 20 mg by mouth daily.     cyclobenzaprine 10 MG tablet  Commonly known as:  FLEXERIL  Take 1 tablet (10 mg total) by mouth 3 (three) times daily as needed for muscle spasms.     FISH OIL EXTRA STRENGTH PO  Take by mouth.     folic acid 1 MG tablet  Commonly known as:  FOLVITE  Take 1 tablet (1 mg total) by mouth daily.     ICY HOT BACK EX  Apply 1 application topically daily as needed (on back).     iron polysaccharides 150 MG capsule  Commonly known as:  NIFEREX  Take 1 capsule (150 mg total) by mouth daily.     isosorbide mononitrate 30 MG 24 hr tablet  Commonly known as:  IMDUR  Take 30 mg by mouth daily.     lisinopril 5 MG tablet  Commonly known as:  PRINIVIL,ZESTRIL  take 1 tablet by mouth once daily     MELATONIN PO  Take 1 tablet by mouth daily as needed (sleep).      metoprolol succinate 50 MG 24 hr tablet  Commonly known as:  TOPROL-XL  Take 1 tablet (50 mg total) by mouth 2 (two) times daily.     montelukast 10 MG tablet  Commonly known as:  SINGULAIR  Take 10 mg by mouth at bedtime.     nitroGLYCERIN 0.4 MG SL tablet  Commonly known as:  NITROSTAT  Place 1 tablet (0.4 mg total) under the tongue every 5 (five) minutes as needed for chest pain.     oxyCODONE-acetaminophen 5-325 MG tablet  Commonly known as:  PERCOCET  Take 1 tablet by mouth every 6 (six) hours as needed for severe pain.     pantoprazole 40 MG tablet  Commonly known as:  PROTONIX  Take 1 tablet (40 mg total) by mouth every morning.     VITAMIN D PO  Take 1 tablet by mouth daily.        Percocet #8 No Refill  Disposition: Home  Patient's condition: is Good  Follow up: 1. Mesenteric and renal duplex 12/07/15 2. Dr. Myra Gianotti in 4 weeks with CTA   Maris Berger, PA-C [facility] [phone] 12/06/2015  9:50 AM   - For VQI Registry use --- Instructions: Press F2 to tab through selections.  Delete question if not applicable.   Post-op:  Time to Extubation: [ x] In OR, [ ]  < 12 hrs, [ ]  12-24 hrs, [ ]  >=24 hrs Vasopressors Req. Post-op: No MI: No., [ ]  Troponin only, [ ]  EKG or Clinical New Arrhythmia: No CHF: No ICU Stay: 0 days Transfusion: No   Complications: Resp failure: No., [ ]  Pneumonia, [ ]  Ventilator Chg in renal function: No., [ ]  Inc. Cr > 0.5, [ ]  Temp. Dialysis, [ ]   Permanent dialysis Leg ischemia: No., no Surgery needed, [ ]  Yes, Surgery needed, [ ]  Amputation Bowel ischemia: No., [ ]  Medical Rx, [ ]  Surgical Rx Wound complication: No., [ ]  Superficial separation/infection, [ ]  Return to OR Return to OR: No  Return to OR for bleeding: No Stroke: No., [ ]  Minor, [ ]  Major  Discharge medications: Statin use:  Yes If No: [ ]  For Medical reasons, [ ]  Non-compliant, [ ]  Not-indicated ASA use:  Yes  If No: [ ]  For Medical  reasons, [ ]  Non-compliant, [ ]  Not-indicated Plavix use:  No If No: [ ]  For Medical reasons, [ ]  Non-compliant, [ ]  Not-indicated Beta blocker use:  Yes If No: [ ]  For Medical reasons, [ ]  Non-compliant, [ ]  Not-indicated

## 2015-12-07 ENCOUNTER — Telehealth: Payer: Self-pay | Admitting: Surgery

## 2015-12-07 ENCOUNTER — Encounter (HOSPITAL_COMMUNITY): Payer: BLUE CROSS/BLUE SHIELD

## 2015-12-07 NOTE — Telephone Encounter (Signed)
-----   Message from [employer], RVT sent at 12/07/2015  2:37 PM EDT ----- Regarding: RE: ASAP  This goes with other message. I will adjust my lunch hour from 1:00 - 2:00 pm so please do not schedule anything else for me.  I will speak with Boneta Lucks about not being able to schedule at [facility].  Thanks,  Olegario Messier ----- Message -----    From: Jena Gauss    Sent: 12/07/2015   1:38 PM      To: Caryl Pina A Comfort, RVT Subject: RE: ASAP  This goes with other message.        I've been trying to sch these appts at [facility]; however the one person who schedules their lab appts is on vacation this week and they tell me their supervisor will call me back to schedule but she has not.  The only place I can put them is in Rm1 on 6/30 at 11:00 so you can be with Janann August for her 9:00 renal and 10:00 mesenteric.  Thank you, Elon Jester  ----- Message -----    From: Carson Myrtle Comfort, RVT    Sent: 12/06/2015   8:51 AM      To: Jena Gauss Subject: RE: ASAP  This goes with other message.        I have a meeting scheduled for 9:00 am as well as 11:45-2:30 on the 28th.  Thanks, Olegario Messier ----- Message -----    From: Jena Gauss    Sent: 12/05/2015  10:16 AM      To: Caryl Pina A Comfort, RVT Subject: FW: ASAP  This goes with other message.        Hi Kathy,  [facility] wanted this pt to have a renal and mesenteric duplex this week. I sched them on 6/28 at 8:00. Is this ok?  Thank you, Elon Jester  ----- Message -----    From: Sharee Pimple, RN    Sent: 12/05/2015   9:56 AM      To: [facility] Admin Pool Subject: ASAP  This goes with other message.              ----- Message -----    From: Fransisco Hertz, MD    Sent: 12/03/2015   8:06 AM      To: [address]  Olivia Wade [id number] 10-02-1950  Dr. Myra Gianotti would like this recent Fenestrated [facility] pt to have a mesenteric and B renal duplex this week to check the patency of the SMA and B renal stents

## 2015-12-07 NOTE — Telephone Encounter (Signed)
Sched labs 12/09/15 at 11:00. Spoke to pt's daughter to inform them of appt.

## 2015-12-09 ENCOUNTER — Other Ambulatory Visit: Payer: Self-pay

## 2015-12-09 ENCOUNTER — Encounter (HOSPITAL_COMMUNITY): Payer: BLUE CROSS/BLUE SHIELD

## 2015-12-09 MED ORDER — PANTOPRAZOLE SODIUM 40 MG PO TBEC: 40.0000 mg | DELAYED_RELEASE_TABLET | Freq: Every morning | ORAL | Status: DC

## 2015-12-09 NOTE — Telephone Encounter (Signed)
Rx(s) sent to pharmacy electronically.  

## 2015-12-12 ENCOUNTER — Other Ambulatory Visit: Payer: Self-pay | Admitting: Cardiovascular Disease

## 2015-12-12 ENCOUNTER — Encounter (HOSPITAL_COMMUNITY): Payer: Self-pay | Admitting: Surgery

## 2015-12-12 MED ORDER — FOLIC ACID 1 MG PO TABS: 1.0000 mg | ORAL_TABLET | Freq: Every day | ORAL | Status: DC

## 2015-12-12 NOTE — Telephone Encounter (Signed)
Rx request sent to pharmacy.  

## 2015-12-14 ENCOUNTER — Encounter (HOSPITAL_COMMUNITY): Payer: BLUE CROSS/BLUE SHIELD

## 2015-12-22 ENCOUNTER — Telehealth: Payer: Self-pay | Admitting: Surgery

## 2015-12-22 ENCOUNTER — Ambulatory Visit (HOSPITAL_COMMUNITY)
Admission: RE | Admit: 2015-12-22 | Discharge: 2015-12-22 | Disposition: A | Discharge status: Home / Self Care | Payer: BLUE CROSS/BLUE SHIELD | Source: Ambulatory Visit | Attending: Surgery | Admitting: Surgery

## 2015-12-22 ENCOUNTER — Ambulatory Visit (INDEPENDENT_AMBULATORY_CARE_PROVIDER_SITE_OTHER)
Admission: RE | Admit: 2015-12-22 | Discharge: 2015-12-22 | Disposition: A | Discharge status: Home / Self Care | Payer: BLUE CROSS/BLUE SHIELD | Source: Ambulatory Visit | Attending: Surgery | Admitting: Surgery

## 2015-12-22 DIAGNOSIS — J449 Chronic obstructive pulmonary disease, unspecified: Secondary | ICD-10-CM | POA: Diagnosis not present

## 2015-12-22 DIAGNOSIS — Z48812 Encounter for surgical aftercare following surgery on the circulatory system: Secondary | ICD-10-CM

## 2015-12-22 DIAGNOSIS — I10 Essential (primary) hypertension: Secondary | ICD-10-CM | POA: Diagnosis not present

## 2015-12-22 DIAGNOSIS — E785 Hyperlipidemia, unspecified: Secondary | ICD-10-CM | POA: Diagnosis not present

## 2015-12-22 DIAGNOSIS — I2510 Atherosclerotic heart disease of native coronary artery without angina pectoris: Secondary | ICD-10-CM | POA: Insufficient documentation

## 2015-12-22 DIAGNOSIS — K219 Gastro-esophageal reflux disease without esophagitis: Secondary | ICD-10-CM | POA: Diagnosis not present

## 2015-12-22 DIAGNOSIS — Z720 Tobacco use: Secondary | ICD-10-CM | POA: Insufficient documentation

## 2015-12-22 DIAGNOSIS — I771 Stricture of artery: Secondary | ICD-10-CM | POA: Insufficient documentation

## 2015-12-22 DIAGNOSIS — F329 Major depressive disorder, single episode, unspecified: Secondary | ICD-10-CM | POA: Diagnosis not present

## 2015-12-22 DIAGNOSIS — F419 Anxiety disorder, unspecified: Secondary | ICD-10-CM | POA: Insufficient documentation

## 2015-12-22 DIAGNOSIS — K551 Chronic vascular disorders of intestine: Secondary | ICD-10-CM

## 2015-12-22 NOTE — Telephone Encounter (Signed)
Sched CTA 7/24 at [facility] at 10:30 and MD 7/31 at 9:30.

## 2015-12-22 NOTE — Telephone Encounter (Signed)
-----   Message from Sharee Pimple, RN sent at 12/02/2015  1:15 PM EDT ----- Regarding: schedule   ----- Message -----    From: Dara Lords, PA-C    Sent: 12/02/2015  12:40 PM      To: [facility] Charge Pool  S/p fenestrated AAA stent graft.  F/u with Dr. Myra Gianotti in 4 weeks with CTA protocol.  Thanks, Lelon Mast

## 2015-12-26 ENCOUNTER — Encounter: Payer: BLUE CROSS/BLUE SHIELD | Admitting: Surgery

## 2015-12-29 ENCOUNTER — Encounter: Payer: Self-pay | Admitting: Surgery

## 2016-01-02 ENCOUNTER — Ambulatory Visit
Admission: RE | Admit: 2016-01-02 | Discharge: 2016-01-02 | Disposition: A | Discharge status: Home / Self Care | Payer: BLUE CROSS/BLUE SHIELD | Source: Ambulatory Visit | Attending: Surgery | Admitting: Surgery

## 2016-01-02 DIAGNOSIS — I714 Abdominal aortic aneurysm, without rupture, unspecified: Secondary | ICD-10-CM

## 2016-01-02 DIAGNOSIS — Z95828 Presence of other vascular implants and grafts: Secondary | ICD-10-CM

## 2016-01-02 MED ORDER — IOPAMIDOL (ISOVUE-370) INJECTION 76%
75.0000 mL | Freq: Once | INTRAVENOUS | Status: AC | PRN
  Administered 2016-01-02: 75 mL via INTRAVENOUS

## 2016-01-09 ENCOUNTER — Ambulatory Visit (INDEPENDENT_AMBULATORY_CARE_PROVIDER_SITE_OTHER): Payer: Self-pay | Admitting: Surgery

## 2016-01-09 ENCOUNTER — Encounter: Payer: Self-pay | Admitting: Surgery

## 2016-01-09 VITALS — BP 118/72 | HR 56 | Temp 97.3°F | Resp 16 | Ht 64.0 in | Wt 147.0 lb

## 2016-01-09 DIAGNOSIS — I714 Abdominal aortic aneurysm, without rupture, unspecified: Secondary | ICD-10-CM

## 2016-01-09 NOTE — Progress Notes (Signed)
Patient name: Olivia Wade MRN: [id number] DOB: 1950-10-12 Sex: female  REASON FOR VISIT: Postop  HPI: ALECIA DOI is a 65 y.o. 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  She had stenting to bilateral renal arteries and a scallop around her superior mesenteric artery.  She does complain of weakness in her legs, but attributes this to her lower back issues.  Her abdominal pain has improved significantly.  She denies fevers or chills.  Current Outpatient Prescriptions  Medication Sig Dispense Refill  . acetaminophen (TYLENOL) 325 MG tablet Take 325 mg by mouth every 6 (six) hours as needed for moderate pain.    Marland Kitchen albuterol (PROVENTIL HFA;VENTOLIN HFA) 108 (90 BASE) MCG/ACT inhaler Inhale 1 puff into the lungs every 6 (six) hours as needed for wheezing or shortness of breath.    Marland Kitchen albuterol (PROVENTIL) (2.5 MG/3ML) 0.083% nebulizer solution Take 2.5 mg by nebulization every 6 (six) hours as needed for wheezing or shortness of breath.    . ALPRAZolam (XANAX) 0.5 MG tablet Take 0.5 mg by mouth 3 (three) times daily as needed for anxiety or sleep. *Patient is prescribed one tablets three times daily as needed for anxiety    . aspirin 81 MG tablet Take 81 mg by mouth daily.    Marland Kitchen atorvastatin (LIPITOR) 40 MG tablet Take 1 tablet (40 mg total) by mouth daily. 30 tablet 10  . Cholecalciferol (VITAMIN D PO) Take 1 tablet by mouth daily.    . citalopram (CELEXA) 20 MG tablet Take 20 mg by mouth daily.    . cyclobenzaprine (FLEXERIL) 10 MG tablet Take 1 tablet (10 mg total) by mouth 3 (three) times daily as needed for muscle spasms. 30 tablet 6  . folic acid (FOLVITE) 1 MG tablet Take 1 tablet (1 mg total) by mouth daily. 30 tablet 6  . iron polysaccharides (NIFEREX) 150 MG capsule Take 1 capsule (150 mg total) by mouth daily. 30 capsule 2  . isosorbide mononitrate (IMDUR)  30 MG 24 hr tablet Take 30 mg by mouth daily.    Marland Kitchen lisinopril (PRINIVIL,ZESTRIL) 5 MG tablet take 1 tablet by mouth once daily 30 tablet 10  . MELATONIN PO Take 1 tablet by mouth daily as needed (sleep).    . Menthol, Topical Analgesic, (ICY HOT BACK EX) Apply 1 application topically daily as needed (on back).    . metoprolol succinate (TOPROL-XL) 50 MG 24 hr tablet Take 1 tablet (50 mg total) by mouth 2 (two) times daily. 60 tablet 6  . montelukast (SINGULAIR) 10 MG tablet Take 10 mg by mouth at bedtime.    . nitroGLYCERIN (NITROSTAT) 0.4 MG SL tablet Place 1 tablet (0.4 mg total) under the tongue every 5 (five) minutes as needed for chest pain. 25 tablet 5  . Omega-3 Fatty Acids (FISH OIL EXTRA STRENGTH PO) Take by mouth.    . oxyCODONE-acetaminophen (PERCOCET) 5-325 MG tablet Take 1 tablet by mouth every 6 (six) hours as needed for severe pain. 8 tablet 0  . pantoprazole (PROTONIX) 40 MG tablet Take 1 tablet (40 mg total) by mouth every morning. 60 tablet 0   No current facility-administered medications for this visit.    Facility-Administered Medications Ordered in Other Visits  Medication Dose Route Frequency Provider Last Rate Last Dose  . 0.9 %  sodium chloride infusion   Intravenous Continuous Nada Libman, MD      . Chlorhexidine Gluconate Cloth 2 %  PADS 6 each  6 each Topical Once Nada Libman, MD        REVIEW OF SYSTEMS:  [X]  denotes positive finding, [ ]  denotes negative finding Cardiac  Comments:  Chest pain or chest pressure:    Shortness of breath upon exertion:    Short of breath when lying flat:    Irregular heart rhythm:    Constitutional    Fever or chills:      PHYSICAL EXAM: Vitals:   01/09/16 0926  BP: 118/72  Pulse: (!) 56  Resp: 16  Temp: 97.3 F (36.3 C)  TempSrc: Oral  SpO2: 98%  Weight: 147 lb (66.7 kg)  Height: 5\' 4"  (1.626 m)    GENERAL: The patient is a well-nourished female, in no acute distress. The vital signs are documented  above. CARDIOVASCULAR: There is a regular rate and rhythm. PULMONARY: There is good air exchange bilaterally without wheezing or rales. Palpable pedal pulses.   Abdomen is soft and nontender  MEDICAL ISSUES: I have reviewed her CT scan with the following findings: Normally patent fenestrated aortic endograft which appears well positioned. Visceral arteries are normally patent including the stented renal arteries bilaterally. Aneurysm dimensions are reduced compared to preoperative CTA when measuring diameters with and without the circumferential inflammatory changes. No evidence of endoleak. Inflammatory changes are similar to the previous CTA in June and show enhancement after administration of contrast.   Maximum diameter prior to intervention was 6.2 cm.  Today, maximum diameter 6.0 cm.  There is no evidence of endoleak.  I do not believe the patient's leg weakness has anything to do with her aneurysm repair.  From my perspective she is doing very well.  I will have her follow-up in 6 months with a repeat CT scan.  [name], MD [facility] [phone] Pager [phone]

## 2016-02-09 ENCOUNTER — Other Ambulatory Visit: Payer: Self-pay | Admitting: Cardiovascular Disease

## 2016-03-01 ENCOUNTER — Inpatient Hospital Stay (HOSPITAL_COMMUNITY)
Admission: EM | Admit: 2016-03-01 | Discharge: 2016-03-04 | DRG: 699 | Disposition: A | Discharge status: Home / Self Care | Payer: BLUE CROSS/BLUE SHIELD | Source: Other Acute Inpatient Hospital | Attending: Internal Medicine | Admitting: Internal Medicine

## 2016-03-01 ENCOUNTER — Encounter (HOSPITAL_COMMUNITY): Payer: Self-pay | Admitting: *Deleted

## 2016-03-01 ENCOUNTER — Observation Stay (HOSPITAL_BASED_OUTPATIENT_CLINIC_OR_DEPARTMENT_OTHER): Payer: BLUE CROSS/BLUE SHIELD

## 2016-03-01 DIAGNOSIS — E785 Hyperlipidemia, unspecified: Secondary | ICD-10-CM | POA: Diagnosis present

## 2016-03-01 DIAGNOSIS — Z90710 Acquired absence of both cervix and uterus: Secondary | ICD-10-CM

## 2016-03-01 DIAGNOSIS — N280 Ischemia and infarction of kidney: Principal | ICD-10-CM | POA: Diagnosis present

## 2016-03-01 DIAGNOSIS — Z91030 Bee allergy status: Secondary | ICD-10-CM

## 2016-03-01 DIAGNOSIS — J449 Chronic obstructive pulmonary disease, unspecified: Secondary | ICD-10-CM | POA: Diagnosis present

## 2016-03-01 DIAGNOSIS — Z8249 Family history of ischemic heart disease and other diseases of the circulatory system: Secondary | ICD-10-CM

## 2016-03-01 DIAGNOSIS — I771 Stricture of artery: Secondary | ICD-10-CM | POA: Diagnosis not present

## 2016-03-01 DIAGNOSIS — I701 Atherosclerosis of renal artery: Secondary | ICD-10-CM

## 2016-03-01 DIAGNOSIS — Z885 Allergy status to narcotic agent status: Secondary | ICD-10-CM

## 2016-03-01 DIAGNOSIS — D649 Anemia, unspecified: Secondary | ICD-10-CM | POA: Diagnosis present

## 2016-03-01 DIAGNOSIS — N390 Urinary tract infection, site not specified: Secondary | ICD-10-CM | POA: Diagnosis present

## 2016-03-01 DIAGNOSIS — Z79899 Other long term (current) drug therapy: Secondary | ICD-10-CM

## 2016-03-01 DIAGNOSIS — Z87891 Personal history of nicotine dependence: Secondary | ICD-10-CM

## 2016-03-01 DIAGNOSIS — Z955 Presence of coronary angioplasty implant and graft: Secondary | ICD-10-CM

## 2016-03-01 DIAGNOSIS — I252 Old myocardial infarction: Secondary | ICD-10-CM

## 2016-03-01 DIAGNOSIS — I10 Essential (primary) hypertension: Secondary | ICD-10-CM | POA: Diagnosis present

## 2016-03-01 DIAGNOSIS — N179 Acute kidney failure, unspecified: Secondary | ICD-10-CM | POA: Diagnosis present

## 2016-03-01 DIAGNOSIS — F410 Panic disorder [episodic paroxysmal anxiety] without agoraphobia: Secondary | ICD-10-CM | POA: Diagnosis present

## 2016-03-01 DIAGNOSIS — Z880 Allergy status to penicillin: Secondary | ICD-10-CM

## 2016-03-01 DIAGNOSIS — I2510 Atherosclerotic heart disease of native coronary artery without angina pectoris: Secondary | ICD-10-CM | POA: Diagnosis present

## 2016-03-01 DIAGNOSIS — Z95828 Presence of other vascular implants and grafts: Secondary | ICD-10-CM

## 2016-03-01 DIAGNOSIS — Z7982 Long term (current) use of aspirin: Secondary | ICD-10-CM

## 2016-03-01 DIAGNOSIS — K219 Gastro-esophageal reflux disease without esophagitis: Secondary | ICD-10-CM | POA: Diagnosis present

## 2016-03-01 DIAGNOSIS — K5900 Constipation, unspecified: Secondary | ICD-10-CM | POA: Diagnosis present

## 2016-03-01 DIAGNOSIS — Z8679 Personal history of other diseases of the circulatory system: Secondary | ICD-10-CM

## 2016-03-01 LAB — COMPREHENSIVE METABOLIC PANEL
ALBUMIN: 3.3 g/dL — AB (ref 3.5–5.0)
ALK PHOS: 93 U/L (ref 38–126)
ALT: 18 U/L (ref 14–54)
ANION GAP: 9 (ref 5–15)
AST: 33 U/L (ref 15–41)
BILIRUBIN TOTAL: 0.5 mg/dL (ref 0.3–1.2)
BUN: 6 mg/dL (ref 6–20)
CALCIUM: 9.1 mg/dL (ref 8.9–10.3)
CO2: 24 mmol/L (ref 22–32)
Chloride: 101 mmol/L (ref 101–111)
Creatinine, Ser: 1.22 mg/dL — ABNORMAL HIGH (ref 0.44–1.00)
GFR, EST AFRICAN AMERICAN: 53 mL/min — AB (ref >60)
GFR, EST NON AFRICAN AMERICAN: 46 mL/min — AB (ref >60)
GLUCOSE: 116 mg/dL — AB (ref 65–99)
Potassium: 4.1 mmol/L (ref 3.5–5.1)
Sodium: 134 mmol/L — ABNORMAL LOW (ref 135–145)
TOTAL PROTEIN: 7.5 g/dL (ref 6.5–8.1)

## 2016-03-01 LAB — URINALYSIS, ROUTINE W REFLEX MICROSCOPIC
BILIRUBIN URINE: NEGATIVE
Glucose, UA: NEGATIVE mg/dL
KETONES UR: NEGATIVE mg/dL
Leukocytes, UA: NEGATIVE
NITRITE: NEGATIVE
Protein, ur: NEGATIVE mg/dL
Specific Gravity, Urine: 1.014 (ref 1.005–1.030)
pH: 5.5 (ref 5.0–8.0)

## 2016-03-01 LAB — LACTIC ACID, PLASMA: LACTIC ACID, VENOUS: 1.2 mmol/L (ref 0.5–1.9)

## 2016-03-01 LAB — TYPE AND SCREEN
ABO/RH(D): B NEG
Antibody Screen: NEGATIVE

## 2016-03-01 LAB — CBC WITH DIFFERENTIAL/PLATELET
BASOS ABS: 0 10*3/uL (ref 0.0–0.1)
BASOS PCT: 0 %
Eosinophils Absolute: 0 10*3/uL (ref 0.0–0.7)
Eosinophils Relative: 0 %
HEMATOCRIT: 34.5 % — AB (ref 36.0–46.0)
Hemoglobin: 10.6 g/dL — ABNORMAL LOW (ref 12.0–15.0)
Lymphocytes Relative: 22 %
Lymphs Abs: 2.1 10*3/uL (ref 0.7–4.0)
MCH: 28.6 pg (ref 26.0–34.0)
MCHC: 30.7 g/dL (ref 30.0–36.0)
MCV: 93.2 fL (ref 78.0–100.0)
MONO ABS: 0.9 10*3/uL (ref 0.1–1.0)
Monocytes Relative: 9 %
NEUTROS ABS: 6.5 10*3/uL (ref 1.7–7.7)
Neutrophils Relative %: 69 %
PLATELETS: 288 10*3/uL (ref 150–400)
RBC: 3.7 MIL/uL — AB (ref 3.87–5.11)
RDW: 16.6 % — AB (ref 11.5–15.5)
WBC: 9.6 10*3/uL (ref 4.0–10.5)

## 2016-03-01 LAB — URINE MICROSCOPIC-ADD ON
Bacteria, UA: NONE SEEN
SQUAMOUS EPITHELIAL / LPF: NONE SEEN

## 2016-03-01 MED ORDER — ALBUTEROL SULFATE HFA 108 (90 BASE) MCG/ACT IN AERS: 1.0000 | INHALATION_SPRAY | Freq: Four times a day (QID) | RESPIRATORY_TRACT | Status: DC | PRN

## 2016-03-01 MED ORDER — HEPARIN (PORCINE) IN NACL 100-0.45 UNIT/ML-% IJ SOLN
1000.0000 [IU]/h | INTRAMUSCULAR | Status: DC
  Administered 2016-03-01: 1000 [IU]/h via INTRAVENOUS
  Filled 2016-03-01: qty 250

## 2016-03-01 MED ORDER — POLYSACCHARIDE IRON COMPLEX 150 MG PO CAPS
150.0000 mg | ORAL_CAPSULE | Freq: Every day | ORAL | Status: DC
  Administered 2016-03-01 – 2016-03-04 (×4): 150 mg via ORAL
  Filled 2016-03-01 (×4): qty 1

## 2016-03-01 MED ORDER — ALBUTEROL SULFATE (2.5 MG/3ML) 0.083% IN NEBU: 2.5000 mg | INHALATION_SOLUTION | Freq: Four times a day (QID) | RESPIRATORY_TRACT | Status: DC | PRN

## 2016-03-01 MED ORDER — ONDANSETRON HCL 4 MG PO TABS: 4.0000 mg | ORAL_TABLET | Freq: Four times a day (QID) | ORAL | Status: DC | PRN

## 2016-03-01 MED ORDER — SODIUM CHLORIDE 0.9 % IV SOLN
INTRAVENOUS | Status: AC
  Administered 2016-03-01: 02:00:00 via INTRAVENOUS

## 2016-03-01 MED ORDER — FENTANYL CITRATE (PF) 100 MCG/2ML IJ SOLN
50.0000 ug | INTRAMUSCULAR | Status: DC | PRN
  Administered 2016-03-01 (×2): 50 ug via INTRAVENOUS
  Filled 2016-03-01 (×2): qty 2

## 2016-03-01 MED ORDER — ATORVASTATIN CALCIUM 40 MG PO TABS
40.0000 mg | ORAL_TABLET | Freq: Every day | ORAL | Status: DC
  Administered 2016-03-01 – 2016-03-03 (×3): 40 mg via ORAL
  Filled 2016-03-01 (×3): qty 1

## 2016-03-01 MED ORDER — ALPRAZOLAM 0.5 MG PO TABS: 0.5000 mg | ORAL_TABLET | Freq: Three times a day (TID) | ORAL | Status: DC | PRN

## 2016-03-01 MED ORDER — PANTOPRAZOLE SODIUM 40 MG PO TBEC
40.0000 mg | DELAYED_RELEASE_TABLET | Freq: Every morning | ORAL | Status: DC
  Administered 2016-03-01 – 2016-03-04 (×4): 40 mg via ORAL
  Filled 2016-03-01 (×4): qty 1

## 2016-03-01 MED ORDER — METOPROLOL SUCCINATE ER 50 MG PO TB24
50.0000 mg | ORAL_TABLET | Freq: Two times a day (BID) | ORAL | Status: DC
  Administered 2016-03-01 – 2016-03-04 (×7): 50 mg via ORAL
  Filled 2016-03-01 (×8): qty 1

## 2016-03-01 MED ORDER — FOLIC ACID 1 MG PO TABS
1.0000 mg | ORAL_TABLET | Freq: Every day | ORAL | Status: DC
  Administered 2016-03-01 – 2016-03-04 (×4): 1 mg via ORAL
  Filled 2016-03-01 (×4): qty 1

## 2016-03-01 MED ORDER — NITROGLYCERIN 0.4 MG SL SUBL: 0.4000 mg | SUBLINGUAL_TABLET | SUBLINGUAL | Status: DC | PRN

## 2016-03-01 MED ORDER — OXYCODONE-ACETAMINOPHEN 5-325 MG PO TABS
1.0000 | ORAL_TABLET | Freq: Four times a day (QID) | ORAL | Status: DC | PRN
  Administered 2016-03-01 – 2016-03-03 (×8): 1 via ORAL
  Filled 2016-03-01 (×9): qty 1

## 2016-03-01 MED ORDER — CITALOPRAM HYDROBROMIDE 20 MG PO TABS
20.0000 mg | ORAL_TABLET | Freq: Every day | ORAL | Status: DC
  Administered 2016-03-01 – 2016-03-04 (×4): 20 mg via ORAL
  Filled 2016-03-01 (×4): qty 1

## 2016-03-01 MED ORDER — ISOSORBIDE MONONITRATE ER 30 MG PO TB24
30.0000 mg | ORAL_TABLET | Freq: Every day | ORAL | Status: DC
Start: 2016-03-01 — End: 2016-03-04
  Administered 2016-03-01 – 2016-03-04 (×4): 30 mg via ORAL
  Filled 2016-03-01 (×4): qty 1

## 2016-03-01 MED ORDER — ACETAMINOPHEN 650 MG RE SUPP: 650.0000 mg | Freq: Four times a day (QID) | RECTAL | Status: DC | PRN

## 2016-03-01 MED ORDER — ACETAMINOPHEN 325 MG PO TABS
650.0000 mg | ORAL_TABLET | Freq: Four times a day (QID) | ORAL | Status: DC | PRN
  Administered 2016-03-03 – 2016-03-04 (×3): 650 mg via ORAL
  Filled 2016-03-01 (×3): qty 2

## 2016-03-01 MED ORDER — CYCLOBENZAPRINE HCL 10 MG PO TABS: 10.0000 mg | ORAL_TABLET | Freq: Three times a day (TID) | ORAL | Status: DC | PRN

## 2016-03-01 MED ORDER — TICAGRELOR 60 MG PO TABS
60.0000 mg | ORAL_TABLET | Freq: Two times a day (BID) | ORAL | Status: DC
  Administered 2016-03-01 – 2016-03-04 (×7): 60 mg via ORAL
  Filled 2016-03-01 (×7): qty 1

## 2016-03-01 MED ORDER — CIPROFLOXACIN IN D5W 400 MG/200ML IV SOLN
400.0000 mg | Freq: Two times a day (BID) | INTRAVENOUS | Status: DC
  Administered 2016-03-01 – 2016-03-04 (×7): 400 mg via INTRAVENOUS
  Filled 2016-03-01 (×9): qty 200

## 2016-03-01 MED ORDER — ASPIRIN 81 MG PO CHEW
81.0000 mg | CHEWABLE_TABLET | Freq: Every day | ORAL | Status: DC
  Administered 2016-03-01 – 2016-03-04 (×4): 81 mg via ORAL
  Filled 2016-03-01 (×4): qty 1

## 2016-03-01 MED ORDER — MONTELUKAST SODIUM 10 MG PO TABS
10.0000 mg | ORAL_TABLET | Freq: Every day | ORAL | Status: DC
  Administered 2016-03-01 – 2016-03-03 (×3): 10 mg via ORAL
  Filled 2016-03-01 (×3): qty 1

## 2016-03-01 MED ORDER — HEPARIN BOLUS VIA INFUSION
3000.0000 [IU] | Freq: Once | INTRAVENOUS | Status: AC
  Administered 2016-03-01: 3000 [IU] via INTRAVENOUS
  Filled 2016-03-01: qty 3000

## 2016-03-01 MED ORDER — ONDANSETRON HCL 4 MG/2ML IJ SOLN
4.0000 mg | Freq: Four times a day (QID) | INTRAMUSCULAR | Status: DC | PRN
  Administered 2016-03-01: 4 mg via INTRAVENOUS
  Filled 2016-03-01: qty 2

## 2016-03-01 MED ORDER — OMEGA-3-ACID ETHYL ESTERS 1 G PO CAPS
1.0000 g | ORAL_CAPSULE | Freq: Every day | ORAL | Status: DC
Start: 2016-03-01 — End: 2016-03-04
  Administered 2016-03-01 – 2016-03-04 (×4): 1 g via ORAL
  Filled 2016-03-01 (×4): qty 1

## 2016-03-01 NOTE — Progress Notes (Signed)
Patient seen and examined, vital stable, still in pain , need prn pain meds, vascular surgery input appreciated.

## 2016-03-01 NOTE — Plan of Care (Signed)
Problem: Education: Goal: Knowledge of [facility] Education information/materials will improve Outcome: Progressing Patient oriented to room and surroundings.

## 2016-03-01 NOTE — Plan of Care (Signed)
64 year old female with a history of renal artery stenosis status post stent presents with a right-sided abdominal pain. CT scan as per ER physician was showing right renal infarct. ER physician had discussed with Dr. Darrick Penna on call vascular surgeon, who  advised symptomatic management since patient has been having symptoms for more than 24 hours. Vascular surgery will see patient in consultation once patient arrives. Please notify vascular surgery on arrival.  [place].

## 2016-03-01 NOTE — Progress Notes (Signed)
ANTICOAGULATION CONSULT NOTE - Initial Consult  Pharmacy Consult for Heparin Indication: renal artery occlusion  Allergies  Allergen Reactions  . Bee Venom Anaphylaxis, Hives and Swelling  . Hydrocodone Other (See Comments)  . Penicillins Other (See Comments)    PATIENT REPORTS "MOTHER TOLD HER SHE HAD ALLERGY TO PCN"  UNKNOWN REACTION TO PCN PCN reaction causing immediate rash, facial/tongue/throat swelling, SOB or lightheadedness with hypotension: unknown PCN reaction causing severe rash involving mucus membranes or skin necrosis: unknown PCN reaction that required hospitalization unknown PCN reaction occurring within the last 10 years: unknown  . Morphine And Related Rash    Patient Measurements: Height: 5\' 4"  (162.6 cm) Weight: 151 lb 6.4 oz (68.7 kg) IBW/kg (Calculated) : 54.7  Vital Signs: Temp: 98.3 F (36.8 C) (09/21 0459) Temp Source: Oral (09/21 0459) BP: 145/80 (09/21 0459) Pulse Rate: 83 (09/21 0459)  Labs:  Recent Labs  03/01/16 0208  HGB 10.6*  HCT 34.5*  PLT 288  CREATININE 1.22*    Estimated Creatinine Clearance: 44.3 mL/min (by C-G formula based on SCr of 1.22 mg/dL (H)).   Medical History: Past Medical History:  Diagnosis Date  . AAA (abdominal aortic aneurysm) (HCC)   . Anxiety    Panic attacks  . Arthritis   . Asthma   . Chronic low back pain    (Old vertebral fracture)  . Complication of anesthesia   . COPD (chronic obstructive pulmonary disease) (HCC)   . Coronary artery disease 09/08/13   with PCI  . Depression   . Family history of adverse reaction to anesthesia     mother - slow to awaken  . GERD (gastroesophageal reflux disease)   . Heart palpitations    prn toprol for rapid HR  . History of surgery on arm   . HLD (hyperlipidemia)   . HTN (hypertension)   . NSTEMI (non-ST elevated myocardial infarction) (HCC) 09/08/13  . PONV (postoperative nausea and vomiting)   . Tobacco abuse     Medications:  Prescriptions Prior to  Admission  Medication Sig Dispense Refill Last Dose  . acetaminophen (TYLENOL) 325 MG tablet Take 325 mg by mouth every 6 (six) hours as needed for moderate pain.   Past Month at Unknown time  . albuterol (PROVENTIL HFA;VENTOLIN HFA) 108 (90 BASE) MCG/ACT inhaler Inhale 1 puff into the lungs every 6 (six) hours as needed for wheezing or shortness of breath.   Past Week at Unknown time  . albuterol (PROVENTIL) (2.5 MG/3ML) 0.083% nebulizer solution Take 2.5 mg by nebulization every 6 (six) hours as needed for wheezing or shortness of breath.   Past Week at Unknown time  . ALPRAZolam (XANAX) 0.5 MG tablet Take 0.5 mg by mouth 3 (three) times daily as needed for anxiety or sleep. *Patient is prescribed one tablets three times daily as needed for anxiety   Past Week at Unknown time  . aspirin 81 MG tablet Take 81 mg by mouth daily.   02/29/2016 at Unknown time  . atorvastatin (LIPITOR) 40 MG tablet Take 1 tablet (40 mg total) by mouth daily. 30 tablet 10 02/28/2016 at Unknown time  . BRILINTA 60 MG TABS tablet Take 60 mg by mouth 2 (two) times daily.  1 02/29/2016 at 0800  . Cholecalciferol (VITAMIN D PO) Take 1 tablet by mouth daily.   02/29/2016 at Unknown time  . citalopram (CELEXA) 20 MG tablet Take 20 mg by mouth daily.   02/29/2016 at Unknown time  . cyclobenzaprine (FLEXERIL) 10 MG tablet  Take 1 tablet (10 mg total) by mouth 3 (three) times daily as needed for muscle spasms. 30 tablet 6 02/29/2016 at Unknown time  . folic acid (FOLVITE) 1 MG tablet Take 1 tablet (1 mg total) by mouth daily. 30 tablet 6 02/29/2016 at Unknown time  . iron polysaccharides (NIFEREX) 150 MG capsule Take 1 capsule (150 mg total) by mouth daily. 30 capsule 2 02/29/2016 at Unknown time  . isosorbide mononitrate (IMDUR) 30 MG 24 hr tablet Take 30 mg by mouth daily.   02/29/2016 at Unknown time  . lisinopril (PRINIVIL,ZESTRIL) 5 MG tablet take 1 tablet by mouth once daily 30 tablet 10 02/29/2016 at Unknown time  . MELATONIN PO Take 1  tablet by mouth daily as needed (sleep).   Past Week at Unknown time  . metoprolol succinate (TOPROL-XL) 50 MG 24 hr tablet Take 1 tablet (50 mg total) by mouth 2 (two) times daily. 60 tablet 6 02/29/2016 at Unknown time  . montelukast (SINGULAIR) 10 MG tablet Take 10 mg by mouth at bedtime.   02/29/2016 at Unknown time  . nitroGLYCERIN (NITROSTAT) 0.4 MG SL tablet Place 1 tablet (0.4 mg total) under the tongue every 5 (five) minutes as needed for chest pain. 25 tablet 5 Past Month at Unknown time  . Omega-3 Fatty Acids (FISH OIL EXTRA STRENGTH PO) Take by mouth.   02/29/2016 at Unknown time  . pantoprazole (PROTONIX) 40 MG tablet take 1 tablet by mouth every morning 30 tablet 3 02/29/2016 at Unknown time  . Menthol, Topical Analgesic, (ICY HOT BACK EX) Apply 1 application topically daily as needed (on back).   Unknown at Unknown time    Assessment: 65 y.o. female with possible renal artery occlusion for heparin  Goal of Therapy:  Heparin level 0.3-0.7 units/ml Monitor platelets by anticoagulation protocol: Yes   Plan:  Heparin 3000 units IV bolus, then start heparin 1000 units/hr Check heparin level in 8 hours.    Jourdyn Hasler, Gary FleetGregory Vernon 03/01/2016,7:51 AM

## 2016-03-01 NOTE — Progress Notes (Signed)
MD ordered DNR for this patient but writer asked patient if she understood that if she stops breathing that staff will not perform CPR? Patient declined the DNR band and said she did not understand it. Patient does not want to be on life support to keep her alive. MD notified. MD came up to talk to patient. Waiting for the DNR order to be revised.

## 2016-03-01 NOTE — Consult Note (Addendum)
Referring Physician: Jarrett SohoKakrkandy MD  Patient name: Olivia SousLinda S Berninger MRN: [id number] DOB: [birth year] Sex: female  REASON FOR CONSULT: right renal infarct  HPI: Olivia Wade is a 65 y.o. female, who at 3 am on 9/20 had sudden onset of right lower quadrant pain with radiation to the right flank now with radiation up to shoulders anteriorly.  She had a fenestrated graft performed by Dr Myra GianottiBrabham about 3 months ago.  Creatinine per report from [facility] normal.   Other medical problems include anxiety, COPD, CAD all of which are stable.  No nausea or vomiting.  Pain is about the same as yesterday at 3 am.  Past Medical History:  Diagnosis Date  . AAA (abdominal aortic aneurysm) (HCC)   . Anxiety    Panic attacks  . Arthritis   . Asthma   . Chronic low back pain    (Old vertebral fracture)  . Complication of anesthesia   . COPD (chronic obstructive pulmonary disease) (HCC)   . Coronary artery disease 09/08/13   with PCI  . Depression   . Family history of adverse reaction to anesthesia     mother - slow to awaken  . GERD (gastroesophageal reflux disease)   . Heart palpitations    prn toprol for rapid HR  . History of surgery on arm   . HLD (hyperlipidemia)   . HTN (hypertension)   . NSTEMI (non-ST elevated myocardial infarction) (HCC) 09/08/13  . PONV (postoperative nausea and vomiting)   . Tobacco abuse    Past Surgical History:  Procedure Laterality Date  . 2d echo  12/2012   normal LV with mild LVH, grade 1 diastolic dysfunction  . ABDOMINAL AORTIC ANEURYSM REPAIR  12/02/2015   ABDOMINAL AORTIC ENDOVASCULAR FENESTRATED STENT GRAFT (N/A)  . ABDOMINAL AORTIC DUPLEX  05/19/2012   WHEN COMPARED TO PRIOR STUDY DATED 06/01/11 THERE IS AN INCREASE IN SIZE OF DILATATION.  Marland Kitchen. ABDOMINAL AORTIC ENDOVASCULAR FENESTRATED STENT GRAFT N/A 12/02/2015   Procedure: ABDOMINAL AORTIC ENDOVASCULAR FENESTRATED STENT GRAFT;  Surgeon: Nada LibmanVance W Brabham, MD;  [facility];  Service: Vascular;  Laterality:  N/A;  . ABDOMINAL HYSTERECTOMY    . CHOLECYSTECTOMY    . CORONARY ANGIOPLASTY WITH STENT PLACEMENT  09/10/13   PCI of RCA 90% proximal RCA stenosis with DES  . CORONARY STENT PLACEMENT  09/08/13   PCI with cutting balloon, PTCA,DES to prox LAD  . CYST EXCISION Left    wrist  . DOPPLER ECHOCARDIOGRAPHY  12/31/2012  . LAPAROSCOPIC NISSEN FUNDOPLICATION     for severe reflux  . LEFT HEART CATHETERIZATION WITH CORONARY ANGIOGRAM N/A 09/08/2013   Procedure: LEFT HEART CATHETERIZATION WITH CORONARY ANGIOGRAM;  Surgeon: Lennette Biharihomas A Kelly, MD;  [facility];  Service: Cardiovascular;  Laterality: N/A;  . NUCLEAR STRESS TEST  05/09/2011   NORMAL PATTREN OF PERFUSION IN ALL REGIONS. LV IS NORMAL IS SIZE. EF 68% NO WALL MOTION ABNORMALITIES.  Marland Kitchen. PERCUTANEOUS CORONARY STENT INTERVENTION (PCI-S) N/A 09/10/2013   Procedure: PERCUTANEOUS CORONARY STENT INTERVENTION (PCI-S);  Surgeon: Lennette Biharihomas A Kelly, MD;  [facility];  Service: Cardiovascular;  Laterality: N/A;    Family History  Problem Relation Age of Onset  . Heart attack Father   . Cancer Father     Esophageal  . Heart disease Father     before age 64  . Hypertension Mother   . Heart attack Son [id number]  . Heart disease Son     before age 64  SOCIAL HISTORY: Social History   Social History  . Marital status: Married    Spouse name: N/A  . Number of children: N/A  . Years of education: N/A   Occupational History  . Not on file.   Social History Main Topics  . Smoking status: Former Smoker    Packs/day: 0.50    Years: 37.00    Types: Cigarettes    Quit date: 09/08/2013  . Smokeless tobacco: Never Used  . Alcohol use No  . Drug use: No  . Sexual activity: Not on file   Other Topics Concern  . Not on file   Social History Narrative  . No narrative on file    Allergies  Allergen Reactions  . Bee Venom Anaphylaxis, Hives and Swelling  . Hydrocodone Other (See Comments)  . Penicillins Other (See Comments)     PATIENT REPORTS "MOTHER TOLD HER SHE HAD ALLERGY TO PCN"  UNKNOWN REACTION TO PCN PCN reaction causing immediate rash, facial/tongue/throat swelling, SOB or lightheadedness with hypotension: unknown PCN reaction causing severe rash involving mucus membranes or skin necrosis: unknown PCN reaction that required hospitalization unknown PCN reaction occurring within the last 10 years: unknown  . Morphine And Related Rash    Current Facility-Administered Medications  Medication Dose Route Frequency Provider Last Rate Last Dose  . 0.9 %  sodium chloride infusion   Intravenous Continuous Eduard Clos, MD 75 mL/hr at 03/01/16 0217    . acetaminophen (TYLENOL) tablet 650 mg  650 mg Oral Q6H PRN Eduard Clos, MD       Or  . acetaminophen (TYLENOL) suppository 650 mg  650 mg Rectal Q6H PRN Eduard Clos, MD      . albuterol (PROVENTIL) (2.5 MG/3ML) 0.083% nebulizer solution 2.5 mg  2.5 mg Nebulization Q6H PRN Eduard Clos, MD      . ALPRAZolam Prudy Feeler) tablet 0.5 mg  0.5 mg Oral TID PRN Eduard Clos, MD      . aspirin chewable tablet 81 mg  81 mg Oral Daily Eduard Clos, MD      . atorvastatin (LIPITOR) tablet 40 mg  40 mg Oral q1800 Eduard Clos, MD      . ciprofloxacin (CIPRO) IVPB 400 mg  400 mg Intravenous Q12H Titus Mould, RPH   400 mg at 03/01/16 0442  . citalopram (CELEXA) tablet 20 mg  20 mg Oral Daily Eduard Clos, MD      . cyclobenzaprine (FLEXERIL) tablet 10 mg  10 mg Oral TID PRN Eduard Clos, MD      . fentaNYL (SUBLIMAZE) injection 50 mcg  50 mcg Intravenous Q2H PRN Eduard Clos, MD   50 mcg at 03/01/16 0458  . folic acid (FOLVITE) tablet 1 mg  1 mg Oral Daily Eduard Clos, MD      . iron polysaccharides (NIFEREX) capsule 150 mg  150 mg Oral Daily Eduard Clos, MD      . isosorbide mononitrate (IMDUR) 24 hr tablet 30 mg  30 mg Oral Daily Eduard Clos, MD      . metoprolol succinate (TOPROL-XL) 24  hr tablet 50 mg  50 mg Oral BID Eduard Clos, MD   50 mg at 03/01/16 0442  . montelukast (SINGULAIR) tablet 10 mg  10 mg Oral QHS Eduard Clos, MD      . nitroGLYCERIN (NITROSTAT) SL tablet 0.4 mg  0.4 mg Sublingual Q5 min PRN Eduard Clos, MD      .  omega-3 acid ethyl esters (LOVAZA) capsule 1 g  1 g Oral Daily Eduard Clos, MD      . ondansetron [facility]) tablet 4 mg  4 mg Oral Q6H PRN Eduard Clos, MD       Or  . ondansetron (ZOFRAN) injection 4 mg  4 mg Intravenous Q6H PRN Eduard Clos, MD      . oxyCODONE-acetaminophen (PERCOCET/ROXICET) 5-325 MG per tablet 1 tablet  1 tablet Oral Q6H PRN Eduard Clos, MD      . pantoprazole (PROTONIX) EC tablet 40 mg  40 mg Oral q morning - 10a Eduard Clos, MD      . ticagrelor [facility]) tablet 60 mg  60 mg Oral BID Eduard Clos, MD       Facility-Administered Medications Ordered in Other Encounters  Medication Dose Route Frequency Provider Last Rate Last Dose  . 0.9 %  sodium chloride infusion   Intravenous Continuous Nada Libman, MD      . Chlorhexidine Gluconate Cloth 2 % PADS 6 each  6 each Topical Once Nada Libman, MD        ROS:   General:  No weight loss, Fever, chills  HEENT: No recent headaches, no nasal bleeding, no visual changes, no sore throat  Neurologic: No dizziness, blackouts, seizures. No recent symptoms of stroke or mini- stroke. No recent episodes of slurred speech, or temporary blindness.  Cardiac: No recent episodes of chest pain/pressure, no shortness of breath at rest.  +shortness of breath with exertion.  Denies history of atrial fibrillation or irregular heartbeat  Vascular: No history of rest pain in feet.  No history of claudication.  No history of non-healing ulcer, No history of DVT   Pulmonary: No home oxygen, no productive cough, no hemoptysis,  No asthma or wheezing  Musculoskeletal:  [ ]  Arthritis, [ x] Low back pain,  [X]  Joint  pain  Hematologic:No history of hypercoagulable state.  No history of easy bleeding.  No history of anemia  Gastrointestinal: No hematochezia or melena,  No gastroesophageal reflux, no trouble swallowing  Urinary: [ ]  chronic Kidney disease, [ ]  on HD - [ ]  MWF or [ ]  TTHS, [ ]  Burning with urination, [ ]  Frequent urination, [ ]  Difficulty urinating;   Skin: No rashes  Psychological: No history of anxiety,  No history of depression   Physical Examination  Vitals:   03/01/16 0128 03/01/16 0459  BP: (!) 146/68 (!) 145/80  Pulse: 76 83  Resp: 18   Temp: 98.5 F (36.9 C) 98.3 F (36.8 C)  TempSrc: Oral Oral  SpO2: 94% 98%  Weight: 151 lb 6.4 oz (68.7 kg)   Height: 5\' 4"  (1.626 m)     Body mass index is 25.99 kg/m.  General:  Alert and oriented, no acute distress HEENT: Normal Neck: No bruit or JVD Pulmonary: Clear to auscultation bilaterally Cardiac: Regular Rate and Rhythm without murmur Abdomen: Soft, non-tender, non-distended, no mass, no scars Skin: No rash Extremity Pulses:  2+ radial, brachial, femoral, dorsalis pedis, posterior tibial pulses bilaterally Musculoskeletal: No deformity or edema  Neurologic: Upper and lower extremity motor 5/5 and symmetric  DATA:  CTA [facility] reviewed.  Images have very poor opacification of the arterial system.  Differential filling of right vs left kidney but hard to know if artery is actually occluded.  Otherwise stent graft looks good.  Left an right renal stent, aortic stent graft  CBC    Component Value Date/Time  WBC 9.6 03/01/2016 0208   RBC 3.70 (L) 03/01/2016 0208   HGB 10.6 (L) 03/01/2016 0208   HCT 34.5 (L) 03/01/2016 0208   PLT 288 03/01/2016 0208   MCV 93.2 03/01/2016 0208   MCH 28.6 03/01/2016 0208   MCHC 30.7 03/01/2016 0208   RDW 16.6 (H) 03/01/2016 0208   LYMPHSABS 2.1 03/01/2016 0208   MONOABS 0.9 03/01/2016 0208   EOSABS 0.0 03/01/2016 0208   BASOSABS 0.0 03/01/2016 0208    BMET    Component  Value Date/Time   NA 134 (L) 03/01/2016 0208   K 4.1 03/01/2016 0208   CL 101 03/01/2016 0208   CO2 24 03/01/2016 0208   GLUCOSE 116 (H) 03/01/2016 0208   BUN 6 03/01/2016 0208   CREATININE 1.22 (H) 03/01/2016 0208   CREATININE 0.83 08/18/2015 1213   CALCIUM 9.1 03/01/2016 0208   GFRNONAA 46 (L) 03/01/2016 0208   GFRAA 53 (L) 03/01/2016 0208     ASSESSMENT:  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  Most likely right kidney not salvageable.   PLAN:  Will get duplex of renal artery this morning to determine if occlusion or high grade stenosis.  If high grade stenosis may get angio later today.  I discussed the plan with the pt and Dr Myra Gianotti is aware and will follow up on the study later this morning.    Will put on heparin for now but will d/c if renal artery is occluded.   Fabienne Bruns, MD [facility] [place] Office: [phone] Pager: [phone]

## 2016-03-01 NOTE — Progress Notes (Signed)
Pharmacy Antibiotic Note  Olivia Wade is a 65 y.o. female admitted on 03/01/2016 with UTI.  Pharmacy has been consulted for Ciprofloxacin dosing.  Plan: Ciprofloxacin 400mg  IV q12h Will f/u micro data, renal function, and pt's clinical condition   Height: 5\' 4"  (162.6 cm) Weight: 151 lb 6.4 oz (68.7 kg) IBW/kg (Calculated) : 54.7  Temp (24hrs), Avg:98.5 F (36.9 C), Min:98.5 F (36.9 C), Max:98.5 F (36.9 C)   Recent Labs Lab 03/01/16 0208  WBC 9.6  CREATININE 1.22*  LATICACIDVEN 1.2    Estimated Creatinine Clearance: 44.3 mL/min (by C-G formula based on SCr of 1.22 mg/dL (H)).    Allergies  Allergen Reactions  . Bee Venom Anaphylaxis, Hives and Swelling  . Hydrocodone Other (See Comments)  . Penicillins Other (See Comments)    PATIENT REPORTS "MOTHER TOLD HER SHE HAD ALLERGY TO PCN"  UNKNOWN REACTION TO PCN PCN reaction causing immediate rash, facial/tongue/throat swelling, SOB or lightheadedness with hypotension: unknown PCN reaction causing severe rash involving mucus membranes or skin necrosis: unknown PCN reaction that required hospitalization unknown PCN reaction occurring within the last 10 years: unknown  . Morphine And Related Rash    Antimicrobials this admission: 9/21 Cipro >>   Microbiology results:  UCx:    Thank you for allowing pharmacy to be a part of this patient's care.  Christoper Fabian, PharmD, BCPS Clinical pharmacist, pager [phone] 03/01/2016 3:50 AM

## 2016-03-01 NOTE — Consult Note (Signed)
Duplex shows right renal artery is occluded with no flow to right kidney.  Relayed this result to Dr Myra Gianotti.  He will see pt later today. Will keep heparin going for now but most likely d/c later today.  Keep NPO for now but doubt intervention at this point with prolonged ischemia.  Call Dr Myra Gianotti if questions  Fabienne Bruns, MD [facility] [place] Office: [phone] Pager: [phone]

## 2016-03-01 NOTE — Progress Notes (Signed)
**  Preliminary report by tech**  Renal artery duplex completed. Technically limited study due to patient movement, pain tolerance, and acoustic shadowing. The right kidney measures 9.05 cm in length. There is minimal color fill with absent arcuate artery signals. Unable to obtain signals in the proximal, and mid portions of the renal artery. Low resistive waveforms detected in the distal portion of the right renal artery before entering the renal hilum. Distal renal artery velocity measures; PSV 121 cm/s EDV 64 cm/s.  03/01/16 9:52 AM Olen Cordial RVT

## 2016-03-01 NOTE — H&P (Signed)
History and Physical    Olivia Wade BMS:[id number] DOB: 01-Jul-1950 DOA: 03/01/2016  PCP: Ernestine Conrad, MD  Patient coming from: Patient was transferred from [facility].  Chief Complaint: Right flank pain.  HPI: Olivia Wade is a 65 y.o. female with history of abdominal aortic aneurysm status post endovascular grafting in June 2017, CAD status post stenting, hyperlipidemia, hypertension, chronic anemia started experiencing severe pain in the right flank since yesterday morning. Pain was associated with nausea and vomiting. CT abdomen and pelvis done in the ER at [facility] showed multifocal infarcts involving the right kidney with stenotic or occluded right renal artery likely at the level of the stent on this angiographic phase. On call vascular surgeon at [facility] Dr. Darrick Penna was contacted and Dr. Darrick Penna advised admission to [facility] for symptom management. Vascular surgery will be following as consult once contacted after arrival. On my exam patient is not in distress still has right flank pain. Denies any blood in the urine.   ED Course: Patient was a direct admit.  Review of Systems: As per HPI, rest all negative.   Past Medical History:  Diagnosis Date  . AAA (abdominal aortic aneurysm) (HCC)   . Anxiety    Panic attacks  . Arthritis   . Asthma   . Chronic low back pain    (Old vertebral fracture)  . Complication of anesthesia   . COPD (chronic obstructive pulmonary disease) (HCC)   . Coronary artery disease 09/08/13   with PCI  . Depression   . Family history of adverse reaction to anesthesia     mother - slow to awaken  . GERD (gastroesophageal reflux disease)   . Heart palpitations    prn toprol for rapid HR  . History of surgery on arm   . HLD (hyperlipidemia)   . HTN (hypertension)   . NSTEMI (non-ST elevated myocardial infarction) (HCC) 09/08/13  . PONV (postoperative nausea and vomiting)   . Tobacco abuse     Past Surgical History:    Procedure Laterality Date  . 2d echo  12/2012   normal LV with mild LVH, grade 1 diastolic dysfunction  . ABDOMINAL AORTIC ANEURYSM REPAIR  12/02/2015   ABDOMINAL AORTIC ENDOVASCULAR FENESTRATED STENT GRAFT (N/A)  . ABDOMINAL AORTIC DUPLEX  05/19/2012   WHEN COMPARED TO PRIOR STUDY DATED 06/01/11 THERE IS AN INCREASE IN SIZE OF DILATATION.  Marland Kitchen ABDOMINAL AORTIC ENDOVASCULAR FENESTRATED STENT GRAFT N/A 12/02/2015   Procedure: ABDOMINAL AORTIC ENDOVASCULAR FENESTRATED STENT GRAFT;  Surgeon: Nada Libman, MD;  Location: [facility] OR;  Service: Vascular;  Laterality: N/A;  . ABDOMINAL HYSTERECTOMY    . CHOLECYSTECTOMY    . CORONARY ANGIOPLASTY WITH STENT PLACEMENT  09/10/13   PCI of RCA 90% proximal RCA stenosis with DES  . CORONARY STENT PLACEMENT  09/08/13   PCI with cutting balloon, PTCA,DES to prox LAD  . CYST EXCISION Left    wrist  . DOPPLER ECHOCARDIOGRAPHY  12/31/2012  . LAPAROSCOPIC NISSEN FUNDOPLICATION     for severe reflux  . LEFT HEART CATHETERIZATION WITH CORONARY ANGIOGRAM N/A 09/08/2013   Procedure: LEFT HEART CATHETERIZATION WITH CORONARY ANGIOGRAM;  Surgeon: Lennette Bihari, MD;  Location: [facility] CATH LAB;  Service: Cardiovascular;  Laterality: N/A;  . NUCLEAR STRESS TEST  05/09/2011   NORMAL PATTREN OF PERFUSION IN ALL REGIONS. LV IS NORMAL IS SIZE. EF 68% NO WALL MOTION ABNORMALITIES.  Marland Kitchen PERCUTANEOUS CORONARY STENT INTERVENTION (PCI-S) N/A 09/10/2013   Procedure: PERCUTANEOUS CORONARY  STENT INTERVENTION (PCI-S);  Surgeon: Lennette Bihari, MD;  Location: [facility] CATH LAB;  Service: Cardiovascular;  Laterality: N/A;     reports that she quit smoking about 2 years ago. Her smoking use included Cigarettes. She has a 18.50 pack-year smoking history. She has never used smokeless tobacco. She reports that she does not drink alcohol or use drugs.  Allergies  Allergen Reactions  . Bee Venom Anaphylaxis, Hives and Swelling  . Hydrocodone Other (See Comments)  . Penicillins Other (See Comments)     PATIENT REPORTS "MOTHER TOLD HER SHE HAD ALLERGY TO PCN"  UNKNOWN REACTION TO PCN PCN reaction causing immediate rash, facial/tongue/throat swelling, SOB or lightheadedness with hypotension: unknown PCN reaction causing severe rash involving mucus membranes or skin necrosis: unknown PCN reaction that required hospitalization unknown PCN reaction occurring within the last 10 years: unknown  . Morphine And Related Rash    Family History  Problem Relation Age of Onset  . Heart attack Father   . Cancer Father     Esophageal  . Heart disease Father     before age 40  . Hypertension Mother   . Heart attack Son 52  . Heart disease Son     before age 55    Prior to Admission medications   Medication Sig Start Date End Date Taking? Authorizing Provider  Cholecalciferol (VITAMIN D PO) Take 1 tablet by mouth daily.   Yes Historical Provider, MD  cyclobenzaprine (FLEXERIL) 10 MG tablet Take 1 tablet (10 mg total) by mouth 3 (three) times daily as needed for muscle spasms.   Yes Lennette Bihari, MD  folic acid (FOLVITE) 1 MG tablet Take 1 tablet (1 mg total) by mouth daily. 12/12/15  Yes Lennette Bihari, MD  isosorbide mononitrate (IMDUR) 30 MG 24 hr tablet Take 30 mg by mouth daily.   Yes Historical Provider, MD  lisinopril (PRINIVIL,ZESTRIL) 5 MG tablet take 1 tablet by mouth once daily 05/04/15  Yes Lennette Bihari, MD  metoprolol succinate (TOPROL-XL) 50 MG 24 hr tablet Take 1 tablet (50 mg total) by mouth 2 (two) times daily. 11/02/15  Yes Laqueta Linden, MD  montelukast (SINGULAIR) 10 MG tablet Take 10 mg by mouth at bedtime.   Yes Historical Provider, MD  Omega-3 Fatty Acids (FISH OIL EXTRA STRENGTH PO) Take by mouth.   Yes Historical Provider, MD  pantoprazole (PROTONIX) 40 MG tablet take 1 tablet by mouth every morning 02/09/16  Yes Laqueta Linden, MD  acetaminophen (TYLENOL) 325 MG tablet Take 325 mg by mouth every 6 (six) hours as needed for moderate pain.    Historical Provider, MD   albuterol (PROVENTIL HFA;VENTOLIN HFA) 108 (90 BASE) MCG/ACT inhaler Inhale 1 puff into the lungs every 6 (six) hours as needed for wheezing or shortness of breath.    Historical Provider, MD  albuterol (PROVENTIL) (2.5 MG/3ML) 0.083% nebulizer solution Take 2.5 mg by nebulization every 6 (six) hours as needed for wheezing or shortness of breath.    Historical Provider, MD  ALPRAZolam Prudy Feeler) 0.5 MG tablet Take 0.5 mg by mouth 3 (three) times daily as needed for anxiety or sleep. *Patient is prescribed one tablets three times daily as needed for anxiety    Historical Provider, MD  aspirin 81 MG tablet Take 81 mg by mouth daily.    Historical Provider, MD  atorvastatin (LIPITOR) 40 MG tablet Take 1 tablet (40 mg total) by mouth daily. 11/11/15   Lennette Bihari, MD  citalopram (CELEXA)  20 MG tablet Take 20 mg by mouth daily.    Historical Provider, MD  iron polysaccharides (NIFEREX) 150 MG capsule Take 1 capsule (150 mg total) by mouth daily. 09/12/15   Lennette Bihari, MD  MELATONIN PO Take 1 tablet by mouth daily as needed (sleep).    Historical Provider, MD  Menthol, Topical Analgesic, (ICY HOT BACK EX) Apply 1 application topically daily as needed (on back).    Historical Provider, MD  nitroGLYCERIN (NITROSTAT) 0.4 MG SL tablet Place 1 tablet (0.4 mg total) under the tongue every 5 (five) minutes as needed for chest pain. 08/15/15   Lennette Bihari, MD  oxyCODONE-acetaminophen (PERCOCET) 5-325 MG tablet Take 1 tablet by mouth every 6 (six) hours as needed for severe pain. 12/02/15   Dara Lords, PA-C    Physical Exam: Vitals:   03/01/16 0128  BP: (!) 146/68  Pulse: 76  Resp: 18  Temp: 98.5 F (36.9 C)  TempSrc: Oral  SpO2: 94%  Weight: 151 lb 6.4 oz (68.7 kg)  Height: 5\' 4"  (1.626 m)      Constitutional: Not in distress. Vitals:   03/01/16 0128  BP: (!) 146/68  Pulse: 76  Resp: 18  Temp: 98.5 F (36.9 C)  TempSrc: Oral  SpO2: 94%  Weight: 151 lb 6.4 oz (68.7 kg)  Height: 5\' 4"   (1.626 m)   Eyes: Anicteric. No pallor. ENMT: No discharge from the ears eyes nose or mouth. Neck: No mass felt. No JVD appreciated. Respiratory: No rhonchi or crepitations. Cardiovascular: S1-S2 heard. Abdomen: Mild right flank tenderness. Musculoskeletal: No edema. Skin: No rash. Neurologic: Alert awake oriented to time place and person. Moves all extremities. Psychiatric: Appears normal.   Labs on Admission: I have personally reviewed following labs and imaging studies  CBC:  Recent Labs Lab 03/01/16 0208  WBC 9.6  NEUTROABS 6.5  HGB 10.6*  HCT 34.5*  MCV 93.2  PLT 288   Basic Metabolic Panel:  Recent Labs Lab 03/01/16 0208  NA 134*  K 4.1  CL 101  CO2 24  GLUCOSE 116*  BUN 6  CREATININE 1.22*  CALCIUM 9.1   GFR: Estimated Creatinine Clearance: 44.3 mL/min (by C-G formula based on SCr of 1.22 mg/dL (H)). Liver Function Tests:  Recent Labs Lab 03/01/16 0208  AST 33  ALT 18  ALKPHOS 93  BILITOT 0.5  PROT 7.5  ALBUMIN 3.3*   No results for input(s): LIPASE, AMYLASE in the last 168 hours. No results for input(s): AMMONIA in the last 168 hours. Coagulation Profile: No results for input(s): INR, PROTIME in the last 168 hours. Cardiac Enzymes: No results for input(s): CKTOTAL, CKMB, CKMBINDEX, TROPONINI in the last 168 hours. BNP (last 3 results) No results for input(s): PROBNP in the last 8760 hours. HbA1C: No results for input(s): HGBA1C in the last 72 hours. CBG: No results for input(s): GLUCAP in the last 168 hours. Lipid Profile: No results for input(s): CHOL, HDL, LDLCALC, TRIG, CHOLHDL, LDLDIRECT in the last 72 hours. Thyroid Function Tests: No results for input(s): TSH, T4TOTAL, FREET4, T3FREE, THYROIDAB in the last 72 hours. Anemia Panel: No results for input(s): VITAMINB12, FOLATE, FERRITIN, TIBC, IRON, RETICCTPCT in the last 72 hours. Urine analysis:    Component Value Date/Time   COLORURINE YELLOW 11/21/2015 0125   APPEARANCEUR  CLEAR 11/21/2015 0125   LABSPEC <1.005 (L) 11/21/2015 0125   PHURINE 5.5 11/21/2015 0125   GLUCOSEU NEGATIVE 11/21/2015 0125   HGBUR TRACE (A) 11/21/2015 0125   BILIRUBINUR  NEGATIVE 11/21/2015 0125   KETONESUR NEGATIVE 11/21/2015 0125   PROTEINUR NEGATIVE 11/21/2015 0125   NITRITE NEGATIVE 11/21/2015 0125   LEUKOCYTESUR SMALL (A) 11/21/2015 0125   Sepsis Labs: @LABRCNTIP (procalcitonin:4,lacticidven:4) )No results found for this or any previous visit (from the past 240 hour(s)).   Radiological Exams on Admission: No results found.  EKG: Independently reviewed. Normal sinus rhythm. [facility].  Assessment/Plan Principal Problem:   Renal infarct [facility](HCC) Active Problems:   HTN (hypertension)   Hyperlipidemia   CAD (coronary artery disease), recent DES stents to RCA and LAD in 3/15 and 4/15   S/P AAA repair using bifurcation graft   Normochromic normocytic anemia    1. Right-sided renal infarct - on call vascular surgeon Dr. Darrick PennaFields was notified [facility]. Dr. Darrick PennaFields has advised symptomatic management. We will continue with IV fluids and pain relieving medications. Consult vascular surgery in a.m. 2. History of CAD status post stenting - continue BRILINTA, statins and Imdur. 3. Possible UTI - patient is empirically placed on Cipro. Follow urine cultures. 4. Chronic anemia - follow CBC. 5. Hypertension - since patient's creatinine is mildly elevated I'm holding off patient's lisinopril for now. When necessary IV hydralazine for systolic blood pressure more than 160. 6. Abdominal aortic aneurysm status post endovascular repair in June 2017.   DVT prophylaxis: SCDs. Code Status: DO NOT INTUBATE.  Family Communication: Family at the bedside.  Disposition Plan: Home.  Consults called: None.  Admission status: Observation.    Eduard ClosKAKRAKANDY,Lilliana Turner N. MD [facility] Pager [phone].  If 7PM-7AM, please contact  night-coverage [email or web address] Password [email or web address]  03/01/2016, 3:41 AM

## 2016-03-01 NOTE — Progress Notes (Signed)
Dr. Darrick Penna paged. Called back. Will see patient in six hours.

## 2016-03-02 ENCOUNTER — Other Ambulatory Visit (HOSPITAL_COMMUNITY): Payer: BLUE CROSS/BLUE SHIELD

## 2016-03-02 DIAGNOSIS — I701 Atherosclerosis of renal artery: Secondary | ICD-10-CM | POA: Diagnosis not present

## 2016-03-02 DIAGNOSIS — N280 Ischemia and infarction of kidney: Secondary | ICD-10-CM | POA: Diagnosis not present

## 2016-03-02 LAB — BASIC METABOLIC PANEL
ANION GAP: 7 (ref 5–15)
BUN: 9 mg/dL (ref 6–20)
CALCIUM: 8.8 mg/dL — AB (ref 8.9–10.3)
CO2: 24 mmol/L (ref 22–32)
Chloride: 101 mmol/L (ref 101–111)
Creatinine, Ser: 1.3 mg/dL — ABNORMAL HIGH (ref 0.44–1.00)
GFR, EST AFRICAN AMERICAN: 49 mL/min — AB (ref >60)
GFR, EST NON AFRICAN AMERICAN: 42 mL/min — AB (ref >60)
GLUCOSE: 112 mg/dL — AB (ref 65–99)
POTASSIUM: 4 mmol/L (ref 3.5–5.1)
Sodium: 132 mmol/L — ABNORMAL LOW (ref 135–145)

## 2016-03-02 LAB — CBC
HCT: 30.3 % — ABNORMAL LOW (ref 36.0–46.0)
Hemoglobin: 9.6 g/dL — ABNORMAL LOW (ref 12.0–15.0)
MCH: 28.9 pg (ref 26.0–34.0)
MCHC: 31.7 g/dL (ref 30.0–36.0)
MCV: 91.3 fL (ref 78.0–100.0)
PLATELETS: 272 10*3/uL (ref 150–400)
RBC: 3.32 MIL/uL — AB (ref 3.87–5.11)
RDW: 16.7 % — AB (ref 11.5–15.5)
WBC: 13.6 10*3/uL — AB (ref 4.0–10.5)

## 2016-03-02 MED ORDER — POLYETHYLENE GLYCOL 3350 17 G PO PACK
17.0000 g | PACK | Freq: Every day | ORAL | Status: DC
  Administered 2016-03-03: 17 g via ORAL
  Filled 2016-03-02 (×4): qty 1

## 2016-03-02 MED ORDER — SENNOSIDES-DOCUSATE SODIUM 8.6-50 MG PO TABS
1.0000 | ORAL_TABLET | Freq: Two times a day (BID) | ORAL | Status: DC
  Administered 2016-03-02 – 2016-03-03 (×3): 1 via ORAL
  Filled 2016-03-02 (×5): qty 1

## 2016-03-02 NOTE — Progress Notes (Signed)
PROGRESS NOTE  Olivia Wade NFA:[id number] DOB: 05-Apr-1951 DOA: 03/01/2016 PCP: Ernestine ConradBLUTH, KIRK, MD  HPI/Recap of past 24 hours:  less flank pain, cr worsened, at 1.3 today, wbc 13.6 Report being constipated  Assessment/Plan: Principal Problem:   Renal infarct [facility](HCC) Active Problems:   HTN (hypertension)   Hyperlipidemia   CAD (coronary artery disease), recent DES stents to RCA and LAD in 3/15 and 4/15   S/P AAA repair using bifurcation graft   Normochromic normocytic anemia  1. 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  vascular surgery input appreciated. 2. History of CAD status post stenting - continue asa, BRILINTA, toprol-xl,statins and Imdur. 3. Possible UTI - patient is empirically placed on Cipro. Follow urine cultures. 4. Chronic anemia - follow CBC. 5. Hypertension -  lisinopril held due to elevated cr, she is continued on betablocker and imdur. When necessary IV hydralazine for systolic blood pressure more than 160. 6. Abdominal aortic aneurysm status post endovascular repair in June 2017.   DVT prophylaxis: SCDs. Code Status: DO NOT INTUBATE.  Family Communication: patient  Disposition Plan: Home in 1-2 days with vascular surgery clearance    Consultants:  Vascular surgery  Procedures:  Renal  artery duplex on 9/21 showed occluded right renal artery  Antibiotics:  cipro   Objective: BP (!) 141/75   Pulse 88   Temp 98.3 F (36.8 C) (Oral)   Resp 18   Ht 5\' 4"  (1.626 m)   Wt 68.7 kg (151 lb 6.4 oz)   SpO2 95%   BMI 25.99 kg/m   Intake/Output Summary (Last 24 hours) at 03/02/16 1200 Last data filed at 03/02/16 0530  Gross per 24 hour  Intake              760 ml  Output             2350 ml  Net            -1590 ml   Filed Weights   03/01/16 0128  Weight: 68.7 kg (151 lb 6.4 oz)    Exam:   General:  NAD  Cardiovascular: RRR  Respiratory: CTABL  Abdomen: Soft/ND/NT, positive  BS  Musculoskeletal: No Edema  Neuro: aaox3  Data Reviewed: Basic Metabolic Panel:  Recent Labs Lab 03/01/16 0208 03/02/16 0352  NA 134* 132*  K 4.1 4.0  CL 101 101  CO2 24 24  GLUCOSE 116* 112*  BUN 6 9  CREATININE 1.22* 1.30*  CALCIUM 9.1 8.8*   Liver Function Tests:  Recent Labs Lab 03/01/16 0208  AST 33  ALT 18  ALKPHOS 93  BILITOT 0.5  PROT 7.5  ALBUMIN 3.3*   No results for input(s): LIPASE, AMYLASE in the last 168 hours. No results for input(s): AMMONIA in the last 168 hours. CBC:  Recent Labs Lab 03/01/16 0208 03/02/16 0352  WBC 9.6 13.6*  NEUTROABS 6.5  --   HGB 10.6* 9.6*  HCT 34.5* 30.3*  MCV 93.2 91.3  PLT 288 272   Cardiac Enzymes:   No results for input(s): CKTOTAL, CKMB, CKMBINDEX, TROPONINI in the last 168 hours. BNP (last 3 results) No results for input(s): BNP in the last 8760 hours.  ProBNP (last 3 results) No results for input(s): PROBNP in the last 8760 hours.  CBG: No results for input(s): GLUCAP in the last 168 hours.  Recent Results (from the past 240 hour(s))  Culture, Urine     Status: Abnormal (Preliminary result)   Collection Time:  03/01/16  4:56 AM  Result Value Ref Range Status   Specimen Description URINE, RANDOM  Final   Special Requests NONE  Final   Culture 30,000 COLONIES/mL ENTEROCOCCUS FAECALIS (A)  Final   Report Status PENDING  Incomplete     Studies: No results found.  Scheduled Meds: . aspirin  81 mg Oral Daily  . atorvastatin  40 mg Oral q1800  . ciprofloxacin  400 mg Intravenous Q12H  . citalopram  20 mg Oral Daily  . folic acid  1 mg Oral Daily  . iron polysaccharides  150 mg Oral Daily  . isosorbide mononitrate  30 mg Oral Daily  . metoprolol succinate  50 mg Oral BID  . montelukast  10 mg Oral QHS  . omega-3 acid ethyl esters  1 g Oral Daily  . pantoprazole  40 mg Oral q morning - 10a  . ticagrelor  60 mg Oral BID    Continuous Infusions:    Time spent:  Michille Mcelrath MD,  PhD  [facility] Pager [phone]. If 7PM-7AM, please contact night-coverage at [email or web address], password [email or web address] 03/02/2016, 12:00 PM  LOS: 0 days

## 2016-03-02 NOTE — Plan of Care (Signed)
Problem: Education: Goal: Knowledge of [place] [facility] Education information/materials will improve Outcome: Progressing POC reviewed with pt.   

## 2016-03-02 NOTE — Progress Notes (Signed)
  [facility] Progress Note  Subjective    Right flank pain and RLQ pain tolerable. Improved from yesterday. Asking about going home.   Objective Vitals:   03/02/16 0530 03/02/16 0915  BP: 136/84 (!) 141/75  Pulse: 78 88  Resp: 18   Temp: 98.3 F (36.8 C)     Intake/Output Summary (Last 24 hours) at 03/02/16 1127 Last data filed at 03/02/16 0530  Gross per 24 hour  Intake              760 ml  Output             2350 ml  Net            -1590 ml   Mild pain to palpation right flank and RLQ Palpable DP pulses bilaterally  Assessment/Planning: 65 y.o. female is s/p fenestrated abdominal aortic graft 12/02/15 with right renal infarct.   Right renal artery is occluded per duplex. Patient tolerating right flank pain. Creatinine trending up from 1.2 to 1.3 today. Good UOP. On cipro for UTI. BP is ok. Will ask Dr. Myra Gianotti about d/c.    Ranae Plumber Trinh 03/02/2016 11:27 AM --  Laboratory CBC    Component Value Date/Time   WBC 13.6 (H) 03/02/2016 0352   HGB 9.6 (L) 03/02/2016 0352   HCT 30.3 (L) 03/02/2016 0352   PLT 272 03/02/2016 0352    BMET    Component Value Date/Time   NA 132 (L) 03/02/2016 0352   K 4.0 03/02/2016 0352   CL 101 03/02/2016 0352   CO2 24 03/02/2016 0352   GLUCOSE 112 (H) 03/02/2016 0352   BUN 9 03/02/2016 0352   CREATININE 1.30 (H) 03/02/2016 0352   CREATININE 0.83 08/18/2015 1213   CALCIUM 8.8 (L) 03/02/2016 0352   GFRNONAA 42 (L) 03/02/2016 0352   GFRAA 49 (L) 03/02/2016 0352    COAG Lab Results  Component Value Date   INR 1.28 12/02/2015   INR 1.06 11/20/2015   INR 1.11 04/04/2015   No results found for: PTT  Antibiotics Anti-infectives    Start     Dose/Rate Route Frequency Ordered Stop   03/01/16 0500  ciprofloxacin (CIPRO) IVPB 400 mg     400 mg 200 mL/hr over 60 Minutes Intravenous Every 12 hours 03/01/16 0352         Maris Berger, PA-C [facility] Office: [phone] Pager:  [phone] 03/02/2016 11:27 AM    I agree with the above.  I have seen and examined the patient. Her abdominal pain has improved Her creatinine has increased slightly.  I have recommended staying in the hospital until it starts to trend down. She will keep her previously scheduled appointment with me in several months. We discussed monitoring her blood pressure.  If her BP has a sudden increase, we would have to consider right nephrectomy at a later date.   [name]

## 2016-03-03 ENCOUNTER — Observation Stay (HOSPITAL_COMMUNITY): Payer: BLUE CROSS/BLUE SHIELD

## 2016-03-03 DIAGNOSIS — I370 Nonrheumatic pulmonary valve stenosis: Secondary | ICD-10-CM | POA: Diagnosis not present

## 2016-03-03 DIAGNOSIS — D649 Anemia, unspecified: Secondary | ICD-10-CM | POA: Diagnosis present

## 2016-03-03 DIAGNOSIS — Z8679 Personal history of other diseases of the circulatory system: Secondary | ICD-10-CM | POA: Diagnosis not present

## 2016-03-03 DIAGNOSIS — Z7982 Long term (current) use of aspirin: Secondary | ICD-10-CM | POA: Diagnosis not present

## 2016-03-03 DIAGNOSIS — N179 Acute kidney failure, unspecified: Secondary | ICD-10-CM | POA: Diagnosis present

## 2016-03-03 DIAGNOSIS — N280 Ischemia and infarction of kidney: Secondary | ICD-10-CM | POA: Diagnosis present

## 2016-03-03 DIAGNOSIS — K219 Gastro-esophageal reflux disease without esophagitis: Secondary | ICD-10-CM | POA: Diagnosis present

## 2016-03-03 DIAGNOSIS — R109 Unspecified abdominal pain: Secondary | ICD-10-CM | POA: Diagnosis present

## 2016-03-03 DIAGNOSIS — E785 Hyperlipidemia, unspecified: Secondary | ICD-10-CM | POA: Diagnosis present

## 2016-03-03 DIAGNOSIS — I252 Old myocardial infarction: Secondary | ICD-10-CM | POA: Diagnosis not present

## 2016-03-03 DIAGNOSIS — I10 Essential (primary) hypertension: Secondary | ICD-10-CM | POA: Diagnosis present

## 2016-03-03 DIAGNOSIS — Z885 Allergy status to narcotic agent status: Secondary | ICD-10-CM | POA: Diagnosis not present

## 2016-03-03 DIAGNOSIS — Z955 Presence of coronary angioplasty implant and graft: Secondary | ICD-10-CM | POA: Diagnosis not present

## 2016-03-03 DIAGNOSIS — Z95828 Presence of other vascular implants and grafts: Secondary | ICD-10-CM | POA: Diagnosis not present

## 2016-03-03 DIAGNOSIS — N390 Urinary tract infection, site not specified: Secondary | ICD-10-CM | POA: Diagnosis present

## 2016-03-03 DIAGNOSIS — I2510 Atherosclerotic heart disease of native coronary artery without angina pectoris: Secondary | ICD-10-CM | POA: Diagnosis present

## 2016-03-03 DIAGNOSIS — F410 Panic disorder [episodic paroxysmal anxiety] without agoraphobia: Secondary | ICD-10-CM | POA: Diagnosis present

## 2016-03-03 DIAGNOSIS — Z8249 Family history of ischemic heart disease and other diseases of the circulatory system: Secondary | ICD-10-CM | POA: Diagnosis not present

## 2016-03-03 DIAGNOSIS — J449 Chronic obstructive pulmonary disease, unspecified: Secondary | ICD-10-CM | POA: Diagnosis present

## 2016-03-03 DIAGNOSIS — Z880 Allergy status to penicillin: Secondary | ICD-10-CM | POA: Diagnosis not present

## 2016-03-03 DIAGNOSIS — K5900 Constipation, unspecified: Secondary | ICD-10-CM | POA: Diagnosis present

## 2016-03-03 DIAGNOSIS — Z90710 Acquired absence of both cervix and uterus: Secondary | ICD-10-CM | POA: Diagnosis not present

## 2016-03-03 DIAGNOSIS — Z87891 Personal history of nicotine dependence: Secondary | ICD-10-CM | POA: Diagnosis not present

## 2016-03-03 DIAGNOSIS — Z91030 Bee allergy status: Secondary | ICD-10-CM | POA: Diagnosis not present

## 2016-03-03 DIAGNOSIS — Z79899 Other long term (current) drug therapy: Secondary | ICD-10-CM | POA: Diagnosis not present

## 2016-03-03 LAB — URINE CULTURE: Culture: 30000 — AB

## 2016-03-03 LAB — BASIC METABOLIC PANEL
ANION GAP: 11 (ref 5–15)
BUN: 9 mg/dL (ref 6–20)
CHLORIDE: 101 mmol/L (ref 101–111)
CO2: 23 mmol/L (ref 22–32)
CREATININE: 1.32 mg/dL — AB (ref 0.44–1.00)
Calcium: 9.1 mg/dL (ref 8.9–10.3)
GFR calc non Af Amer: 42 mL/min — ABNORMAL LOW (ref >60)
GFR, EST AFRICAN AMERICAN: 48 mL/min — AB (ref >60)
Glucose, Bld: 100 mg/dL — ABNORMAL HIGH (ref 65–99)
POTASSIUM: 3.6 mmol/L (ref 3.5–5.1)
SODIUM: 135 mmol/L (ref 135–145)

## 2016-03-03 LAB — CBC
HEMATOCRIT: 30.2 % — AB (ref 36.0–46.0)
HEMOGLOBIN: 9.4 g/dL — AB (ref 12.0–15.0)
MCH: 28.7 pg (ref 26.0–34.0)
MCHC: 31.1 g/dL (ref 30.0–36.0)
MCV: 92.4 fL (ref 78.0–100.0)
Platelets: 261 10*3/uL (ref 150–400)
RBC: 3.27 MIL/uL — AB (ref 3.87–5.11)
RDW: 16.6 % — ABNORMAL HIGH (ref 11.5–15.5)
WBC: 12.1 10*3/uL — AB (ref 4.0–10.5)

## 2016-03-03 NOTE — Progress Notes (Signed)
  Echocardiogram 2D Echocardiogram has been performed.  Olivia Wade 03/03/2016, 4:32 PM

## 2016-03-03 NOTE — Progress Notes (Signed)
PROGRESS NOTE  Olivia SousLinda S Wade [id number] DOB: 09-Nov-1950 DOA: 03/01/2016 PCP: Ernestine ConradBLUTH, KIRK, MD  HPI/Recap of past 24 hours:  pain free today, ambulating in hallway,  cr  Seems has plateued, 1.3 -1.32  wbc 13.6-12 Report being constipated but refused stool softener  Assessment/Plan: Principal Problem:   Renal infarct [facility](HCC) Active Problems:   HTN (hypertension)   Hyperlipidemia   CAD (coronary artery disease), recent DES stents to RCA and LAD in 3/15 and 4/15   S/P AAA repair using bifurcation graft   Normochromic normocytic anemia  1. 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  vascular surgery input appreciated. 2. History of CAD status post stenting - continue asa, BRILINTA, toprol-xl,statins and Imdur. 3. Possible UTI - patient is empirically placed on Cipro. urine culture + 30.000 enterococcus faecalis, sensitive to levaquin 4. Chronic anemia - follow CBC. hgb stable 5. Hypertension -  lisinopril held due to elevated cr, she is continued on betablocker and imdur. When necessary IV hydralazine for systolic blood pressure more than 160. 6. Abdominal aortic aneurysm status post endovascular repair in June 2017.   DVT prophylaxis: SCDs. Code Status: DO NOT INTUBATE.  Family Communication: patient and son in room Disposition Plan: Home in 1-2 days with vascular surgery clearance once cr trending down    Consultants:  Vascular surgery  Procedures:  Renal  artery duplex on 9/21 showed occluded right renal artery  Antibiotics:  cipro   Objective: BP (!) 160/71 (BP Location: Left Arm)   Pulse 73   Temp 97.6 F (36.4 C) (Oral)   Resp 18   Ht 5\' 4"  (1.626 m)   Wt 68.7 kg (151 lb 6.4 oz)   SpO2 94%   BMI 25.99 kg/m   Intake/Output Summary (Last 24 hours) at 03/03/16 1235 Last data filed at 03/02/16 1800  Gross per 24 hour  Intake              240 ml  Output              800 ml  Net             -560 ml   Filed  Weights   03/01/16 0128  Weight: 68.7 kg (151 lb 6.4 oz)    Exam:   General:  NAD  Cardiovascular: RRR  Respiratory: CTABL  Abdomen: Soft/ND/NT, positive BS  Musculoskeletal: No Edema  Neuro: aaox3  Data Reviewed: Basic Metabolic Panel:  Recent Labs Lab 03/01/16 0208 03/02/16 0352 03/03/16 0207  NA 134* 132* 135  K 4.1 4.0 3.6  CL 101 101 101  CO2 24 24 23   GLUCOSE 116* 112* 100*  BUN 6 9 9   CREATININE 1.22* 1.30* 1.32*  CALCIUM 9.1 8.8* 9.1   Liver Function Tests:  Recent Labs Lab 03/01/16 0208  AST 33  ALT 18  ALKPHOS 93  BILITOT 0.5  PROT 7.5  ALBUMIN 3.3*   No results for input(s): LIPASE, AMYLASE in the last 168 hours. No results for input(s): AMMONIA in the last 168 hours. CBC:  Recent Labs Lab 03/01/16 0208 03/02/16 0352 03/03/16 0207  WBC 9.6 13.6* 12.1*  NEUTROABS 6.5  --   --   HGB 10.6* 9.6* 9.4*  HCT 34.5* 30.3* 30.2*  MCV 93.2 91.3 92.4  PLT 288 272 261   Cardiac Enzymes:   No results for input(s): CKTOTAL, CKMB, CKMBINDEX, TROPONINI in the last 168 hours. BNP (last 3 results) No results for input(s): BNP in the last  8760 hours.  ProBNP (last 3 results) No results for input(s): PROBNP in the last 8760 hours.  CBG: No results for input(s): GLUCAP in the last 168 hours.  Recent Results (from the past 240 hour(s))  Culture, Urine     Status: Abnormal   Collection Time: 03/01/16  4:56 AM  Result Value Ref Range Status   Specimen Description URINE, RANDOM  Final   Special Requests NONE  Final   Culture 30,000 COLONIES/mL ENTEROCOCCUS FAECALIS (A)  Final   Report Status 03/03/2016 FINAL  Final   Organism ID, Bacteria ENTEROCOCCUS FAECALIS (A)  Final      Susceptibility   Enterococcus faecalis - MIC*    AMPICILLIN <=2 SENSITIVE Sensitive     LEVOFLOXACIN 1 SENSITIVE Sensitive     NITROFURANTOIN <=16 SENSITIVE Sensitive     VANCOMYCIN 1 SENSITIVE Sensitive     * 30,000 COLONIES/mL ENTEROCOCCUS FAECALIS     Studies: No  results found.  Scheduled Meds: . aspirin  81 mg Oral Daily  . atorvastatin  40 mg Oral q1800  . ciprofloxacin  400 mg Intravenous Q12H  . citalopram  20 mg Oral Daily  . folic acid  1 mg Oral Daily  . iron polysaccharides  150 mg Oral Daily  . isosorbide mononitrate  30 mg Oral Daily  . metoprolol succinate  50 mg Oral BID  . montelukast  10 mg Oral QHS  . omega-3 acid ethyl esters  1 g Oral Daily  . pantoprazole  40 mg Oral q morning - 10a  . polyethylene glycol  17 g Oral Daily  . senna-docusate  1 tablet Oral BID  . ticagrelor  60 mg Oral BID    Continuous Infusions:    Time spent:  Shanetra Blumenstock MD, PhD  [facility] Pager [phone]. If 7PM-7AM, please contact night-coverage at [email or web address], password [facility] 03/03/2016, 12:35 PM  LOS: 0 days

## 2016-03-03 NOTE — Progress Notes (Signed)
   VASCULAR SURGERY ASSESSMENT & PLAN:  Occluded right renal artery stent. No significant right flank pain this AM. Crt is 1.32, so has leveled off. As per Dr. York Pellant, would keep in hospital until crt starts to come down.   Urine Clt with Enterococcus faecalis- On IV cipro.   SUBJECTIVE: No specific complaints  PHYSICAL EXAM: Vitals:   03/02/16 0915 03/02/16 1357 03/02/16 1951 03/03/16 0432  BP: (!) 141/75 (!) 126/96 (!) 142/72 (!) 160/71  Pulse: 88 75 78 73  Resp:  18 18 18   Temp:  98.5 F (36.9 C) 98.6 F (37 C) 97.6 F (36.4 C)  TempSrc:  Oral Oral Oral  SpO2:  97% 93% 94%  Weight:      Height:       Abdomen non-tender.  Lungs clear.   LABS: Lab Results  Component Value Date   WBC 12.1 (H) 03/03/2016   HGB 9.4 (L) 03/03/2016   HCT 30.2 (L) 03/03/2016   MCV 92.4 03/03/2016   PLT 261 03/03/2016   Lab Results  Component Value Date   CREATININE 1.32 (H) 03/03/2016   Lab Results  Component Value Date   INR 1.28 12/02/2015   CBG (last 3)  No results for input(s): GLUCAP in the last 72 hours.  Principal Problem:   Renal infarct [facility]) Active Problems:   HTN (hypertension)   Hyperlipidemia   CAD (coronary artery disease), recent DES stents to RCA and LAD in 3/15 and 4/15   S/P AAA repair using bifurcation graft   Normochromic normocytic anemia    Cari Caraway Beeper: [phone] 03/03/2016

## 2016-03-04 LAB — ECHOCARDIOGRAM COMPLETE
CHL CUP REG VEL DIAS: 96.4 cm/s
EERAT: 5.42
EWDT: 327 ms
FS: 36 % (ref 28–44)
Height: 64 in
IVS/LV PW RATIO, ED: 0.77
LA vol index: 23.2 mL/m2
LADIAMINDEX: 2.01 cm/m2
LASIZE: 35 mm
LAVOL: 40.3 mL
LAVOLA4C: 32.5 mL
LDCA: 3.14 cm2
LEFT ATRIUM END SYS DIAM: 35 mm
LV E/e' medial: 5.42
LV E/e'average: 5.42
LV PW d: 9.97 mm — AB (ref 0.6–1.1)
LV TDI E'LATERAL: 9.57
LV TDI E'MEDIAL: 7.07
LV e' LATERAL: 9.57 cm/s
LVOT diameter: 20 mm
Lateral S' vel: 13.2 cm/s
MV Dec: 327
MV pk A vel: 77.6 m/s
MV pk E vel: 51.9 m/s
RV TAPSE: 28.5 mm
Weight: 2422.4 oz

## 2016-03-04 LAB — BASIC METABOLIC PANEL
ANION GAP: 9 (ref 5–15)
BUN: 8 mg/dL (ref 6–20)
CHLORIDE: 101 mmol/L (ref 101–111)
CO2: 25 mmol/L (ref 22–32)
Calcium: 9.2 mg/dL (ref 8.9–10.3)
Creatinine, Ser: 1.26 mg/dL — ABNORMAL HIGH (ref 0.44–1.00)
GFR calc Af Amer: 51 mL/min — ABNORMAL LOW (ref >60)
GFR, EST NON AFRICAN AMERICAN: 44 mL/min — AB (ref >60)
GLUCOSE: 93 mg/dL (ref 65–99)
POTASSIUM: 3.6 mmol/L (ref 3.5–5.1)
Sodium: 135 mmol/L (ref 135–145)

## 2016-03-04 LAB — CBC
HEMATOCRIT: 30.9 % — AB (ref 36.0–46.0)
HEMOGLOBIN: 9.6 g/dL — AB (ref 12.0–15.0)
MCH: 28.4 pg (ref 26.0–34.0)
MCHC: 31.1 g/dL (ref 30.0–36.0)
MCV: 91.4 fL (ref 78.0–100.0)
PLATELETS: 326 10*3/uL (ref 150–400)
RBC: 3.38 MIL/uL — AB (ref 3.87–5.11)
RDW: 16.6 % — ABNORMAL HIGH (ref 11.5–15.5)
WBC: 8.7 10*3/uL (ref 4.0–10.5)

## 2016-03-04 MED ORDER — CIPROFLOXACIN HCL 500 MG PO TABS: 500.0000 mg | ORAL_TABLET | Freq: Two times a day (BID) | ORAL | 0 refills | Status: AC

## 2016-03-04 NOTE — Progress Notes (Signed)
Pharmacy Antibiotic Note  Olivia Wade is a 65 y.o. female admitted on 03/01/2016 with UTI.  Pharmacy has been consulted for Ciprofloxacin dosing - day #4. Afebrile, wbc trend down to wnl. Urine cx grew E.faecalis 30k colonies (sensitive to FQ). CrCl~42.  Plan: Ciprofloxacin 400mg  IV q12h Will f/u micro data, renal function, and pt's clinical condition To be discharged today on PO  Height: 5\' 4"  (162.6 cm) Weight: 151 lb 6.4 oz (68.7 kg) IBW/kg (Calculated) : 54.7  Temp (24hrs), Avg:98.3 F (36.8 C), Min:98 F (36.7 C), Max:98.6 F (37 C)   Recent Labs Lab 03/01/16 0208 03/02/16 0352 03/03/16 0207 03/04/16 0232  WBC 9.6 13.6* 12.1* 8.7  CREATININE 1.22* 1.30* 1.32* 1.26*  LATICACIDVEN 1.2  --   --   --     Estimated Creatinine Clearance: 42.9 mL/min (by C-G formula based on SCr of 1.26 mg/dL (H)).    Allergies  Allergen Reactions  . Bee Venom Anaphylaxis, Hives and Swelling  . Hydrocodone Other (See Comments)  . Penicillins Other (See Comments)    PATIENT REPORTS "MOTHER TOLD HER SHE HAD ALLERGY TO PCN"  UNKNOWN REACTION TO PCN PCN reaction causing immediate rash, facial/tongue/throat swelling, SOB or lightheadedness with hypotension: unknown PCN reaction causing severe rash involving mucus membranes or skin necrosis: unknown PCN reaction that required hospitalization unknown PCN reaction occurring within the last 10 years: unknown  . Morphine And Related Rash    Antimicrobials this admission: 9/21 Cipro >>   Microbiology results:  UCx:  E.faecalis 30k col (S-levo)  Babs Bertin, PharmD, [facility] Clinical Pharmacist Pager [phone] 03/04/2016 12:37 PM

## 2016-03-04 NOTE — Discharge Summary (Signed)
Discharge Summary  Olivia Wade:[id number] DOB: August 08, 1950  PCP: Ernestine Conrad, MD  Admit date: 03/01/2016 Discharge date: 03/04/2016  Time spent: <21mins  Recommendations for Outpatient Follow-up:  1. F/u with PMD within a week  for hospital discharge follow up, repeat cbc/bmp at follow up 2. F/u with vascular surgery Dr Myra Gianotti   Discharge Diagnoses:  Active Hospital Problems   Diagnosis Date Noted  . Renal infarct (HCC) 03/01/2016  . Normochromic normocytic anemia 03/01/2016  . S/P AAA repair using bifurcation graft 12/02/2015  . CAD (coronary artery disease), recent DES stents to RCA and LAD in 3/15 and 4/15 09/14/2013  . Hyperlipidemia 05/26/2013  . HTN (hypertension) 05/26/2013    Resolved Hospital Problems   Diagnosis Date Noted Date Resolved  No resolved problems to display.    Discharge Condition: stable  Diet recommendation: heart healthy  Filed Weights   03/01/16 0128  Weight: 68.7 kg (151 lb 6.4 oz)    History of present illness:  Patient coming from: Patient was transferred from [facility].  Chief Complaint: Right flank pain.  HPI: Olivia Wade is a 65 y.o. female with history of abdominal aortic aneurysm status post endovascular grafting in June 2017, CAD status post stenting, hyperlipidemia, hypertension, chronic anemia started experiencing severe pain in the right flank since yesterday morning. Pain was associated with nausea and vomiting. CT abdomen and pelvis done in the ER at [facility] showed multifocal infarcts involving the right kidney with stenotic or occluded right renal artery likely at the level of the stent on this angiographic phase. On call vascular surgeon at [facility] Dr. Darrick Penna was contacted and Dr. Darrick Penna advised admission to [facility] for symptom management. Vascular surgery will be following as consult once contacted after arrival. On my exam patient is not in distress still has right flank pain. Denies any  blood in the urine.   ED Course: Patient was a direct admit.  Hospital Course:  Principal Problem:   Renal infarct [facility]) Active Problems:   HTN (hypertension)   Hyperlipidemia   CAD (coronary artery disease), recent DES stents to RCA and LAD in 3/15 and 4/15   S/P AAA repair using bifurcation graft   Normochromic normocytic anemia   1. Right-sided renal infarct- right renal artery occluded, pain relieving medications.  vascular surgery input appreciated. 2. History of CAD status post stenting- continue asa, BRILINTA, toprol-xl,statins and Imdur. 3. Possible UTI-urine culture + 30.000 enterococcus faecalis, sensitive to levaquin,  patient is empirically placed on Cipro since admission and she is discharged on cipro to finish total 5 days treatment. 4. Chronic anemia- follow CBC. hgb stable around 9.6. 5. Hypertension-  lisinopril held due to elevated cr, resumed at discharge, she is continued on betablocker and imdur.  6. Abdominal aortic aneurysm status post endovascular repair in June 2017. 7. AKI;  likely combination of right sided renal infarct and UTI. Cr pleateued at 1.32, trending down at discharge,  DVT prophylaxis:SCDs. Code Status:DO NOT INTUBATE. Family Communication:patient Disposition: home with vascular surgery clearance  Procedures:  none  Consultations:  Vascular surgery  Discharge Exam: BP (!) 146/94 (BP Location: Left Arm)   Pulse 70   Temp 98 F (36.7 C) (Oral)   Resp 18   Ht 5\' 4"  (1.626 m)   Wt 68.7 kg (151 lb 6.4 oz)   SpO2 99%   BMI 25.99 kg/m     General:  NAD  Cardiovascular: RRR  Respiratory: CTABL  Abdomen: Soft/ND/NT, positive BS  Musculoskeletal: No Edema  Neuro: aaox3   Discharge Instructions You were cared for by a hospitalist during your hospital stay. If you have any questions about your discharge medications or the care you received while you were in the hospital after you are discharged, you can call the unit  and asked to speak with the hospitalist on call if the hospitalist that took care of you is not available. Once you are discharged, your primary care physician will handle any further medical issues. Please note that NO REFILLS for any discharge medications will be authorized once you are discharged, as it is imperative that you return to your primary care physician (or establish a relationship with a primary care physician if you do not have one) for your aftercare needs so that they can reassess your need for medications and monitor your lab values.  Discharge Instructions    Diet - low sodium heart healthy    Complete by:  As directed    Increase activity slowly    Complete by:  As directed        Medication List    TAKE these medications   acetaminophen 325 MG tablet Commonly known as:  TYLENOL Take 325 mg by mouth every 6 (six) hours as needed for moderate pain.   albuterol 108 (90 Base) MCG/ACT inhaler Commonly known as:  PROVENTIL HFA;VENTOLIN HFA Inhale 1 puff into the lungs every 6 (six) hours as needed for wheezing or shortness of breath.   albuterol (2.5 MG/3ML) 0.083% nebulizer solution Commonly known as:  PROVENTIL Take 2.5 mg by nebulization every 6 (six) hours as needed for wheezing or shortness of breath.   ALPRAZolam 0.5 MG tablet Commonly known as:  XANAX Take 0.5 mg by mouth 3 (three) times daily as needed for anxiety or sleep. *Patient is prescribed one tablets three times daily as needed for anxiety   aspirin 81 MG tablet Take 81 mg by mouth daily.   atorvastatin 40 MG tablet Commonly known as:  LIPITOR Take 1 tablet (40 mg total) by mouth daily.   BRILINTA 60 MG Tabs tablet Generic drug:  ticagrelor Take 60 mg by mouth 2 (two) times daily.   ciprofloxacin 500 MG tablet Commonly known as:  CIPRO Take 1 tablet (500 mg total) by mouth 2 (two) times daily.   citalopram 20 MG tablet Commonly known as:  CELEXA Take 20 mg by mouth daily.     cyclobenzaprine 10 MG tablet Commonly known as:  FLEXERIL Take 1 tablet (10 mg total) by mouth 3 (three) times daily as needed for muscle spasms.   FISH OIL EXTRA STRENGTH PO Take by mouth.   folic acid 1 MG tablet Commonly known as:  FOLVITE Take 1 tablet (1 mg total) by mouth daily.   ICY HOT BACK EX Apply 1 application topically daily as needed (on back).   iron polysaccharides 150 MG capsule Commonly known as:  NIFEREX Take 1 capsule (150 mg total) by mouth daily.   isosorbide mononitrate 30 MG 24 hr tablet Commonly known as:  IMDUR Take 30 mg by mouth daily.   lisinopril 5 MG tablet Commonly known as:  PRINIVIL,ZESTRIL take 1 tablet by mouth once daily   MELATONIN PO Take 1 tablet by mouth daily as needed (sleep).   metoprolol succinate 50 MG 24 hr tablet Commonly known as:  TOPROL-XL Take 1 tablet (50 mg total) by mouth 2 (two) times daily.   montelukast 10 MG tablet Commonly known as:  SINGULAIR Take 10  mg by mouth at bedtime.   nitroGLYCERIN 0.4 MG SL tablet Commonly known as:  NITROSTAT Place 1 tablet (0.4 mg total) under the tongue every 5 (five) minutes as needed for chest pain.   pantoprazole 40 MG tablet Commonly known as:  PROTONIX take 1 tablet by mouth every morning   VITAMIN D PO Take 1 tablet by mouth daily.      Allergies  Allergen Reactions  . Bee Venom Anaphylaxis, Hives and Swelling  . Hydrocodone Other (See Comments)  . Penicillins Other (See Comments)    PATIENT REPORTS "MOTHER TOLD HER SHE HAD ALLERGY TO PCN"  UNKNOWN REACTION TO PCN PCN reaction causing immediate rash, facial/tongue/throat swelling, SOB or lightheadedness with hypotension: unknown PCN reaction causing severe rash involving mucus membranes or skin necrosis: unknown PCN reaction that required hospitalization unknown PCN reaction occurring within the last 10 years: unknown  . Morphine And Related Rash   Follow-up Information    Ernestine ConradBLUTH, KIRK, MD Follow up in 1  week(s).   Specialty:  Family Medicine Why:  hospital discharge follow up, repeat cbc/bmp at follow up. Contact information: [address] [place] [address] [phone]        Durene CalBrabham, [place], MD .   Specialties:  Vascular Surgery, Cardiology Contact information: [address] [place] [address] [phone]            The results of significant diagnostics from this hospitalization (including imaging, microbiology, ancillary and laboratory) are listed below for reference.    Significant Diagnostic Studies: No results found.  Microbiology: Recent Results (from the past 240 hour(s))  Culture, Urine     Status: Abnormal   Collection Time: 03/01/16  4:56 AM  Result Value Ref Range Status   Specimen Description URINE, RANDOM  Final   Special Requests NONE  Final   Culture 30,000 COLONIES/mL ENTEROCOCCUS FAECALIS (A)  Final   Report Status 03/03/2016 FINAL  Final   Organism ID, Bacteria ENTEROCOCCUS FAECALIS (A)  Final      Susceptibility   Enterococcus faecalis - MIC*    AMPICILLIN <=2 SENSITIVE Sensitive     LEVOFLOXACIN 1 SENSITIVE Sensitive     NITROFURANTOIN <=16 SENSITIVE Sensitive     VANCOMYCIN 1 SENSITIVE Sensitive     * 30,000 COLONIES/mL ENTEROCOCCUS FAECALIS     Labs: Basic Metabolic Panel:  Recent Labs Lab 03/01/16 0208 03/02/16 0352 03/03/16 0207 03/04/16 0232  NA 134* 132* 135 135  K 4.1 4.0 3.6 3.6  CL 101 101 101 101  CO2 24 24 23 25   GLUCOSE 116* 112* 100* 93  BUN 6 9 9 8   CREATININE 1.22* 1.30* 1.32* 1.26*  CALCIUM 9.1 8.8* 9.1 9.2   Liver Function Tests:  Recent Labs Lab 03/01/16 0208  AST 33  ALT 18  ALKPHOS 93  BILITOT 0.5  PROT 7.5  ALBUMIN 3.3*   No results for input(s): LIPASE, AMYLASE in the last 168 hours. No results for input(s): AMMONIA in the last 168 hours. CBC:  Recent Labs Lab 03/01/16 0208 03/02/16 0352 03/03/16 0207 03/04/16 0232  WBC 9.6 13.6* 12.1* 8.7  NEUTROABS 6.5  --   --   --   HGB  10.6* 9.6* 9.4* 9.6*  HCT 34.5* 30.3* 30.2* 30.9*  MCV 93.2 91.3 92.4 91.4  PLT 288 272 261 326   Cardiac Enzymes: No results for input(s): CKTOTAL, CKMB, CKMBINDEX, TROPONINI in the last 168 hours. BNP: BNP (last 3 results) No results for input(s): BNP in the last  8760 hours.  ProBNP (last 3 results) No results for input(s): PROBNP in the last 8760 hours.  CBG: No results for input(s): GLUCAP in the last 168 hours.     SignedAlbertine Grates MD, PhD  [facility] 03/04/2016, 11:04 AM

## 2016-03-04 NOTE — Progress Notes (Addendum)
[facility]  Subjective  - Reports no pain.  Objective (!) 146/94 70 98 F (36.7 C) (Oral) 18 99% No intake or output data in the 24 hours ending 03/04/16 0734  Abdomin soft Feet warm and well perfused  Assessment/Planning:  65 y.o. female is s/p fenestrated abdominal aortic graft 12/02/15 with right renal infarct.   Cr decreased 1.32 now 1.26 She will keep her previously scheduled appointment with me in several months.  Olivia Wade Olivia Wade 03/04/2016  BMET  Recent Labs  03/03/16 0207 03/04/16 0232  NA 135 135  K 3.6 3.6  CL 101 101  CO2 23 25  GLUCOSE 100* 93  BUN 9 8  CREATININE 1.32* 1.26*  CALCIUM 9.1 9.2   I have interviewed the patient and examined the patient. I agree with the findings by the PA.  No flank pain.  Crt improved.  OK for d/c from Vascular standpoint.   We will arrange f/u with Dr. Myra Gianotti.   Cari Caraway, MD [phone]

## 2016-03-04 NOTE — Progress Notes (Signed)
Pt/family given discharge instructions, medication lists, follow up appointments, and when to call the doctor.  Pt/family verbalizes understanding. Beverlyann Broxterman McClintock, RN   

## 2016-03-28 ENCOUNTER — Other Ambulatory Visit: Payer: Self-pay | Admitting: Cardiovascular Disease

## 2016-03-29 NOTE — Telephone Encounter (Signed)
REFILL 

## 2016-04-24 ENCOUNTER — Ambulatory Visit: Payer: BLUE CROSS/BLUE SHIELD | Admitting: Cardiovascular Disease

## 2016-04-25 ENCOUNTER — Other Ambulatory Visit: Payer: Self-pay | Admitting: *Deleted

## 2016-04-25 DIAGNOSIS — I714 Abdominal aortic aneurysm, without rupture, unspecified: Secondary | ICD-10-CM

## 2016-05-10 ENCOUNTER — Emergency Department (HOSPITAL_COMMUNITY): Payer: BLUE CROSS/BLUE SHIELD

## 2016-05-10 ENCOUNTER — Observation Stay (HOSPITAL_COMMUNITY)
Admission: EM | Admit: 2016-05-10 | Discharge: 2016-05-11 | Disposition: A | Discharge status: Home / Self Care | Payer: BLUE CROSS/BLUE SHIELD | Attending: Family Medicine | Admitting: Family Medicine

## 2016-05-10 ENCOUNTER — Encounter (HOSPITAL_COMMUNITY): Payer: Self-pay

## 2016-05-10 DIAGNOSIS — M549 Dorsalgia, unspecified: Secondary | ICD-10-CM | POA: Insufficient documentation

## 2016-05-10 DIAGNOSIS — I2510 Atherosclerotic heart disease of native coronary artery without angina pectoris: Secondary | ICD-10-CM | POA: Diagnosis not present

## 2016-05-10 DIAGNOSIS — I2583 Coronary atherosclerosis due to lipid rich plaque: Secondary | ICD-10-CM

## 2016-05-10 DIAGNOSIS — R110 Nausea: Secondary | ICD-10-CM | POA: Insufficient documentation

## 2016-05-10 DIAGNOSIS — R079 Chest pain, unspecified: Secondary | ICD-10-CM | POA: Diagnosis not present

## 2016-05-10 DIAGNOSIS — R0602 Shortness of breath: Secondary | ICD-10-CM | POA: Diagnosis not present

## 2016-05-10 DIAGNOSIS — I10 Essential (primary) hypertension: Secondary | ICD-10-CM | POA: Diagnosis present

## 2016-05-10 HISTORY — DX: Disorder of kidney and ureter, unspecified: N28.9

## 2016-05-10 LAB — BASIC METABOLIC PANEL
Anion gap: 7 (ref 5–15)
BUN: 14 mg/dL (ref 6–20)
CHLORIDE: 105 mmol/L (ref 101–111)
CO2: 23 mmol/L (ref 22–32)
CREATININE: 1.18 mg/dL — AB (ref 0.44–1.00)
Calcium: 8.7 mg/dL — ABNORMAL LOW (ref 8.9–10.3)
GFR calc Af Amer: 55 mL/min — ABNORMAL LOW (ref >60)
GFR calc non Af Amer: 48 mL/min — ABNORMAL LOW (ref >60)
Glucose, Bld: 107 mg/dL — ABNORMAL HIGH (ref 65–99)
POTASSIUM: 3.8 mmol/L (ref 3.5–5.1)
SODIUM: 135 mmol/L (ref 135–145)

## 2016-05-10 LAB — TROPONIN I: Troponin I: <0.03 ng/mL (ref <0.03)

## 2016-05-10 LAB — CBC
HEMATOCRIT: 34.6 % — AB (ref 36.0–46.0)
Hemoglobin: 11 g/dL — ABNORMAL LOW (ref 12.0–15.0)
MCH: 29.9 pg (ref 26.0–34.0)
MCHC: 31.8 g/dL (ref 30.0–36.0)
MCV: 94 fL (ref 78.0–100.0)
Platelets: 341 10*3/uL (ref 150–400)
RBC: 3.68 MIL/uL — ABNORMAL LOW (ref 3.87–5.11)
RDW: 14.4 % (ref 11.5–15.5)
WBC: 6.9 10*3/uL (ref 4.0–10.5)

## 2016-05-10 MED ORDER — POLYSACCHARIDE IRON COMPLEX 150 MG PO CAPS
150.0000 mg | ORAL_CAPSULE | Freq: Every day | ORAL | Status: DC
  Administered 2016-05-10 – 2016-05-11 (×2): 150 mg via ORAL
  Filled 2016-05-10 (×2): qty 1

## 2016-05-10 MED ORDER — ISOSORBIDE MONONITRATE ER 60 MG PO TB24
30.0000 mg | ORAL_TABLET | Freq: Every day | ORAL | Status: DC
  Administered 2016-05-10 – 2016-05-11 (×2): 30 mg via ORAL
  Filled 2016-05-10 (×2): qty 1

## 2016-05-10 MED ORDER — CITALOPRAM HYDROBROMIDE 20 MG PO TABS
20.0000 mg | ORAL_TABLET | Freq: Every day | ORAL | Status: DC
  Administered 2016-05-10 – 2016-05-11 (×2): 20 mg via ORAL
  Filled 2016-05-10 (×2): qty 1

## 2016-05-10 MED ORDER — PANTOPRAZOLE SODIUM 40 MG PO TBEC
40.0000 mg | DELAYED_RELEASE_TABLET | Freq: Every morning | ORAL | Status: DC
  Administered 2016-05-10 – 2016-05-11 (×2): 40 mg via ORAL
  Filled 2016-05-10 (×2): qty 1

## 2016-05-10 MED ORDER — ATORVASTATIN CALCIUM 40 MG PO TABS
40.0000 mg | ORAL_TABLET | Freq: Every day | ORAL | Status: DC
  Administered 2016-05-10 – 2016-05-11 (×2): 40 mg via ORAL
  Filled 2016-05-10 (×2): qty 1

## 2016-05-10 MED ORDER — CHOLECALCIFEROL 10 MCG (400 UNIT) PO TABS
400.0000 [IU] | ORAL_TABLET | Freq: Every day | ORAL | Status: DC
  Administered 2016-05-10 – 2016-05-11 (×2): 400 [IU] via ORAL
  Filled 2016-05-10 (×2): qty 1

## 2016-05-10 MED ORDER — MONTELUKAST SODIUM 10 MG PO TABS
10.0000 mg | ORAL_TABLET | Freq: Every day | ORAL | Status: DC
  Administered 2016-05-10: 10 mg via ORAL
  Filled 2016-05-10: qty 1

## 2016-05-10 MED ORDER — CYCLOBENZAPRINE HCL 10 MG PO TABS: 10.0000 mg | ORAL_TABLET | Freq: Three times a day (TID) | ORAL | Status: DC | PRN

## 2016-05-10 MED ORDER — LISINOPRIL 5 MG PO TABS
5.0000 mg | ORAL_TABLET | Freq: Every day | ORAL | Status: DC
  Administered 2016-05-10 – 2016-05-11 (×2): 5 mg via ORAL
  Filled 2016-05-10 (×2): qty 1

## 2016-05-10 MED ORDER — ENOXAPARIN SODIUM 40 MG/0.4ML ~~LOC~~ SOLN
40.0000 mg | SUBCUTANEOUS | Status: DC
  Administered 2016-05-10: 40 mg via SUBCUTANEOUS
  Filled 2016-05-10: qty 0.4

## 2016-05-10 MED ORDER — TICAGRELOR 60 MG PO TABS
60.0000 mg | ORAL_TABLET | Freq: Two times a day (BID) | ORAL | Status: DC
  Administered 2016-05-10: 60 mg via ORAL
  Filled 2016-05-10 (×8): qty 1

## 2016-05-10 MED ORDER — ALPRAZOLAM 0.5 MG PO TABS
0.5000 mg | ORAL_TABLET | Freq: Three times a day (TID) | ORAL | Status: DC | PRN
  Administered 2016-05-10: 0.5 mg via ORAL
  Filled 2016-05-10: qty 1

## 2016-05-10 MED ORDER — ASPIRIN 81 MG PO CHEW
81.0000 mg | CHEWABLE_TABLET | Freq: Every day | ORAL | Status: DC
  Administered 2016-05-11: 81 mg via ORAL
  Filled 2016-05-10: qty 1

## 2016-05-10 MED ORDER — ALBUTEROL SULFATE (2.5 MG/3ML) 0.083% IN NEBU: 2.5000 mg | INHALATION_SOLUTION | Freq: Four times a day (QID) | RESPIRATORY_TRACT | Status: DC | PRN

## 2016-05-10 MED ORDER — ONDANSETRON HCL 4 MG/2ML IJ SOLN: 4.0000 mg | Freq: Four times a day (QID) | INTRAMUSCULAR | Status: DC | PRN

## 2016-05-10 MED ORDER — ALBUTEROL SULFATE HFA 108 (90 BASE) MCG/ACT IN AERS: 1.0000 | INHALATION_SPRAY | Freq: Four times a day (QID) | RESPIRATORY_TRACT | Status: DC | PRN

## 2016-05-10 MED ORDER — FOLIC ACID 1 MG PO TABS
1.0000 mg | ORAL_TABLET | Freq: Every day | ORAL | Status: DC
  Administered 2016-05-10 – 2016-05-11 (×2): 1 mg via ORAL
  Filled 2016-05-10 (×2): qty 1

## 2016-05-10 MED ORDER — METOPROLOL SUCCINATE ER 50 MG PO TB24
50.0000 mg | ORAL_TABLET | Freq: Two times a day (BID) | ORAL | Status: DC
  Administered 2016-05-10 – 2016-05-11 (×2): 50 mg via ORAL
  Filled 2016-05-10 (×2): qty 1

## 2016-05-10 MED ORDER — ACETAMINOPHEN 325 MG PO TABS: 325.0000 mg | ORAL_TABLET | Freq: Four times a day (QID) | ORAL | Status: DC | PRN

## 2016-05-10 NOTE — ED Provider Notes (Signed)
[facility]-EMERGENCY DEPT Provider Note   CSN: [id number] Arrival date & time: 05/10/16  1023  By signing my name below, I, Placido Sou, attest that this documentation has been prepared under the direction and in the presence of Vanetta Mulders, MD. Electronically Signed: Placido Sou, ED Scribe. 05/10/16. 11:53 AM.   History   Chief Complaint Chief Complaint  Patient presents with  . Chest Pain    HPI HPI Comments: Olivia Wade is a 65 y.o. female with a significant medical history listed below who presents to the Emergency Department by ambulance complaining of constant, moderate, left sided chest pain onset ~2 hours ago. She states she was feeling normal when waking this morning and began experiencing her CP. She rates her current CP as 2/10 and states her pain radiates down her LUE. Pt reports associated SOB and nausea. Pt took 4x baby aspirin and was given 1 NTG by EMS and denies relief of her CP. Pt has a h/o MI and confirms having two stents in place. Pt states she has been taking brilinta since 2015. She denies vomiting or other associated symptoms at this time.   The history is provided by the patient and medical records. No language interpreter was used.  Chest Pain   This is a new problem. The current episode started 1 to 2 hours ago. The problem occurs constantly. The problem has been gradually improving. The pain is present in the lateral region. The pain is mild. The pain radiates to the left arm. Associated symptoms include back pain, headaches, nausea and shortness of breath. Pertinent negatives include no abdominal pain, no cough, no fever, no vomiting and no weakness. She has tried nitroglycerin (aspirin) for the symptoms. The treatment provided no relief.    Past Medical History:  Diagnosis Date  . AAA (abdominal aortic aneurysm) (HCC)   . Anxiety    Panic attacks  . Arthritis   . Asthma   . Chronic low back pain    (Old vertebral fracture)  . Complication of  anesthesia   . COPD (chronic obstructive pulmonary disease) (HCC)   . Coronary artery disease 09/08/13   with PCI  . Depression   . Family history of adverse reaction to anesthesia     mother - slow to awaken  . GERD (gastroesophageal reflux disease)   . Heart palpitations    prn toprol for rapid HR  . History of surgery on arm   . HLD (hyperlipidemia)   . HTN (hypertension)   . NSTEMI (non-ST elevated myocardial infarction) (HCC) 09/08/13  . PONV (postoperative nausea and vomiting)   . Renal disorder    ischemic kidney   . Tobacco abuse     Patient Active Problem List   Diagnosis Date Noted  . Renal infarct (HCC) 03/01/2016  . Normochromic normocytic anemia 03/01/2016  . S/P AAA repair using bifurcation graft 12/02/2015  . Aneurysm of infrarenal abdominal aorta (HCC) 11/21/2015  . Abdominal aortic aneurysm (AAA) >39 mm diameter (HCC) 11/21/2015  . Chest pain 03/27/2014  . Numbness and tingling in right hand 02/05/2014  . Sinus tachycardia, with exertion 12/23/2013  . Chest pain at rest, negative MI, probable GI 12/22/2013  . Syncope, secondary to NTG and standing 12/22/2013  . CAD (coronary artery disease), recent DES stents to RCA and LAD in 3/15 and 4/15 09/14/2013  . NSTEMI (non-ST elevated myocardial infarction) (HCC) 09/08/2013  . Unstable angina (HCC) 09/08/2013  . ACS (acute coronary syndrome) (HCC) 09/08/2013  . Abdominal aortic aneurysm  05/26/2013  . HTN (hypertension) 05/26/2013  . Hyperlipidemia 05/26/2013  . GERD (gastroesophageal reflux disease) 05/26/2013  . Chronic low back pain 05/26/2013  . Asthma with COPD 05/26/2013  . Tobacco abuse 05/26/2013    Past Surgical History:  Procedure Laterality Date  . 2d echo  12/2012   normal LV with mild LVH, grade 1 diastolic dysfunction  . ABDOMINAL AORTIC ANEURYSM REPAIR  12/02/2015   ABDOMINAL AORTIC ENDOVASCULAR FENESTRATED STENT GRAFT (N/A)  . ABDOMINAL AORTIC DUPLEX  05/19/2012   WHEN COMPARED TO PRIOR STUDY  DATED 06/01/11 THERE IS AN INCREASE IN SIZE OF DILATATION.  Marland Kitchen ABDOMINAL AORTIC ENDOVASCULAR FENESTRATED STENT GRAFT N/A 12/02/2015   Procedure: ABDOMINAL AORTIC ENDOVASCULAR FENESTRATED STENT GRAFT;  Surgeon: Nada Libman, MD;  Location[facility] OR;  Service: Vascular;  Laterality: N/A;  . ABDOMINAL HYSTERECTOMY    . CHOLECYSTECTOMY    . CORONARY ANGIOPLASTY WITH STENT PLACEMENT  09/10/13   PCI of RCA 90% proximal RCA stenosis with DES  . CORONARY STENT PLACEMENT  09/08/13   PCI with cutting balloon, PTCA,DES to prox LAD  . CYST EXCISION Left    wrist  . DOPPLER ECHOCARDIOGRAPHY  12/31/2012  . LAPAROSCOPIC NISSEN FUNDOPLICATION     for severe reflux  . LEFT HEART CATHETERIZATION WITH CORONARY ANGIOGRAM N/A 09/08/2013   Procedure: LEFT HEART CATHETERIZATION WITH CORONARY ANGIOGRAM;  Surgeon: Lennette Bihari, MD;  Location: [facility] CATH LAB;  Service: Cardiovascular;  Laterality: N/A;  . NUCLEAR STRESS TEST  05/09/2011   NORMAL PATTREN OF PERFUSION IN ALL REGIONS. LV IS NORMAL IS SIZE. EF 68% NO WALL MOTION ABNORMALITIES.  Marland Kitchen PERCUTANEOUS CORONARY STENT INTERVENTION (PCI-S) N/A 09/10/2013   Procedure: PERCUTANEOUS CORONARY STENT INTERVENTION (PCI-S);  Surgeon: Lennette Bihari, MD;  Location: [facility] CATH LAB;  Service: Cardiovascular;  Laterality: N/A;    OB History    No data available       Home Medications    Prior to Admission medications   Medication Sig Start Date End Date Taking? Authorizing Provider  acetaminophen (TYLENOL) 325 MG tablet Take 325 mg by mouth every 6 (six) hours as needed for moderate pain.   Yes Historical Provider, MD  albuterol (PROVENTIL HFA;VENTOLIN HFA) 108 (90 BASE) MCG/ACT inhaler Inhale 1 puff into the lungs every 6 (six) hours as needed for wheezing or shortness of breath.   Yes Historical Provider, MD  albuterol (PROVENTIL) (2.5 MG/3ML) 0.083% nebulizer solution Take 2.5 mg by nebulization every 6 (six) hours as needed for wheezing or shortness of breath.   Yes Historical  Provider, MD  ALPRAZolam Prudy Feeler) 0.5 MG tablet Take 0.5 mg by mouth 3 (three) times daily as needed for anxiety or sleep. *Patient is prescribed one tablets three times daily as needed for anxiety   Yes Historical Provider, MD  aspirin 81 MG tablet Take 81 mg by mouth daily.   Yes Historical Provider, MD  atorvastatin (LIPITOR) 40 MG tablet Take 1 tablet (40 mg total) by mouth daily. 11/11/15  Yes Lennette Bihari, MD  BRILINTA 60 MG TABS tablet Take 60 mg by mouth 2 (two) times daily. 01/11/16  Yes Historical Provider, MD  Cholecalciferol (VITAMIN D PO) Take 1 tablet by mouth daily.   Yes Historical Provider, MD  citalopram (CELEXA) 20 MG tablet Take 20 mg by mouth daily.   Yes Historical Provider, MD  cyclobenzaprine (FLEXERIL) 10 MG tablet Take 1 tablet (10 mg total) by mouth 3 (three) times daily as needed for muscle spasms.   Yes  Lennette Bihari, MD  folic acid (FOLVITE) 1 MG tablet Take 1 tablet (1 mg total) by mouth daily. 12/12/15  Yes Lennette Bihari, MD  iron polysaccharides (NIFEREX) 150 MG capsule Take 1 capsule (150 mg total) by mouth daily. 09/12/15  Yes Lennette Bihari, MD  isosorbide mononitrate (IMDUR) 30 MG 24 hr tablet Take 30 mg by mouth daily.   Yes Historical Provider, MD  lisinopril (PRINIVIL,ZESTRIL) 5 MG tablet Take 1 tablet (5 mg total) by mouth daily. KEEP OV. 03/29/16  Yes Lennette Bihari, MD  MELATONIN PO Take 1 tablet by mouth daily as needed (sleep).   Yes Historical Provider, MD  Menthol, Topical Analgesic, (ICY HOT BACK EX) Apply 1 application topically daily as needed (on back).   Yes Historical Provider, MD  metoprolol succinate (TOPROL-XL) 50 MG 24 hr tablet Take 1 tablet (50 mg total) by mouth 2 (two) times daily. 11/02/15  Yes Laqueta Linden, MD  montelukast (SINGULAIR) 10 MG tablet Take 10 mg by mouth at bedtime.   Yes Historical Provider, MD  nitroGLYCERIN (NITROSTAT) 0.4 MG SL tablet Place 1 tablet (0.4 mg total) under the tongue every 5 (five) minutes as needed for  chest pain. 08/15/15  Yes Lennette Bihari, MD  Omega-3 Fatty Acids (FISH OIL EXTRA STRENGTH PO) Take by mouth.   Yes Historical Provider, MD  pantoprazole (PROTONIX) 40 MG tablet take 1 tablet by mouth every morning 02/09/16  Yes Laqueta Linden, MD    Family History Family History  Problem Relation Age of Onset  . Heart attack Father   . Cancer Father     Esophageal  . Heart disease Father     before age 58  . Hypertension Mother   . Heart attack Son 16  . Heart disease Son     before age 80    Social History Social History  Substance Use Topics  . Smoking status: Former Smoker    Packs/day: 0.50    Years: 37.00    Types: Cigarettes    Quit date: 09/08/2013  . Smokeless tobacco: Never Used  . Alcohol use No     Allergies   Bee venom; Hydrocodone; Penicillins; and Morphine and related   Review of Systems Review of Systems  Constitutional: Negative for chills and fever.  HENT: Negative for congestion, postnasal drip, rhinorrhea, sinus pressure and sore throat.   Eyes: Negative for visual disturbance.  Respiratory: Positive for shortness of breath. Negative for cough.   Cardiovascular: Positive for chest pain. Negative for leg swelling.  Gastrointestinal: Positive for nausea. Negative for abdominal pain, diarrhea and vomiting.  Genitourinary: Negative for dysuria and hematuria.  Musculoskeletal: Positive for back pain.  Skin: Negative for rash.  Neurological: Positive for headaches. Negative for syncope, weakness and light-headedness.  Hematological: Bruises/bleeds easily.  Psychiatric/Behavioral: Negative for confusion.  All other systems reviewed and are negative.   Physical Exam Updated Vital Signs BP 160/85   Pulse 64   Temp 98.3 F (36.8 C) (Oral)   Resp 10   Ht 5\' 4"  (1.626 m)   Wt 64.4 kg   SpO2 94%   BMI 24.37 kg/m   Physical Exam  Constitutional: She is oriented to person, place, and time. She appears well-developed and well-nourished. No  distress.  HENT:  Head: Normocephalic and atraumatic.  Mouth/Throat: Oropharynx is clear and moist and mucous membranes are normal.  Eyes: Conjunctivae and EOM are normal. Pupils are equal, round, and reactive to light. No scleral icterus.  Neck: Normal range of motion. Neck supple. No tracheal deviation present.  Cardiovascular: Normal rate, regular rhythm, normal heart sounds and intact distal pulses.   No murmur heard. Pulmonary/Chest: Effort normal and breath sounds normal. No respiratory distress. She has no wheezes. She has no rales.  99% on RA during examination   Abdominal: Soft. Bowel sounds are normal. There is no tenderness.  Musculoskeletal: Normal range of motion. She exhibits no edema.  No swelling noted in the bilateral ankles.   Neurological: She is alert and oriented to person, place, and time. No cranial nerve deficit. She exhibits normal muscle tone. Coordination normal.  Skin: Skin is warm and dry.  Psychiatric: She has a normal mood and affect. Her behavior is normal.  Nursing note and vitals reviewed.  ED Treatments / Results  Labs (all labs ordered are listed, but only abnormal results are displayed) Labs Reviewed  BASIC METABOLIC PANEL - Abnormal; Notable for the following:       Result Value   Glucose, Bld 107 (*)    Creatinine, Ser 1.18 (*)    Calcium 8.7 (*)    GFR calc non Af Amer 48 (*)    GFR calc Af Amer 55 (*)    All other components within normal limits  CBC - Abnormal; Notable for the following:    RBC 3.68 (*)    Hemoglobin 11.0 (*)    HCT 34.6 (*)    All other components within normal limits  TROPONIN I   Results for orders placed or performed during the hospital encounter of 05/10/16  Basic metabolic panel  Result Value Ref Range   Sodium 135 135 - 145 mmol/L   Potassium 3.8 3.5 - 5.1 mmol/L   Chloride 105 101 - 111 mmol/L   CO2 23 22 - 32 mmol/L   Glucose, Bld 107 (H) 65 - 99 mg/dL   BUN 14 6 - 20 mg/dL   Creatinine, Ser 2.951.18 (H)  0.44 - 1.00 mg/dL   Calcium 8.7 (L) 8.9 - 10.3 mg/dL   GFR calc non Af Amer 48 (L) >60 mL/min   GFR calc Af Amer 55 (L) >60 mL/min   Anion gap 7 5 - 15  CBC  Result Value Ref Range   WBC 6.9 4.0 - 10.5 K/uL   RBC 3.68 (L) 3.87 - 5.11 MIL/uL   Hemoglobin 11.0 (L) 12.0 - 15.0 g/dL   HCT 62.134.6 (L) 30.836.0 - 65.746.0 %   MCV 94.0 78.0 - 100.0 fL   MCH 29.9 26.0 - 34.0 pg   MCHC 31.8 30.0 - 36.0 g/dL   RDW 84.614.4 96.211.5 - 95.215.5 %   Platelets 341 150 - 400 K/uL  Troponin I  Result Value Ref Range   Troponin I <0.03 <0.03 ng/mL     EKG  EKG Interpretation  Date/Time:  Thursday May 10 2016 10:30:17 EST Ventricular Rate:  72 PR Interval:    QRS Duration: 106 QT Interval:  405 QTC Calculation: 444 R Axis:   45 Text Interpretation:  Sinus rhythm RSR' in V1 or V2, right VCD or RVH No significant change since last tracing Confirmed by Jolayne Branson  MD, Kawanna Christley [phone]) on 05/10/2016 12:25:39 PM       Radiology Dg Chest 2 View  Result Date: 05/10/2016 CLINICAL DATA:  65 year old female with history of chest pain this morning with some associated shortness of breath and nausea. EXAM: CHEST  2 VIEW COMPARISON:  Chest x-ray 03/23/2016. FINDINGS: Lung volumes are normal. No consolidative airspace  disease. No pleural effusions. No pneumothorax. No pulmonary nodule or mass noted. Pulmonary vasculature and the cardiomediastinal silhouette are within normal limits. Atherosclerosis in the thoracic aorta. Stent graft noted in the abdominal aorta, incompletely visualized. IMPRESSION: 1. No radiographic evidence of acute cardiopulmonary disease. 2. Aortic atherosclerosis. Electronically Signed   By: Trudie Reed M.D.   On: 05/10/2016 11:16    Procedures Procedures  DIAGNOSTIC STUDIES: Oxygen Saturation is 96% on RA, normal by my interpretation.    COORDINATION OF CARE: 11:38 AM Discussed next steps with pt. Pt verbalized understanding and is agreeable with the plan.    Medications Ordered in  ED Medications - No data to display   Initial Impression / Assessment and Plan / ED Course  I have reviewed the triage vital signs and the nursing notes.  Pertinent labs & imaging results that were available during my care of the patient were reviewed by me and considered in my medical decision making (see chart for details).  Clinical Course    Patient with history of 2 stents. Onset of chest pain this morning at 03/10/2009 out of 10 radiating to left arm left-sided chest. Now down to 1 out of 10. Patient's initial cardiac workup negative. Will need admission for rule out chest pain. Initial troponin was negative. Chest x-ray negative. EKG without acute changes.  I personally performed the services described in this documentation, which was scribed in my presence. The recorded information has been reviewed and is accurate.    Final Clinical Impressions(s) / ED Diagnoses   Final diagnoses:  Chest pain, unspecified type    New Prescriptions New Prescriptions   No medications on file     Vanetta Mulders, MD 05/10/16 1358

## 2016-05-10 NOTE — ED Triage Notes (Signed)
Chest pain 10/10 that started at 930 this morning,  Also some sob and nausea.  Pt took 4 baby aspirin and 1 ntg with relief.  Rates pain 2/10 now.  B/p 140/110.  NSR,  CBG 121

## 2016-05-10 NOTE — H&P (Signed)
TRH H&P    Patient Demographics:    Olivia Wade, is a 65 y.o. female  MRN: [id number]  DOB - 02-Jun-1951  Admit Date - 05/10/2016  Referring MD/NP/PA: Dr. Gustavus Messing  Outpatient Primary MD for the patient is Ernestine Conrad, MD  Patient coming from: Home  Chief Complaint  Patient presents with  . Chest Pain      HPI:    Olivia Wade  is a 64 y.o. female, With a history of CAD, status post coronary stent, hyperlipidemia, hypertension came to hospital with chest pain which started around 9 AM this morning. Patient says pain was severe associated with shortness of breath, she took aspirin and 2 tablets of nitroglycerin and the pain eased off. By the time EMS arrived the pain was 5/10 in intensity. At this time patient is pain-free. She denies nausea vomiting or diarrhea. No fever or dysuria. No dizziness no passing out episode.  In the ED EKG showed normal sinus rhythm, cardiac enzymes are negative. Patient will be placed under observation for chest pain rule out acute coronary syndrome      Review of systems:    In addition to the HPI above,  No Fever-chills, No Headache, No changes with Vision or hearing, No problems swallowing food or Liquids, No Abdominal pain, No Nausea or Vomiting, bowel movements are regular, No Blood in stool or Urine, No dysuria, No new skin rashes or bruises, No new joints pains-aches,  No new weakness, tingling, numbness in any extremity, No recent weight gain or loss, No polyuria, polydypsia or polyphagia, No significant Mental Stressors.  A full 10 point Review of Systems was done, except as stated above, all other Review of Systems were negative.   With Past History of the following :    Past Medical History:  Diagnosis Date  . AAA (abdominal aortic aneurysm) (HCC)   . Anxiety    Panic attacks  . Arthritis   . Asthma   . Chronic low back pain    (Old vertebral  fracture)  . Complication of anesthesia   . COPD (chronic obstructive pulmonary disease) (HCC)   . Coronary artery disease 09/08/13   with PCI  . Depression   . Family history of adverse reaction to anesthesia     mother - slow to awaken  . GERD (gastroesophageal reflux disease)   . Heart palpitations    prn toprol for rapid HR  . History of surgery on arm   . HLD (hyperlipidemia)   . HTN (hypertension)   . NSTEMI (non-ST elevated myocardial infarction) (HCC) 09/08/13  . PONV (postoperative nausea and vomiting)   . Renal disorder    ischemic kidney   . Tobacco abuse       Past Surgical History:  Procedure Laterality Date  . 2d echo  12/2012   normal LV with mild LVH, grade 1 diastolic dysfunction  . ABDOMINAL AORTIC ANEURYSM REPAIR  12/02/2015   ABDOMINAL AORTIC ENDOVASCULAR FENESTRATED STENT GRAFT (N/A)  . ABDOMINAL AORTIC DUPLEX  05/19/2012   WHEN COMPARED TO PRIOR STUDY  DATED 06/01/11 THERE IS AN INCREASE IN SIZE OF DILATATION.  Marland Kitchen ABDOMINAL AORTIC ENDOVASCULAR FENESTRATED STENT GRAFT N/A 12/02/2015   Procedure: ABDOMINAL AORTIC ENDOVASCULAR FENESTRATED STENT GRAFT;  Surgeon: Nada Libman, MD;  Location: [facility] OR;  Service: Vascular;  Laterality: N/A;  . ABDOMINAL HYSTERECTOMY    . CHOLECYSTECTOMY    . CORONARY ANGIOPLASTY WITH STENT PLACEMENT  09/10/13   PCI of RCA 90% proximal RCA stenosis with DES  . CORONARY STENT PLACEMENT  09/08/13   PCI with cutting balloon, PTCA,DES to prox LAD  . CYST EXCISION Left    wrist  . DOPPLER ECHOCARDIOGRAPHY  12/31/2012  . LAPAROSCOPIC NISSEN FUNDOPLICATION     for severe reflux  . LEFT HEART CATHETERIZATION WITH CORONARY ANGIOGRAM N/A 09/08/2013   Procedure: LEFT HEART CATHETERIZATION WITH CORONARY ANGIOGRAM;  Surgeon: Lennette Bihari, MD;  Location: [facility] CATH LAB;  Service: Cardiovascular;  Laterality: N/A;  . NUCLEAR STRESS TEST  05/09/2011   NORMAL PATTREN OF PERFUSION IN ALL REGIONS. LV IS NORMAL IS SIZE. EF 68% NO WALL MOTION  ABNORMALITIES.  Marland Kitchen PERCUTANEOUS CORONARY STENT INTERVENTION (PCI-S) N/A 09/10/2013   Procedure: PERCUTANEOUS CORONARY STENT INTERVENTION (PCI-S);  Surgeon: Lennette Bihari, MD;  Location: [facility] CATH LAB;  Service: Cardiovascular;  Laterality: N/A;      Social History:      Social History  Substance Use Topics  . Smoking status: Former Smoker    Packs/day: 0.50    Years: 37.00    Types: Cigarettes    Quit date: 09/08/2013  . Smokeless tobacco: Never Used  . Alcohol use No       Family History :     Family History  Problem Relation Age of Onset  . Heart attack Father   . Cancer Father     Esophageal  . Heart disease Father     before age 57  . Hypertension Mother   . Heart attack Son 53  . Heart disease Son     before age 57      Home Medications:   Prior to Admission medications   Medication Sig Start Date End Date Taking? Authorizing Provider  acetaminophen (TYLENOL) 325 MG tablet Take 325 mg by mouth every 6 (six) hours as needed for moderate pain.   Yes Historical Provider, MD  albuterol (PROVENTIL HFA;VENTOLIN HFA) 108 (90 BASE) MCG/ACT inhaler Inhale 1 puff into the lungs every 6 (six) hours as needed for wheezing or shortness of breath.   Yes Historical Provider, MD  albuterol (PROVENTIL) (2.5 MG/3ML) 0.083% nebulizer solution Take 2.5 mg by nebulization every 6 (six) hours as needed for wheezing or shortness of breath.   Yes Historical Provider, MD  ALPRAZolam Prudy Feeler) 0.5 MG tablet Take 0.5 mg by mouth 3 (three) times daily as needed for anxiety or sleep. *Patient is prescribed one tablets three times daily as needed for anxiety   Yes Historical Provider, MD  aspirin 81 MG tablet Take 81 mg by mouth daily.   Yes Historical Provider, MD  atorvastatin (LIPITOR) 40 MG tablet Take 1 tablet (40 mg total) by mouth daily. 11/11/15  Yes Lennette Bihari, MD  BRILINTA 60 MG TABS tablet Take 60 mg by mouth 2 (two) times daily. 01/11/16  Yes Historical Provider, MD  Cholecalciferol  (VITAMIN D PO) Take 1 tablet by mouth daily.   Yes Historical Provider, MD  citalopram (CELEXA) 20 MG tablet Take 20 mg by mouth daily.   Yes Historical Provider, MD  cyclobenzaprine (FLEXERIL) 10 MG  tablet Take 1 tablet (10 mg total) by mouth 3 (three) times daily as needed for muscle spasms.   Yes Lennette Biharihomas A Kelly, MD  folic acid (FOLVITE) 1 MG tablet Take 1 tablet (1 mg total) by mouth daily. 12/12/15  Yes Lennette Biharihomas A Kelly, MD  iron polysaccharides (NIFEREX) 150 MG capsule Take 1 capsule (150 mg total) by mouth daily. 09/12/15  Yes Lennette Biharihomas A Kelly, MD  isosorbide mononitrate (IMDUR) 30 MG 24 hr tablet Take 30 mg by mouth daily.   Yes Historical Provider, MD  lisinopril (PRINIVIL,ZESTRIL) 5 MG tablet Take 1 tablet (5 mg total) by mouth daily. KEEP OV. 03/29/16  Yes Lennette Biharihomas A Kelly, MD  MELATONIN PO Take 1 tablet by mouth daily as needed (sleep).   Yes Historical Provider, MD  Menthol, Topical Analgesic, (ICY HOT BACK EX) Apply 1 application topically daily as needed (on back).   Yes Historical Provider, MD  metoprolol succinate (TOPROL-XL) 50 MG 24 hr tablet Take 1 tablet (50 mg total) by mouth 2 (two) times daily. 11/02/15  Yes Laqueta LindenSuresh A Koneswaran, MD  montelukast (SINGULAIR) 10 MG tablet Take 10 mg by mouth at bedtime.   Yes Historical Provider, MD  nitroGLYCERIN (NITROSTAT) 0.4 MG SL tablet Place 1 tablet (0.4 mg total) under the tongue every 5 (five) minutes as needed for chest pain. 08/15/15  Yes Lennette Biharihomas A Kelly, MD  Omega-3 Fatty Acids (FISH OIL EXTRA STRENGTH PO) Take by mouth.   Yes Historical Provider, MD  pantoprazole (PROTONIX) 40 MG tablet take 1 tablet by mouth every morning 02/09/16  Yes Laqueta LindenSuresh A Koneswaran, MD     Allergies:     Allergies  Allergen Reactions  . Bee Venom Anaphylaxis, Hives and Swelling  . Hydrocodone Other (See Comments)  . Penicillins Other (See Comments)    PATIENT REPORTS "MOTHER TOLD HER SHE HAD ALLERGY TO PCN"  UNKNOWN REACTION TO PCN PCN reaction causing immediate  rash, facial/tongue/throat swelling, SOB or lightheadedness with hypotension: unknown PCN reaction causing severe rash involving mucus membranes or skin necrosis: unknown PCN reaction that required hospitalization unknown PCN reaction occurring within the last 10 years: unknown  . Morphine And Related Rash     Physical Exam:   Vitals  Blood pressure 150/92, pulse 64, temperature 98.3 F (36.8 C), temperature source Oral, resp. rate 23, height 5\' 4"  (1.626 m), weight 64.4 kg (142 lb), SpO2 98 %.  1.  General:  Appears in no acute distress  2. Psychiatric:  Intact judgement and  insight, awake alert, oriented x 3.  3. Neurologic: No focal neurological deficits, all cranial nerves intact.Strength 5/5 all 4 extremities, sensation intact all 4 extremities, plantars down going.  4. Eyes :  anicteric sclerae, moist conjunctivae with no lid lag. PERRLA.  5. ENMT:  Oropharynx clear with moist mucous membranes and good dentition  6. Neck:  supple, no cervical lymphadenopathy appriciated, No thyromegaly  7. Respiratory : Normal respiratory effort, good air movement bilaterally,clear to  auscultation bilaterally  8. Cardiovascular : RRR, no gallops, rubs or murmurs, no leg edema  9. Gastrointestinal:  Positive bowel sounds, abdomen soft, non-tender to palpation,no hepatosplenomegaly, no rigidity or guarding       10. Skin:  No cyanosis, normal texture and turgor, no rash, lesions or ulcers  11.Musculoskeletal:  Good muscle tone,  joints appear normal , no effusions,  normal range of motion    Data Review:    CBC  Recent Labs Lab 05/10/16 1038  WBC 6.9  HGB 11.0*  HCT 34.6*  PLT 341  MCV 94.0  MCH 29.9  MCHC 31.8  RDW 14.4   ------------------------------------------------------------------------------------------------------------------  Chemistries   Recent Labs Lab 05/10/16 1038  NA 135  K 3.8  CL 105  CO2 23  GLUCOSE 107*  BUN 14  CREATININE 1.18*    CALCIUM 8.7*   ------------------------------------------------------------------------------------------------------------------    Recent Labs Lab 05/10/16 1038  TROPONINI <0.03    --------------------------------------------------------------------------------------------------------------- Urine analysis:    Component Value Date/Time   COLORURINE YELLOW 03/01/2016 0456   APPEARANCEUR CLEAR 03/01/2016 0456   LABSPEC 1.014 03/01/2016 0456   PHURINE 5.5 03/01/2016 0456   GLUCOSEU NEGATIVE 03/01/2016 0456   HGBUR TRACE (A) 03/01/2016 0456   BILIRUBINUR NEGATIVE 03/01/2016 0456   KETONESUR NEGATIVE 03/01/2016 0456   PROTEINUR NEGATIVE 03/01/2016 0456   NITRITE NEGATIVE 03/01/2016 0456   LEUKOCYTESUR NEGATIVE 03/01/2016 0456      Imaging Results:    Dg Chest 2 View  Result Date: 05/10/2016 CLINICAL DATA:  65 year old female with history of chest pain this morning with some associated shortness of breath and nausea. EXAM: CHEST  2 VIEW COMPARISON:  Chest x-ray 03/23/2016. FINDINGS: Lung volumes are normal. No consolidative airspace disease. No pleural effusions. No pneumothorax. No pulmonary nodule or mass noted. Pulmonary vasculature and the cardiomediastinal silhouette are within normal limits. Atherosclerosis in the thoracic aorta. Stent graft noted in the abdominal aorta, incompletely visualized. IMPRESSION: 1. No radiographic evidence of acute cardiopulmonary disease. 2. Aortic atherosclerosis. Electronically Signed   By: Trudie Reed M.D.   On: 05/10/2016 11:16    My personal review of EKG: Rhythm NSR   Assessment & Plan:    Active Problems:   HTN (hypertension)   CAD (coronary artery disease), recent DES stents to RCA and LAD in 3/15 and 4/15   Chest pain   1. Chest pain- patient presents with typical chest pain, she has history of CAD. We'll place under observation, cycle troponin every 6 hours 3. 2. Coronary artery disease, status post stents 2- stable,  continue aspirin, brillinta, Toprol-XL, Imdur. 3. Hypertension- blood pressure is controlled, continue Toprol-XL, lisinopril 4. Hyperlipidemia-continue Lipitor.    DVT Prophylaxis-   Lovenox   AM Labs Ordered, also please review Full Orders  Family Communication: Admission, patients condition and plan of care including tests being ordered have been discussed with the patient and her husband at bedside* who indicate understanding and agree with the plan and Code Status.  Code Status:  Full code  Admission status: Observation    Time spent in minutes : 60 minutes   LAMA,GAGAN S M.D on 05/10/2016 at 1:18 PM  Between 7am to 7pm - Pager - [phone]. After 7pm go to [email or web address] - password [facility]  [facility] - Office  [phone]

## 2016-05-11 DIAGNOSIS — I2583 Coronary atherosclerosis due to lipid rich plaque: Secondary | ICD-10-CM | POA: Diagnosis not present

## 2016-05-11 DIAGNOSIS — I2510 Atherosclerotic heart disease of native coronary artery without angina pectoris: Secondary | ICD-10-CM | POA: Diagnosis not present

## 2016-05-11 DIAGNOSIS — I10 Essential (primary) hypertension: Secondary | ICD-10-CM | POA: Diagnosis not present

## 2016-05-11 DIAGNOSIS — R079 Chest pain, unspecified: Secondary | ICD-10-CM | POA: Diagnosis not present

## 2016-05-11 LAB — TROPONIN I: Troponin I: <0.03 ng/mL (ref <0.03)

## 2016-05-11 NOTE — Discharge Summary (Signed)
Physician Discharge Summary  Olivia Wade ZOX:[id number] DOB: 12/13/1950 DOA: 05/10/2016  PCP: Ernestine Conrad, MD  Admit date: 05/10/2016 Discharge date: 05/11/2016  Time spent: 35* minutes  Recommendations for Outpatient Follow-up:  Follow up PCP in 2 weeks   Discharge Diagnoses:  Active Problems:   HTN (hypertension)   CAD (coronary artery disease), recent DES stents to RCA and LAD in 3/15 and 4/15   Chest pain   Discharge Condition: Stable  Diet recommendation: Heart healthy diet  Filed Weights   05/10/16 1028 05/10/16 1544  Weight: 64.4 kg (142 lb) 68.2 kg (150 lb 5.7 oz)    History of present illness:   65 y.o. female, With a history of CAD, status post coronary stent, hyperlipidemia, hypertension came to hospital with chest pain which started around 9 AM this morning. Patient says pain was severe associated with shortness of breath, she took aspirin and 2 tablets of nitroglycerin and the pain eased off. By the time EMS arrived the pain was 5/10 in intensity. At this time patient is pain-free. She denies nausea vomiting or diarrhea. No fever or dysuria. No dizziness no passing out episode.  Hospital Course:  1. Chest pain- patient presented with chest pain, which resolved in ED. No more chest pain in the hospital, cardiac enzymes x 3 are negative in the hospital. 2. Coronary artery disease, status post stents 2- stable, continue aspirin, brillinta, Toprol-XL, Imdur. 3. Hypertension- blood pressure is controlled, continue Toprol-XL, lisinopril 4. Hyperlipidemia-continue Lipitor.  Procedures:  None   Consultations:  None   Discharge Exam: Vitals:   05/10/16 2230 05/11/16 0624  BP: 133/78 (!) 142/79  Pulse: 63 60  Resp: 18 16  Temp: 97.5 F (36.4 C) 98.1 F (36.7 C)    General: Appears in no acute distress Cardiovascular: RRR Respiratory: clear bilaterally  Discharge Instructions   Discharge Instructions    Diet - low sodium heart healthy    Complete  by:  As directed    Increase activity slowly    Complete by:  As directed      Current Discharge Medication List    CONTINUE these medications which have NOT CHANGED   Details  acetaminophen (TYLENOL) 325 MG tablet Take 325 mg by mouth every 6 (six) hours as needed for moderate pain.    albuterol (PROVENTIL HFA;VENTOLIN HFA) 108 (90 BASE) MCG/ACT inhaler Inhale 1 puff into the lungs every 6 (six) hours as needed for wheezing or shortness of breath.    albuterol (PROVENTIL) (2.5 MG/3ML) 0.083% nebulizer solution Take 2.5 mg by nebulization every 6 (six) hours as needed for wheezing or shortness of breath.    ALPRAZolam (XANAX) 0.5 MG tablet Take 0.5 mg by mouth 3 (three) times daily as needed for anxiety or sleep. *Patient is prescribed one tablets three times daily as needed for anxiety    aspirin 81 MG tablet Take 81 mg by mouth daily.    atorvastatin (LIPITOR) 40 MG tablet Take 1 tablet (40 mg total) by mouth daily. Qty: 30 tablet, Refills: 10    BRILINTA 60 MG TABS tablet Take 60 mg by mouth 2 (two) times daily. Refills: 1    Cholecalciferol (VITAMIN D PO) Take 1 tablet by mouth daily.    citalopram (CELEXA) 20 MG tablet Take 20 mg by mouth daily.    cyclobenzaprine (FLEXERIL) 10 MG tablet Take 1 tablet (10 mg total) by mouth 3 (three) times daily as needed for muscle spasms. Qty: 30 tablet, Refills: 6    folic  acid (FOLVITE) 1 MG tablet Take 1 tablet (1 mg total) by mouth daily. Qty: 30 tablet, Refills: 6    iron polysaccharides (NIFEREX) 150 MG capsule Take 1 capsule (150 mg total) by mouth daily. Qty: 30 capsule, Refills: 2    isosorbide mononitrate (IMDUR) 30 MG 24 hr tablet Take 30 mg by mouth daily.    lisinopril (PRINIVIL,ZESTRIL) 5 MG tablet Take 1 tablet (5 mg total) by mouth daily. KEEP OV. Qty: 30 tablet, Refills: 0    MELATONIN PO Take 1 tablet by mouth daily as needed (sleep).    Menthol, Topical Analgesic, (ICY HOT BACK EX) Apply 1 application topically  daily as needed (on back).    metoprolol succinate (TOPROL-XL) 50 MG 24 hr tablet Take 1 tablet (50 mg total) by mouth 2 (two) times daily. Qty: 60 tablet, Refills: 6    montelukast (SINGULAIR) 10 MG tablet Take 10 mg by mouth at bedtime.    nitroGLYCERIN (NITROSTAT) 0.4 MG SL tablet Place 1 tablet (0.4 mg total) under the tongue every 5 (five) minutes as needed for chest pain. Qty: 25 tablet, Refills: 5    Omega-3 Fatty Acids (FISH OIL EXTRA STRENGTH PO) Take by mouth.    pantoprazole (PROTONIX) 40 MG tablet take 1 tablet by mouth every morning Qty: 30 tablet, Refills: 3       Allergies  Allergen Reactions  . Bee Venom Anaphylaxis, Hives and Swelling  . Hydrocodone Other (See Comments)  . Penicillins Other (See Comments)    PATIENT REPORTS "MOTHER TOLD HER SHE HAD ALLERGY TO PCN"  UNKNOWN REACTION TO PCN PCN reaction causing immediate rash, facial/tongue/throat swelling, SOB or lightheadedness with hypotension: unknown PCN reaction causing severe rash involving mucus membranes or skin necrosis: unknown PCN reaction that required hospitalization unknown PCN reaction occurring within the last 10 years: unknown  . Morphine And Related Rash      The results of significant diagnostics from this hospitalization (including imaging, microbiology, ancillary and laboratory) are listed below for reference.    Significant Diagnostic Studies: Dg Chest 2 View  Result Date: 05/10/2016 CLINICAL DATA:  65 year old female with history of chest pain this morning with some associated shortness of breath and nausea. EXAM: CHEST  2 VIEW COMPARISON:  Chest x-ray 03/23/2016. FINDINGS: Lung volumes are normal. No consolidative airspace disease. No pleural effusions. No pneumothorax. No pulmonary nodule or mass noted. Pulmonary vasculature and the cardiomediastinal silhouette are within normal limits. Atherosclerosis in the thoracic aorta. Stent graft noted in the abdominal aorta, incompletely  visualized. IMPRESSION: 1. No radiographic evidence of acute cardiopulmonary disease. 2. Aortic atherosclerosis. Electronically Signed   By: Trudie Reedaniel  Entrikin M.D.   On: 05/10/2016 11:16    Microbiology: No results found for this or any previous visit (from the past 240 hour(s)).   Labs: Basic Metabolic Panel:  Recent Labs Lab 05/10/16 1038  NA 135  K 3.8  CL 105  CO2 23  GLUCOSE 107*  BUN 14  CREATININE 1.18*  CALCIUM 8.7*   CBC:  Recent Labs Lab 05/10/16 1038  WBC 6.9  HGB 11.0*  HCT 34.6*  MCV 94.0  PLT 341   Cardiac Enzymes:  Recent Labs Lab 05/10/16 1038 05/10/16 1602 05/10/16 2137 05/11/16 0335  TROPONINI <0.03 <0.03 <0.03 <0.03     Signed:  Meredeth IdeLAMA,Skyleen Bentley S MD.  [facility] 05/11/2016, 10:17 AM

## 2016-05-11 NOTE — Care Management Note (Signed)
Case Management Note  Patient Details  Name: Olivia Wade MRN: [id number] Date of Birth: Nov 12, 1950  Subjective/Objective:                  Pt admitted with CP. Chart reviewed for CM needs. PT is from home, lives with spouse and is ind with ADL's. She has PCP, transportation and insurance with drug coverage.   Action/Plan: Plan on pt returning home with self care.  Anticipate DC home today.  No CM needs anticipated.   Expected Discharge Date:  05/11/16               Expected Discharge Plan:  Home/Self Care  In-House Referral:  NA  Discharge planning Services  CM Consult  Post Acute Care Choice:  NA Choice offered to:  NA  Status of Service:  Completed, signed off  Malcolm Metro, RN 05/11/2016, 9:34 AM

## 2016-05-12 NOTE — Progress Notes (Signed)
Patient discharged with instructions, prescription, and care notes.  Verbalized understanding via teach back.  IV was removed and the site was WNL. Patient voiced no further complaints or concerns at the time of discharge.  Appointments scheduled per instructions.  Patient left the floor via w/c family  And staff in stable condition. 

## 2016-05-23 ENCOUNTER — Encounter: Payer: Self-pay | Admitting: Cardiovascular Disease

## 2016-05-23 ENCOUNTER — Ambulatory Visit (INDEPENDENT_AMBULATORY_CARE_PROVIDER_SITE_OTHER): Payer: BLUE CROSS/BLUE SHIELD | Admitting: Cardiovascular Disease

## 2016-05-23 VITALS — BP 124/90 | HR 70 | Ht 64.0 in | Wt 145.0 lb

## 2016-05-23 DIAGNOSIS — I25118 Atherosclerotic heart disease of native coronary artery with other forms of angina pectoris: Secondary | ICD-10-CM | POA: Diagnosis not present

## 2016-05-23 DIAGNOSIS — Z955 Presence of coronary angioplasty implant and graft: Secondary | ICD-10-CM | POA: Diagnosis not present

## 2016-05-23 DIAGNOSIS — Z9889 Other specified postprocedural states: Secondary | ICD-10-CM

## 2016-05-23 DIAGNOSIS — I10 Essential (primary) hypertension: Secondary | ICD-10-CM

## 2016-05-23 DIAGNOSIS — E782 Mixed hyperlipidemia: Secondary | ICD-10-CM

## 2016-05-23 DIAGNOSIS — Z8679 Personal history of other diseases of the circulatory system: Secondary | ICD-10-CM

## 2016-05-23 MED ORDER — ISOSORBIDE MONONITRATE ER 60 MG PO TB24: 60.0000 mg | ORAL_TABLET | Freq: Every day | ORAL | 6 refills | Status: DC

## 2016-05-23 NOTE — Progress Notes (Signed)
SUBJECTIVE: The patient presents for routine follow-up. She was recently hospitalized for chest pain and ruled out for an acute coronary syndrome.  Echocardiogram 03/03/16 normal left ventricular systolic function and regional wall motion, LVEF 55-60%, grade 1 diastolic dysfunction.  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  She has a history of coronary artery disease with prior stenting of the LAD and RCA with drug-eluting stents in late March and early April 2015, respectively. She has a 37 year history of tobacco abuse but quit in March 2015. She also has COPD and severe GERD status post Nissen fundoplication.  She underwent a normal nuclear stress test 08/16/15, calculated LVEF 55-65%.  She has chronic exertional dyspnea from COPD which is stable.  She has been having chest pain twice per week and has had to take nitroglycerin.   Review of Systems: As per "subjective", otherwise negative.  Allergies  Allergen Reactions  . Bee Venom Anaphylaxis, Hives and Swelling  . Hydrocodone Other (See Comments)  . Penicillins Other (See Comments)    PATIENT REPORTS "MOTHER TOLD HER SHE HAD ALLERGY TO PCN"  UNKNOWN REACTION TO PCN PCN reaction causing immediate rash, facial/tongue/throat swelling, SOB or lightheadedness with hypotension: unknown PCN reaction causing severe rash involving mucus membranes or skin necrosis: unknown PCN reaction that required hospitalization unknown PCN reaction occurring within the last 10 years: unknown  . Morphine And Related Rash    Current Outpatient Prescriptions  Medication Sig Dispense Refill  . acetaminophen (TYLENOL) 325 MG tablet Take 325 mg by mouth every 6 (six) hours as needed for moderate pain.    Marland Kitchen albuterol (PROVENTIL HFA;VENTOLIN HFA) 108 (90 BASE) MCG/ACT inhaler  Inhale 1 puff into the lungs every 6 (six) hours as needed for wheezing or shortness of breath.    Marland Kitchen albuterol (PROVENTIL) (2.5 MG/3ML) 0.083% nebulizer solution Take 2.5 mg by nebulization every 6 (six) hours as needed for wheezing or shortness of breath.    . ALPRAZolam (XANAX) 0.5 MG tablet Take 0.5 mg by mouth 3 (three) times daily as needed for anxiety or sleep. *Patient is prescribed one tablets three times daily as needed for anxiety    . aspirin 81 MG tablet Take 81 mg by mouth daily.    Marland Kitchen atorvastatin (LIPITOR) 40 MG tablet Take 1 tablet (40 mg total) by mouth daily. 30 tablet 10  . BRILINTA 60 MG TABS tablet Take 60 mg by mouth 2 (two) times daily.  1  . Cholecalciferol (VITAMIN D PO) Take 1 tablet by mouth daily.    . citalopram (CELEXA) 20 MG tablet Take 20 mg by mouth daily.    . cyclobenzaprine (FLEXERIL) 10 MG tablet Take 1 tablet (10 mg total) by mouth 3 (three) times daily as needed for muscle spasms. 30 tablet 6  . folic acid (FOLVITE) 1 MG tablet Take 1 tablet (1 mg total) by mouth daily. 30 tablet 6  . iron polysaccharides (NIFEREX) 150 MG capsule Take 1 capsule (150 mg total) by mouth daily. 30 capsule 2  . isosorbide mononitrate (IMDUR) 30 MG 24 hr tablet Take 30 mg by mouth daily.    Marland Kitchen lisinopril (PRINIVIL,ZESTRIL) 5 MG tablet Take 1 tablet (5 mg total) by mouth daily. KEEP OV. 30 tablet 0  . MELATONIN PO Take 1 tablet by mouth daily as needed (sleep).    . Menthol,  Topical Analgesic, (ICY HOT BACK EX) Apply 1 application topically daily as needed (on back).    . metoprolol succinate (TOPROL-XL) 50 MG 24 hr tablet Take 1 tablet (50 mg total) by mouth 2 (two) times daily. 60 tablet 6  . montelukast (SINGULAIR) 10 MG tablet Take 10 mg by mouth at bedtime.    . nitroGLYCERIN (NITROSTAT) 0.4 MG SL tablet Place 1 tablet (0.4 mg total) under the tongue every 5 (five) minutes as needed for chest pain. 25 tablet 5  . Omega-3 Fatty Acids (FISH OIL EXTRA STRENGTH PO) Take by mouth.      . pantoprazole (PROTONIX) 40 MG tablet take 1 tablet by mouth every morning 30 tablet 3   No current facility-administered medications for this visit.    Facility-Administered Medications Ordered in Other Visits  Medication Dose Route Frequency Provider Last Rate Last Dose  . 0.9 %  sodium chloride infusion   Intravenous Continuous Nada LibmanVance W Brabham, MD      . Chlorhexidine Gluconate Cloth 2 % PADS 6 each  6 each Topical Once Nada LibmanVance W Brabham, MD        Past Medical History:  Diagnosis Date  . AAA (abdominal aortic aneurysm) (HCC)   . Anxiety    Panic attacks  . Arthritis   . Asthma   . Chronic low back pain    (Old vertebral fracture)  . Complication of anesthesia   . COPD (chronic obstructive pulmonary disease) (HCC)   . Coronary artery disease 09/08/13   with PCI  . Depression   . Family history of adverse reaction to anesthesia     mother - slow to awaken  . GERD (gastroesophageal reflux disease)   . Heart palpitations    prn toprol for rapid HR  . History of surgery on arm   . HLD (hyperlipidemia)   . HTN (hypertension)   . NSTEMI (non-ST elevated myocardial infarction) (HCC) 09/08/13  . PONV (postoperative nausea and vomiting)   . Renal disorder    ischemic kidney   . Tobacco abuse     Past Surgical History:  Procedure Laterality Date  . 2d echo  12/2012   normal LV with mild LVH, grade 1 diastolic dysfunction  . ABDOMINAL AORTIC ANEURYSM REPAIR  12/02/2015   ABDOMINAL AORTIC ENDOVASCULAR FENESTRATED STENT GRAFT (N/A)  . ABDOMINAL AORTIC DUPLEX  05/19/2012   WHEN COMPARED TO PRIOR STUDY DATED 06/01/11 THERE IS AN INCREASE IN SIZE OF DILATATION.  Marland Kitchen. ABDOMINAL AORTIC ENDOVASCULAR FENESTRATED STENT GRAFT N/A 12/02/2015   Procedure: ABDOMINAL AORTIC ENDOVASCULAR FENESTRATED STENT GRAFT;  Surgeon: Nada LibmanVance W Brabham, MD;  [facility];  Service: Vascular;  Laterality: N/A;  . ABDOMINAL HYSTERECTOMY    . CHOLECYSTECTOMY    . CORONARY ANGIOPLASTY WITH STENT PLACEMENT   09/10/13   PCI of RCA 90% proximal RCA stenosis with DES  . CORONARY STENT PLACEMENT  09/08/13   PCI with cutting balloon, PTCA,DES to prox LAD  . CYST EXCISION Left    wrist  . DOPPLER ECHOCARDIOGRAPHY  12/31/2012  . LAPAROSCOPIC NISSEN FUNDOPLICATION     for severe reflux  . LEFT HEART CATHETERIZATION WITH CORONARY ANGIOGRAM N/A 09/08/2013   Procedure: LEFT HEART CATHETERIZATION WITH CORONARY ANGIOGRAM;  Surgeon: Lennette Biharihomas A Kelly, MD;  [facility];  Service: Cardiovascular;  Laterality: N/A;  . NUCLEAR STRESS TEST  05/09/2011   NORMAL PATTREN OF PERFUSION IN ALL REGIONS. LV IS NORMAL IS SIZE. EF 68% NO WALL MOTION ABNORMALITIES.  Marland Kitchen. PERCUTANEOUS CORONARY STENT  INTERVENTION (PCI-S) N/A 09/10/2013   Procedure: PERCUTANEOUS CORONARY STENT INTERVENTION (PCI-S);  Surgeon: Lennette Bihari, MD;  Location[facility] C[facility] LAB;  Service: Cardiovascular;  Laterality: N/A;    Social History   Social History  . Marital status: Married    Spouse name: N/A  . Number of children: N/A  . Years of education: N/A   Occupational History  . Not on file.   Social History Main Topics  . Smoking status: Former Smoker    Packs/day: 0.50    Years: 37.00    Types: Cigarettes    Quit date: 09/08/2013  . Smokeless tobacco: Never Used  . Alcohol use No  . Drug use: No  . Sexual activity: Not on file   Other Topics Concern  . Not on file   Social History Narrative  . No narrative on file     Vitals:   05/23/16 1440  BP: 124/90  Pulse: 70  SpO2: 97%  Weight: 145 lb (65.8 kg)  Height: 5\' 4"  (1.626 m)    PHYSICAL EXAM General: NAD HEENT: Normal. Neck: No JVD, no thyromegaly. Lungs: Clear to auscultation bilaterally with normal respiratory effort. CV: Nondisplaced PMI.  Regular rate and rhythm, normal S1/S2, no S3/S4, no murmur. No pretibial or periankle edema.  No carotid bruit.   Abdomen: Soft, no distention.  Neurologic: Alert and oriented.  Psych: Normal affect. Skin:  Normal. Musculoskeletal: No gross deformities.    ECG: Most recent ECG reviewed.      ASSESSMENT AND PLAN: 1. CAD with LAD and RCA stents with angina: Normal nuclear stress test as noted above with normal LV systolic function. Currently taking aspirin 81 mg, Brilinta 60 mg twice daily, Toprol-XL 50 mg twice daily, lisinopril 5 mg daily, Lipitor 40 mg, and Imdur (increase to 60 mg daily).  2. Essential HTN: Reasonably controlled. No changes.  3. Hyperlipidemia: Continue Lipitor 40 mg.  4. AAA s/pEVAR: Followed by vascular surgery.   Dispo: fu 6 months.  Prentice Docker, M.D., F.A.C.C.

## 2016-05-23 NOTE — Patient Instructions (Signed)
Medication Instructions:   Increase Imdur to 60mg  daily.  Continue all other medications.    Labwork: none  Testing/Procedures: none  Follow-Up: Your physician wants you to follow up in: 6 months.  You will receive a reminder letter in the mail one-two months in advance.  If you don't receive a letter, please call our office to schedule the follow up appointment   Any Other Special Instructions Will Be Listed Below (If Applicable).  If you need a refill on your cardiac medications before your next appointment, please call your pharmacy.

## 2016-05-27 ENCOUNTER — Other Ambulatory Visit: Payer: Self-pay | Admitting: Cardiovascular Disease

## 2016-06-07 ENCOUNTER — Other Ambulatory Visit: Payer: Self-pay | Admitting: Cardiovascular Disease

## 2016-07-09 ENCOUNTER — Ambulatory Visit
Admission: RE | Admit: 2016-07-09 | Discharge: 2016-07-09 | Disposition: A | Discharge status: Home / Self Care | Payer: BLUE CROSS/BLUE SHIELD | Source: Ambulatory Visit | Attending: Surgery | Admitting: Surgery

## 2016-07-09 ENCOUNTER — Encounter: Payer: Self-pay | Admitting: Surgery

## 2016-07-09 DIAGNOSIS — I714 Abdominal aortic aneurysm, without rupture, unspecified: Secondary | ICD-10-CM

## 2016-07-09 MED ORDER — IOPAMIDOL (ISOVUE-370) INJECTION 76%
50.0000 mL | Freq: Once | INTRAVENOUS | Status: AC | PRN
  Administered 2016-07-09: 50 mL via INTRAVENOUS

## 2016-07-16 ENCOUNTER — Ambulatory Visit (INDEPENDENT_AMBULATORY_CARE_PROVIDER_SITE_OTHER): Payer: BLUE CROSS/BLUE SHIELD | Admitting: Surgery

## 2016-07-16 ENCOUNTER — Other Ambulatory Visit: Payer: Self-pay

## 2016-07-16 VITALS — BP 161/102 | HR 72 | Temp 97.4°F | Resp 18 | Ht 64.0 in | Wt 147.0 lb

## 2016-07-16 DIAGNOSIS — I774 Celiac artery compression syndrome: Secondary | ICD-10-CM | POA: Diagnosis not present

## 2016-07-16 DIAGNOSIS — I714 Abdominal aortic aneurysm, without rupture, unspecified: Secondary | ICD-10-CM

## 2016-07-16 NOTE — Progress Notes (Signed)
[facility] [place]  Patient name: Olivia Wade MRN: [id number] DOB: 06/24/1950 Sex: female   REASON FOR VISIT:    Follow up FEVAR  HISOTRY OF PRESENT ILLNESS:   Olivia Wade is a 66 y.o. 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  She had stenting to bilateral renal arteries and a scallop around her superior mesenteric artery.  The following month she presented with flank and back pain and was found to have an occluded right renal artery stent.  She recovered from this and her pain improved.  Her overall abdominal pain has also improved.  She does complain of some difficulty and cramping in her right leg which is exacerbated by walking.  It mainly affects her thigh.   PAST MEDICAL HISTORY:   Past Medical History:  Diagnosis Date  . AAA (abdominal aortic aneurysm) (HCC)   . Anxiety    Panic attacks  . Arthritis   . Asthma   . Chronic low back pain    (Old vertebral fracture)  . Complication of anesthesia   . COPD (chronic obstructive pulmonary disease) (HCC)   . Coronary artery disease 09/08/13   with PCI  . Depression   . Family history of adverse reaction to anesthesia     mother - slow to awaken  . GERD (gastroesophageal reflux disease)   . Heart palpitations    prn toprol for rapid HR  . History of surgery on arm   . HLD (hyperlipidemia)   . HTN (hypertension)   . NSTEMI (non-ST elevated myocardial infarction) (HCC) 09/08/13  . PONV (postoperative nausea and vomiting)   . Renal disorder    ischemic kidney   . Tobacco abuse      FAMILY HISTORY:   Family History  Problem Relation Age of Onset  . Heart attack Father   . Cancer Father     Esophageal  . Heart disease Father     before age 55  . Hypertension Mother   . Heart attack Son 13  . Heart disease Son     before age 18    SOCIAL HISTORY:    Social History  Substance Use Topics  . Smoking status: Former Smoker    Packs/day: 0.50    Years: 37.00    Types: Cigarettes    Quit date: 09/08/2013  . Smokeless tobacco: Never Used  . Alcohol use No     ALLERGIES:   Allergies  Allergen Reactions  . Bee Venom Anaphylaxis, Hives and Swelling  . Hydrocodone Other (See Comments)  . Penicillins Other (See Comments)    PATIENT REPORTS "MOTHER TOLD HER SHE HAD ALLERGY TO PCN"  UNKNOWN REACTION TO PCN PCN reaction causing immediate rash, facial/tongue/throat swelling, SOB or lightheadedness with hypotension: unknown PCN reaction causing severe rash involving mucus membranes or skin necrosis: unknown PCN reaction that required hospitalization unknown PCN reaction occurring within the last 10 years: unknown  . Morphine And Related Rash     CURRENT MEDICATIONS:   Current Outpatient Prescriptions  Medication Sig Dispense Refill  . acetaminophen (TYLENOL) 325 MG tablet Take 325 mg by mouth every 6 (six) hours as needed for moderate pain.    Marland Kitchen albuterol (PROVENTIL HFA;VENTOLIN HFA) 108 (90 BASE) MCG/ACT inhaler Inhale 1 puff into the lungs every 6 (six) hours as needed for wheezing or shortness of breath.    Marland Kitchen albuterol (PROVENTIL) (2.5 MG/3ML) 0.083% nebulizer solution Take 2.5  mg by nebulization every 6 (six) hours as needed for wheezing or shortness of breath.    . ALPRAZolam (XANAX) 0.5 MG tablet Take 0.5 mg by mouth 3 (three) times daily as needed for anxiety or sleep. *Patient is prescribed one tablets three times daily as needed for anxiety    . aspirin 81 MG tablet Take 81 mg by mouth daily.    Marland Kitchen atorvastatin (LIPITOR) 40 MG tablet Take 1 tablet (40 mg total) by mouth daily. 30 tablet 10  . BRILINTA 60 MG TABS tablet Take 60 mg by mouth 2 (two) times daily.  1  . Cholecalciferol (VITAMIN D PO) Take 1 tablet by mouth daily.    . citalopram (CELEXA) 20 MG tablet Take 20 mg by mouth daily.    . cyclobenzaprine (FLEXERIL) 10 MG  tablet Take 1 tablet (10 mg total) by mouth 3 (three) times daily as needed for muscle spasms. 30 tablet 6  . folic acid (FOLVITE) 1 MG tablet Take 1 tablet (1 mg total) by mouth daily. 30 tablet 6  . iron polysaccharides (NIFEREX) 150 MG capsule Take 1 capsule (150 mg total) by mouth daily. 30 capsule 2  . isosorbide mononitrate (IMDUR) 60 MG 24 hr tablet Take 1 tablet (60 mg total) by mouth daily. 30 tablet 6  . lisinopril (PRINIVIL,ZESTRIL) 5 MG tablet take 1 tablet by mouth once daily 30 tablet 6  . MELATONIN PO Take 1 tablet by mouth daily as needed (sleep).    . Menthol, Topical Analgesic, (ICY HOT BACK EX) Apply 1 application topically daily as needed (on back).    . metoprolol succinate (TOPROL-XL) 50 MG 24 hr tablet take 1 tablet by mouth twice a day 60 tablet 6  . montelukast (SINGULAIR) 10 MG tablet Take 10 mg by mouth at bedtime.    . nitroGLYCERIN (NITROSTAT) 0.4 MG SL tablet Place 1 tablet (0.4 mg total) under the tongue every 5 (five) minutes as needed for chest pain. 25 tablet 5  . Omega-3 Fatty Acids (FISH OIL EXTRA STRENGTH PO) Take by mouth.    . pantoprazole (PROTONIX) 40 MG tablet take 1 tablet by mouth every morning 30 tablet 6   No current facility-administered medications for this visit.    Facility-Administered Medications Ordered in Other Visits  Medication Dose Route Frequency Provider Last Rate Last Dose  . 0.9 %  sodium chloride infusion   Intravenous Continuous Nada Libman, MD      . Chlorhexidine Gluconate Cloth 2 % PADS 6 each  6 each Topical Once Nada Libman, MD        REVIEW OF SYSTEMS:   [X]  denotes positive finding, [ ]  denotes negative finding Cardiac  Comments:  Chest pain or chest pressure: x   Shortness of breath upon exertion: x   Short of breath when lying flat: x   Irregular heart rhythm: x       Vascular    Pain in calf, thigh, or hip brought on by ambulation: x   Pain in feet at night that wakes you up from your sleep:     Blood  clot in your veins:    Leg swelling:         Pulmonary    Oxygen at home:    Productive cough:     Wheezing:         Neurologic    Sudden weakness in arms or legs:     Sudden numbness in arms or legs:  Sudden onset of difficulty speaking or slurred speech:    Temporary loss of vision in one eye:     Problems with dizziness:         Gastrointestinal    Blood in stool:     Vomited blood:         Genitourinary    Burning when urinating:     Blood in urine:        Psychiatric    Major depression:         Hematologic    Bleeding problems:    Problems with blood clotting too easily:        Skin    Rashes or ulcers:        Constitutional    Fever or chills:      PHYSICAL EXAM:   Vitals:   07/16/16 1228 07/16/16 1232  BP: (!) 177/97 (!) 161/102  Pulse: 82 72  Resp: 18   Temp: 97.4 F (36.3 C)   TempSrc: Oral   SpO2: 95%   Weight: 147 lb (66.7 kg)   Height: 5\' 4"  (1.626 m)     GENERAL: The patient is a well-nourished female, in no acute distress. The vital signs are documented above. CARDIAC: There is a regular rate and rhythm.  PULMONARY: Non-labored respirations ABDOMEN: Soft and non-tender with normal pitched bowel sounds.  MUSCULOSKELETAL: There are no major deformities or cyanosis. NEUROLOGIC: No focal weakness or paresthesias are detected. SKIN: There are no ulcers or rashes noted. PSYCHIATRIC: The patient has a normal affect.  STUDIES:   I have reviewed her CT scan with the following findings: Aorto bi-iliac stent graft remains patent. Overall diameter of the aorta which includes in the soft tissue rind or wall thickening has diminished from 5.7 cm to a maximal diameter of 5.5 cm. The mentioned soft tissue is nonspecific and may be related to vasculitis or retroperitoneal fibrosis. Of note, there is no evidence of hydronephrosis and no significant extension of soft tissue posterior to the aorta. Regression of soft tissues supports benign  etiology.  The right renal artery is occluded. Interval development of poor enhancement of the right kidney suggests that this is a new finding.  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 It does not appear flow limiting. 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  Her aneurysm sac continues to decrease.  She remains pain-free.  I will have her follow-up in 6 months with a repeat CT scan  Right iliac artery lesion: This potentially could be contributing to her symptoms.  I think it warrants angiography.  We will plan on doing this Tuesday, February 13 via a right femoral approach.      [name], MD [facility] [phone] Pager [phone]

## 2016-07-18 NOTE — Addendum Note (Signed)
Addended by: Burton Apley A on: 07/18/2016 12:00 PM   Modules accepted: Orders

## 2016-07-20 ENCOUNTER — Other Ambulatory Visit: Payer: Self-pay | Admitting: Cardiovascular Disease

## 2016-07-24 ENCOUNTER — Encounter (HOSPITAL_COMMUNITY)
Admission: RE | Disposition: A | Discharge status: Home / Self Care | Payer: Self-pay | Source: Ambulatory Visit | Attending: Surgery

## 2016-07-24 ENCOUNTER — Encounter (HOSPITAL_COMMUNITY): Payer: Self-pay | Admitting: Surgery

## 2016-07-24 ENCOUNTER — Ambulatory Visit (HOSPITAL_COMMUNITY)
Admission: RE | Admit: 2016-07-24 | Discharge: 2016-07-24 | Disposition: A | Discharge status: Home / Self Care | Payer: BLUE CROSS/BLUE SHIELD | Source: Ambulatory Visit | Attending: Surgery | Admitting: Surgery

## 2016-07-24 ENCOUNTER — Telehealth: Payer: Self-pay | Admitting: Surgery

## 2016-07-24 ENCOUNTER — Other Ambulatory Visit: Payer: Self-pay | Admitting: *Deleted

## 2016-07-24 DIAGNOSIS — Y831 Surgical operation with implant of artificial internal device as the cause of abnormal reaction of the patient, or of later complication, without mention of misadventure at the time of the procedure: Secondary | ICD-10-CM | POA: Insufficient documentation

## 2016-07-24 DIAGNOSIS — I10 Essential (primary) hypertension: Secondary | ICD-10-CM | POA: Insufficient documentation

## 2016-07-24 DIAGNOSIS — Z7982 Long term (current) use of aspirin: Secondary | ICD-10-CM | POA: Diagnosis not present

## 2016-07-24 DIAGNOSIS — Z87891 Personal history of nicotine dependence: Secondary | ICD-10-CM | POA: Insufficient documentation

## 2016-07-24 DIAGNOSIS — Z8249 Family history of ischemic heart disease and other diseases of the circulatory system: Secondary | ICD-10-CM | POA: Insufficient documentation

## 2016-07-24 DIAGNOSIS — I714 Abdominal aortic aneurysm, without rupture: Secondary | ICD-10-CM | POA: Insufficient documentation

## 2016-07-24 DIAGNOSIS — E785 Hyperlipidemia, unspecified: Secondary | ICD-10-CM | POA: Insufficient documentation

## 2016-07-24 DIAGNOSIS — I70211 Atherosclerosis of native arteries of extremities with intermittent claudication, right leg: Secondary | ICD-10-CM | POA: Insufficient documentation

## 2016-07-24 DIAGNOSIS — I2510 Atherosclerotic heart disease of native coronary artery without angina pectoris: Secondary | ICD-10-CM | POA: Insufficient documentation

## 2016-07-24 DIAGNOSIS — J449 Chronic obstructive pulmonary disease, unspecified: Secondary | ICD-10-CM | POA: Insufficient documentation

## 2016-07-24 DIAGNOSIS — I701 Atherosclerosis of renal artery: Secondary | ICD-10-CM

## 2016-07-24 DIAGNOSIS — M1990 Unspecified osteoarthritis, unspecified site: Secondary | ICD-10-CM | POA: Diagnosis not present

## 2016-07-24 DIAGNOSIS — Z885 Allergy status to narcotic agent status: Secondary | ICD-10-CM | POA: Diagnosis not present

## 2016-07-24 DIAGNOSIS — I252 Old myocardial infarction: Secondary | ICD-10-CM | POA: Diagnosis not present

## 2016-07-24 DIAGNOSIS — K219 Gastro-esophageal reflux disease without esophagitis: Secondary | ICD-10-CM | POA: Diagnosis not present

## 2016-07-24 DIAGNOSIS — G8929 Other chronic pain: Secondary | ICD-10-CM | POA: Diagnosis not present

## 2016-07-24 DIAGNOSIS — M545 Low back pain: Secondary | ICD-10-CM | POA: Diagnosis not present

## 2016-07-24 DIAGNOSIS — F329 Major depressive disorder, single episode, unspecified: Secondary | ICD-10-CM | POA: Insufficient documentation

## 2016-07-24 DIAGNOSIS — I739 Peripheral vascular disease, unspecified: Secondary | ICD-10-CM | POA: Diagnosis not present

## 2016-07-24 DIAGNOSIS — T82856A Stenosis of peripheral vascular stent, initial encounter: Secondary | ICD-10-CM | POA: Diagnosis not present

## 2016-07-24 DIAGNOSIS — F419 Anxiety disorder, unspecified: Secondary | ICD-10-CM | POA: Insufficient documentation

## 2016-07-24 DIAGNOSIS — Z880 Allergy status to penicillin: Secondary | ICD-10-CM | POA: Insufficient documentation

## 2016-07-24 DIAGNOSIS — Z9862 Peripheral vascular angioplasty status: Secondary | ICD-10-CM

## 2016-07-24 HISTORY — PX: ABDOMINAL AORTOGRAM: CATH118222

## 2016-07-24 HISTORY — PX: PERIPHERAL VASCULAR INTERVENTION: CATH118257

## 2016-07-24 LAB — POCT I-STAT, CHEM 8
BUN: 14 mg/dL (ref 6–20)
CHLORIDE: 107 mmol/L (ref 101–111)
CREATININE: 1.3 mg/dL — AB (ref 0.44–1.00)
Calcium, Ion: 1.25 mmol/L (ref 1.15–1.40)
Glucose, Bld: 85 mg/dL (ref 65–99)
HEMATOCRIT: 34 % — AB (ref 36.0–46.0)
Hemoglobin: 11.6 g/dL — ABNORMAL LOW (ref 12.0–15.0)
Potassium: 4.2 mmol/L (ref 3.5–5.1)
Sodium: 141 mmol/L (ref 135–145)
TCO2: 24 mmol/L (ref 0–100)

## 2016-07-24 LAB — POCT ACTIVATED CLOTTING TIME
ACTIVATED CLOTTING TIME: 197 s
ACTIVATED CLOTTING TIME: 175 s
Activated Clotting Time: 191 seconds

## 2016-07-24 SURGERY — ABDOMINAL AORTOGRAM
Laterality: Right

## 2016-07-24 MED ORDER — FENTANYL CITRATE (PF) 100 MCG/2ML IJ SOLN
INTRAMUSCULAR | Status: AC
  Filled 2016-07-24: qty 2

## 2016-07-24 MED ORDER — HYDROMORPHONE HCL 1 MG/ML IJ SOLN: 0.5000 mg | INTRAMUSCULAR | Status: DC | PRN

## 2016-07-24 MED ORDER — HEPARIN (PORCINE) IN NACL 2-0.9 UNIT/ML-% IJ SOLN
INTRAMUSCULAR | Status: DC | PRN
Start: 2016-07-24 — End: 2016-07-24
  Administered 2016-07-24: 1000 mL

## 2016-07-24 MED ORDER — SODIUM CHLORIDE 0.9 % IV SOLN: 1.0000 mL/kg/h | INTRAVENOUS | Status: DC

## 2016-07-24 MED ORDER — ONDANSETRON HCL 4 MG/2ML IJ SOLN
INTRAMUSCULAR | Status: DC | PRN
  Administered 2016-07-24: 4 mg via INTRAVENOUS

## 2016-07-24 MED ORDER — IODIXANOL 320 MG/ML IV SOLN
INTRAVENOUS | Status: DC | PRN
  Administered 2016-07-24: 125 mL via INTRAVENOUS

## 2016-07-24 MED ORDER — HEPARIN SODIUM (PORCINE) 1000 UNIT/ML IJ SOLN
INTRAMUSCULAR | Status: AC
  Filled 2016-07-24: qty 1

## 2016-07-24 MED ORDER — MIDAZOLAM HCL 2 MG/2ML IJ SOLN
INTRAMUSCULAR | Status: DC | PRN
  Administered 2016-07-24 (×3): 1 mg via INTRAVENOUS
  Administered 2016-07-24: 2 mg via INTRAVENOUS

## 2016-07-24 MED ORDER — LABETALOL HCL 5 MG/ML IV SOLN: 10.0000 mg | INTRAVENOUS | Status: DC | PRN

## 2016-07-24 MED ORDER — ACETAMINOPHEN 325 MG RE SUPP
325.0000 mg | RECTAL | Status: DC | PRN
  Filled 2016-07-24: qty 2

## 2016-07-24 MED ORDER — SODIUM CHLORIDE 0.9 % IV SOLN: INTRAVENOUS | Status: DC

## 2016-07-24 MED ORDER — ALUM & MAG HYDROXIDE-SIMETH 200-200-20 MG/5ML PO SUSP
15.0000 mL | ORAL | Status: DC | PRN
  Filled 2016-07-24: qty 30

## 2016-07-24 MED ORDER — PHENOL 1.4 % MT LIQD
1.0000 | OROMUCOSAL | Status: DC | PRN
Start: 2016-07-24 — End: 2016-07-24

## 2016-07-24 MED ORDER — MIDAZOLAM HCL 2 MG/2ML IJ SOLN
INTRAMUSCULAR | Status: AC
  Filled 2016-07-24: qty 2

## 2016-07-24 MED ORDER — METOPROLOL TARTRATE 5 MG/5ML IV SOLN: 2.0000 mg | INTRAVENOUS | Status: DC | PRN

## 2016-07-24 MED ORDER — ONDANSETRON HCL 4 MG/2ML IJ SOLN
INTRAMUSCULAR | Status: AC
  Filled 2016-07-24: qty 2

## 2016-07-24 MED ORDER — FENTANYL CITRATE (PF) 100 MCG/2ML IJ SOLN
INTRAMUSCULAR | Status: DC | PRN
  Administered 2016-07-24: 50 ug via INTRAVENOUS
  Administered 2016-07-24 (×3): 25 ug via INTRAVENOUS

## 2016-07-24 MED ORDER — HEPARIN SODIUM (PORCINE) 1000 UNIT/ML IJ SOLN
INTRAMUSCULAR | Status: DC | PRN
  Administered 2016-07-24: 1000 [IU] via INTRAVENOUS
  Administered 2016-07-24: 7000 [IU] via INTRAVENOUS

## 2016-07-24 MED ORDER — GUAIFENESIN-DM 100-10 MG/5ML PO SYRP
15.0000 mL | ORAL_SOLUTION | ORAL | Status: DC | PRN
  Filled 2016-07-24: qty 15

## 2016-07-24 MED ORDER — DOCUSATE SODIUM 100 MG PO CAPS: 100.0000 mg | ORAL_CAPSULE | Freq: Every day | ORAL | Status: DC

## 2016-07-24 MED ORDER — ONDANSETRON HCL 4 MG/2ML IJ SOLN: 4.0000 mg | Freq: Four times a day (QID) | INTRAMUSCULAR | Status: DC | PRN

## 2016-07-24 MED ORDER — LIDOCAINE HCL (PF) 1 % IJ SOLN
INTRAMUSCULAR | Status: AC
  Filled 2016-07-24: qty 30

## 2016-07-24 MED ORDER — OXYCODONE HCL 5 MG PO TABS
5.0000 mg | ORAL_TABLET | ORAL | Status: DC | PRN
  Administered 2016-07-24: 10 mg via ORAL
  Filled 2016-07-24: qty 2

## 2016-07-24 MED ORDER — LIDOCAINE HCL (PF) 1 % IJ SOLN
INTRAMUSCULAR | Status: DC | PRN
  Administered 2016-07-24: 16 mL

## 2016-07-24 MED ORDER — HEPARIN (PORCINE) IN NACL 2-0.9 UNIT/ML-% IJ SOLN
INTRAMUSCULAR | Status: AC
  Filled 2016-07-24: qty 1000

## 2016-07-24 MED ORDER — ACETAMINOPHEN 325 MG PO TABS
325.0000 mg | ORAL_TABLET | ORAL | Status: DC | PRN
Start: 2016-07-24 — End: 2016-07-24
  Filled 2016-07-24: qty 2

## 2016-07-24 MED ORDER — SODIUM CHLORIDE 0.9 % IV SOLN
INTRAVENOUS | Status: DC
  Administered 2016-07-24: 07:00:00 via INTRAVENOUS

## 2016-07-24 MED ORDER — FENTANYL CITRATE (PF) 100 MCG/2ML IJ SOLN
25.0000 ug | INTRAMUSCULAR | Status: DC | PRN
  Administered 2016-07-24: 25 ug via INTRAVENOUS

## 2016-07-24 MED ORDER — OXYCODONE HCL 5 MG PO TABS
ORAL_TABLET | ORAL | Status: AC
  Filled 2016-07-24: qty 2

## 2016-07-24 MED ORDER — HYDRALAZINE HCL 20 MG/ML IJ SOLN: 5.0000 mg | INTRAMUSCULAR | Status: DC | PRN

## 2016-07-24 SURGICAL SUPPLY — 33 items
BALLN MUSTANG 6X60X75 (BALLOONS) ×4
BALLN VIATRAC 6X15X135 (BALLOONS) ×4
BALLN VIATRAC 7X15X135 (BALLOONS) ×4
BALLOON MUSTANG 6X60X75 (BALLOONS) IMPLANT
BALLOON VIATRAC 6X15X135 (BALLOONS) IMPLANT
BALLOON VIATRAC 7X15X135 (BALLOONS) IMPLANT
CATH ANGIO 5F BER2 65CM (CATHETERS) ×2 IMPLANT
CATH CROSS OVER TEMPO 5F (CATHETERS) ×2 IMPLANT
CATH OMNI FLUSH 5F 65CM (CATHETERS) ×2 IMPLANT
COVER PRB 48X5XTLSCP FOLD TPE (BAG) IMPLANT
COVER PROBE 5X48 (BAG) ×4
DEVICE CONTINUOUS FLUSH (MISCELLANEOUS) ×2 IMPLANT
DEVICE TORQUE H2O (MISCELLANEOUS) ×2 IMPLANT
DRAPE ZERO GRAVITY STERILE (DRAPES) ×2 IMPLANT
GUIDE CATH VISTA IMA 6F (CATHETERS) ×2 IMPLANT
GUIDEWIRE ANGLED .035X150CM (WIRE) ×2 IMPLANT
KIT ENCORE 26 ADVANTAGE (KITS) ×2 IMPLANT
KIT PV (KITS) ×4 IMPLANT
SHEATH FLEXOR ANSEL 1 7F 45CM (SHEATH) ×2 IMPLANT
SHEATH PINNACLE 5F 10CM (SHEATH) ×2 IMPLANT
SHEATH PINNACLE 7F 10CM (SHEATH) ×2 IMPLANT
SHIELD RADPAD SCOOP 12X17 (MISCELLANEOUS) ×2 IMPLANT
STENT HERCULINK RX 6.0X12X135 (Permanent Stent) ×2 IMPLANT
STENT INNOVA 7X60X130 (Permanent Stent) ×2 IMPLANT
STOPCOCK MORSE 400PSI 3WAY (MISCELLANEOUS) ×2 IMPLANT
SYR MEDRAD MARK V 150ML (SYRINGE) ×4 IMPLANT
TRANSDUCER W/STOPCOCK (MISCELLANEOUS) ×4 IMPLANT
TRAY PV CATH (CUSTOM PROCEDURE TRAY) ×4 IMPLANT
TUBING CIL FLEX 10 FLL-RA (TUBING) ×2 IMPLANT
WIRE BENTSON .035X145CM (WIRE) ×2 IMPLANT
WIRE HI TORQ VERSACORE J 260CM (WIRE) ×2 IMPLANT
WIRE MINI STICK MAX (SHEATH) ×2 IMPLANT
WIRE SPARTACORE .014X190CM (WIRE) ×2 IMPLANT

## 2016-07-24 NOTE — Interval H&P Note (Signed)
History and Physical Interval Note:  07/24/2016 7:30 AM  Olivia Wade  has presented today for surgery, with the diagnosis of right lower extremity pain  The various methods of treatment have been discussed with the patient and family. After consideration of risks, benefits and other options for treatment, the patient has consented to  Procedure(s): Abdominal Aortogram w/Lower Extremity (N/A) as a surgical intervention .  The patient's history has been reviewed, patient examined, no change in status, stable for surgery.  I have reviewed the patient's chart and labs.  Questions were answered to the patient's satisfaction.     [name]

## 2016-07-24 NOTE — Discharge Instructions (Signed)
Angiogram, Care After °These instructions give you information about caring for yourself after your procedure. Your doctor may also give you more specific instructions. Call your doctor if you have any problems or questions after your procedure. °Follow these instructions at home: °· Take medicines only as told by your doctor. °· Follow your doctor's instructions about: °¨ Care of the area where the tube was inserted. °¨ Bandage (dressing) changes and removal. °· You may shower 24-48 hours after the procedure or as told by your doctor. °· Do not take baths, swim, or use a hot tub until your doctor approves. °· Every day, check the area where the tube was inserted. Watch for: °¨ Redness, swelling, or pain. °¨ Fluid, blood, or pus. °· Do not apply powder or lotion to the site. °· Do not lift anything that is heavier than 10 lb (4.5 kg) for 5 days or as told by your doctor. °· Ask your doctor when you can: °¨ Return to work or school. °¨ Do physical activities or play sports. °¨ Have sex. °· Do not drive or operate heavy machinery for 24 hours or as told by your doctor. °· Have someone with you for the first 24 hours after the procedure. °· Keep all follow-up visits as told by your doctor. This is important. °Contact a health care provider if: °· You have a fever. °· You have chills. °· You have more bleeding from the area where the tube was inserted. Hold pressure on the area. °· You have redness, swelling, or pain in the area where the tube was inserted. °· You have fluid or pus coming from the area. °Get help right away if: °· You have a lot of pain in the area where the tube was inserted. °· The area where the tube was inserted is bleeding, and the bleeding does not stop after 30 minutes of holding steady pressure on the area. °· The area near or just beyond the insertion site becomes pale, cool, tingly, or numb. °This information is not intended to replace advice given to you by your health care provider. Make  sure you discuss any questions you have with your health care provider. °Document Released: 08/24/2008 Document Revised: 11/03/2015 Document Reviewed: 10/29/2012 °Elsevier Interactive Patient Education © 2017 [employer] ° °

## 2016-07-24 NOTE — Progress Notes (Signed)
Site area: right groin Site Prior to Removal:  Level 0 Pressure Applied For: 20 minutes Manual:   yes Patient Status During Pull:  stable Post Pull Site:  Level  0 Post Pull Instructions Given:  yes Post Pull Pulses Present: dopplered Dressing Applied:  yes Bedrest begins @ 1030 Comments:

## 2016-07-24 NOTE — H&P (View-Only) (Signed)
[facility] [place]  Patient name: Olivia Wade MRN: [id number] DOB: 06/24/1950 Sex: female   REASON FOR VISIT:    Follow up FEVAR  HISOTRY OF PRESENT ILLNESS:   Olivia Wade is a 66 y.o. 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  She had stenting to bilateral renal arteries and a scallop around her superior mesenteric artery.  The following month she presented with flank and back pain and was found to have an occluded right renal artery stent.  She recovered from this and her pain improved.  Her overall abdominal pain has also improved.  She does complain of some difficulty and cramping in her right leg which is exacerbated by walking.  It mainly affects her thigh.   PAST MEDICAL HISTORY:   Past Medical History:  Diagnosis Date  . AAA (abdominal aortic aneurysm) (HCC)   . Anxiety    Panic attacks  . Arthritis   . Asthma   . Chronic low back pain    (Old vertebral fracture)  . Complication of anesthesia   . COPD (chronic obstructive pulmonary disease) (HCC)   . Coronary artery disease 09/08/13   with PCI  . Depression   . Family history of adverse reaction to anesthesia     mother - slow to awaken  . GERD (gastroesophageal reflux disease)   . Heart palpitations    prn toprol for rapid HR  . History of surgery on arm   . HLD (hyperlipidemia)   . HTN (hypertension)   . NSTEMI (non-ST elevated myocardial infarction) (HCC) 09/08/13  . PONV (postoperative nausea and vomiting)   . Renal disorder    ischemic kidney   . Tobacco abuse      FAMILY HISTORY:   Family History  Problem Relation Age of Onset  . Heart attack Father   . Cancer Father     Esophageal  . Heart disease Father     before age 55  . Hypertension Mother   . Heart attack Son 13  . Heart disease Son     before age 18    SOCIAL HISTORY:    Social History  Substance Use Topics  . Smoking status: Former Smoker    Packs/day: 0.50    Years: 37.00    Types: Cigarettes    Quit date: 09/08/2013  . Smokeless tobacco: Never Used  . Alcohol use No     ALLERGIES:   Allergies  Allergen Reactions  . Bee Venom Anaphylaxis, Hives and Swelling  . Hydrocodone Other (See Comments)  . Penicillins Other (See Comments)    PATIENT REPORTS "MOTHER TOLD HER SHE HAD ALLERGY TO PCN"  UNKNOWN REACTION TO PCN PCN reaction causing immediate rash, facial/tongue/throat swelling, SOB or lightheadedness with hypotension: unknown PCN reaction causing severe rash involving mucus membranes or skin necrosis: unknown PCN reaction that required hospitalization unknown PCN reaction occurring within the last 10 years: unknown  . Morphine And Related Rash     CURRENT MEDICATIONS:   Current Outpatient Prescriptions  Medication Sig Dispense Refill  . acetaminophen (TYLENOL) 325 MG tablet Take 325 mg by mouth every 6 (six) hours as needed for moderate pain.    Marland Kitchen albuterol (PROVENTIL HFA;VENTOLIN HFA) 108 (90 BASE) MCG/ACT inhaler Inhale 1 puff into the lungs every 6 (six) hours as needed for wheezing or shortness of breath.    Marland Kitchen albuterol (PROVENTIL) (2.5 MG/3ML) 0.083% nebulizer solution Take 2.5  mg by nebulization every 6 (six) hours as needed for wheezing or shortness of breath.    . ALPRAZolam (XANAX) 0.5 MG tablet Take 0.5 mg by mouth 3 (three) times daily as needed for anxiety or sleep. *Patient is prescribed one tablets three times daily as needed for anxiety    . aspirin 81 MG tablet Take 81 mg by mouth daily.    Marland Kitchen atorvastatin (LIPITOR) 40 MG tablet Take 1 tablet (40 mg total) by mouth daily. 30 tablet 10  . BRILINTA 60 MG TABS tablet Take 60 mg by mouth 2 (two) times daily.  1  . Cholecalciferol (VITAMIN D PO) Take 1 tablet by mouth daily.    . citalopram (CELEXA) 20 MG tablet Take 20 mg by mouth daily.    . cyclobenzaprine (FLEXERIL) 10 MG  tablet Take 1 tablet (10 mg total) by mouth 3 (three) times daily as needed for muscle spasms. 30 tablet 6  . folic acid (FOLVITE) 1 MG tablet Take 1 tablet (1 mg total) by mouth daily. 30 tablet 6  . iron polysaccharides (NIFEREX) 150 MG capsule Take 1 capsule (150 mg total) by mouth daily. 30 capsule 2  . isosorbide mononitrate (IMDUR) 60 MG 24 hr tablet Take 1 tablet (60 mg total) by mouth daily. 30 tablet 6  . lisinopril (PRINIVIL,ZESTRIL) 5 MG tablet take 1 tablet by mouth once daily 30 tablet 6  . MELATONIN PO Take 1 tablet by mouth daily as needed (sleep).    . Menthol, Topical Analgesic, (ICY HOT BACK EX) Apply 1 application topically daily as needed (on back).    . metoprolol succinate (TOPROL-XL) 50 MG 24 hr tablet take 1 tablet by mouth twice a day 60 tablet 6  . montelukast (SINGULAIR) 10 MG tablet Take 10 mg by mouth at bedtime.    . nitroGLYCERIN (NITROSTAT) 0.4 MG SL tablet Place 1 tablet (0.4 mg total) under the tongue every 5 (five) minutes as needed for chest pain. 25 tablet 5  . Omega-3 Fatty Acids (FISH OIL EXTRA STRENGTH PO) Take by mouth.    . pantoprazole (PROTONIX) 40 MG tablet take 1 tablet by mouth every morning 30 tablet 6   No current facility-administered medications for this visit.    Facility-Administered Medications Ordered in Other Visits  Medication Dose Route Frequency Provider Last Rate Last Dose  . 0.9 %  sodium chloride infusion   Intravenous Continuous Nada Libman, MD      . Chlorhexidine Gluconate Cloth 2 % PADS 6 each  6 each Topical Once Nada Libman, MD        REVIEW OF SYSTEMS:   [X]  denotes positive finding, [ ]  denotes negative finding Cardiac  Comments:  Chest pain or chest pressure: x   Shortness of breath upon exertion: x   Short of breath when lying flat: x   Irregular heart rhythm: x       Vascular    Pain in calf, thigh, or hip brought on by ambulation: x   Pain in feet at night that wakes you up from your sleep:     Blood  clot in your veins:    Leg swelling:         Pulmonary    Oxygen at home:    Productive cough:     Wheezing:         Neurologic    Sudden weakness in arms or legs:     Sudden numbness in arms or legs:  Sudden onset of difficulty speaking or slurred speech:    Temporary loss of vision in one eye:     Problems with dizziness:         Gastrointestinal    Blood in stool:     Vomited blood:         Genitourinary    Burning when urinating:     Blood in urine:        Psychiatric    Major depression:         Hematologic    Bleeding problems:    Problems with blood clotting too easily:        Skin    Rashes or ulcers:        Constitutional    Fever or chills:      PHYSICAL EXAM:   Vitals:   07/16/16 1228 07/16/16 1232  BP: (!) 177/97 (!) 161/102  Pulse: 82 72  Resp: 18   Temp: 97.4 F (36.3 C)   TempSrc: Oral   SpO2: 95%   Weight: 147 lb (66.7 kg)   Height: 5\' 4"  (1.626 m)     GENERAL: The patient is a well-nourished female, in no acute distress. The vital signs are documented above. CARDIAC: There is a regular rate and rhythm.  PULMONARY: Non-labored respirations ABDOMEN: Soft and non-tender with normal pitched bowel sounds.  MUSCULOSKELETAL: There are no major deformities or cyanosis. NEUROLOGIC: No focal weakness or paresthesias are detected. SKIN: There are no ulcers or rashes noted. PSYCHIATRIC: The patient has a normal affect.  STUDIES:   I have reviewed her CT scan with the following findings: Aorto bi-iliac stent graft remains patent. Overall diameter of the aorta which includes in the soft tissue rind or wall thickening has diminished from 5.7 cm to a maximal diameter of 5.5 cm. The mentioned soft tissue is nonspecific and may be related to vasculitis or retroperitoneal fibrosis. Of note, there is no evidence of hydronephrosis and no significant extension of soft tissue posterior to the aorta. Regression of soft tissues supports benign  etiology.  The right renal artery is occluded. Interval development of poor enhancement of the right kidney suggests that this is a new finding.  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 It does not appear flow limiting. 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  Her aneurysm sac continues to decrease.  She remains pain-free.  I will have her follow-up in 6 months with a repeat CT scan  Right iliac artery lesion: This potentially could be contributing to her symptoms.  I think it warrants angiography.  We will plan on doing this Tuesday, February 13 via a right femoral approach.      [name], MD [facility] [phone] Pager [phone]

## 2016-07-24 NOTE — Telephone Encounter (Signed)
-----   Message from Sharee Pimple, RN sent at 07/24/2016 10:32 AM EST ----- Regarding: 1 month w/ labs   ----- Message ----- From: Nada Libman, MD Sent: 07/24/2016   9:27 AM To: Vvs Charge Pool  07/24/2016:  Surgeon:  [name] Procedure Performed:  1.  Ultrasound-guided access, right femoral artery  2.  Abdominal aortogram  3.  Stent, right external iliac artery  4.  Stent, left renal artery following primary balloon angioplasty  5.  Sedation (98 minutes)  Follow-up one month with a renal artery duplex on the left and aortoiliac duplex and ABIs.  To see me

## 2016-07-24 NOTE — Op Note (Signed)
Patient name: RAYNAH GOMES MRN: [id number] DOB: [birth year] Sex: female  07/24/2016 Pre-operative Diagnosis: Right buttock claudication Post-operative diagnosis:  Same Surgeon:  Annamarie Major Procedure Performed:  1.  Ultrasound-guided access, right femoral artery  2.  Abdominal aortogram  3.  Stent, right external iliac artery  4.  Stent, left renal artery following primary balloon angioplasty  5.  Sedation (98 minutes)    Indications:  The patient has a history of a fenestrated aortic graft.  Her right renal artery occluded several months after her procedure.  She has been having right buttock claudication.  On CT scan there was a small dissection identified in the right external iliac artery.  She comes in today for further evaluation and possible intervention.  Procedure:  The patient was identified in the holding area and taken to room 8.  The patient was then placed supine on the table and prepped and draped in the usual sterile fashion.  A time out was called.  Conscious sedation was administered with the use of IV fentanyl and Versed under continuous physician and nurse monitoring.  Heart rate, blood pressure, and oxygen saturations were continuously monitored.  Ultrasound was used to evaluate the right common femoral artery.  It was patent .  A digital ultrasound image was acquired.  A micropuncture needle was used to access the right common femoral artery under ultrasound guidance.  An 018 wire was advanced without resistance and a micropuncture sheath was placed.  The 018 wire was removed and a benson wire was placed.  The micropuncture sheath was exchanged for a 5 french sheath.  An omniflush catheter was advanced over the wire to the level of L-1.  An abdominal angiogram was obtained.  Next, the catheter was pulled down to the graft bifurcation and angiography including the femoral head was performed.  Findings:   Aortogram:  The right renal stent is occluded.  The left renal  stent is patent, however just beyond its distal point, there is approximately a 70% stenosis.  The main body of the graft is widely patent.  There is tortuosity within the right common iliac portion of the graft but it appears patent without significant stenosis.  The left common external iliac artery are widely patent.  There is contrast that holds up in the right external iliac artery, consistent with where the dissection was identified on CT scan.    Intervention:  At this point, I decided to proceed with intervention.  Over an 035 wire, a 7 French 45 cm antral 1 sheath was inserted.  The patient was fully heparinized.  I elected to primarily stent the right external iliac artery.  I selected a 7 x 60 INNOVA balloon expandable stent and deployed this landing at the level of the hypogastric artery and taking it down to within 2 cm of the femoral head.  It was postdilated with a 6 mm balloon.  Completion imaging showed resolution of the dissection.  Next attention was turned towards the left renal artery.  This was very difficult to cannulate.  I was ultimately successful with a IM guide catheter and a [place] core wire.  My plan was to perform balloon angioplasty beyond the stent.  This was done with a 6 x 15 balloon.  It was taken to 10 atm for 2 minutes.  Completion imaging revealed a non-flow-limiting dissection along the inferior aspect of the artery.  Therefore, I elected to treat this with stenting.  A 6 x 12 Herculink stent  was inserted and then deployed.  Completion imaging revealed resolution of the dissection, however I felt that the stent was not fully opposed to the previous stent and therefore I inserted a 7 mm balloon and took this to 10 atm which provided better stent apposition.  The final completion run showed a widely patent left renal artery.  At this point the decision was made to terminate the procedure.  Catheters and wires were removed.  The sheath was withdrawn to the right iliac  artery.  Patient taken the holding area for sheath pull once her coagulation profile corrects.  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  This was initially treated with balloon angioplasty with a non-flow-limiting dissection on the inferior aspect which was subsequently treated with a 6 x 12 Herculink balloon expandable stent.  This was postdilated with a 7 mm balloon  #3  occluded right renal artery.   Theotis Burrow, M.D. [facility] [place] Office: [phone] Pager:  [phone]

## 2016-07-24 NOTE — Telephone Encounter (Signed)
left part of a messag, VM cut off before I could finish, mailing letter for appts on 3/16 for 3 [place] and 3/21 for post op

## 2016-08-17 ENCOUNTER — Encounter: Payer: Self-pay | Admitting: Surgery

## 2016-08-22 ENCOUNTER — Other Ambulatory Visit: Payer: Self-pay | Admitting: Cardiovascular Disease

## 2016-08-24 ENCOUNTER — Ambulatory Visit (INDEPENDENT_AMBULATORY_CARE_PROVIDER_SITE_OTHER)
Admission: RE | Admit: 2016-08-24 | Discharge: 2016-08-24 | Disposition: A | Discharge status: Home / Self Care | Payer: BLUE CROSS/BLUE SHIELD | Source: Ambulatory Visit | Attending: Surgery | Admitting: Surgery

## 2016-08-24 ENCOUNTER — Ambulatory Visit (HOSPITAL_COMMUNITY)
Admission: RE | Admit: 2016-08-24 | Discharge: 2016-08-24 | Disposition: A | Discharge status: Home / Self Care | Payer: BLUE CROSS/BLUE SHIELD | Source: Ambulatory Visit | Attending: Surgery | Admitting: Surgery

## 2016-08-24 ENCOUNTER — Ambulatory Visit (HOSPITAL_COMMUNITY)
Admission: RE | Admit: 2016-08-24 | Discharge: 2016-08-24 | Disposition: A | Discharge status: Home / Self Care | Payer: BLUE CROSS/BLUE SHIELD | Source: Ambulatory Visit | Attending: Vascular Surgery | Admitting: Vascular Surgery

## 2016-08-24 DIAGNOSIS — I739 Peripheral vascular disease, unspecified: Secondary | ICD-10-CM

## 2016-08-24 DIAGNOSIS — Z9862 Peripheral vascular angioplasty status: Secondary | ICD-10-CM

## 2016-08-24 DIAGNOSIS — I701 Atherosclerosis of renal artery: Secondary | ICD-10-CM

## 2016-08-29 ENCOUNTER — Ambulatory Visit (INDEPENDENT_AMBULATORY_CARE_PROVIDER_SITE_OTHER): Payer: BLUE CROSS/BLUE SHIELD | Admitting: Surgery

## 2016-08-29 ENCOUNTER — Encounter: Payer: Self-pay | Admitting: Surgery

## 2016-08-29 VITALS — BP 133/81 | HR 80 | Temp 98.2°F | Resp 16 | Ht 64.0 in | Wt 150.0 lb

## 2016-08-29 DIAGNOSIS — I714 Abdominal aortic aneurysm, without rupture, unspecified: Secondary | ICD-10-CM

## 2016-08-29 NOTE — Progress Notes (Signed)
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 She had stenting to bilateral renal arteries and a scallop around her superior mesenteric artery.  The following month she presented with flank and back pain and was found to have an occluded right renal artery stent.  She recovered from this and her pain improved.  Her overall abdominal pain has also improved.  She began complaining of right hip and thigh pain.  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  Therefore the patient was scheduled for angiography.  This was performed on 07/24/2016.  Contrast was visualized hanging up in the right external iliac artery which was the level of the dissection and therefore this was stented using a 7 x 60 self-expanding stent.  I also identified a 70% stenosis beyond the left renal artery stent.  This was initially treated with balloon angioplasty.  There was a nonflow limiting dissection and therefore a 6 x 12 balloon expandable stent was placed and postdilated with a 7 mm balloon.  The patient is back today for follow-up.  She does not have any more right leg pain, however she is complaining of pain in her left leg which she states starts in her hip and goes down to her foot.  Her abdominal pain remained stable and very mild.  PAST MEDICAL HISTORY:   Past Medical History:  Diagnosis Date  . AAA (abdominal aortic aneurysm) (HCC)   . Anxiety    Panic attacks  . Arthritis   . Asthma   . Chronic low back pain    (Old vertebral fracture)  . Complication of anesthesia   . COPD (chronic obstructive pulmonary disease) (HCC)   .  Coronary artery disease 09/08/13   with PCI  . Depression   . Family history of adverse reaction to anesthesia     mother - slow to awaken  . GERD (gastroesophageal reflux disease)   . Heart palpitations    prn toprol for rapid HR  . History of surgery on arm   . HLD (hyperlipidemia)   . HTN (hypertension)   . NSTEMI (non-ST elevated myocardial infarction) (HCC) 09/08/13  . PONV (postoperative nausea and vomiting)   . Renal disorder    ischemic kidney   . Tobacco abuse      FAMILY HISTORY:   Family History  Problem Relation Age of Onset  . Heart attack Father   . Cancer Father     Esophageal  . Heart disease Father     before age 80  . Hypertension Mother   . Heart attack Son 44  . Heart disease Son     before age 53    SOCIAL HISTORY:   Social History  Substance Use Topics  . Smoking status: Former Smoker    Packs/day: 0.50    Years: 37.00    Types: Cigarettes    Quit date: 09/08/2013  . Smokeless tobacco: Never Used  . Alcohol use No     ALLERGIES:   Allergies  Allergen Reactions  . Bee Venom Anaphylaxis, Hives and Swelling  . Hydrocodone Other (See Comments)  . Penicillins Other (See Comments)    PATIENT REPORTS "MOTHER TOLD HER SHE  HAD ALLERGY TO PCN"  UNKNOWN REACTION TO PCN PCN reaction causing immediate rash, facial/tongue/throat swelling, SOB or lightheadedness with hypotension: unknown PCN reaction causing severe rash involving mucus membranes or skin necrosis: unknown PCN reaction that required hospitalization unknown PCN reaction occurring within the last 10 years: unknown  . Morphine And Related Rash     CURRENT MEDICATIONS:   Current Outpatient Prescriptions  Medication Sig Dispense Refill  . acetaminophen (TYLENOL) 325 MG tablet Take 325 mg by mouth every 6 (six) hours as needed for moderate pain.    Marland Kitchen albuterol (PROVENTIL HFA;VENTOLIN HFA) 108 (90 BASE) MCG/ACT inhaler Inhale 1 puff into the lungs every 6 (six) hours as needed for  wheezing or shortness of breath.    Marland Kitchen albuterol (PROVENTIL) (2.5 MG/3ML) 0.083% nebulizer solution Take 2.5 mg by nebulization every 6 (six) hours as needed for wheezing or shortness of breath.    . ALPRAZolam (XANAX) 0.5 MG tablet Take 0.5 mg by mouth 3 (three) times daily as needed for anxiety or sleep. *Patient is prescribed one tablets three times daily as needed for anxiety    . aspirin 81 MG tablet Take 81 mg by mouth daily.    Marland Kitchen atorvastatin (LIPITOR) 40 MG tablet Take 1 tablet (40 mg total) by mouth daily. 30 tablet 10  . BRILINTA 60 MG TABS tablet Take 60 mg by mouth 2 (two) times daily.  1  . cholecalciferol (VITAMIN D) 1000 units tablet Take 1 tablet by mouth daily.    . citalopram (CELEXA) 20 MG tablet Take 20 mg by mouth daily.    . cyclobenzaprine (FLEXERIL) 10 MG tablet Take 1 tablet (10 mg total) by mouth 3 (three) times daily as needed for muscle spasms. 30 tablet 6  . folic acid (FOLVITE) 1 MG tablet take 1 tablet by mouth once daily 30 tablet 6  . iron polysaccharides (NIFEREX) 150 MG capsule Take 1 capsule (150 mg total) by mouth daily. 30 capsule 2  . isosorbide mononitrate (IMDUR) 60 MG 24 hr tablet Take 1 tablet (60 mg total) by mouth daily. 30 tablet 6  . lisinopril (PRINIVIL,ZESTRIL) 5 MG tablet take 1 tablet by mouth once daily 30 tablet 6  . Melatonin 10 MG TABS Take 1 tablet by mouth at bedtime as needed.    . Menthol, Topical Analgesic, (ICY HOT BACK EX) Apply 1 application topically daily as needed (on back).    . metoprolol succinate (TOPROL-XL) 50 MG 24 hr tablet take 1 tablet by mouth twice a day 60 tablet 6  . montelukast (SINGULAIR) 10 MG tablet Take 10 mg by mouth at bedtime.    . nitroGLYCERIN (NITROSTAT) 0.4 MG SL tablet place 1 tablet under the tongue if needed every 5 minutes fo 25 tablet 5  . pantoprazole (PROTONIX) 40 MG tablet take 1 tablet by mouth every morning 30 tablet 6   No current facility-administered medications for this visit.     Facility-Administered Medications Ordered in Other Visits  Medication Dose Route Frequency Provider Last Rate Last Dose  . 0.9 %  sodium chloride infusion   Intravenous Continuous Olivia Libman, MD      . Chlorhexidine Gluconate Cloth 2 % PADS 6 each  6 each Topical Once Olivia Libman, MD        REVIEW OF SYSTEMS:   [X]  denotes positive finding, [ ]  denotes negative finding Cardiac  Comments:  Chest pain or chest pressure:    Shortness of breath upon exertion:    Short  of breath when lying flat:    Irregular heart rhythm:        Vascular    Pain in calf, thigh, or hip brought on by ambulation: x   Pain in feet at night that wakes you up from your sleep:     Blood clot in your veins:    Leg swelling:         Pulmonary    Oxygen at home:    Productive cough:     Wheezing:         Neurologic    Sudden weakness in arms or legs:     Sudden numbness in arms or legs:     Sudden onset of difficulty speaking or slurred speech:    Temporary loss of vision in one eye:     Problems with dizziness:         Gastrointestinal    Blood in stool:     Vomited blood:         Genitourinary    Burning when urinating:     Blood in urine:        Psychiatric    Major depression:         Hematologic    Bleeding problems:    Problems with blood clotting too easily:        Skin    Rashes or ulcers:        Constitutional    Fever or chills:      PHYSICAL EXAM:   There were no vitals filed for this visit.  GENERAL: The patient is a well-nourished female, in no acute distress. The vital signs are documented above. CARDIAC: There is a regular rate and rhythm.  VASCULAR: Palpable dorsalis pedis pulse bilaterally PULMONARY: Non-labored respirations ABDOMEN: Soft and non-tender with normal pitched bowel sounds.  MUSCULOSKELETAL: There are no major deformities or cyanosis. NEUROLOGIC: No focal weakness or paresthesias are detected. SKIN: There are no ulcers or rashes  noted. PSYCHIATRIC: The patient has a normal affect.  STUDIES:   I have reviewed the following vascular lab studies.  ABI: 1.01 on the right, 1.03 on the left.  All with triphasic waveforms Aortic duplex: Technically limited because of artifact.  The right external iliac stent appears patent however there may be a stenosis distally.  Aorta measures 5.19 x 5.17  MEDICAL ISSUES:   Status post FEVAR:  We did not get a great evaluation today with ultrasound because of overlying bowel gas.  The patient appears to be stable.  No acute issues were identified.  I'm going to bring her back in 6 months with a CT angiogram so that I can better evaluate her stent graft repair as well as a lesion in her right external iliac.  She will continue to take her aspirin and Plavix.    [name], MD [facility] [phone] Pager [phone]

## 2016-08-29 NOTE — Progress Notes (Signed)
Vitals:   08/29/16 1323  BP: (!) 145/84  Pulse: 80  Resp: 16  Temp: 98.2 F (36.8 C)  SpO2: 98%  Weight: 150 lb (68 kg)  Height: 5\' 4"  (1.626 m)

## 2016-09-15 ENCOUNTER — Other Ambulatory Visit: Payer: Self-pay | Admitting: Cardiovascular Disease

## 2016-09-29 ENCOUNTER — Other Ambulatory Visit: Payer: Self-pay | Admitting: Cardiovascular Disease

## 2016-10-24 ENCOUNTER — Emergency Department (HOSPITAL_COMMUNITY)
Admission: EM | Admit: 2016-10-24 | Discharge: 2016-10-24 | Disposition: A | Discharge status: Home / Self Care | Payer: BLUE CROSS/BLUE SHIELD | Attending: Emergency Medicine | Admitting: Emergency Medicine

## 2016-10-24 ENCOUNTER — Encounter (HOSPITAL_COMMUNITY): Payer: Self-pay | Admitting: Emergency Medicine

## 2016-10-24 DIAGNOSIS — Z87891 Personal history of nicotine dependence: Secondary | ICD-10-CM | POA: Diagnosis not present

## 2016-10-24 DIAGNOSIS — J449 Chronic obstructive pulmonary disease, unspecified: Secondary | ICD-10-CM | POA: Diagnosis not present

## 2016-10-24 DIAGNOSIS — J45909 Unspecified asthma, uncomplicated: Secondary | ICD-10-CM | POA: Insufficient documentation

## 2016-10-24 DIAGNOSIS — Z79899 Other long term (current) drug therapy: Secondary | ICD-10-CM | POA: Insufficient documentation

## 2016-10-24 DIAGNOSIS — Z7982 Long term (current) use of aspirin: Secondary | ICD-10-CM | POA: Insufficient documentation

## 2016-10-24 DIAGNOSIS — I2510 Atherosclerotic heart disease of native coronary artery without angina pectoris: Secondary | ICD-10-CM | POA: Diagnosis not present

## 2016-10-24 DIAGNOSIS — I10 Essential (primary) hypertension: Secondary | ICD-10-CM | POA: Diagnosis not present

## 2016-10-24 DIAGNOSIS — G8929 Other chronic pain: Secondary | ICD-10-CM

## 2016-10-24 DIAGNOSIS — M545 Low back pain, unspecified: Secondary | ICD-10-CM

## 2016-10-24 MED ORDER — KETOROLAC TROMETHAMINE 60 MG/2ML IM SOLN
60.0000 mg | Freq: Once | INTRAMUSCULAR | Status: AC
  Administered 2016-10-24: 60 mg via INTRAMUSCULAR
  Filled 2016-10-24: qty 2

## 2016-10-24 MED ORDER — METHOCARBAMOL 500 MG PO TABS: 500.0000 mg | ORAL_TABLET | Freq: Two times a day (BID) | ORAL | 0 refills | Status: AC

## 2016-10-24 MED ORDER — DICLOFENAC SODIUM 1 % TD GEL: 4.0000 g | Freq: Four times a day (QID) | TRANSDERMAL | 0 refills | Status: DC

## 2016-10-24 NOTE — ED Triage Notes (Signed)
Pt states she has chronic low back pain, denies new injury or activity. Pain increased in severity yesterday, took Flexaril PTA without relief. Denies GU/GI sx.

## 2016-10-24 NOTE — Discharge Instructions (Signed)
I have attached your prescriptions. You can return if your symptoms worsen.
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
## 2016-10-28 ENCOUNTER — Other Ambulatory Visit: Payer: Self-pay | Admitting: Cardiovascular Disease

## 2016-10-30 ENCOUNTER — Telehealth: Payer: Self-pay | Admitting: Surgery

## 2016-10-30 NOTE — Telephone Encounter (Signed)
I scheduled the CTA appt for 01/14/17 at 12:30pm on Monday 01/14/17 at the [facility] location of [facility]. They asked the pt to arrive at 12noon and no solid foods 4 hours prior.  She is also scheduled to see [facility] on that same date at 1:30pm here at [facility]. I attempted to reach the patient by phone twice this morning but there was no answer and no VM. I mailed a letter to the patient with the above information. awt

## 2016-11-01 ENCOUNTER — Encounter (HOSPITAL_COMMUNITY): Payer: Self-pay

## 2016-11-01 ENCOUNTER — Emergency Department (HOSPITAL_COMMUNITY): Payer: BLUE CROSS/BLUE SHIELD

## 2016-11-01 ENCOUNTER — Emergency Department (HOSPITAL_COMMUNITY)
Admission: EM | Admit: 2016-11-01 | Discharge: 2016-11-01 | Disposition: A | Discharge status: Home / Self Care | Payer: BLUE CROSS/BLUE SHIELD | Attending: Emergency Medicine | Admitting: Emergency Medicine

## 2016-11-01 DIAGNOSIS — I252 Old myocardial infarction: Secondary | ICD-10-CM | POA: Insufficient documentation

## 2016-11-01 DIAGNOSIS — J449 Chronic obstructive pulmonary disease, unspecified: Secondary | ICD-10-CM | POA: Insufficient documentation

## 2016-11-01 DIAGNOSIS — I2510 Atherosclerotic heart disease of native coronary artery without angina pectoris: Secondary | ICD-10-CM | POA: Diagnosis not present

## 2016-11-01 DIAGNOSIS — Z87891 Personal history of nicotine dependence: Secondary | ICD-10-CM | POA: Insufficient documentation

## 2016-11-01 DIAGNOSIS — R1031 Right lower quadrant pain: Secondary | ICD-10-CM | POA: Insufficient documentation

## 2016-11-01 DIAGNOSIS — Z79899 Other long term (current) drug therapy: Secondary | ICD-10-CM | POA: Diagnosis not present

## 2016-11-01 DIAGNOSIS — Z955 Presence of coronary angioplasty implant and graft: Secondary | ICD-10-CM | POA: Diagnosis not present

## 2016-11-01 DIAGNOSIS — M545 Low back pain, unspecified: Secondary | ICD-10-CM

## 2016-11-01 DIAGNOSIS — R1084 Generalized abdominal pain: Secondary | ICD-10-CM

## 2016-11-01 DIAGNOSIS — Z7982 Long term (current) use of aspirin: Secondary | ICD-10-CM | POA: Diagnosis not present

## 2016-11-01 LAB — CBC WITH DIFFERENTIAL/PLATELET
BASOS PCT: 0 %
Basophils Absolute: 0 10*3/uL (ref 0.0–0.1)
EOS ABS: 0.1 10*3/uL (ref 0.0–0.7)
Eosinophils Relative: 1 %
HCT: 36.7 % (ref 36.0–46.0)
HEMOGLOBIN: 12.2 g/dL (ref 12.0–15.0)
Lymphocytes Relative: 38 %
Lymphs Abs: 2.9 10*3/uL (ref 0.7–4.0)
MCH: 30.3 pg (ref 26.0–34.0)
MCHC: 33.2 g/dL (ref 30.0–36.0)
MCV: 91.1 fL (ref 78.0–100.0)
MONOS PCT: 7 %
Monocytes Absolute: 0.5 10*3/uL (ref 0.1–1.0)
Neutro Abs: 4.2 10*3/uL (ref 1.7–7.7)
Neutrophils Relative %: 54 %
Platelets: 351 10*3/uL (ref 150–400)
RBC: 4.03 MIL/uL (ref 3.87–5.11)
RDW: 14.4 % (ref 11.5–15.5)
WBC: 7.7 10*3/uL (ref 4.0–10.5)

## 2016-11-01 LAB — COMPREHENSIVE METABOLIC PANEL
ALBUMIN: 3.8 g/dL (ref 3.5–5.0)
ALK PHOS: 124 U/L (ref 38–126)
ALT: 17 U/L (ref 14–54)
ANION GAP: 11 (ref 5–15)
AST: 16 U/L (ref 15–41)
BUN: 21 mg/dL — AB (ref 6–20)
CO2: 23 mmol/L (ref 22–32)
Calcium: 9.3 mg/dL (ref 8.9–10.3)
Chloride: 101 mmol/L (ref 101–111)
Creatinine, Ser: 1.26 mg/dL — ABNORMAL HIGH (ref 0.44–1.00)
GFR calc Af Amer: 51 mL/min — ABNORMAL LOW (ref >60)
GFR calc non Af Amer: 44 mL/min — ABNORMAL LOW (ref >60)
GLUCOSE: 83 mg/dL (ref 65–99)
POTASSIUM: 3 mmol/L — AB (ref 3.5–5.1)
SODIUM: 135 mmol/L (ref 135–145)
TOTAL PROTEIN: 8.5 g/dL — AB (ref 6.5–8.1)
Total Bilirubin: 0.4 mg/dL (ref 0.3–1.2)

## 2016-11-01 LAB — LIPASE, BLOOD: Lipase: 22 U/L (ref 11–51)

## 2016-11-01 MED ORDER — POTASSIUM CHLORIDE 20 MEQ PO PACK
40.0000 meq | PACK | Freq: Once | ORAL | Status: AC
  Administered 2016-11-01: 40 meq via ORAL
  Filled 2016-11-01: qty 2

## 2016-11-01 MED ORDER — FENTANYL CITRATE (PF) 100 MCG/2ML IJ SOLN
50.0000 ug | Freq: Once | INTRAMUSCULAR | Status: AC
  Administered 2016-11-01: 50 ug via INTRAVENOUS
  Filled 2016-11-01: qty 2

## 2016-11-01 MED ORDER — IOPAMIDOL (ISOVUE-300) INJECTION 61%
INTRAVENOUS | Status: AC
  Filled 2016-11-01: qty 30

## 2016-11-01 MED ORDER — ONDANSETRON HCL 4 MG/2ML IJ SOLN
4.0000 mg | Freq: Once | INTRAMUSCULAR | Status: AC
  Administered 2016-11-01: 4 mg via INTRAVENOUS
  Filled 2016-11-01: qty 2

## 2016-11-01 MED ORDER — SODIUM CHLORIDE 0.9 % IV BOLUS (SEPSIS)
500.0000 mL | Freq: Once | INTRAVENOUS | Status: AC
  Administered 2016-11-01: 500 mL via INTRAVENOUS

## 2016-11-01 NOTE — Discharge Instructions (Signed)
Tests show no life-threatening condition. Your potassium was slightly low tonight. Get in a comfortable position at home. Follow-up your primary care doctor.

## 2016-11-01 NOTE — ED Triage Notes (Signed)
Back pain started 2 weeks ago. Pain is in the lower right part of her back. Has taken some pain medication for it with no relief. Pt states pain is a 12/10. Feels like a burning sensation.

## 2016-11-01 NOTE — ED Notes (Signed)
Pt alert & oriented x4, stable gait. Patient given discharge instructions, paperwork & prescription(s). Patient  instructed to stop at the registration desk to finish any additional paperwork. Patient verbalized understanding. Pt left department w/ no further questions. 

## 2016-11-01 NOTE — ED Provider Notes (Signed)
[facility] DEPT Provider Note   CSN: [id number] Arrival date & time: 11/01/16  1712     History   Chief Complaint Chief Complaint  Patient presents with  . Back Pain    HPI Olivia Wade is a 66 y.o. female.  Patient complains of burning pain in her lower back for questionable 2 weeks. No radicular symptoms. She has been taking Ultram with minimal relief. Patient has only one good kidney on the left. No dysuria, hematuria, fever, sweats, chills.  Severity is moderate. Pain is worse with movement.      Past Medical History:  Diagnosis Date  . AAA (abdominal aortic aneurysm) (HCC)   . Anxiety    Panic attacks  . Arthritis   . Asthma   . Chronic low back pain    (Old vertebral fracture)  . Complication of anesthesia   . COPD (chronic obstructive pulmonary disease) (HCC)   . Coronary artery disease 09/08/13   with PCI  . Depression   . Family history of adverse reaction to anesthesia     mother - slow to awaken  . GERD (gastroesophageal reflux disease)   . Heart palpitations    prn toprol for rapid HR  . History of surgery on arm   . HLD (hyperlipidemia)   . HTN (hypertension)   . NSTEMI (non-ST elevated myocardial infarction) (HCC) 09/08/13  . PONV (postoperative nausea and vomiting)   . Renal disorder    ischemic kidney   . Tobacco abuse     Patient Active Problem List   Diagnosis Date Noted  . Renal infarct (HCC) 03/01/2016  . Normochromic normocytic anemia 03/01/2016  . S/P AAA repair using bifurcation graft 12/02/2015  . Aneurysm of infrarenal abdominal aorta (HCC) 11/21/2015  . Abdominal aortic aneurysm (AAA) >39 mm diameter (HCC) 11/21/2015  . Chest pain 03/27/2014  . Numbness and tingling in right hand 02/05/2014  . Sinus tachycardia, with exertion 12/23/2013  . Chest pain at rest, negative MI, probable GI 12/22/2013  . Syncope, secondary to NTG and standing 12/22/2013  . CAD (coronary artery disease), recent DES stents to RCA and LAD in 3/15 and  4/15 09/14/2013  . NSTEMI (non-ST elevated myocardial infarction) (HCC) 09/08/2013  . Unstable angina (HCC) 09/08/2013  . ACS (acute coronary syndrome) (HCC) 09/08/2013  . Abdominal aortic aneurysm 05/26/2013  . HTN (hypertension) 05/26/2013  . Hyperlipidemia 05/26/2013  . GERD (gastroesophageal reflux disease) 05/26/2013  . Chronic low back pain 05/26/2013  . Asthma with COPD 05/26/2013  . Tobacco abuse 05/26/2013    Past Surgical History:  Procedure Laterality Date  . 2d echo  12/2012   normal LV with mild LVH, grade 1 diastolic dysfunction  . ABDOMINAL AORTIC ANEURYSM REPAIR  12/02/2015   ABDOMINAL AORTIC ENDOVASCULAR FENESTRATED STENT GRAFT (N/A)  . ABDOMINAL AORTIC DUPLEX  05/19/2012   WHEN COMPARED TO PRIOR STUDY DATED 06/01/11 THERE IS AN INCREASE IN SIZE OF DILATATION.  Marland Kitchen ABDOMINAL AORTIC ENDOVASCULAR FENESTRATED STENT GRAFT N/A 12/02/2015   Procedure: ABDOMINAL AORTIC ENDOVASCULAR FENESTRATED STENT GRAFT;  Surgeon: Nada Libman, MD;  Location: [facility] OR;  Service: Vascular;  Laterality: N/A;  . ABDOMINAL AORTOGRAM N/A 07/24/2016   Procedure: Abdominal Aortogram;  Surgeon: Nada Libman, MD;  Location: [facility] INVASIVE CV LAB;  Service: Cardiovascular;  Laterality: N/A;  . ABDOMINAL HYSTERECTOMY    . CHOLECYSTECTOMY    . CORONARY ANGIOPLASTY WITH STENT PLACEMENT  09/10/13   PCI of RCA 90% proximal RCA stenosis with DES  . CORONARY  STENT PLACEMENT  09/08/13   PCI with cutting balloon, PTCA,DES to prox LAD  . CYST EXCISION Left    wrist  . DOPPLER ECHOCARDIOGRAPHY  12/31/2012  . LAPAROSCOPIC NISSEN FUNDOPLICATION     for severe reflux  . LEFT HEART CATHETERIZATION WITH CORONARY ANGIOGRAM N/A 09/08/2013   Procedure: LEFT HEART CATHETERIZATION WITH CORONARY ANGIOGRAM;  Surgeon: Lennette Bihari, MD;  Location: [facility] CATH LAB;  Service: Cardiovascular;  Laterality: N/A;  . NUCLEAR STRESS TEST  05/09/2011   NORMAL PATTREN OF PERFUSION IN ALL REGIONS. LV IS NORMAL IS SIZE. EF 68% NO WALL  MOTION ABNORMALITIES.  Marland Kitchen PERCUTANEOUS CORONARY STENT INTERVENTION (PCI-S) N/A 09/10/2013   Procedure: PERCUTANEOUS CORONARY STENT INTERVENTION (PCI-S);  Surgeon: Lennette Bihari, MD;  Location: [facility] CATH LAB;  Service: Cardiovascular;  Laterality: N/A;  . PERIPHERAL VASCULAR INTERVENTION Right 07/24/2016   Procedure: Peripheral Vascular Intervention;  Surgeon: Nada Libman, MD;  Location: [facility] INVASIVE CV LAB;  Service: Cardiovascular;  Laterality: Right;   RT RENAL STENT  LT EXT ILIAC STENT  . STENT PLACE LEFT URETER ([facility] HX)      OB History    No data available       Home Medications    Prior to Admission medications   Medication Sig Start Date End Date Taking? Authorizing Provider  acetaminophen (TYLENOL) 325 MG tablet Take 650 mg by mouth every 6 (six) hours as needed for moderate pain.    Yes [provider]  albuterol (PROVENTIL HFA;VENTOLIN HFA) 108 (90 BASE) MCG/ACT inhaler Inhale 1 puff into the lungs every 6 (six) hours as needed for wheezing or shortness of breath.   Yes [provider]  albuterol (PROVENTIL) (2.5 MG/3ML) 0.083% nebulizer solution Take 2.5 mg by nebulization every 6 (six) hours as needed for wheezing or shortness of breath.   Yes [provider]  ALPRAZolam Prudy Feeler) 0.5 MG tablet Take 0.5 mg by mouth 3 (three) times daily as needed for anxiety or sleep. *Patient is prescribed one tablets three times daily as needed for anxiety   Yes [provider]  aspirin 81 MG chewable tablet Chew 81 mg by mouth daily.   Yes [provider]  atorvastatin (LIPITOR) 40 MG tablet take 1 tablet by mouth once daily 09/17/16  Yes Laqueta Linden, MD  BRILINTA 60 MG TABS tablet take 1 tablet by mouth twice a day 10/01/16  Yes Laqueta Linden, MD  cholecalciferol (VITAMIN D) 1000 units tablet Take 1 tablet by mouth daily.   Yes [provider]  citalopram (CELEXA) 40 MG tablet Take 40 mg by mouth daily. 06/28/16  Yes [provider]  cyclobenzaprine (FLEXERIL) 10 MG tablet Take 1 tablet (10 mg total) by mouth 3 (three) times daily as needed for muscle spasms.   Yes Lennette Bihari, MD  diclofenac sodium (VOLTAREN) 1 % GEL Apply 4 g topically 4 (four) times daily. 10/24/16  Yes Maczis, Elmer Sow, PA-C  ferrous sulfate 325 (65 FE) MG tablet Take 325 mg by mouth daily with breakfast.  08/26/16  Yes [provider]  folic acid (FOLVITE) 1 MG tablet take 1 tablet by mouth once daily 07/20/16  Yes Laqueta Linden, MD  isosorbide mononitrate (IMDUR) 60 MG 24 hr tablet Take 1 tablet (60 mg total) by mouth daily. 05/23/16  Yes Laqueta Linden, MD  lisinopril (PRINIVIL,ZESTRIL) 5 MG tablet take 1 tablet by mouth once daily 06/07/16  Yes Laqueta Linden, MD  Melatonin 10 MG  TABS Take 1 tablet by mouth at bedtime as needed.   Yes [provider]  Menthol, Topical Analgesic, (ICY HOT BACK EX) Apply 1 application topically daily as needed (on back).   Yes [provider]  methocarbamol (ROBAXIN) 500 MG tablet Take 1 tablet (500 mg total) by mouth 2 (two) times daily. 10/24/16  Yes Maczis, Elmer Sow, PA-C  metoprolol succinate (TOPROL-XL) 50 MG 24 hr tablet take 1 tablet by mouth twice a day 05/28/16  Yes Laqueta Linden, MD  montelukast (SINGULAIR) 10 MG tablet Take 10 mg by mouth daily.    Yes [provider]  nitroGLYCERIN (NITROSTAT) 0.4 MG SL tablet place 1 tablet under the tongue if needed every 5 minutes fo 08/23/16  Yes Lennette Bihari, MD  pantoprazole (PROTONIX) 40 MG tablet take 1 tablet by mouth every morning 06/07/16  Yes Laqueta Linden, MD    Family History Family History  Problem Relation Age of Onset  . Heart attack Father   . Cancer Father        Esophageal  . Heart disease Father        before age 22  . Hypertension Mother   . Heart attack Son 76  . Heart disease Son        before age 63    Social History Social History  Substance Use  Topics  . Smoking status: Former Smoker    Packs/day: 0.50    Years: 37.00    Types: Cigarettes    Quit date: 09/08/2013  . Smokeless tobacco: Never Used  . Alcohol use No     Allergies   Bee venom; Hydrocodone; Penicillins; and Morphine and related   Review of Systems Review of Systems  All other systems reviewed and are negative.    Physical Exam Updated Vital Signs BP (!) 191/100 (BP Location: Left Arm)   Pulse 88   Temp 97.7 F (36.5 C) (Oral)   Resp 19   SpO2 95%   Physical Exam  Constitutional: She is oriented to person, place, and time. She appears well-developed and well-nourished.  HENT:  Head: Normocephalic and atraumatic.  Eyes: Conjunctivae are normal.  Neck: Neck supple.  Cardiovascular: Normal rate and regular rhythm.   Pulmonary/Chest: Effort normal and breath sounds normal.  Abdominal: Soft. Bowel sounds are normal.  Musculoskeletal:  Minimal tenderness in lower back.  Neurological: She is alert and oriented to person, place, and time.  Skin: Skin is warm and dry.  Psychiatric: She has a normal mood and affect. Her behavior is normal.  Nursing note and vitals reviewed.    ED Treatments / Results  Labs (all labs ordered are listed, but only abnormal results are displayed) Labs Reviewed  COMPREHENSIVE METABOLIC PANEL - Abnormal; Notable for the following:       Result Value   Potassium 3.0 (*)    BUN 21 (*)    Creatinine, Ser 1.26 (*)    Total Protein 8.5 (*)    GFR calc non Af Amer 44 (*)    GFR calc Af Amer 51 (*)    All other components within normal limits  CBC WITH DIFFERENTIAL/PLATELET  LIPASE, BLOOD    EKG  EKG Interpretation None       Radiology Ct Abdomen Pelvis Wo Contrast  Result Date: 11/01/2016 CLINICAL DATA:  66 year old female with right flank pain, burning sensation for 2 weeks. EXAM: CT ABDOMEN AND PELVIS WITHOUT CONTRAST TECHNIQUE: Multidetector CT imaging of the abdomen and pelvis was  performed following the  standard protocol without IV contrast. COMPARISON:  CTA abdomen and pelvis 07/09/2016 and earlier. FINDINGS: Lower chest: Chronic medial right lower lobe atelectasis or scarring is stable. No acute lung base opacity, pericardial or pleural effusion. Hepatobiliary: Surgically absent gallbladder. Negative noncontrast liver. Pancreas: Negative. Spleen: Negative. Adrenals/Urinary Tract: Normal adrenal glands. Chronic right renal atrophy. The right kidney and right ureter appears stable since January. No right side urologic calculus. More normal appearance of the noncontrast left kidney. No acute left perinephric stranding. Negative course of the left ureter. Urinary bladder remarkable for mild cystocele which appears related to pelvic floor laxity (sagittal image 60). Stomach/Bowel: Decompressed distal colon and left colon. Gas and stool in the transverse colon which is not dilated. Moderate retained stool throughout the right colon which is redundant. The cecum is located in the pelvis. Diminutive or absent appendix. Oral contrast has reached the distal small bowel but not yet the terminal ileum. Flocculated material in the terminal ileum. No dilated or inflamed small bowel. Moderate distension of the stomach with oral contrast. Negative duodenum. No abdominal free fluid. Vascular/Lymphatic: Vascular patency is not evaluated in the absence of IV contrast. 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 Reproductive: Surgically absent uterus.  Diminutive adnexa. Other: No pelvic free fluid. Musculoskeletal: Osteopenia. Widespread mild endplate compression in the lower thoracic and lumbar spine. Stable visualized osseous structures. IMPRESSION: 1. No acute or inflammatory process identified in the abdomen or pelvis. 2. Chronic right renal atrophy. No urologic calculus or obstructive uropathy. 3. Multilevel  lower thoracic and lumbar spine vertebral body compression appears stable since January. 4. Chronic abdominal aortic endograft. Electronically Signed   By: Odessa Fleming M.D.   On: 11/01/2016 20:46    Procedures Procedures (including critical care time)  Medications Ordered in ED Medications  iopamidol (ISOVUE-300) 61 % injection (not administered)  sodium chloride 0.9 % bolus 500 mL (0 mLs Intravenous Stopped 11/01/16 1916)  fentaNYL (SUBLIMAZE) injection 50 mcg (50 mcg Intravenous Given 11/01/16 1843)  ondansetron (ZOFRAN) injection 4 mg (4 mg Intravenous Given 11/01/16 1844)  potassium chloride (KLOR-CON) packet 40 mEq (40 mEq Oral Given 11/01/16 2059)  fentaNYL (SUBLIMAZE) injection 50 mcg (50 mcg Intravenous Given 11/01/16 2135)     Initial Impression / Assessment and Plan / ED Course  I have reviewed the triage vital signs and the nursing notes.  Pertinent labs & imaging results that were available during my care of the patient were reviewed by me and considered in my medical decision making (see chart for details).     CT scan reveals stable multilevel lumbar and thoracic vertebral body compression. Will treat pain. No acute abdomen. These findings were discussed with the patient and her husband.  Final Clinical Impressions(s) / ED Diagnoses   Final diagnoses:  Midline low back pain without sciatica, unspecified chronicity    New Prescriptions New Prescriptions   No medications on file     Donnetta Hutching, MD 11/01/16 2146

## 2016-11-28 ENCOUNTER — Inpatient Hospital Stay (HOSPITAL_COMMUNITY): Payer: BLUE CROSS/BLUE SHIELD

## 2016-11-28 ENCOUNTER — Encounter (HOSPITAL_COMMUNITY): Payer: Self-pay | Admitting: General Practice

## 2016-11-28 ENCOUNTER — Inpatient Hospital Stay (HOSPITAL_COMMUNITY)
Admission: AD | Admit: 2016-11-28 | Discharge: 2016-11-30 | DRG: 070 | Disposition: A | Discharge status: Home / Self Care | Payer: BLUE CROSS/BLUE SHIELD | Source: Other Acute Inpatient Hospital | Attending: Internal Medicine | Admitting: Internal Medicine

## 2016-11-28 DIAGNOSIS — Z01810 Encounter for preprocedural cardiovascular examination: Secondary | ICD-10-CM | POA: Diagnosis not present

## 2016-11-28 DIAGNOSIS — I639 Cerebral infarction, unspecified: Secondary | ICD-10-CM

## 2016-11-28 DIAGNOSIS — Z8249 Family history of ischemic heart disease and other diseases of the circulatory system: Secondary | ICD-10-CM

## 2016-11-28 DIAGNOSIS — Z87891 Personal history of nicotine dependence: Secondary | ICD-10-CM | POA: Diagnosis not present

## 2016-11-28 DIAGNOSIS — E785 Hyperlipidemia, unspecified: Secondary | ICD-10-CM

## 2016-11-28 DIAGNOSIS — I252 Old myocardial infarction: Secondary | ICD-10-CM

## 2016-11-28 DIAGNOSIS — K219 Gastro-esophageal reflux disease without esophagitis: Secondary | ICD-10-CM

## 2016-11-28 DIAGNOSIS — I10 Essential (primary) hypertension: Secondary | ICD-10-CM | POA: Diagnosis present

## 2016-11-28 DIAGNOSIS — Z955 Presence of coronary angioplasty implant and graft: Secondary | ICD-10-CM | POA: Diagnosis not present

## 2016-11-28 DIAGNOSIS — Z79899 Other long term (current) drug therapy: Secondary | ICD-10-CM | POA: Diagnosis not present

## 2016-11-28 DIAGNOSIS — I6789 Other cerebrovascular disease: Secondary | ICD-10-CM | POA: Diagnosis not present

## 2016-11-28 DIAGNOSIS — Z66 Do not resuscitate: Secondary | ICD-10-CM | POA: Diagnosis present

## 2016-11-28 DIAGNOSIS — F410 Panic disorder [episodic paroxysmal anxiety] without agoraphobia: Secondary | ICD-10-CM | POA: Diagnosis present

## 2016-11-28 DIAGNOSIS — G8929 Other chronic pain: Secondary | ICD-10-CM | POA: Diagnosis not present

## 2016-11-28 DIAGNOSIS — M1990 Unspecified osteoarthritis, unspecified site: Secondary | ICD-10-CM | POA: Diagnosis present

## 2016-11-28 DIAGNOSIS — M545 Low back pain: Secondary | ICD-10-CM | POA: Diagnosis present

## 2016-11-28 DIAGNOSIS — Z880 Allergy status to penicillin: Secondary | ICD-10-CM | POA: Diagnosis not present

## 2016-11-28 DIAGNOSIS — G936 Cerebral edema: Secondary | ICD-10-CM | POA: Diagnosis present

## 2016-11-28 DIAGNOSIS — E860 Dehydration: Secondary | ICD-10-CM | POA: Diagnosis present

## 2016-11-28 DIAGNOSIS — J449 Chronic obstructive pulmonary disease, unspecified: Secondary | ICD-10-CM | POA: Diagnosis present

## 2016-11-28 DIAGNOSIS — R51 Headache: Secondary | ICD-10-CM

## 2016-11-28 DIAGNOSIS — IMO0002 Reserved for concepts with insufficient information to code with codable children: Secondary | ICD-10-CM

## 2016-11-28 DIAGNOSIS — G939 Disorder of brain, unspecified: Principal | ICD-10-CM | POA: Diagnosis present

## 2016-11-28 DIAGNOSIS — F329 Major depressive disorder, single episode, unspecified: Secondary | ICD-10-CM | POA: Diagnosis not present

## 2016-11-28 DIAGNOSIS — E7800 Pure hypercholesterolemia, unspecified: Secondary | ICD-10-CM | POA: Diagnosis not present

## 2016-11-28 DIAGNOSIS — R55 Syncope and collapse: Secondary | ICD-10-CM

## 2016-11-28 DIAGNOSIS — Z7982 Long term (current) use of aspirin: Secondary | ICD-10-CM

## 2016-11-28 DIAGNOSIS — G9389 Other specified disorders of brain: Secondary | ICD-10-CM

## 2016-11-28 DIAGNOSIS — I2510 Atherosclerotic heart disease of native coronary artery without angina pectoris: Secondary | ICD-10-CM | POA: Diagnosis present

## 2016-11-28 DIAGNOSIS — R229 Localized swelling, mass and lump, unspecified: Secondary | ICD-10-CM

## 2016-11-28 LAB — COMPREHENSIVE METABOLIC PANEL
ALK PHOS: 94 U/L (ref 38–126)
ALT: 12 U/L — AB (ref 14–54)
AST: 17 U/L (ref 15–41)
Albumin: 3.5 g/dL (ref 3.5–5.0)
Anion gap: 5 (ref 5–15)
BILIRUBIN TOTAL: 0.9 mg/dL (ref 0.3–1.2)
BUN: 6 mg/dL (ref 6–20)
CALCIUM: 8.8 mg/dL — AB (ref 8.9–10.3)
CHLORIDE: 104 mmol/L (ref 101–111)
CO2: 27 mmol/L (ref 22–32)
CREATININE: 0.96 mg/dL (ref 0.44–1.00)
Glucose, Bld: 122 mg/dL — ABNORMAL HIGH (ref 65–99)
Potassium: 3.4 mmol/L — ABNORMAL LOW (ref 3.5–5.1)
Sodium: 136 mmol/L (ref 135–145)
Total Protein: 7 g/dL (ref 6.5–8.1)

## 2016-11-28 LAB — CBC
HCT: 34.7 % — ABNORMAL LOW (ref 36.0–46.0)
Hemoglobin: 11 g/dL — ABNORMAL LOW (ref 12.0–15.0)
MCH: 29.2 pg (ref 26.0–34.0)
MCHC: 31.7 g/dL (ref 30.0–36.0)
MCV: 92 fL (ref 78.0–100.0)
PLATELETS: 335 10*3/uL (ref 150–400)
RBC: 3.77 MIL/uL — ABNORMAL LOW (ref 3.87–5.11)
RDW: 14.9 % (ref 11.5–15.5)
WBC: 7.2 10*3/uL (ref 4.0–10.5)

## 2016-11-28 LAB — APTT: APTT: 30 s (ref 24–36)

## 2016-11-28 LAB — SEDIMENTATION RATE: SED RATE: 32 mm/h — AB (ref 0–22)

## 2016-11-28 LAB — PROTIME-INR
INR: 1.05
Prothrombin Time: 13.8 seconds (ref 11.4–15.2)

## 2016-11-28 LAB — TROPONIN I: Troponin I: <0.03 ng/mL (ref <0.03)

## 2016-11-28 LAB — C-REACTIVE PROTEIN: CRP: <0.8 mg/dL (ref <1.0)

## 2016-11-28 MED ORDER — PANTOPRAZOLE SODIUM 40 MG PO TBEC
40.0000 mg | DELAYED_RELEASE_TABLET | Freq: Every morning | ORAL | Status: DC
  Administered 2016-11-28 – 2016-11-30 (×3): 40 mg via ORAL
  Filled 2016-11-28 (×3): qty 1

## 2016-11-28 MED ORDER — METOPROLOL SUCCINATE ER 25 MG PO TB24
50.0000 mg | ORAL_TABLET | Freq: Two times a day (BID) | ORAL | Status: DC
  Administered 2016-11-28 – 2016-11-30 (×5): 50 mg via ORAL
  Filled 2016-11-28 (×5): qty 2

## 2016-11-28 MED ORDER — ALBUTEROL SULFATE (2.5 MG/3ML) 0.083% IN NEBU: 2.5000 mg | INHALATION_SOLUTION | Freq: Two times a day (BID) | RESPIRATORY_TRACT | Status: DC

## 2016-11-28 MED ORDER — ALBUTEROL SULFATE (2.5 MG/3ML) 0.083% IN NEBU: 2.5000 mg | INHALATION_SOLUTION | Freq: Four times a day (QID) | RESPIRATORY_TRACT | Status: DC | PRN

## 2016-11-28 MED ORDER — MELATONIN 10 MG PO TABS: 10.0000 mg | ORAL_TABLET | Freq: Every evening | ORAL | Status: DC | PRN

## 2016-11-28 MED ORDER — ATORVASTATIN CALCIUM 40 MG PO TABS
40.0000 mg | ORAL_TABLET | Freq: Every day | ORAL | Status: DC
  Administered 2016-11-28 – 2016-11-29 (×2): 40 mg via ORAL
  Filled 2016-11-28 (×2): qty 1

## 2016-11-28 MED ORDER — LORAZEPAM 2 MG/ML IJ SOLN
1.0000 mg | Freq: Once | INTRAMUSCULAR | Status: AC | PRN
  Administered 2016-11-28: 2 mg via INTRAVENOUS

## 2016-11-28 MED ORDER — TICAGRELOR 60 MG PO TABS
60.0000 mg | ORAL_TABLET | Freq: Two times a day (BID) | ORAL | Status: DC
  Administered 2016-11-28 – 2016-11-30 (×5): 60 mg via ORAL
  Filled 2016-11-28 (×6): qty 1

## 2016-11-28 MED ORDER — CYCLOBENZAPRINE HCL 10 MG PO TABS: 10.0000 mg | ORAL_TABLET | Freq: Three times a day (TID) | ORAL | Status: DC | PRN

## 2016-11-28 MED ORDER — ONDANSETRON HCL 4 MG/2ML IJ SOLN
4.0000 mg | Freq: Four times a day (QID) | INTRAMUSCULAR | Status: DC | PRN
  Administered 2016-11-28: 4 mg via INTRAVENOUS

## 2016-11-28 MED ORDER — SODIUM CHLORIDE 0.9 % IV SOLN
INTRAVENOUS | Status: DC
  Administered 2016-11-28: 15:00:00 via INTRAVENOUS

## 2016-11-28 MED ORDER — ONDANSETRON HCL 4 MG PO TABS
4.0000 mg | ORAL_TABLET | Freq: Four times a day (QID) | ORAL | Status: DC | PRN
Start: 2016-11-28 — End: 2016-11-30

## 2016-11-28 MED ORDER — ISOSORBIDE MONONITRATE ER 60 MG PO TB24
60.0000 mg | ORAL_TABLET | Freq: Every day | ORAL | Status: DC
  Administered 2016-11-28 – 2016-11-30 (×3): 60 mg via ORAL
  Filled 2016-11-28 (×3): qty 1

## 2016-11-28 MED ORDER — SODIUM CHLORIDE 0.9% FLUSH
3.0000 mL | Freq: Two times a day (BID) | INTRAVENOUS | Status: DC
  Administered 2016-11-29 – 2016-11-30 (×2): 3 mL via INTRAVENOUS

## 2016-11-28 MED ORDER — ALPRAZOLAM 0.5 MG PO TABS
0.5000 mg | ORAL_TABLET | Freq: Three times a day (TID) | ORAL | Status: DC
Start: 2016-11-28 — End: 2016-11-30
  Administered 2016-11-28 – 2016-11-30 (×6): 0.5 mg via ORAL
  Filled 2016-11-28 (×6): qty 1

## 2016-11-28 MED ORDER — PROMETHAZINE HCL 25 MG/ML IJ SOLN: 25.0000 mg | Freq: Three times a day (TID) | INTRAMUSCULAR | Status: DC | PRN

## 2016-11-28 MED ORDER — HYDRALAZINE HCL 20 MG/ML IJ SOLN
5.0000 mg | INTRAMUSCULAR | Status: DC | PRN
  Administered 2016-11-28 (×2): 5 mg via INTRAVENOUS
  Administered 2016-11-29: 10 mg via INTRAVENOUS
  Filled 2016-11-28 (×2): qty 1

## 2016-11-28 MED ORDER — TRAMADOL HCL 50 MG PO TABS
100.0000 mg | ORAL_TABLET | Freq: Four times a day (QID) | ORAL | Status: DC | PRN
  Administered 2016-11-28: 100 mg via ORAL
  Filled 2016-11-28 (×2): qty 2

## 2016-11-28 MED ORDER — ONDANSETRON HCL 4 MG/2ML IJ SOLN
INTRAMUSCULAR | Status: AC
  Filled 2016-11-28: qty 2

## 2016-11-28 MED ORDER — GADOBENATE DIMEGLUMINE 529 MG/ML IV SOLN
15.0000 mL | Freq: Once | INTRAVENOUS | Status: AC
  Administered 2016-11-28: 15 mL via INTRAVENOUS

## 2016-11-28 MED ORDER — LORAZEPAM 2 MG/ML IJ SOLN
INTRAMUSCULAR | Status: AC
  Filled 2016-11-28: qty 1

## 2016-11-28 MED ORDER — SODIUM CHLORIDE 0.9 % IV BOLUS (SEPSIS): 1000.0000 mL | Freq: Once | INTRAVENOUS | Status: DC

## 2016-11-28 MED ORDER — LISINOPRIL 5 MG PO TABS
5.0000 mg | ORAL_TABLET | Freq: Every day | ORAL | Status: DC
  Administered 2016-11-28: 5 mg via ORAL
  Filled 2016-11-28: qty 1

## 2016-11-28 MED ORDER — ACETAMINOPHEN 500 MG PO TABS
1000.0000 mg | ORAL_TABLET | Freq: Four times a day (QID) | ORAL | Status: DC | PRN
  Administered 2016-11-29: 1000 mg via ORAL
  Filled 2016-11-28: qty 2

## 2016-11-28 MED ORDER — MONTELUKAST SODIUM 10 MG PO TABS
10.0000 mg | ORAL_TABLET | Freq: Every day | ORAL | Status: DC
  Administered 2016-11-28 – 2016-11-30 (×3): 10 mg via ORAL
  Filled 2016-11-28 (×3): qty 1

## 2016-11-28 MED ORDER — CITALOPRAM HYDROBROMIDE 10 MG PO TABS
20.0000 mg | ORAL_TABLET | Freq: Every day | ORAL | Status: DC
  Administered 2016-11-28 – 2016-11-30 (×3): 20 mg via ORAL
  Filled 2016-11-28 (×3): qty 2

## 2016-11-28 MED ORDER — ACETAMINOPHEN 650 MG RE SUPP: 650.0000 mg | Freq: Four times a day (QID) | RECTAL | Status: DC | PRN

## 2016-11-28 NOTE — Consult Note (Signed)
Reason for Consult:  Question of left occipital brain tumor raised by radiologist Referring Physician:  Dr. Linna Darner  Olivia Wade is an 66 y.o. right-handed white female.  HPI: Patient with significant history of atherosclerotic cardiovascular disease and MI, status post coronary stenting by Dr.Tom Claiborne Billings (continues on Brillinta), atherosclerotic peripheral vascular disease status post numerous vascular reconstructions and revascularizations, COPD, hyperlipidemia. She apparently quit smoking after her MI 3 years ago, but continues to "vape."  Patient apparently was found on the floor at her home yesterday and was taken to [facility]). It is unclear whether this actually represents a syncopal episode or not. CT of the head was done and showed an area of hypodensity in the left occipital lobe, and the patient was subsequent transferred to the triad hospitalist service at [facility] for further evaluation. Patient seen in neurology consultation by Dr. Kerney Elbe. She is undergone an MRI of the brain earlier today which showed a small area of ischemia in the left medial frontal gyrus as well as evidence of edema in the left occipital lobe (per the interpreting radiologist) . Notably no mass was seen on unenhanced or enhanced images, nonetheless the radiologist raises the question of a tumor and neurosurgical consultation was requested.  The patient herself is able to provide only limited history. She is oriented, but cannot explain why she is at the hospital. She does note that she's had headaches for decades, they have varied in severity, and she denies any current headache. Her daughter, who was at the bedside, reminded her that she was seeing spots in the left eye. On further specific questioning the patient says that the spots are solely in the left eye, and none are in the right eye. The patient's daughter explains that she's had some slurred speech for  several days, its severity varies. Little further history could be obtained from the patient, her husband, or her daughter.  Past Medical History:  Past Medical History:  Diagnosis Date  . AAA (abdominal aortic aneurysm) ([place])   . Anxiety    Panic attacks  . Arthritis   . Asthma   . Chronic low back pain    (Old vertebral fracture)  . Complication of anesthesia   . COPD (chronic obstructive pulmonary disease) ([place])   . Coronary artery disease 09/08/13   with PCI  . Depression   . Family history of adverse reaction to anesthesia     mother - slow to awaken  . GERD (gastroesophageal reflux disease)   . Heart palpitations    prn toprol for rapid HR  . History of surgery on arm   . HLD (hyperlipidemia)   . HTN (hypertension)   . NSTEMI (non-ST elevated myocardial infarction) ([place]) 09/08/13  . PONV (postoperative nausea and vomiting)   . Renal disorder    ischemic kidney   . Tobacco abuse     Past Surgical History:  Past Surgical History:  Procedure Laterality Date  . 2d echo  12/2012   normal LV with mild LVH, grade 1 diastolic dysfunction  . ABDOMINAL AORTIC ANEURYSM REPAIR  12/02/2015   ABDOMINAL AORTIC ENDOVASCULAR FENESTRATED STENT GRAFT (N/A)  . ABDOMINAL AORTIC DUPLEX  05/19/2012   WHEN COMPARED TO PRIOR STUDY DATED 06/01/11 THERE IS AN INCREASE IN SIZE OF DILATATION.  Marland Kitchen ABDOMINAL AORTIC ENDOVASCULAR FENESTRATED STENT GRAFT N/A 12/02/2015   Procedure: ABDOMINAL AORTIC ENDOVASCULAR FENESTRATED STENT GRAFT;  Surgeon: Serafina Mitchell, MD;  Location: [place];  Service:  Vascular;  Laterality: N/A;  . ABDOMINAL AORTOGRAM N/A 07/24/2016   Procedure: Abdominal Aortogram;  Surgeon: Serafina Mitchell, MD;  Location: [place] CV LAB;  Service: Cardiovascular;  Laterality: N/A;  . ABDOMINAL HYSTERECTOMY    . CHOLECYSTECTOMY    . CORONARY ANGIOPLASTY WITH STENT PLACEMENT  09/10/13   PCI of RCA 90% proximal RCA stenosis with DES  . CORONARY STENT PLACEMENT  09/08/13   PCI with cutting  balloon, PTCA,DES to prox LAD  . CYST EXCISION Left    wrist  . DOPPLER ECHOCARDIOGRAPHY  12/31/2012  . LAPAROSCOPIC NISSEN FUNDOPLICATION     for severe reflux  . LEFT HEART CATHETERIZATION WITH CORONARY ANGIOGRAM N/A 09/08/2013   Procedure: LEFT HEART CATHETERIZATION WITH CORONARY ANGIOGRAM;  Surgeon: Troy Sine, MD;  Location: [facility] C[facility] LAB;  Service: Cardiovascular;  Laterality: N/A;  . NUCLEAR STRESS TEST  05/09/2011   NORMAL PATTREN OF PERFUSION IN ALL REGIONS. LV IS NORMAL IS SIZE. EF 68% NO WALL MOTION ABNORMALITIES.  Marland Kitchen PERCUTANEOUS CORONARY STENT INTERVENTION (PCI-S) N/A 09/10/2013   Procedure: PERCUTANEOUS CORONARY STENT INTERVENTION (PCI-S);  Surgeon: Troy Sine, MD;  Location: [facility] C[facility] LAB;  Service: Cardiovascular;  Laterality: N/A;  . PERIPHERAL VASCULAR INTERVENTION Right 07/24/2016   Procedure: Peripheral Vascular Intervention;  Surgeon: Serafina Mitchell, MD;  Location: [place] CV LAB;  Service: Cardiovascular;  Laterality: Right;   RT RENAL STENT  LT EXT ILIAC STENT  . STENT PLACE LEFT URETER ([place] HX)      Family History:  Family History  Problem Relation Age of Onset  . Heart attack Father   . Cancer Father        Esophageal  . Heart disease Father        before age 45  . Hypertension Mother   . Heart attack Son 25  . Heart disease Son        before age 64    Social History:  reports that she quit smoking about 3 years ago. Her smoking use included Cigarettes. She has a 18.50 pack-year smoking history. She has never used smokeless tobacco. She reports that she does not drink alcohol or use drugs.  Allergies:  Allergies  Allergen Reactions  . Bee Venom Anaphylaxis, Hives and Swelling  . Hydrocodone Other (See Comments)  . Penicillins Other (See Comments)    PATIENT REPORTS "MOTHER TOLD HER SHE HAD ALLERGY TO PCN"  UNKNOWN REACTION TO PCN PCN reaction causing immediate rash, facial/tongue/throat swelling, SOB or lightheadedness with hypotension:  unknown PCN reaction causing severe rash involving mucus membranes or skin necrosis: unknown PCN reaction that required hospitalization unknown PCN reaction occurring within the last 10 years: unknown  . Morphine And Related Rash    Medications: I have reviewed the patient's current medications.  ROS:  Notable for those difficulties described in her history of present illness and past medical history, but is otherwise unremarkable.  Physical Examination: Elderly white female in no acute distress. Blood pressure 127/74, pulse 84, temperature 98.3 F (36.8 C), temperature source Oral, resp. rate 18, SpO2 96 %. Lungs:  Clear to auscultation, symmetrical respiratory excursion. Heart:  Regular rate and rhythm, no murmur. Abdomen:  Soft, nondistended, bowel sounds present. Extremity:  No clubbing, cyanosis, or edema.  Neurological Examination: Mental Status Examination:  Awake, alert, oriented to her name, [facility], hospital, and June 2018. Follows commands. Speech fluent. Confused though about the circumstances of her hospitalization. Cranial Nerve Examination:  Pupils equal, round, about 4 mm bilaterally, reactive  to light. Visual fields grossly intact to confrontation. EOMI. Facial sensation intact. Facial movements symmetrical. Hearing present bilaterally. Palatal movement symmetrical. Shoulder shrug symmetrical. Tongue midline. Motor Examination:  5/5 strength in the upper and lower extremities. No drift of the upper extremities. Sensory Examination:  Intact to pinprick in the upper and lower extremities. Reflex Examination:   Symmetrical in the upper and lower extremities. Toes downgoing bilaterally. Gait and Stance Examination:  Not tested due to the nature the patient's condition.   Results for orders placed or performed during the hospital encounter of 11/28/16 (from the past 48 hour(s))  Comprehensive metabolic panel     Status: Abnormal   Collection Time: 11/28/16 10:45 AM   Result Value Ref Range   Sodium 136 135 - 145 mmol/L   Potassium 3.4 (L) 3.5 - 5.1 mmol/L   Chloride 104 101 - 111 mmol/L   CO2 27 22 - 32 mmol/L   Glucose, Bld 122 (H) 65 - 99 mg/dL   BUN 6 6 - 20 mg/dL   Creatinine, Ser 0.96 0.44 - 1.00 mg/dL   Calcium 8.8 (L) 8.9 - 10.3 mg/dL   Total Protein 7.0 6.5 - 8.1 g/dL   Albumin 3.5 3.5 - 5.0 g/dL   AST 17 15 - 41 U/L   ALT 12 (L) 14 - 54 U/L   Alkaline Phosphatase 94 38 - 126 U/L   Total Bilirubin 0.9 0.3 - 1.2 mg/dL   GFR calc non Af Amer >60 >60 mL/min   GFR calc Af Amer >60 >60 mL/min    Comment: (NOTE) The eGFR has been calculated using the CKD EPI equation. This calculation has not been validated in all clinical situations. eGFR's persistently <60 mL/min signify possible Chronic Kidney Disease.    Anion gap 5 5 - 15  CBC     Status: Abnormal   Collection Time: 11/28/16 10:45 AM  Result Value Ref Range   WBC 7.2 4.0 - 10.5 K/uL   RBC 3.77 (L) 3.87 - 5.11 MIL/uL   Hemoglobin 11.0 (L) 12.0 - 15.0 g/dL   HCT 34.7 (L) 36.0 - 46.0 %   MCV 92.0 78.0 - 100.0 fL   MCH 29.2 26.0 - 34.0 pg   MCHC 31.7 30.0 - 36.0 g/dL   RDW 14.9 11.5 - 15.5 %   Platelets 335 150 - 400 K/uL  Sedimentation rate     Status: Abnormal   Collection Time: 11/28/16 10:45 AM  Result Value Ref Range   Sed Rate 32 (H) 0 - 22 mm/hr  C-reactive protein     Status: None   Collection Time: 11/28/16 10:45 AM  Result Value Ref Range   CRP <0.8 <1.0 mg/dL  APTT     Status: None   Collection Time: 11/28/16 10:45 AM  Result Value Ref Range   aPTT 30 24 - 36 seconds  Protime-INR     Status: None   Collection Time: 11/28/16 10:45 AM  Result Value Ref Range   Prothrombin Time 13.8 11.4 - 15.2 seconds   INR 1.05   Troponin I     Status: None   Collection Time: 11/28/16 10:45 AM  Result Value Ref Range   Troponin I <0.03 <0.03 ng/mL    Dg Chest 2 View  Result Date: 11/28/2016 CLINICAL DATA:  Syncope EXAM: CHEST  2 VIEW COMPARISON:  May 10, 2016  FINDINGS: There is no edema or consolidation. Heart is upper normal in size with pulmonary vascularity within normal limits. No adenopathy. There is  an abdominal aortic stent graft. There is aortic atherosclerosis. No evident bone lesions. IMPRESSION: No edema or consolidation. There is aortic atherosclerosis. Abdominal aortic stent graft present. Electronically Signed   By: Lowella Grip III M.D.   On: 11/28/2016 13:55   Mr Jodene Nam Head Wo Contrast  Result Date: 11/28/2016 CLINICAL DATA:  Syncope.  Potential stroke. EXAM: MRI HEAD WITHOUT AND WITH CONTRAST MRA HEAD WITHOUT CONTRAST TECHNIQUE: Multiplanar, multiecho pulse sequences of the brain and surrounding structures were obtained without and with intravenous contrast. Angiographic images of the head were obtained using MRA technique without contrast. CONTRAST:  15 mL MULTIHANCE GADOBENATE DIMEGLUMINE 529 MG/ML IV SOLN COMPARISON:  Head CT 11/28/2016 FINDINGS: MRI HEAD FINDINGS Brain: There is a partially empty sella. There is a punctate focus of diffusion restriction within the parasagittal left frontal lobe, along the base of the left medial frontal gyrus. No acute hemorrhage. No mass effect. There is multifocal hyperintense T2-weighted signal within the periventricular white matter, most often seen in the setting of chronic microvascular ischemia. There is a volume positive area of hyperintense T2 weighted signal in the left occipital lobe that spares the cortex in a vasogenic edema pattern. There is no associated contrast enhancement. There are multiple old lacunar infarcts of the right basal ganglia and corona radiata. No chronic microhemorrhage or cerebral amyloid angiopathy. No hydrocephalus, age advanced atrophy or lobar predominant volume loss. No dural abnormality or extra-axial collection. Skull and upper cervical spine: The visualized skull base, calvarium, upper cervical spine and extracranial soft tissues are normal. Sinuses/Orbits: No fluid  levels or advanced mucosal thickening. No mastoid effusion. Normal orbits. MRA HEAD FINDINGS Intracranial internal carotid arteries: Normal. Anterior cerebral arteries: Normal. Middle cerebral arteries: Normal. Posterior communicating arteries: Absent bilaterally. Posterior cerebral arteries: Normal. Basilar artery: Normal. Vertebral arteries: Codominant. Normal. Superior cerebellar arteries: Normal. Anterior inferior cerebellar arteries: Normal. Posterior inferior cerebellar arteries: Normal. IMPRESSION: 1. Punctate focus of acute ischemia within the left medial frontal gyrus without acute hemorrhage or mass effect. 2. Vasogenic edema within the left occipital lobe without associated contrast enhancement. This may indicate a neoplasm such as a low grade glioma. If there are remote prior imaging studies of the brain, comparison would be helpful. A second possibility is a relatively recent, early chronic infarct. Follow-up imaging in approximately 12 weeks is recommended, as the presence or absence of associated volume loss would aid in differentiating the two. 3. 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 4. Normal intracranial MRA. Electronically Signed   By: Ulyses Jarred M.D.   On: 11/28/2016 13:50   Mr Jeri Cos ZO Contrast  Result Date: 11/28/2016 CLINICAL DATA:  Syncope.  Potential stroke. EXAM: MRI HEAD WITHOUT AND WITH CONTRAST MRA HEAD WITHOUT CONTRAST TECHNIQUE: Multiplanar, multiecho pulse sequences of the brain and surrounding structures were obtained without and with intravenous contrast. Angiographic images of the head were obtained using MRA technique without contrast. CONTRAST:  15 mL MULTIHANCE GADOBENATE DIMEGLUMINE 529 MG/ML IV SOLN COMPARISON:  Head CT 11/28/2016 FINDINGS: MRI HEAD FINDINGS Brain: There is a partially empty sella. There is a punctate focus of diffusion restriction within the parasagittal left frontal lobe, along the base of the left  medial frontal gyrus. No acute hemorrhage. No mass effect. There is multifocal hyperintense T2-weighted signal within the periventricular white matter, most often seen in the setting of chronic microvascular ischemia. There is a volume positive area of hyperintense T2 weighted signal in the left occipital lobe that spares the  cortex in a vasogenic edema pattern. There is no associated contrast enhancement. There are multiple old lacunar infarcts of the right basal ganglia and corona radiata. No chronic microhemorrhage or cerebral amyloid angiopathy. No hydrocephalus, age advanced atrophy or lobar predominant volume loss. No dural abnormality or extra-axial collection. Skull and upper cervical spine: The visualized skull base, calvarium, upper cervical spine and extracranial soft tissues are normal. Sinuses/Orbits: No fluid levels or advanced mucosal thickening. No mastoid effusion. Normal orbits. MRA HEAD FINDINGS Intracranial internal carotid arteries: Normal. Anterior cerebral arteries: Normal. Middle cerebral arteries: Normal. Posterior communicating arteries: Absent bilaterally. Posterior cerebral arteries: Normal. Basilar artery: Normal. Vertebral arteries: Codominant. Normal. Superior cerebellar arteries: Normal. Anterior inferior cerebellar arteries: Normal. Posterior inferior cerebellar arteries: Normal. IMPRESSION: 1. Punctate focus of acute ischemia within the left medial frontal gyrus without acute hemorrhage or mass effect. 2. Vasogenic edema within the left occipital lobe without associated contrast enhancement. This may indicate a neoplasm such as a low grade glioma. If there are remote prior imaging studies of the brain, comparison would be helpful. A second possibility is a relatively recent, early chronic infarct. Follow-up imaging in approximately 12 weeks is recommended, as the presence or absence of associated volume loss would aid in differentiating the two. 3. Chronic microvascular ischemia and  multiple old right basal ganglia lacunar infarcts. 4. Normal intracranial MRA. Electronically Signed   By: Ulyses Jarred M.D.   On: 11/28/2016 13:50     Assessment/Plan: Elderly, vasculopathic white female admitted on transfer from [facility] to triad hospitalist service at [facility] by Dr. Linna Darner. Seen in neurology consultation by Dr. Kerney Elbe. MRI scan shows an area of acute ischemia in the left medial frontal gyrus as well as vasogenic edema in the left occipital lobe.  Her family describes slurred speech, although I did not find that on exam this evening, but neither of the areas of abnormality seen on MRI would correspond to that. Further the spots that she describes in the left eye would not correspond to these areas of abnormality either, and I do wonder whether it is arising from an ocular source.  As regards the area of ischemia in the medial left frontal gyrus, this will need to undergo workup and outpatient follow-up with the stroke neurology service.  As regards the area of vasogenic edema in the left occipital lobe, it is unclear what its nature is. Because of her extensive vasculopathic history the most likely cause is cerebrovascular disease. There is mention in the records of a period of significant hypertension, and a question of PRES has been raised. An oncologic cause cannot be excluded. The changes in the left occipital lobe will need to be followed as an outpatient, certainly initially by the stroke neurology service, but ultimately it may be best for this patient to undergo evaluation and further follow-up with Dr. Shary Key, a neuro oncologist who will be joining the [place] on July 9. His background is training as a neurologist, and subsequently as a Child psychotherapist.  I've discussed my consultation with Dr. Marily Memos. Because of the possibility that neurosurgical intervention for craniotomy or biopsy, I have recommended cardiology  consultation be obtained to inquire whether the Brillinta can be stopped for neurosurgical intervention and for preoperative cardiology clearance for anesthesia and surgery, if it were to be necessary.   I spoke with the patient, her husband, her daughter, and her grandson and granddaughter all of whom were at the bedside regarding my assessment  and recommendations.  Hosie Spangle, MD 11/28/2016, 8:16 PM

## 2016-11-28 NOTE — Progress Notes (Addendum)
   11/28/16 1444  Vitals  Temp 98.3 F (36.8 C)  Temp Source Oral  BP (!) 165/90  BP Location Left Arm  BP Method Automatic  Patient Position (if appropriate) Lying  Pulse Rate 83  Pulse Rate Source Dinamap  Resp 16  Orthostatic Lying   BP- Lying 165/90  Pulse- Lying 83  Orthostatic Sitting  BP- Sitting (!) 143/125  Pulse- Sitting 91  Orthostatic Standing at 0 minutes  BP- Standing at 0 minutes (!) 143/98  Pulse- Standing at 0 minutes 93  Oxygen Therapy  SpO2 93 %  O2 Device Room Air     Orthostatic VS as above. Pt still c/o dizziness with standing b/p. No other distress noted.   Sim Boast, RN

## 2016-11-28 NOTE — Progress Notes (Signed)
Transfer from [facility] discussed with Dr.Casaletto  [name] is a 66 year old female with past medical history of HTN, HLD, CAD, COPD, AAA, and tobacco abuse; who presented with c/o 3 days of headache and syncopal event. CT scan of the brain shows vasogenic edema in the left occipital region most consistent with the possibility of underlying mass. No MRI available at outside facility transferring for need of MRI and or possible neurosurgery consult. Vital signs otherwise noted to be within normal limits currently at this time. Neurology was also be contacted prior to transport. Transferring as an inpatient to a telemetry bed.

## 2016-11-28 NOTE — Progress Notes (Signed)
Pt admitted from [facility] hospital via care link, alert and oriented, pt settled in bed with call light and husband at bedside, safety concern addressed accordingly, Dr Otelia Limes came to see pt, was however reassured, will continue to monitor. Obasogie-Asidi, Yani Coventry Efe

## 2016-11-28 NOTE — H&P (Signed)
History and Physical    BRIEANA SHIMMIN Wade:[id number] DOB: 25-Apr-1951 DOA: 11/28/2016  PCP: Louie Boston., MD Patient coming from: [facility]  Chief Complaint: Syncope w/ possible brain mass  HPI: Olivia Wade is a 66 y.o. female with medical history significant of depression/anxiety, asthma, COPD, Cory artery disease status post cardiac cardiac catheterization, GERD, hypertension, hyperlipidemia. Patient is former smoker.  Patient is coming from [facility] him due to no MRI services available.  Of note patient endorses a 7 day history of intermittent difficulty with her speech. This is better described as either inappropriate/unintentional words coming out or difficulty with word finding. Patient endorses a 3 day history of general fatigue and weakness with intermittent headaches. Tylenol with improvement in symptoms. On the night prior to admission patient reports a relating to her bathroom and washing her face and her normal nightly routine to get ready for bed. Shortly before this patient began to feel somewhat dizzy. Patient then states that she fell backward striking her head on the ground. She will cup a short time later with her husband standing at her side. EMS was called and patient was brought to the emergency room. Patient endorsed significant worsening headache since syncopal episode. Located predominantly in the back of her head. She is only received minimal relief despite additional medications given at [facility] for headache. Additionally patient endorses seeing purple spots in her left visual fields though denies any loss of vision. Right eye is normal.  Patient symptoms denies any other focal neurological deficits, fevers, chest pain, shortness breath, palpitations, bone pain, dysuria, frequency, neck stiffness,.   ED Course: NA  Review of Systems: As per HPI otherwise all other systems reviewed and are negative  Ambulatory Status:no restrictions  Past Medical History:    Diagnosis Date  . AAA (abdominal aortic aneurysm) (HCC)   . Anxiety    Panic attacks  . Arthritis   . Asthma   . Chronic low back pain    (Old vertebral fracture)  . Complication of anesthesia   . COPD (chronic obstructive pulmonary disease) (HCC)   . Coronary artery disease 09/08/13   with PCI  . Depression   . Family history of adverse reaction to anesthesia     mother - slow to awaken  . GERD (gastroesophageal reflux disease)   . Heart palpitations    prn toprol for rapid HR  . History of surgery on arm   . HLD (hyperlipidemia)   . HTN (hypertension)   . NSTEMI (non-ST elevated myocardial infarction) (HCC) 09/08/13  . PONV (postoperative nausea and vomiting)   . Renal disorder    ischemic kidney   . Tobacco abuse     Past Surgical History:  Procedure Laterality Date  . 2d echo  12/2012   normal LV with mild LVH, grade 1 diastolic dysfunction  . ABDOMINAL AORTIC ANEURYSM REPAIR  12/02/2015   ABDOMINAL AORTIC ENDOVASCULAR FENESTRATED STENT GRAFT (N/A)  . ABDOMINAL AORTIC DUPLEX  05/19/2012   WHEN COMPARED TO PRIOR STUDY DATED 06/01/11 THERE IS AN INCREASE IN SIZE OF DILATATION.  Marland Kitchen ABDOMINAL AORTIC ENDOVASCULAR FENESTRATED STENT GRAFT N/A 12/02/2015   Procedure: ABDOMINAL AORTIC ENDOVASCULAR FENESTRATED STENT GRAFT;  Surgeon: Nada Libman, MD;  Location: [facility] OR;  Service: Vascular;  Laterality: N/A;  . ABDOMINAL AORTOGRAM N/A 07/24/2016   Procedure: Abdominal Aortogram;  Surgeon: Nada Libman, MD;  Location: [facility] INVASIVE CV LAB;  Service: Cardiovascular;  Laterality: N/A;  . ABDOMINAL HYSTERECTOMY    .  CHOLECYSTECTOMY    . CORONARY ANGIOPLASTY WITH STENT PLACEMENT  09/10/13   PCI of RCA 90% proximal RCA stenosis with DES  . CORONARY STENT PLACEMENT  09/08/13   PCI with cutting balloon, PTCA,DES to prox LAD  . CYST EXCISION Left    wrist  . DOPPLER ECHOCARDIOGRAPHY  12/31/2012  . LAPAROSCOPIC NISSEN FUNDOPLICATION     for severe reflux  . LEFT HEART CATHETERIZATION  WITH CORONARY ANGIOGRAM N/A 09/08/2013   Procedure: LEFT HEART CATHETERIZATION WITH CORONARY ANGIOGRAM;  Surgeon: Lennette Bihari, MD;  Location: [facility] CATH LAB;  Service: Cardiovascular;  Laterality: N/A;  . NUCLEAR STRESS TEST  05/09/2011   NORMAL PATTREN OF PERFUSION IN ALL REGIONS. LV IS NORMAL IS SIZE. EF 68% NO WALL MOTION ABNORMALITIES.  Marland Kitchen PERCUTANEOUS CORONARY STENT INTERVENTION (PCI-S) N/A 09/10/2013   Procedure: PERCUTANEOUS CORONARY STENT INTERVENTION (PCI-S);  Surgeon: Lennette Bihari, MD;  Location: [facility] CATH LAB;  Service: Cardiovascular;  Laterality: N/A;  . PERIPHERAL VASCULAR INTERVENTION Right 07/24/2016   Procedure: Peripheral Vascular Intervention;  Surgeon: Nada Libman, MD;  Location: [facility] INVASIVE CV LAB;  Service: Cardiovascular;  Laterality: Right;   RT RENAL STENT  LT EXT ILIAC STENT  . STENT PLACE LEFT URETER ([facility] HX)      Social History   Social History  . Marital status: Married    Spouse name: N/A  . Number of children: N/A  . Years of education: N/A   Occupational History  . Not on file.   Social History Main Topics  . Smoking status: Former Smoker    Packs/day: 0.50    Years: 37.00    Types: Cigarettes    Quit date: 09/08/2013  . Smokeless tobacco: Never Used  . Alcohol use No  . Drug use: No  . Sexual activity: Not on file   Other Topics Concern  . Not on file   Social History Narrative  . No narrative on file    Allergies  Allergen Reactions  . Bee Venom Anaphylaxis, Hives and Swelling  . Hydrocodone Other (See Comments)  . Penicillins Other (See Comments)    PATIENT REPORTS "MOTHER TOLD HER SHE HAD ALLERGY TO PCN"  UNKNOWN REACTION TO PCN PCN reaction causing immediate rash, facial/tongue/throat swelling, SOB or lightheadedness with hypotension: unknown PCN reaction causing severe rash involving mucus membranes or skin necrosis: unknown PCN reaction that required hospitalization unknown PCN reaction occurring within the last 10 years:  unknown  . Morphine And Related Rash    Family History  Problem Relation Age of Onset  . Heart attack Father   . Cancer Father        Esophageal  . Heart disease Father        before age 60  . Hypertension Mother   . Heart attack Son 5  . Heart disease Son        before age 1      Prior to Admission medications   Medication Sig Start Date End Date Taking? Authorizing Provider  albuterol (PROVENTIL HFA;VENTOLIN HFA) 108 (90 BASE) MCG/ACT inhaler Inhale 1-2 puffs into the lungs every 4 (four) hours as needed for wheezing or shortness of breath.    Yes [provider]  albuterol (PROVENTIL) (2.5 MG/3ML) 0.083% nebulizer solution Take 2.5 mg by nebulization 2 (two) times daily.    Yes [provider]  ALPRAZolam Prudy Feeler) 0.5 MG tablet Take 0.5 mg by mouth 3 (three) times daily.    Yes [provider]  aspirin 81  MG chewable tablet Chew 81 mg by mouth daily.   Yes [provider]  atorvastatin (LIPITOR) 40 MG tablet take 1 tablet by mouth once daily Patient taking differently: Take 40 mg by mouth daily 09/17/16  Yes Laqueta Linden, MD  BRILINTA 60 MG TABS tablet take 1 tablet by mouth twice a day Patient taking differently: Take 60 mg by mouth twice daily 10/01/16  Yes Laqueta Linden, MD  citalopram (CELEXA) 20 MG tablet Take 20 mg by mouth daily.  06/28/16  Yes [provider]  cyclobenzaprine (FLEXERIL) 10 MG tablet Take 1 tablet (10 mg total) by mouth 3 (three) times daily as needed for muscle spasms.   Yes Lennette Bihari, MD  ferrous gluconate (FERGON) 324 MG tablet Take 324 mg by mouth daily with breakfast.   Yes [provider]  folic acid (FOLVITE) 1 MG tablet take 1 tablet by mouth once daily Patient taking differently: Take 1 mg by mouth once daily 07/20/16  Yes Laqueta Linden, MD  isosorbide mononitrate (IMDUR) 60 MG 24 hr tablet Take 1 tablet (60 mg total) by mouth daily. 05/23/16  Yes Laqueta Linden, MD   lisinopril (PRINIVIL,ZESTRIL) 5 MG tablet take 1 tablet by mouth once daily Patient taking differently: Take 5 mg by mouth once daily 06/07/16  Yes Laqueta Linden, MD  metoprolol succinate (TOPROL-XL) 50 MG 24 hr tablet take 1 tablet by mouth twice a day Patient taking differently: Take 50 mg by mouth twice daily 05/28/16  Yes Laqueta Linden, MD  montelukast (SINGULAIR) 10 MG tablet Take 10 mg by mouth daily.    Yes [provider]  pantoprazole (PROTONIX) 40 MG tablet take 1 tablet by mouth every morning Patient taking differently: Take 40 mg by mouth in the morning 06/07/16  Yes Laqueta Linden, MD  acetaminophen (TYLENOL) 325 MG tablet Take 650 mg by mouth every 6 (six) hours as needed for moderate pain.     [provider]  cholecalciferol (VITAMIN D) 1000 units tablet Take 1,000 Units by mouth daily.     [provider]  diclofenac sodium (VOLTAREN) 1 % GEL Apply 4 g topically 4 (four) times daily. 10/24/16   Maczis, Elmer Sow, PA-C  Melatonin 10 MG TABS Take 10 mg by mouth at bedtime as needed (for sleep).     [provider]  Menthol, Topical Analgesic, (ICY HOT BACK EX) Apply 1 application topically daily as needed (on back).    [provider]  methocarbamol (ROBAXIN) 500 MG tablet Take 1 tablet (500 mg total) by mouth 2 (two) times daily. Patient not taking: Reported on 11/28/2016 10/24/16   Maczis, Elmer Sow, PA-C  nitroGLYCERIN (NITROSTAT) 0.4 MG SL tablet place 1 tablet under the tongue if needed every 5 minutes fo Patient taking differently: place 0.4 mg under the tongue if needed every 5 minutes for chest pain 08/23/16   Lennette Bihari, MD    Physical Exam: Vitals:   11/28/16 0751  BP: (!) 169/102  Pulse: 99  Resp: 20  Temp: 98.7 F (37.1 C)  TempSrc: Oral  SpO2: 93%     General:  Appears calm and comfortable Eyes:  PERRL, EOMI, normal lids, iris ENT:  grossly normal hearing, lips & tongue, mmm Neck:  no  LAD, masses or thyromegaly Cardiovascular:  RRR, no m/r/g. No LE edema.  Respiratory:  CTA bilaterally, no w/r/r. Normal respiratory effort. Abdomen:  soft, ntnd, NABS Skin:  no rash or induration  seen on limited exam Musculoskeletal:  grossly normal tone BUE/BLE, good ROM, no bony abnormality Psychiatric:  grossly normal mood and affect, speech fluent and appropriate, AOx3 Neurologic: Difficulty with speech. Patient typically able to answer questions appropriately though sometimes will add several nonsensical words. Mild dysmetria on the left with finger to nose. Grip strength 5 out of 5 bilateral. Arm flexion and hip flexion 5 out of 5 bilaterally.  Labs on Admission: I have personally reviewed following labs and imaging studies  CBC: No results for input(s): WBC, NEUTROABS, HGB, HCT, MCV, PLT in the last 168 hours. Basic Metabolic Panel: No results for input(s): NA, K, CL, CO2, GLUCOSE, BUN, CREATININE, CALCIUM, MG, PHOS in the last 168 hours. GFR: CrCl cannot be calculated (Patient's most recent lab result is older than the maximum 21 days allowed.). Liver Function Tests: No results for input(s): AST, ALT, ALKPHOS, BILITOT, PROT, ALBUMIN in the last 168 hours. No results for input(s): LIPASE, AMYLASE in the last 168 hours. No results for input(s): AMMONIA in the last 168 hours. Coagulation Profile: No results for input(s): INR, PROTIME in the last 168 hours. Cardiac Enzymes: No results for input(s): CKTOTAL, CKMB, CKMBINDEX, TROPONINI in the last 168 hours. BNP (last 3 results) No results for input(s): PROBNP in the last 8760 hours. HbA1C: No results for input(s): HGBA1C in the last 72 hours. CBG: No results for input(s): GLUCAP in the last 168 hours. Lipid Profile: No results for input(s): CHOL, HDL, LDLCALC, TRIG, CHOLHDL, LDLDIRECT in the last 72 hours. Thyroid Function Tests: No results for input(s): TSH, T4TOTAL, FREET4, T3FREE, THYROIDAB in the last 72 hours. Anemia  Panel: No results for input(s): VITAMINB12, FOLATE, FERRITIN, TIBC, IRON, RETICCTPCT in the last 72 hours. Urine analysis:    Component Value Date/Time   COLORURINE YELLOW 03/01/2016 0456   APPEARANCEUR CLEAR 03/01/2016 0456   LABSPEC 1.014 03/01/2016 0456   PHURINE 5.5 03/01/2016 0456   GLUCOSEU NEGATIVE 03/01/2016 0456   HGBUR TRACE (A) 03/01/2016 0456   BILIRUBINUR NEGATIVE 03/01/2016 0456   KETONESUR NEGATIVE 03/01/2016 0456   PROTEINUR NEGATIVE 03/01/2016 0456   NITRITE NEGATIVE 03/01/2016 0456   LEUKOCYTESUR NEGATIVE 03/01/2016 0456    Creatinine Clearance: CrCl cannot be calculated (Patient's most recent lab result is older than the maximum 21 days allowed.).  Sepsis Labs: @LABRCNTIP (procalcitonin:4,lacticidven:4) )No results found for this or any previous visit (from the past 240 hour(s)).   Radiological Exams on Admission: No results found.  EKG: Independently reviewed. NSR, no ACS (reviewed from [facility])  Assessment/Plan Active Problems:   HTN (hypertension)   Hyperlipidemia   GERD (gastroesophageal reflux disease)   Asthma with COPD   CAD (coronary artery disease), recent DES stents to RCA and LAD in 3/15 and 4/15   Brain mass   Possible Mass: CT at [facility] showing vasogenic edema in the left occipital lobe. This is felt to be secondary to a mass or PRES. Pt noted to have elevated BP. 1 wk of dysarthria, mild LUE dysmetria, purple spots in L visual field, syncope x1, constant HA. Stroke is also a consideration though less likely. - MRI w/ contrast and MRA - BP control as below - Neuro following and appreciate their input.   Syncope: Likely due to mass/abnormality as noted above. No reported szr. No significant metabolic derangements as noted per my review of records from [facility]. Doubt this is due to medication side effect. Patient was somewhat dehydrated per history at time of the event but doubt true orthostasis as  patient has markedly elevated blood  pressure at baseline. - Orthostatic vital signs - Telemetry - Repeat EKG  HTN: pt endorses not taking her blood pressure medications for a couple of days before admission due to not feeling well in general. - Assume lisinopril, metoprolol, Imdur - IV hydralazine when necessary  Anxiety/depression:  - Continue Xanax, Celexa  COPD: Due to years of tobacco abuse. Quit several years ago. No evidence of acute Station. Noted to requirement. - Continue Singulair - Albuterol when necessary  GERD: - Continue Protonix  Chronic pain: - continue flexeril  CAD/MI: EKG without signs of ACS. Peterson syncopal episode keep on telemetry and obtain troponin 1. - Continue Brilinta, Lipitor, ASA 81  H/o AAA: repaired. No sx at this time. No investigative work needed.   Per review of medical records from [facility] him other basic labs of note include creatinine 0.97, glucose 113, WBC 9.7, hemoglobin 11.8. No other significant lab abnormalities noted.   DVT prophylaxis: SCD  Code Status: full  Family Communication: husband  Disposition Plan: pending workup and improvement in condietion  Consults called: neuro  Admission status: inpt    MERRELL, DAVID J MD [facility]  If 7PM-7AM, please contact night-coverage [email or web address] Password [facility]  11/28/2016, 10:37 AM

## 2016-11-28 NOTE — Progress Notes (Signed)
RN resume care from previous RN at this time and pt is currently in MRI.   Sim Boast, RN

## 2016-11-28 NOTE — Progress Notes (Addendum)
Patient arrived back to room from MRI at this time. PT appears lethargic (MRI did spoke with RN r/t her LOC after pre med given for MRI) at this time. Unable to assess NIH at this time due to her LOC. Orthostatic VS completed. Neurologist paged per family request for MRI result.   Sim Boast, RN

## 2016-11-28 NOTE — Care Management Note (Signed)
Case Management Note  Patient Details  Name: Olivia Wade MRN: [id number] Date of Birth: 1950-08-25  Subjective/Objective:   Pt admitted with CVA. She is from home with her spouse.                  Action/Plan: CM following for d/c needs. Physician please order PT/OT evals if appropriate.   Expected Discharge Date:                  Expected Discharge Plan:     In-House Referral:     Discharge planning Services     Post Acute Care Choice:    Choice offered to:     DME Arranged:    DME Agency:     HH Arranged:    HH Agency:     Status of Service:  In process, will continue to follow  If discussed at Long Length of Stay Meetings, dates discussed:    Additional Comments:  Kermit Balo, RN 11/28/2016, 4:14 PM

## 2016-11-28 NOTE — Consult Note (Signed)
NEURO HOSPITALIST CONSULT NOTE   Requestig physician: Dr. Katrinka Blazing  Reason for Consult: Left occipital lobe hypodensity on CT  History obtained from:  Patient     HPI:                                                                                                                                          Olivia Wade is an 66 y.o. female who presents in transfer from [facility]. The patient states that she was in her US[place] when she fell at home due to a syncopal event, striking her forehead. Initially had a frontal headache, which then changed to an occipital headache. The headache has been ongoing for 3 days and was painful enough for her to seek medical attention. The headache is currently 6/10 and nonthrobbing. She has associated "purple spots" involving all fields of vision of her left eye, without any symptoms in her right eye. She does not have associated vision loss. Denies limb weakness but states that she is "weak all over". She denies sensory numbness, difficulty speaking, confusion or trouble walking. States she has some mild neck pain.   CT head at OSH per report showed vasogenic edema in the left occipital lobe which was felt by Radiology to possibly represent a mass lesion. The OSH ED physician, on noting severe HTN, asked the Radiologist if the edema could be due to PRES and was told that the finding was not consistent with such.   The patient was transferred to [facility] for MRI brain in addition to Neurosurgery and Neurology consultations.   Her PMHx includes HTN, HLD, CAD, COPD, AAA, and tobacco abuse.     Past Medical History:  Diagnosis Date  . AAA (abdominal aortic aneurysm) (HCC)   . Anxiety    Panic attacks  . Arthritis   . Asthma   . Chronic low back pain    (Old vertebral fracture)  . Complication of anesthesia   . COPD (chronic obstructive pulmonary disease) (HCC)   . Coronary artery disease 09/08/13   with PCI  . Depression   . Family history  of adverse reaction to anesthesia     mother - slow to awaken  . GERD (gastroesophageal reflux disease)   . Heart palpitations    prn toprol for rapid HR  . History of surgery on arm   . HLD (hyperlipidemia)   . HTN (hypertension)   . NSTEMI (non-ST elevated myocardial infarction) (HCC) 09/08/13  . PONV (postoperative nausea and vomiting)   . Renal disorder    ischemic kidney   . Tobacco abuse     Past Surgical History:  Procedure Laterality Date  . 2d echo  12/2012   normal LV with mild LVH, grade 1 diastolic dysfunction  . ABDOMINAL AORTIC ANEURYSM  REPAIR  12/02/2015   ABDOMINAL AORTIC ENDOVASCULAR FENESTRATED STENT GRAFT (N/A)  . ABDOMINAL AORTIC DUPLEX  05/19/2012   WHEN COMPARED TO PRIOR STUDY DATED 06/01/11 THERE IS AN INCREASE IN SIZE OF DILATATION.  Marland Kitchen ABDOMINAL AORTIC ENDOVASCULAR FENESTRATED STENT GRAFT N/A 12/02/2015   Procedure: ABDOMINAL AORTIC ENDOVASCULAR FENESTRATED STENT GRAFT;  Surgeon: Nada Libman, MD;  Location: [facility] OR;  Service: Vascular;  Laterality: N/A;  . ABDOMINAL AORTOGRAM N/A 07/24/2016   Procedure: Abdominal Aortogram;  Surgeon: Nada Libman, MD;  Location: [facility] INVASIVE CV LAB;  Service: Cardiovascular;  Laterality: N/A;  . ABDOMINAL HYSTERECTOMY    . CHOLECYSTECTOMY    . CORONARY ANGIOPLASTY WITH STENT PLACEMENT  09/10/13   PCI of RCA 90% proximal RCA stenosis with DES  . CORONARY STENT PLACEMENT  09/08/13   PCI with cutting balloon, PTCA,DES to prox LAD  . CYST EXCISION Left    wrist  . DOPPLER ECHOCARDIOGRAPHY  12/31/2012  . LAPAROSCOPIC NISSEN FUNDOPLICATION     for severe reflux  . LEFT HEART CATHETERIZATION WITH CORONARY ANGIOGRAM N/A 09/08/2013   Procedure: LEFT HEART CATHETERIZATION WITH CORONARY ANGIOGRAM;  Surgeon: Lennette Bihari, MD;  Location: [facility] CATH LAB;  Service: Cardiovascular;  Laterality: N/A;  . NUCLEAR STRESS TEST  05/09/2011   NORMAL PATTREN OF PERFUSION IN ALL REGIONS. LV IS NORMAL IS SIZE. EF 68% NO WALL MOTION ABNORMALITIES.   Marland Kitchen PERCUTANEOUS CORONARY STENT INTERVENTION (PCI-S) N/A 09/10/2013   Procedure: PERCUTANEOUS CORONARY STENT INTERVENTION (PCI-S);  Surgeon: Lennette Bihari, MD;  Location[facility] CATH LAB;  Service: Cardiovascular;  Laterality: N/A;  . PERIPHERAL VASCULAR INTERVENTION Right 07/24/2016   Procedure: Peripheral Vascular Intervention;  Surgeon: Nada Libman, MD;  Location: [facility] INVASIVE CV LAB;  Service: Cardiovascular;  Laterality: Right;   RT RENAL STENT  LT EXT ILIAC STENT  . STENT PLACE LEFT URETER ([facility] HX)      Family History  Problem Relation Age of Onset  . Heart attack Father   . Cancer Father        Esophageal  . Heart disease Father        before age 30  . Hypertension Mother   . Heart attack Son 52  . Heart disease Son        before age 31   Social History:  reports that she quit smoking about 3 years ago. Her smoking use included Cigarettes. She has a 18.50 pack-year smoking history. She has never used smokeless tobacco. She reports that she does not drink alcohol or use drugs.  Allergies  Allergen Reactions  . Bee Venom Anaphylaxis, Hives and Swelling  . Hydrocodone Other (See Comments)  . Penicillins Other (See Comments)    PATIENT REPORTS "MOTHER TOLD HER SHE HAD ALLERGY TO PCN"  UNKNOWN REACTION TO PCN PCN reaction causing immediate rash, facial/tongue/throat swelling, SOB or lightheadedness with hypotension: unknown PCN reaction causing severe rash involving mucus membranes or skin necrosis: unknown PCN reaction that required hospitalization unknown PCN reaction occurring within the last 10 years: unknown  . Morphine And Related Rash    HOME MEDICATIONS:  acetaminophen (TYLENOL) 325 MG tablet Take 650 mg by mouth every 6 (six) hours as needed for moderate pain.  [provider] Needs Review  albuterol (PROVENTIL HFA;VENTOLIN HFA) 108 (90 BASE)  MCG/ACT inhaler Inhale 1 puff into the lungs every 6 (six) hours as needed for wheezing or shortness of breath. [provider] Needs Review  albuterol (PROVENTIL) (2.5 MG/3ML) 0.083% nebulizer solution Take 2.5 mg by nebulization every 6 (six) hours as needed for wheezing or shortness of breath. [provider] Needs Review  ALPRAZolam (XANAX) 0.5 MG tablet Take 0.5 mg by mouth 3 (three) times daily as needed for anxiety or sleep. *Patient is prescribed one tablets three times daily as needed for anxiety [provider] Needs Review  aspirin 81 MG chewable tablet Chew 81 mg by mouth daily. [provider] Needs Review  atorvastatin (LIPITOR) 40 MG tablet take 1 tablet by mouth once daily Laqueta Linden, MD Needs Review  BRILINTA 60 MG TABS tablet take 1 tablet by mouth twice a day Laqueta Linden, MD Needs Review  cholecalciferol (VITAMIN D) 1000 units tablet Take 1 tablet by mouth daily. [provider] Needs Review  citalopram (CELEXA) 40 MG tablet Take 40 mg by mouth daily. [provider] Needs Review  cyclobenzaprine (FLEXERIL) 10 MG tablet Take 1 tablet (10 mg total) by mouth 3 (three) times daily as needed for muscle spasms. Lennette Bihari, MD Needs Review  diclofenac sodium (VOLTAREN) 1 % GEL Apply 4 g topically 4 (four) times daily. Maczis, Elmer Sow, PA-C Needs Review  ferrous sulfate 325 (65 FE) MG tablet Take 325 mg by mouth daily with breakfast.  [provider] Needs Review  folic acid (FOLVITE) 1 MG tablet take 1 tablet by mouth once daily Laqueta Linden, MD Needs Review  isosorbide mononitrate (IMDUR) 60 MG 24 hr tablet Take 1 tablet (60 mg total) by mouth daily. Laqueta Linden, MD Needs Review  lisinopril (PRINIVIL,ZESTRIL) 5 MG tablet take 1 tablet by mouth once daily Laqueta Linden, MD Needs Review  Melatonin 10 MG TABS Take 1 tablet by mouth at bedtime as needed. [provider]  Needs Review  Menthol, Topical Analgesic, (ICY HOT BACK EX) Apply 1 application topically daily as needed (on back). [provider] Needs Review  methocarbamol (ROBAXIN) 500 MG tablet Take 1 tablet (500 mg total) by mouth 2 (two) times daily. Maczis, Elmer Sow, PA-C Needs Review  metoprolol succinate (TOPROL-XL) 50 MG 24 hr tablet take 1 tablet by mouth twice a day Laqueta Linden, MD Needs Review  montelukast (SINGULAIR) 10 MG tablet Take 10 mg by mouth daily.  [provider] Needs Review  nitroGLYCERIN (NITROSTAT) 0.4 MG SL tablet place 1 tablet under the tongue if needed every 5 minutes fo Lennette Bihari, MD Needs Review  pantoprazole (PROTONIX) 40 MG tablet take 1 tablet by mouth every morning Laqueta Linden, MD Needs     ROS:  Denies chest pain. Positive for mild abdominal pain. Other ROS as per HPI.   There were no vitals taken for this visit.  General Examination:                                                                                                      HEENT-  [place]/AT  Lungs- Respirations unlabored Extremities- No edema  Neurological Examination Mental Status: Alert, fully oriented, thought content appropriate.  Speech fluent without evidence of aphasia.  Able to follow all commands without difficulty.  Cranial Nerves: II: Visual fields intact all 4 quadrants OU with each eye tested individually. PERRL.   III,IV, VI: ptosis not present, EOMI without nystagmus V,VII: smile symmetric, facial temp sensation normal bilaterally VIII: hearing intact to voice IX,X: palate rises symmetrically XI: symmetric shoulder shrug XII: midline tongue extension Motor: Right : Upper extremity   5/5    Left:     Upper extremity   5/5  Lower extremity   5/5     Lower extremity   5/5 Normal tone throughout; no atrophy  noted Sensory: Decreased temperature sensation RUE, otherwise temp sensation normal. FT intact with no extinction.   Deep Tendon Reflexes: 2+ bilateral biceps, brachioradialis, patellae and achilles. Toes downgoing.   Cerebellar: No ataxia with FNF bilaterally Gait: Deferred  Lab Results: Basic Metabolic Panel: No results for input(s): NA, K, CL, CO2, GLUCOSE, BUN, CREATININE, CALCIUM, MG, PHOS in the last 168 hours.  Liver Function Tests: No results for input(s): AST, ALT, ALKPHOS, BILITOT, PROT, ALBUMIN in the last 168 hours. No results for input(s): LIPASE, AMYLASE in the last 168 hours. No results for input(s): AMMONIA in the last 168 hours.  CBC: No results for input(s): WBC, NEUTROABS, HGB, HCT, MCV, PLT in the last 168 hours.  Cardiac Enzymes: No results for input(s): CKTOTAL, CKMB, CKMBINDEX, TROPONINI in the last 168 hours.  Lipid Panel: No results for input(s): CHOL, TRIG, HDL, CHOLHDL, VLDL, LDLCALC in the last 168 hours.  CBG: No results for input(s): GLUCAP in the last 168 hours.  Microbiology: Results for orders placed or performed during the hospital encounter of 03/01/16  Culture, Urine     Status: Abnormal   Collection Time: 03/01/16  4:56 AM  Result Value Ref Range Status   Specimen Description URINE, RANDOM  Final   Special Requests NONE  Final   Culture 30,000 COLONIES/mL ENTEROCOCCUS FAECALIS (A)  Final   Report Status 03/03/2016 FINAL  Final   Organism ID, Bacteria ENTEROCOCCUS FAECALIS (A)  Final      Susceptibility   Enterococcus faecalis - MIC*    AMPICILLIN <=2 SENSITIVE Sensitive     LEVOFLOXACIN 1 SENSITIVE Sensitive     NITROFURANTOIN <=16 SENSITIVE Sensitive     VANCOMYCIN 1 SENSITIVE Sensitive     * 30,000 COLONIES/mL ENTEROCOCCUS FAECALIS    Coagulation Studies: No results for input(s): LABPROT, INR in the last 72 hours.  Imaging: No results found.  Assessment/Recommendations: 66 year old female status post fall at home with  persistent occipital headache. Hypodensity suspicious for possible mass was seen  within her left occipital lobe on OSH CT. 1. MRI brain with and without contrast. 2. If mass is seen on MRI, call Neurosurgery 3. If PRES is seen on MRI, then aggressive BP management 4. If stroke is seen on MRI, then full stroke work up  Electronically signed: Dr. Caryl Pina 11/28/2016, 7:16 AM

## 2016-11-29 ENCOUNTER — Inpatient Hospital Stay (HOSPITAL_COMMUNITY): Payer: BLUE CROSS/BLUE SHIELD

## 2016-11-29 DIAGNOSIS — Z01810 Encounter for preprocedural cardiovascular examination: Secondary | ICD-10-CM

## 2016-11-29 DIAGNOSIS — E7800 Pure hypercholesterolemia, unspecified: Secondary | ICD-10-CM

## 2016-11-29 DIAGNOSIS — I2510 Atherosclerotic heart disease of native coronary artery without angina pectoris: Secondary | ICD-10-CM

## 2016-11-29 LAB — BASIC METABOLIC PANEL
Anion gap: 7 (ref 5–15)
BUN: 7 mg/dL (ref 6–20)
CALCIUM: 8.6 mg/dL — AB (ref 8.9–10.3)
CO2: 25 mmol/L (ref 22–32)
Chloride: 104 mmol/L (ref 101–111)
Creatinine, Ser: 1.06 mg/dL — ABNORMAL HIGH (ref 0.44–1.00)
GFR calc Af Amer: >60 mL/min (ref >60)
GFR, EST NON AFRICAN AMERICAN: 54 mL/min — AB (ref >60)
GLUCOSE: 110 mg/dL — AB (ref 65–99)
Potassium: 3.4 mmol/L — ABNORMAL LOW (ref 3.5–5.1)
Sodium: 136 mmol/L (ref 135–145)

## 2016-11-29 LAB — CBC
HCT: 35.6 % — ABNORMAL LOW (ref 36.0–46.0)
Hemoglobin: 11.3 g/dL — ABNORMAL LOW (ref 12.0–15.0)
MCH: 29.7 pg (ref 26.0–34.0)
MCHC: 31.7 g/dL (ref 30.0–36.0)
MCV: 93.7 fL (ref 78.0–100.0)
PLATELETS: 342 10*3/uL (ref 150–400)
RBC: 3.8 MIL/uL — ABNORMAL LOW (ref 3.87–5.11)
RDW: 15.3 % (ref 11.5–15.5)
WBC: 7.8 10*3/uL (ref 4.0–10.5)

## 2016-11-29 LAB — CSF CELL COUNT WITH DIFFERENTIAL
RBC Count, CSF: 0 /mm3
RBC Count, CSF: 2 /mm3 — ABNORMAL HIGH
TUBE #: 1
Tube #: 4
WBC CSF: 1 /mm3 (ref 0–5)
WBC, CSF: 1 /mm3 (ref 0–5)

## 2016-11-29 LAB — HIV ANTIBODY (ROUTINE TESTING W REFLEX): HIV Screen 4th Generation wRfx: NONREACTIVE

## 2016-11-29 LAB — PROTEIN AND GLUCOSE, CSF
GLUCOSE CSF: 59 mg/dL (ref 40–70)
TOTAL PROTEIN, CSF: 65 mg/dL — AB (ref 15–45)

## 2016-11-29 MED ORDER — LISINOPRIL 20 MG PO TABS
20.0000 mg | ORAL_TABLET | Freq: Every day | ORAL | Status: DC
Start: 2016-11-29 — End: 2016-11-30
  Administered 2016-11-29 – 2016-11-30 (×2): 20 mg via ORAL
  Filled 2016-11-29 (×2): qty 1

## 2016-11-29 MED ORDER — STROKE: EARLY STAGES OF RECOVERY BOOK
Freq: Once | Status: AC
  Administered 2016-11-29: 08:00:00

## 2016-11-29 MED ORDER — ASPIRIN 81 MG PO CHEW
81.0000 mg | CHEWABLE_TABLET | Freq: Every day | ORAL | Status: DC
  Administered 2016-11-29 – 2016-11-30 (×2): 81 mg via ORAL
  Filled 2016-11-29 (×2): qty 1

## 2016-11-29 MED ORDER — IOPAMIDOL (ISOVUE-370) INJECTION 76%
INTRAVENOUS | Status: AC
  Filled 2016-11-29: qty 50

## 2016-11-29 MED ORDER — POTASSIUM CHLORIDE IN NACL 40-0.9 MEQ/L-% IV SOLN
INTRAVENOUS | Status: DC
  Administered 2016-11-29: 75 mL/h via INTRAVENOUS
  Filled 2016-11-29 (×2): qty 1000

## 2016-11-29 NOTE — Procedures (Signed)
Indication: evaluate for abnormality on MRI and vasculitis  Risks of the procedure were dicussed with the patient including post-LP headache, bleeding, infection, weakness/numbness of legs(radiculopathy), death.  The patient agreed and written consent was obtained.   The patient was prepped and draped, and using sterile technique a 20 gauge quinke spinal needle was inserted in the L4/5space. The opening pressure was 19 mmHg. Approximately 12 cc of CSF were obtained and sent for analysis. 4 attempts were made before obtaining CSF. No complications.   Felicie Morn PA-C [facility] [phone]  M-F  (8:30 am- 4 PM)  11/29/2016, 10:11 AM

## 2016-11-29 NOTE — Progress Notes (Signed)
Subjective: No current HA  Exam: Vitals:   11/29/16 0242 11/29/16 0447  BP: (!) 157/70 (!) 155/79  Pulse:  95  Resp:  20  Temp:  98.6 F (37 C)    HEENT-  Normocephalic, no lesions, without obvious abnormality.  Normal external eye and conjunctiva.  Normal TM's bilaterally.  Normal auditory canals and external ears. Normal external nose, mucus membranes and septum.  Normal pharynx.   Neuro:  CN: Pupils are equal and round. They are symmetrically reactive from 3-->2 mm. EOMI without nystagmus. Facial sensation is intact to light touch. Face is symmetric at rest with normal strength and mobility. Hearing is intact to conversational voice. Palate elevates symmetrically and uvula is midline. Voice is normal in tone, pitch and quality. Bilateral SCM and trapezii are 5/5. Tongue is midline with normal bulk and mobility.  Motor: Normal bulk, tone, and strength. 5/5 throughout. No drift.  Sensation: Intact to light touch.  DTRs: 2+, symmetric  Toes downgoing bilaterally. No pathologic reflexes.  Coordination: Finger-to-nose and heel-to-shin are without dysmetria     Pertinent Labs/Diagnostics: K 3.4  MRI brain w/WO contrast IMPRESSION: 1. Punctate focus of acute ischemia within the left medial frontal gyrus without acute hemorrhage or mass effect. 2. Vasogenic edema within the left occipital lobe without associated contrast enhancement. This may indicate a neoplasm such as a low grade glioma. If there are remote prior imaging studies of the brain, comparison would be helpful. A second possibility is a relatively recent, early chronic infarct. Follow-up imaging in approximately 12 weeks is recommended, as the presence or absence of associated volume loss would aid in differentiating the two. 3. 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 4. Normal intracranial MRA.  LP performed and awaiting labs.     Felicie Morn PA-C [facility] [phone]  Impression: 66 year old female status post fall at home with persistent occipital headache. Hypodensity suspicious for possible mass was seen within her left occipital lobe. Given that there is possible vasogenic edema, I have considered starting patient on steroids, but her symptoms are relatively mild and even if this is vasogenic edema I'm not certain they are absolutely necessary at this time. Given that I am not certain of the indication, I would favor holding off for now.  One other possibility would be an inflammatory component to this process, though less likely given the lack of enhancement. If there was clear evidence of inflammation on lumbar puncture, however, then this may push me towards to IV steroids.   Recommendations: Lumbar puncture for cells, glucose, protein, IgG index, cytology Stroke workup underway  Ritta Slot, MD [facility] [phone]  If 7pm- 7am, please page neurology on call as listed in AMION.  11/29/2016, 10:12 AM

## 2016-11-29 NOTE — Progress Notes (Signed)
Subjective: Patient seen in consultation yesterday for area of vasogenic edema in the left occipital lobe. Olivia Wade remains uncertain, but the greater likelihood is that it's of a cerebrovascular nature rather than neoplastic nature. Patient is being followed by the neurology service and will need to continue to be followed by the stroke neurology service following discharge.  Patient was seen by Dr. Olga Millers in cardiology consultation, who cleared the patient for surgery and general anesthesia if necessary, and for holding the Brillinta perioperatively.  Objective: Vital signs in last 24 hours: Vitals:   11/29/16 0242 11/29/16 0447 11/29/16 1000 11/29/16 1401  BP: (!) 157/70 (!) 155/79 (!) 165/110 139/74  Pulse:  95 85 85  Resp:  20 20 18   Temp:  98.6 F (37 C) 98.3 F (36.8 C) 97.8 F (36.6 C)  TempSrc:  Oral Oral Oral  SpO2:  95% 93% 95%    Intake/Output from previous day: 06/20 0701 - 06/21 0700 In: 1760 [P.O.:840; I.V.:920] Out: -  Intake/Output this shift: No intake/output data recorded.  Physical Exam:  Awake and alert, fully oriented to name, [facility], [place], and June 2018. Following commands. Speech fluent. Pupils equal, round, 3.5 mm in diameter, and reactive to light. EOMI. Facial movements symmetrical. 5/5 strength in upper and lower extremities. No drift of upper extremities.  CBC  Recent Labs  11/28/16 1045 11/29/16 0633  WBC 7.2 7.8  HGB 11.0* 11.3*  HCT 34.7* 35.6*  PLT 335 342   BMET  Recent Labs  11/28/16 1045 11/29/16 0633  NA 136 136  K 3.4* 3.4*  CL 104 104  CO2 27 25  GLUCOSE 122* 110*  BUN 6 7  CREATININE 0.96 1.06*  CALCIUM 8.8* 8.6*    Studies/Results: Ct Angio Head W Or Wo Contrast  Result Date: 11/29/2016 CLINICAL DATA:  Blurred vision of the left eye. Punctate infarct of the anterior left frontal lobe. EXAM: CT ANGIOGRAPHY HEAD AND NECK TECHNIQUE: Multidetector CT imaging of the head and neck was performed  using the standard protocol during bolus administration of intravenous contrast. Multiplanar CT image reconstructions and MIPs were obtained to evaluate the vascular anatomy. Carotid stenosis measurements (when applicable) are obtained utilizing NASCET criteria, using the distal internal carotid diameter as the denominator. CONTRAST:  50 mL Isovue 370 COMPARISON:  MRI brain from the same day. FINDINGS: CT HEAD FINDINGS Brain: Asymmetric subcortical white matter hypoattenuation is again noted in the left occipital lobe. This extends in a posterior left temporal lobe, unchanged from the MRI. Extensive periventricular and subcortical white matter changes are advanced for age. Vascular: Atherosclerotic calcifications are present in the cavernous internal carotid artery's bilaterally. There is no hyperdense vessel. Skull: No focal lytic or blastic lesions are present. The calvarium is intact. Sinuses: The paranasal sinuses are clear. Fluid is again noted in the mastoid air cells, left greater than right. No obstructing nasopharyngeal lesion is present. Orbits: The globes and orbits are within normal limits bilaterally. Review of the MIP images confirms the above findings CTA NECK FINDINGS Aortic arch: There is a common origin of the innominate and left common carotid artery's. Atherosclerotic plaque is noted on the right lateral aspect of the common origin without a significant stenosis. Additional calcifications are present without aneurysm or stenosis. Right carotid system: The right common carotid artery is within normal limits. And carotid bifurcation is unremarkable. There is some atherosclerotic change the proximal left right internal carotid artery without a significant stenosis relative to the more distal vessel. Left  carotid system: The left common carotid artery is within normal limits. Minimal atherosclerotic changes are present at bifurcation without significant stenosis. Mild tortuosity is present in the  cervical ICA without a significant stenosis of the more distal vessel. Vertebral arteries: The vertebral arteries both originate from the subclavian arteries. The vertebral arteries are codominant. There is no significant stenosis of either vertebral artery in the neck. Skeleton: Endplate degenerative changes are most pronounced at C5-6 and C6-7. There is slight degenerative anterolisthesis at C4-5. Facet disease is noted at C2-3, C3-4, and C4-5. No focal lytic or blastic lesions are present. Other neck: The thyroid is within normal limits. No focal mucosal disease is present. The salivary glands are normal. There is no significant adenopathy. Upper chest: Centrilobular emphysema is noted. There is mild dependent atelectasis bilaterally. No focal nodule, mass, or airspace disease is present. Coronary artery calcifications are present. Review of the MIP images confirms the above findings CTA HEAD FINDINGS Anterior circulation: Atherosclerotic calcifications are present within the left cavernous internal carotid artery without a significant stenosis. The internal carotid artery's are otherwise within normal limits to the ICA termini bilaterally. The A1 and M1 segments are normal. The anterior communicating artery is patent. The MCA bifurcations are intact bilaterally. ACA and MCA branch vessels are unremarkable. Posterior circulation: The vertebral arteries are codominant. The PICA origins are visualized and normal. Both posterior cerebral arteries originate from the basilar tip. The PCA branch vessels are within normal limits. Venous sinuses: The dural sinuses are patent. The straight sinus and deep cerebral veins are intact. Cortical veins are unremarkable. Anatomic variants: None Delayed phase: The postcontrast images exaggerate the white matter changes in the left occipital and temporal lobe. No focal enhancement is present. Review of the MIP images confirms the above findings IMPRESSION: 1. Mild atherosclerotic  changes at the carotid bifurcations and proximal internal carotid artery's bilaterally without significant stenosis. 2. Mild atherosclerotic changes at the aortic arch as described. 3. No significant proximal stenosis, aneurysm, or branch vessel occlusion within the circle of Willis. 4. Asymmetric white matter hypoattenuation involving the left occipital lobe as described. The differential diagnosis includes asymmetric posterior reversible encephalopathy syndrome. Recent ischemia is also considered. Neoplasm is considered less likely. Given the extensive white matter disease elsewhere, this likely is vasogenic in origin. Follow-up MRI as previously recommended would be helpful. Electronically Signed   By: Marin Roberts M.D.   On: 11/29/2016 14:44   Dg Chest 2 View  Result Date: 11/28/2016 CLINICAL DATA:  Syncope EXAM: CHEST  2 VIEW COMPARISON:  May 10, 2016 FINDINGS: There is no edema or consolidation. Heart is upper normal in size with pulmonary vascularity within normal limits. No adenopathy. There is an abdominal aortic stent graft. There is aortic atherosclerosis. No evident bone lesions. IMPRESSION: No edema or consolidation. There is aortic atherosclerosis. Abdominal aortic stent graft present. Electronically Signed   By: Bretta Bang III M.D.   On: 11/28/2016 13:55   Ct Angio Neck W Or Wo Contrast  Result Date: 11/29/2016 CLINICAL DATA:  Blurred vision of the left eye. Punctate infarct of the anterior left frontal lobe. EXAM: CT ANGIOGRAPHY HEAD AND NECK TECHNIQUE: Multidetector CT imaging of the head and neck was performed using the standard protocol during bolus administration of intravenous contrast. Multiplanar CT image reconstructions and MIPs were obtained to evaluate the vascular anatomy. Carotid stenosis measurements (when applicable) are obtained utilizing NASCET criteria, using the distal internal carotid diameter as the denominator. CONTRAST:  50 mL Isovue 370  COMPARISON:   MRI brain from the same day. FINDINGS: CT HEAD FINDINGS Brain: Asymmetric subcortical white matter hypoattenuation is again noted in the left occipital lobe. This extends in a posterior left temporal lobe, unchanged from the MRI. Extensive periventricular and subcortical white matter changes are advanced for age. Vascular: Atherosclerotic calcifications are present in the cavernous internal carotid artery's bilaterally. There is no hyperdense vessel. Skull: No focal lytic or blastic lesions are present. The calvarium is intact. Sinuses: The paranasal sinuses are clear. Fluid is again noted in the mastoid air cells, left greater than right. No obstructing nasopharyngeal lesion is present. Orbits: The globes and orbits are within normal limits bilaterally. Review of the MIP images confirms the above findings CTA NECK FINDINGS Aortic arch: There is a common origin of the innominate and left common carotid artery's. Atherosclerotic plaque is noted on the right lateral aspect of the common origin without a significant stenosis. Additional calcifications are present without aneurysm or stenosis. Right carotid system: The right common carotid artery is within normal limits. And carotid bifurcation is unremarkable. There is some atherosclerotic change the proximal left right internal carotid artery without a significant stenosis relative to the more distal vessel. Left carotid system: The left common carotid artery is within normal limits. Minimal atherosclerotic changes are present at bifurcation without significant stenosis. Mild tortuosity is present in the cervical ICA without a significant stenosis of the more distal vessel. Vertebral arteries: The vertebral arteries both originate from the subclavian arteries. The vertebral arteries are codominant. There is no significant stenosis of either vertebral artery in the neck. Skeleton: Endplate degenerative changes are most pronounced at C5-6 and C6-7. There is slight  degenerative anterolisthesis at C4-5. Facet disease is noted at C2-3, C3-4, and C4-5. No focal lytic or blastic lesions are present. Other neck: The thyroid is within normal limits. No focal mucosal disease is present. The salivary glands are normal. There is no significant adenopathy. Upper chest: Centrilobular emphysema is noted. There is mild dependent atelectasis bilaterally. No focal nodule, mass, or airspace disease is present. Coronary artery calcifications are present. Review of the MIP images confirms the above findings CTA HEAD FINDINGS Anterior circulation: Atherosclerotic calcifications are present within the left cavernous internal carotid artery without a significant stenosis. The internal carotid artery's are otherwise within normal limits to the ICA termini bilaterally. The A1 and M1 segments are normal. The anterior communicating artery is patent. The MCA bifurcations are intact bilaterally. ACA and MCA branch vessels are unremarkable. Posterior circulation: The vertebral arteries are codominant. The PICA origins are visualized and normal. Both posterior cerebral arteries originate from the basilar tip. The PCA branch vessels are within normal limits. Venous sinuses: The dural sinuses are patent. The straight sinus and deep cerebral veins are intact. Cortical veins are unremarkable. Anatomic variants: None Delayed phase: The postcontrast images exaggerate the white matter changes in the left occipital and temporal lobe. No focal enhancement is present. Review of the MIP images confirms the above findings IMPRESSION: 1. Mild atherosclerotic changes at the carotid bifurcations and proximal internal carotid artery's bilaterally without significant stenosis. 2. Mild atherosclerotic changes at the aortic arch as described. 3. No significant proximal stenosis, aneurysm, or branch vessel occlusion within the circle of Willis. 4. Asymmetric white matter hypoattenuation involving the left occipital lobe as  described. The differential diagnosis includes asymmetric posterior reversible encephalopathy syndrome. Recent ischemia is also considered. Neoplasm is considered less likely. Given the extensive white matter disease elsewhere, this likely is vasogenic in  origin. Follow-up MRI as previously recommended would be helpful. Electronically Signed   By: Marin Roberts M.D.   On: 11/29/2016 14:44   Mr Maxine Glenn Head Wo Contrast  Result Date: 11/28/2016 CLINICAL DATA:  Syncope.  Potential stroke. EXAM: MRI HEAD WITHOUT AND WITH CONTRAST MRA HEAD WITHOUT CONTRAST TECHNIQUE: Multiplanar, multiecho pulse sequences of the brain and surrounding structures were obtained without and with intravenous contrast. Angiographic images of the head were obtained using MRA technique without contrast. CONTRAST:  15 mL MULTIHANCE GADOBENATE DIMEGLUMINE 529 MG/ML IV SOLN COMPARISON:  Head CT 11/28/2016 FINDINGS: MRI HEAD FINDINGS Brain: There is a partially empty sella. There is a punctate focus of diffusion restriction within the parasagittal left frontal lobe, along the base of the left medial frontal gyrus. No acute hemorrhage. No mass effect. There is multifocal hyperintense T2-weighted signal within the periventricular white matter, most often seen in the setting of chronic microvascular ischemia. There is a volume positive area of hyperintense T2 weighted signal in the left occipital lobe that spares the cortex in a vasogenic edema pattern. There is no associated contrast enhancement. There are multiple old lacunar infarcts of the right basal ganglia and corona radiata. No chronic microhemorrhage or cerebral amyloid angiopathy. No hydrocephalus, age advanced atrophy or lobar predominant volume loss. No dural abnormality or extra-axial collection. Skull and upper cervical spine: The visualized skull base, calvarium, upper cervical spine and extracranial soft tissues are normal. Sinuses/Orbits: No fluid levels or advanced mucosal  thickening. No mastoid effusion. Normal orbits. MRA HEAD FINDINGS Intracranial internal carotid arteries: Normal. Anterior cerebral arteries: Normal. Middle cerebral arteries: Normal. Posterior communicating arteries: Absent bilaterally. Posterior cerebral arteries: Normal. Basilar artery: Normal. Vertebral arteries: Codominant. Normal. Superior cerebellar arteries: Normal. Anterior inferior cerebellar arteries: Normal. Posterior inferior cerebellar arteries: Normal. IMPRESSION: 1. Punctate focus of acute ischemia within the left medial frontal gyrus without acute hemorrhage or mass effect. 2. Vasogenic edema within the left occipital lobe without associated contrast enhancement. This may indicate a neoplasm such as a low grade glioma. If there are remote prior imaging studies of the brain, comparison would be helpful. A second possibility is a relatively recent, early chronic infarct. Follow-up imaging in approximately 12 weeks is recommended, as the presence or absence of associated volume loss would aid in differentiating the two. 3. 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 4. Normal intracranial MRA. Electronically Signed   By: Deatra Robinson M.D.   On: 11/28/2016 13:50   Mr Laqueta Jean RU Contrast  Result Date: 11/28/2016 CLINICAL DATA:  Syncope.  Potential stroke. EXAM: MRI HEAD WITHOUT AND WITH CONTRAST MRA HEAD WITHOUT CONTRAST TECHNIQUE: Multiplanar, multiecho pulse sequences of the brain and surrounding structures were obtained without and with intravenous contrast. Angiographic images of the head were obtained using MRA technique without contrast. CONTRAST:  15 mL MULTIHANCE GADOBENATE DIMEGLUMINE 529 MG/ML IV SOLN COMPARISON:  Head CT 11/28/2016 FINDINGS: MRI HEAD FINDINGS Brain: There is a partially empty sella. There is a punctate focus of diffusion restriction within the parasagittal left frontal lobe, along the base of the left medial frontal gyrus. No  acute hemorrhage. No mass effect. There is multifocal hyperintense T2-weighted signal within the periventricular white matter, most often seen in the setting of chronic microvascular ischemia. There is a volume positive area of hyperintense T2 weighted signal in the left occipital lobe that spares the cortex in a vasogenic edema pattern. There is no associated contrast enhancement. There are multiple old lacunar infarcts of  the right basal ganglia and corona radiata. No chronic microhemorrhage or cerebral amyloid angiopathy. No hydrocephalus, age advanced atrophy or lobar predominant volume loss. No dural abnormality or extra-axial collection. Skull and upper cervical spine: The visualized skull base, calvarium, upper cervical spine and extracranial soft tissues are normal. Sinuses/Orbits: No fluid levels or advanced mucosal thickening. No mastoid effusion. Normal orbits. MRA HEAD FINDINGS Intracranial internal carotid arteries: Normal. Anterior cerebral arteries: Normal. Middle cerebral arteries: Normal. Posterior communicating arteries: Absent bilaterally. Posterior cerebral arteries: Normal. Basilar artery: Normal. Vertebral arteries: Codominant. Normal. Superior cerebellar arteries: Normal. Anterior inferior cerebellar arteries: Normal. Posterior inferior cerebellar arteries: Normal. IMPRESSION: 1. Punctate focus of acute ischemia within the left medial frontal gyrus without acute hemorrhage or mass effect. 2. Vasogenic edema within the left occipital lobe without associated contrast enhancement. This may indicate a neoplasm such as a low grade glioma. If there are remote prior imaging studies of the brain, comparison would be helpful. A second possibility is a relatively recent, early chronic infarct. Follow-up imaging in approximately 12 weeks is recommended, as the presence or absence of associated volume loss would aid in differentiating the two. 3. Chronic microvascular ischemia and multiple old right basal  ganglia lacunar infarcts. 4. Normal intracranial MRA. Electronically Signed   By: Deatra Robinson M.D.   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 It is more likely cerebrovascular than neoplastic in origin, but will need to continue to be followed. We'll want patient seen in neuro-oncology consultation by Dr. Celene Kras after he begins practice at the [facility] on July 9.  Spoke with the patient and her husband, who is at her bedside, regarding our assessment and recommendations. Their questions were answered for them.   Hewitt Shorts, MD 11/29/2016, 6:06 PM

## 2016-11-29 NOTE — Consult Note (Signed)
Cardiology Consult    Patient ID: Olivia Wade MRN: [id number], DOB/AGE: [birth year]   Admit date: 11/28/2016 Date of Consult: 11/29/2016  Primary Physician: Louie Boston., MD Primary Cardiologist: dr. Purvis Sheffield Requesting Provider: Dr. Susie Wade  Reason for Consult: preoperative risk assessment  Patient Profile    Olivia Wade has a PMH significant for tobacco use, NSTEMI s/p PCI with DES to RCA and LAD (08/2013, 09/2013), HTN, HLD, GERD, COPD, asthma, AAA, and anxiety. Neurosurgery plans for brain biopsy of an occipital mass.   Olivia Wade is a 66 y.o. female who is being seen today for the evaluation of preoperative risk assessment at the request of Olivia Wade.   Past Medical History   Past Medical History:  Diagnosis Date  . AAA (abdominal aortic aneurysm) (HCC)   . Anxiety    Panic attacks  . Arthritis   . Asthma   . Chronic low back pain    (Old vertebral fracture)  . Complication of anesthesia   . COPD (chronic obstructive pulmonary disease) (HCC)   . Coronary artery disease 09/08/13   with PCI  . Depression   . Family history of adverse reaction to anesthesia     mother - slow to awaken  . GERD (gastroesophageal reflux disease)   . Heart palpitations    prn toprol for rapid HR  . History of surgery on arm   . HLD (hyperlipidemia)   . HTN (hypertension)   . NSTEMI (non-ST elevated myocardial infarction) (HCC) 09/08/13  . PONV (postoperative nausea and vomiting)   . Renal disorder    ischemic kidney   . Tobacco abuse     Past Surgical History:  Procedure Laterality Date  . 2d echo  12/2012   normal LV with mild LVH, grade 1 diastolic dysfunction  . ABDOMINAL AORTIC ANEURYSM REPAIR  12/02/2015   ABDOMINAL AORTIC ENDOVASCULAR FENESTRATED STENT GRAFT (N/A)  . ABDOMINAL AORTIC DUPLEX  05/19/2012   WHEN COMPARED TO PRIOR STUDY DATED 06/01/11 THERE IS AN INCREASE IN SIZE OF DILATATION.  Marland Kitchen ABDOMINAL AORTIC ENDOVASCULAR FENESTRATED STENT GRAFT N/A 12/02/2015   Procedure: ABDOMINAL AORTIC ENDOVASCULAR FENESTRATED STENT GRAFT;  Surgeon: Nada Libman, MD;  Location[facility] OR;  Service: Vascular;  Laterality: N/A;  . ABDOMINAL AORTOGRAM N/A 07/24/2016   Procedure: Abdominal Aortogram;  Surgeon: Nada Libman, MD;  Location: [facility] INVASIVE CV LAB;  Service: Cardiovascular;  Laterality: N/A;  . ABDOMINAL HYSTERECTOMY    . CHOLECYSTECTOMY    . CORONARY ANGIOPLASTY WITH STENT PLACEMENT  09/10/13   PCI of RCA 90% proximal RCA stenosis with DES  . CORONARY STENT PLACEMENT  09/08/13   PCI with cutting balloon, PTCA,DES to prox LAD  . CYST EXCISION Left    wrist  . DOPPLER ECHOCARDIOGRAPHY  12/31/2012  . LAPAROSCOPIC NISSEN FUNDOPLICATION     for severe reflux  . LEFT HEART CATHETERIZATION WITH CORONARY ANGIOGRAM N/A 09/08/2013   Procedure: LEFT HEART CATHETERIZATION WITH CORONARY ANGIOGRAM;  Surgeon: Lennette Bihari, MD;  Location: [facility] CATH LAB;  Service: Cardiovascular;  Laterality: N/A;  . NUCLEAR STRESS TEST  05/09/2011   NORMAL PATTREN OF PERFUSION IN ALL REGIONS. LV IS NORMAL IS SIZE. EF 68% NO WALL MOTION ABNORMALITIES.  Marland Kitchen PERCUTANEOUS CORONARY STENT INTERVENTION (PCI-S) N/A 09/10/2013   Procedure: PERCUTANEOUS CORONARY STENT INTERVENTION (PCI-S);  Surgeon: Lennette Bihari, MD;  Location: [facility] CATH LAB;  Service: Cardiovascular;  Laterality: N/A;  . PERIPHERAL VASCULAR INTERVENTION Right 07/24/2016   Procedure: Peripheral Vascular Intervention;  Surgeon: Nada Libman, MD;  Location: [facility] INVASIVE CV LAB;  Service: Cardiovascular;  Laterality: Right;   RT RENAL STENT  LT EXT ILIAC STENT  . STENT PLACE LEFT URETER ([facility] HX)       Allergies  Allergies  Allergen Reactions  . Bee Venom Anaphylaxis, Hives and Swelling  . Hydrocodone Other (See Comments)  . Penicillins Other (See Comments)    PATIENT REPORTS "MOTHER TOLD HER SHE HAD ALLERGY TO PCN"  UNKNOWN REACTION TO PCN PCN reaction causing immediate rash, facial/tongue/throat swelling, SOB or lightheadedness  with hypotension: unknown PCN reaction causing severe rash involving mucus membranes or skin necrosis: unknown PCN reaction that required hospitalization unknown PCN reaction occurring within the last 10 years: unknown  . Morphine And Related Rash    History of Present Illness    [name] has a significant cardiac history and sees Dr. Purvis Sheffield in clinic. She had a NSTEMI with DES to LAD (08/2013) and RCA (09/2013). A stress myoview 08/2015 Showed no ischemia or infarction. Last echocardiogram in September 2017 showed normal LV systolic function, grade 1 diastolic dysfunction. She underwent abdominal aortogram 07/24/16 with stent placed to right external iliac artery and left renal artery.    Patient has chronic dyspnea on exertion but no orthopnea, PND or pedal edema. She does not have exertional chest pain or chest pain similar to her infarct pain. She has an occasional cramping sensation in her chest at night that is not exertional and chronic. The patient was walking on stairs to feed her cat recently and fell backwards striking her head. She did not have syncope. She subsequently developed headache. There is a question of whether she may have a brain tumor which may require craniotomy for biopsy and cardiology was asked to evaluate preoperatively.  Inpatient Medications    . ALPRAZolam  0.5 mg Oral TID  . aspirin  81 mg Oral Daily  . atorvastatin  40 mg Oral q1800  . citalopram  20 mg Oral Daily  . iopamidol      . isosorbide mononitrate  60 mg Oral Daily  . lisinopril  20 mg Oral Daily  . metoprolol succinate  50 mg Oral BID  . montelukast  10 mg Oral Daily  . pantoprazole  40 mg Oral q morning - 10a  . sodium chloride flush  3 mL Intravenous Q12H  . ticagrelor  60 mg Oral BID     Outpatient Medications    Prior to Admission medications   Medication Sig Start Date End Date Taking? Authorizing Provider  albuterol (PROVENTIL HFA;VENTOLIN HFA) 108 (90 BASE) MCG/ACT inhaler Inhale  1-2 puffs into the lungs every 4 (four) hours as needed for wheezing or shortness of breath.    Yes [provider]  albuterol (PROVENTIL) (2.5 MG/3ML) 0.083% nebulizer solution Take 2.5 mg by nebulization 2 (two) times daily.    Yes [provider]  ALPRAZolam Prudy Feeler) 0.5 MG tablet Take 0.5 mg by mouth 3 (three) times daily.    Yes [provider]  aspirin 81 MG chewable tablet Chew 81 mg by mouth daily.   Yes [provider]  atorvastatin (LIPITOR) 40 MG tablet take 1 tablet by mouth once daily Patient taking differently: Take 40 mg by mouth daily 09/17/16  Yes Laqueta Linden, MD  BRILINTA 60 MG TABS tablet take 1 tablet by mouth twice a day Patient taking differently: Take 60 mg by mouth twice daily 10/01/16  Yes Laqueta Linden, MD  citalopram (CELEXA)  20 MG tablet Take 20 mg by mouth daily.  06/28/16  Yes [provider]  cyclobenzaprine (FLEXERIL) 10 MG tablet Take 1 tablet (10 mg total) by mouth 3 (three) times daily as needed for muscle spasms.   Yes Lennette Bihari, MD  ferrous gluconate (FERGON) 324 MG tablet Take 324 mg by mouth daily with breakfast.   Yes [provider]  folic acid (FOLVITE) 1 MG tablet take 1 tablet by mouth once daily Patient taking differently: Take 1 mg by mouth once daily 07/20/16  Yes Laqueta Linden, MD  isosorbide mononitrate (IMDUR) 60 MG 24 hr tablet Take 1 tablet (60 mg total) by mouth daily. 05/23/16  Yes Laqueta Linden, MD  lisinopril (PRINIVIL,ZESTRIL) 5 MG tablet take 1 tablet by mouth once daily Patient taking differently: Take 5 mg by mouth once daily 06/07/16  Yes Laqueta Linden, MD  metoprolol succinate (TOPROL-XL) 50 MG 24 hr tablet take 1 tablet by mouth twice a day Patient taking differently: Take 50 mg by mouth twice daily 05/28/16  Yes Laqueta Linden, MD  montelukast (SINGULAIR) 10 MG tablet Take 10 mg by mouth daily.    Yes [provider]  pantoprazole  (PROTONIX) 40 MG tablet take 1 tablet by mouth every morning Patient taking differently: Take 40 mg by mouth in the morning 06/07/16  Yes Laqueta Linden, MD  acetaminophen (TYLENOL) 325 MG tablet Take 650 mg by mouth every 6 (six) hours as needed for moderate pain.     [provider]  cholecalciferol (VITAMIN D) 1000 units tablet Take 1,000 Units by mouth daily.     [provider]  diclofenac sodium (VOLTAREN) 1 % GEL Apply 4 g topically 4 (four) times daily. 10/24/16   Maczis, Elmer Sow, PA-C  Melatonin 10 MG TABS Take 10 mg by mouth at bedtime as needed (for sleep).     [provider]  Menthol, Topical Analgesic, (ICY HOT BACK EX) Apply 1 application topically daily as needed (on back).    [provider]  methocarbamol (ROBAXIN) 500 MG tablet Take 1 tablet (500 mg total) by mouth 2 (two) times daily. Patient not taking: Reported on 11/28/2016 10/24/16   Maczis, Elmer Sow, PA-C  nitroGLYCERIN (NITROSTAT) 0.4 MG SL tablet place 1 tablet under the tongue if needed every 5 minutes fo Patient taking differently: place 0.4 mg under the tongue if needed every 5 minutes for chest pain 08/23/16   Lennette Bihari, MD     Family History    Family History  Problem Relation Age of Onset  . Heart attack Father   . Cancer Father        Esophageal  . Heart disease Father        before age 87  . Hypertension Mother   . Heart attack Son 51  . Heart disease Son        before age 25    Social History    Social History   Social History  . Marital status: Married    Spouse name: N/A  . Number of children: N/A  . Years of education: N/A   Occupational History  . Not on file.   Social History Main Topics  . Smoking status: Former Smoker    Packs/day: 0.50    Years: 37.00    Types: Cigarettes    Quit date: 09/08/2013  . Smokeless tobacco: Never Used  . Alcohol use No  . Drug use: No  .  Sexual activity: Not on file   Other Topics Concern  . Not  on file   Social History Narrative  . No narrative on file     Review of Systems   Patient describes headache General:  No chills, fever, night sweats or weight changes.  Dermatological: No rash, lesions/masses Respiratory: No cough Urologic: No hematuria, dysuria Abdominal:   No nausea, vomiting, diarrhea, bright red blood per rectum, melena, or hematemesis All other systems reviewed and are otherwise negative except as noted above.  Physical Exam    Blood pressure 139/74, pulse 85, temperature 97.8 F (36.6 C), temperature source Oral, resp. rate 18, SpO2 95 %.  General: Pleasant, WD/WN, NAD Psych: Normal affect. Neuro: Alert and oriented X 3. Moves all extremities spontaneously. HEENT: Normal  Neck: Supple without bruits or JVD. Lungs:  Resp regular and unlabored, CTA. Heart: RRR no s3, s4, or murmurs. Abdomen: Soft, non-tender, non-distended, BS + x 4. 1 + femoral pulse on right and 2 + on left Extremities: No clubbing, cyanosis or edema.   Labs     Recent Labs  11/28/16 1045  TROPONINI <0.03   Lab Results  Component Value Date   WBC 7.8 11/29/2016   HGB 11.3 (L) 11/29/2016   HCT 35.6 (L) 11/29/2016   MCV 93.7 11/29/2016   PLT 342 11/29/2016     Recent Labs Lab 11/28/16 1045 11/29/16 0633  NA 136 136  K 3.4* 3.4*  CL 104 104  CO2 27 25  BUN 6 7  CREATININE 0.96 1.06*  CALCIUM 8.8* 8.6*  PROT 7.0  --   BILITOT 0.9  --   ALKPHOS 94  --   ALT 12*  --   AST 17  --   GLUCOSE 122* 110*   Lab Results  Component Value Date   CHOL 152 08/18/2015   HDL 34 (L) 08/18/2015   LDLCALC 76 08/18/2015   TRIG 210 (H) 08/18/2015   Lab Results  Component Value Date   DDIMER 1.21 (H) 08/25/2014     Radiology Studies    Ct Abdomen Pelvis Wo Contrast  Result Date: 11/01/2016 CLINICAL DATA:  66 year old female with right flank pain, burning sensation for 2 weeks. EXAM: CT ABDOMEN AND PELVIS WITHOUT CONTRAST TECHNIQUE: Multidetector CT imaging of the abdomen  and pelvis was performed following the standard protocol without IV contrast. COMPARISON:  CTA abdomen and pelvis 07/09/2016 and earlier. FINDINGS: Lower chest: Chronic medial right lower lobe atelectasis or scarring is stable. No acute lung base opacity, pericardial or pleural effusion. Hepatobiliary: Surgically absent gallbladder. Negative noncontrast liver. Pancreas: Negative. Spleen: Negative. Adrenals/Urinary Tract: Normal adrenal glands. Chronic right renal atrophy. The right kidney and right ureter appears stable since January. No right side urologic calculus. More normal appearance of the noncontrast left kidney. No acute left perinephric stranding. Negative course of the left ureter. Urinary bladder remarkable for mild cystocele which appears related to pelvic floor laxity (sagittal image 60). Stomach/Bowel: Decompressed distal colon and left colon. Gas and stool in the transverse colon which is not dilated. Moderate retained stool throughout the right colon which is redundant. The cecum is located in the pelvis. Diminutive or absent appendix. Oral contrast has reached the distal small bowel but not yet the terminal ileum. Flocculated material in the terminal ileum. No dilated or inflamed small bowel. Moderate distension of the stomach with oral contrast. Negative duodenum. No abdominal free fluid. Vascular/Lymphatic: Vascular patency is not evaluated in the absence of IV contrast. 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 Native abdominal aortic aneurysm sac size appears stable to decreased since January. Reproductive: Surgically absent uterus.  Diminutive adnexa. Other: No pelvic free fluid. Musculoskeletal: Osteopenia. Widespread mild endplate compression in the lower thoracic and lumbar spine. Stable visualized osseous structures. IMPRESSION: 1. No acute or inflammatory process identified in the abdomen or pelvis. 2. Chronic right renal atrophy. No urologic calculus  or obstructive uropathy. 3. Multilevel lower thoracic and lumbar spine vertebral body compression appears stable since January. 4. Chronic abdominal aortic endograft. Electronically Signed   By: Odessa Fleming M.D.   On: 11/01/2016 20:46   Ct Angio Head W Or Wo Contrast  Result Date: 11/29/2016 CLINICAL DATA:  Blurred vision of the left eye. Punctate infarct of the anterior left frontal lobe. EXAM: CT ANGIOGRAPHY HEAD AND NECK TECHNIQUE: Multidetector CT imaging of the head and neck was performed using the standard protocol during bolus administration of intravenous contrast. Multiplanar CT image reconstructions and MIPs were obtained to evaluate the vascular anatomy. Carotid stenosis measurements (when applicable) are obtained utilizing NASCET criteria, using the distal internal carotid diameter as the denominator. CONTRAST:  50 mL Isovue 370 COMPARISON:  MRI brain from the same day. FINDINGS: CT HEAD FINDINGS Brain: Asymmetric subcortical white matter hypoattenuation is again noted in the left occipital lobe. This extends in a posterior left temporal lobe, unchanged from the MRI. Extensive periventricular and subcortical white matter changes are advanced for age. Vascular: Atherosclerotic calcifications are present in the cavernous internal carotid artery's bilaterally. There is no hyperdense vessel. Skull: No focal lytic or blastic lesions are present. The calvarium is intact. Sinuses: The paranasal sinuses are clear. Fluid is again noted in the mastoid air cells, left greater than right. No obstructing nasopharyngeal lesion is present. Orbits: The globes and orbits are within normal limits bilaterally. Review of the MIP images confirms the above findings CTA NECK FINDINGS Aortic arch: There is a common origin of the innominate and left common carotid artery's. Atherosclerotic plaque is noted on the right lateral aspect of the common origin without a significant stenosis. Additional calcifications are present  without aneurysm or stenosis. Right carotid system: The right common carotid artery is within normal limits. And carotid bifurcation is unremarkable. There is some atherosclerotic change the proximal left right internal carotid artery without a significant stenosis relative to the more distal vessel. Left carotid system: The left common carotid artery is within normal limits. Minimal atherosclerotic changes are present at bifurcation without significant stenosis. Mild tortuosity is present in the cervical ICA without a significant stenosis of the more distal vessel. Vertebral arteries: The vertebral arteries both originate from the subclavian arteries. The vertebral arteries are codominant. There is no significant stenosis of either vertebral artery in the neck. Skeleton: Endplate degenerative changes are most pronounced at C5-6 and C6-7. There is slight degenerative anterolisthesis at C4-5. Facet disease is noted at C2-3, C3-4, and C4-5. No focal lytic or blastic lesions are present. Other neck: The thyroid is within normal limits. No focal mucosal disease is present. The salivary glands are normal. There is no significant adenopathy. Upper chest: Centrilobular emphysema is noted. There is mild dependent atelectasis bilaterally. No focal nodule, mass, or airspace disease is present. Coronary artery calcifications are present. Review of the MIP images confirms the above findings CTA HEAD FINDINGS Anterior circulation: Atherosclerotic calcifications are present within the left cavernous internal carotid artery without a significant stenosis. The internal carotid artery's are otherwise within normal limits to the ICA  termini bilaterally. The A1 and M1 segments are normal. The anterior communicating artery is patent. The MCA bifurcations are intact bilaterally. ACA and MCA branch vessels are unremarkable. Posterior circulation: The vertebral arteries are codominant. The PICA origins are visualized and normal. Both  posterior cerebral arteries originate from the basilar tip. The PCA branch vessels are within normal limits. Venous sinuses: The dural sinuses are patent. The straight sinus and deep cerebral veins are intact. Cortical veins are unremarkable. Anatomic variants: None Delayed phase: The postcontrast images exaggerate the white matter changes in the left occipital and temporal lobe. No focal enhancement is present. Review of the MIP images confirms the above findings IMPRESSION: 1. Mild atherosclerotic changes at the carotid bifurcations and proximal internal carotid artery's bilaterally without significant stenosis. 2. Mild atherosclerotic changes at the aortic arch as described. 3. No significant proximal stenosis, aneurysm, or branch vessel occlusion within the circle of Willis. 4. Asymmetric white matter hypoattenuation involving the left occipital lobe as described. The differential diagnosis includes asymmetric posterior reversible encephalopathy syndrome. Recent ischemia is also considered. Neoplasm is considered less likely. Given the extensive white matter disease elsewhere, this likely is vasogenic in origin. Follow-up MRI as previously recommended would be helpful. Electronically Signed   By: Marin Roberts M.D.   On: 11/29/2016 14:44   Dg Chest 2 View  Result Date: 11/28/2016 CLINICAL DATA:  Syncope EXAM: CHEST  2 VIEW COMPARISON:  May 10, 2016 FINDINGS: There is no edema or consolidation. Heart is upper normal in size with pulmonary vascularity within normal limits. No adenopathy. There is an abdominal aortic stent graft. There is aortic atherosclerosis. No evident bone lesions. IMPRESSION: No edema or consolidation. There is aortic atherosclerosis. Abdominal aortic stent graft present. Electronically Signed   By: Bretta Bang III M.D.   On: 11/28/2016 13:55   Ct Angio Neck W Or Wo Contrast  Result Date: 11/29/2016 CLINICAL DATA:  Blurred vision of the left eye. Punctate infarct of  the anterior left frontal lobe. EXAM: CT ANGIOGRAPHY HEAD AND NECK TECHNIQUE: Multidetector CT imaging of the head and neck was performed using the standard protocol during bolus administration of intravenous contrast. Multiplanar CT image reconstructions and MIPs were obtained to evaluate the vascular anatomy. Carotid stenosis measurements (when applicable) are obtained utilizing NASCET criteria, using the distal internal carotid diameter as the denominator. CONTRAST:  50 mL Isovue 370 COMPARISON:  MRI brain from the same day. FINDINGS: CT HEAD FINDINGS Brain: Asymmetric subcortical white matter hypoattenuation is again noted in the left occipital lobe. This extends in a posterior left temporal lobe, unchanged from the MRI. Extensive periventricular and subcortical white matter changes are advanced for age. Vascular: Atherosclerotic calcifications are present in the cavernous internal carotid artery's bilaterally. There is no hyperdense vessel. Skull: No focal lytic or blastic lesions are present. The calvarium is intact. Sinuses: The paranasal sinuses are clear. Fluid is again noted in the mastoid air cells, left greater than right. No obstructing nasopharyngeal lesion is present. Orbits: The globes and orbits are within normal limits bilaterally. Review of the MIP images confirms the above findings CTA NECK FINDINGS Aortic arch: There is a common origin of the innominate and left common carotid artery's. Atherosclerotic plaque is noted on the right lateral aspect of the common origin without a significant stenosis. Additional calcifications are present without aneurysm or stenosis. Right carotid system: The right common carotid artery is within normal limits. And carotid bifurcation is unremarkable. There is some atherosclerotic change the proximal left right  internal carotid artery without a significant stenosis relative to the more distal vessel. Left carotid system: The left common carotid artery is within  normal limits. Minimal atherosclerotic changes are present at bifurcation without significant stenosis. Mild tortuosity is present in the cervical ICA without a significant stenosis of the more distal vessel. Vertebral arteries: The vertebral arteries both originate from the subclavian arteries. The vertebral arteries are codominant. There is no significant stenosis of either vertebral artery in the neck. Skeleton: Endplate degenerative changes are most pronounced at C5-6 and C6-7. There is slight degenerative anterolisthesis at C4-5. Facet disease is noted at C2-3, C3-4, and C4-5. No focal lytic or blastic lesions are present. Other neck: The thyroid is within normal limits. No focal mucosal disease is present. The salivary glands are normal. There is no significant adenopathy. Upper chest: Centrilobular emphysema is noted. There is mild dependent atelectasis bilaterally. No focal nodule, mass, or airspace disease is present. Coronary artery calcifications are present. Review of the MIP images confirms the above findings CTA HEAD FINDINGS Anterior circulation: Atherosclerotic calcifications are present within the left cavernous internal carotid artery without a significant stenosis. The internal carotid artery's are otherwise within normal limits to the ICA termini bilaterally. The A1 and M1 segments are normal. The anterior communicating artery is patent. The MCA bifurcations are intact bilaterally. ACA and MCA branch vessels are unremarkable. Posterior circulation: The vertebral arteries are codominant. The PICA origins are visualized and normal. Both posterior cerebral arteries originate from the basilar tip. The PCA branch vessels are within normal limits. Venous sinuses: The dural sinuses are patent. The straight sinus and deep cerebral veins are intact. Cortical veins are unremarkable. Anatomic variants: None Delayed phase: The postcontrast images exaggerate the white matter changes in the left occipital and  temporal lobe. No focal enhancement is present. Review of the MIP images confirms the above findings IMPRESSION: 1. Mild atherosclerotic changes at the carotid bifurcations and proximal internal carotid artery's bilaterally without significant stenosis. 2. Mild atherosclerotic changes at the aortic arch as described. 3. No significant proximal stenosis, aneurysm, or branch vessel occlusion within the circle of Willis. 4. Asymmetric white matter hypoattenuation involving the left occipital lobe as described. The differential diagnosis includes asymmetric posterior reversible encephalopathy syndrome. Recent ischemia is also considered. Neoplasm is considered less likely. Given the extensive white matter disease elsewhere, this likely is vasogenic in origin. Follow-up MRI as previously recommended would be helpful. Electronically Signed   By: Marin Roberts M.D.   On: 11/29/2016 14:44   Mr Maxine Glenn Head Wo Contrast  Result Date: 11/28/2016 CLINICAL DATA:  Syncope.  Potential stroke. EXAM: MRI HEAD WITHOUT AND WITH CONTRAST MRA HEAD WITHOUT CONTRAST TECHNIQUE: Multiplanar, multiecho pulse sequences of the brain and surrounding structures were obtained without and with intravenous contrast. Angiographic images of the head were obtained using MRA technique without contrast. CONTRAST:  15 mL MULTIHANCE GADOBENATE DIMEGLUMINE 529 MG/ML IV SOLN COMPARISON:  Head CT 11/28/2016 FINDINGS: MRI HEAD FINDINGS Brain: There is a partially empty sella. There is a punctate focus of diffusion restriction within the parasagittal left frontal lobe, along the base of the left medial frontal gyrus. No acute hemorrhage. No mass effect. There is multifocal hyperintense T2-weighted signal within the periventricular white matter, most often seen in the setting of chronic microvascular ischemia. There is a volume positive area of hyperintense T2 weighted signal in the left occipital lobe that spares the cortex in a vasogenic edema pattern.  There is no associated contrast enhancement. There are  multiple old lacunar infarcts of the right basal ganglia and corona radiata. No chronic microhemorrhage or cerebral amyloid angiopathy. No hydrocephalus, age advanced atrophy or lobar predominant volume loss. No dural abnormality or extra-axial collection. Skull and upper cervical spine: The visualized skull base, calvarium, upper cervical spine and extracranial soft tissues are normal. Sinuses/Orbits: No fluid levels or advanced mucosal thickening. No mastoid effusion. Normal orbits. MRA HEAD FINDINGS Intracranial internal carotid arteries: Normal. Anterior cerebral arteries: Normal. Middle cerebral arteries: Normal. Posterior communicating arteries: Absent bilaterally. Posterior cerebral arteries: Normal. Basilar artery: Normal. Vertebral arteries: Codominant. Normal. Superior cerebellar arteries: Normal. Anterior inferior cerebellar arteries: Normal. Posterior inferior cerebellar arteries: Normal. IMPRESSION: 1. Punctate focus of acute ischemia within the left medial frontal gyrus without acute hemorrhage or mass effect. 2. Vasogenic edema within the left occipital lobe without associated contrast enhancement. This may indicate a neoplasm such as a low grade glioma. If there are remote prior imaging studies of the brain, comparison would be helpful. A second possibility is a relatively recent, early chronic infarct. Follow-up imaging in approximately 12 weeks is recommended, as the presence or absence of associated volume loss would aid in differentiating the two. 3. 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 4. Normal intracranial MRA. Electronically Signed   By: Deatra Robinson M.D.   On: 11/28/2016 13:50   Mr Laqueta Jean ON Contrast  Result Date: 11/28/2016 CLINICAL DATA:  Syncope.  Potential stroke. EXAM: MRI HEAD WITHOUT AND WITH CONTRAST MRA HEAD WITHOUT CONTRAST TECHNIQUE: Multiplanar, multiecho pulse sequences  of the brain and surrounding structures were obtained without and with intravenous contrast. Angiographic images of the head were obtained using MRA technique without contrast. CONTRAST:  15 mL MULTIHANCE GADOBENATE DIMEGLUMINE 529 MG/ML IV SOLN COMPARISON:  Head CT 11/28/2016 FINDINGS: MRI HEAD FINDINGS Brain: There is a partially empty sella. There is a punctate focus of diffusion restriction within the parasagittal left frontal lobe, along the base of the left medial frontal gyrus. No acute hemorrhage. No mass effect. There is multifocal hyperintense T2-weighted signal within the periventricular white matter, most often seen in the setting of chronic microvascular ischemia. There is a volume positive area of hyperintense T2 weighted signal in the left occipital lobe that spares the cortex in a vasogenic edema pattern. There is no associated contrast enhancement. There are multiple old lacunar infarcts of the right basal ganglia and corona radiata. No chronic microhemorrhage or cerebral amyloid angiopathy. No hydrocephalus, age advanced atrophy or lobar predominant volume loss. No dural abnormality or extra-axial collection. Skull and upper cervical spine: The visualized skull base, calvarium, upper cervical spine and extracranial soft tissues are normal. Sinuses/Orbits: No fluid levels or advanced mucosal thickening. No mastoid effusion. Normal orbits. MRA HEAD FINDINGS Intracranial internal carotid arteries: Normal. Anterior cerebral arteries: Normal. Middle cerebral arteries: Normal. Posterior communicating arteries: Absent bilaterally. Posterior cerebral arteries: Normal. Basilar artery: Normal. Vertebral arteries: Codominant. Normal. Superior cerebellar arteries: Normal. Anterior inferior cerebellar arteries: Normal. Posterior inferior cerebellar arteries: Normal. IMPRESSION: 1. Punctate focus of acute ischemia within the left medial frontal gyrus without acute hemorrhage or mass effect. 2. Vasogenic edema  within the left occipital lobe without associated contrast enhancement. This may indicate a neoplasm such as a low grade glioma. If there are remote prior imaging studies of the brain, comparison would be helpful. A second possibility is a relatively recent, early chronic infarct. Follow-up imaging in approximately 12 weeks is recommended, as the presence or absence of associated volume loss would  aid in differentiating the two. 3. 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 4. Normal intracranial MRA. Electronically Signed   By: Deatra Robinson M.D.   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  This was initially treated with balloon angioplasty with a non-flow-limiting dissection on the inferior aspect which was subsequently treated with a 6 x 12 Herculink balloon expandable stent.  This was postdilated with a 7 mm balloon #3  occluded right renal artery.  Echocardiogram: 03/03/16: Study Conclusions - Left ventricle: The cavity size was normal. Systolic function was   normal. The estimated ejection fraction was in the range of 55%   to 60%. Wall motion was normal; there were no regional wall   motion abnormalities. There was an increased relative   contribution of atrial contraction to ventricular filling.   Doppler parameters are consistent with abnormal left ventricular   relaxation (grade 1 diastolic dysfunction). - Aortic valve: Trileaflet; normal thickness, mildly calcified   leaflets. - Mitral valve: There was trivial regurgitation. - Pulmonic valve: There was mild regurgitation.  08/16/15 stress myoview  There was no ST segment deviation noted during stress.  The study is normal. There are no perfusion defects  consistent with prior infarction or current ischemia  This is a low risk study.  The left ventricular ejection fraction is normal (55-65%).   01/20/14 stress myoview Low risk stress nuclear study with a possible tiny apicolateral scar. No reversible ischemia is seen.  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  Successful PCI to the proximal RCA with insertion of a 3.0x15 mm Xience Alpine DES stent postdilated to 3.26 mm with the stenoses being reduced to 0%.  09/08/13 LHC Acute coronary syndrome secondary to subtotal proximal LAD stenosis with associated moderate mid distal anterolateral hypocontractility and initial ejection fraction of approximately 45-50%  Significant 2 vessel coronary obstructive disease with 50% very proximal LAD stenosis followed by 95% eccentric proximal LAD stenosis and 40-50% focal mid LAD stenosis; and significant 85-90% "apple core" stenosis of the proximal RCA followed by 50-60% narrowing with 30-40% mid RCA stenosis.  Moderate size infrarenal abdominal aortic aneurysm  Successful percutaneous coronary intervention with angioscope cutting balloon, PTCA, and DES stenting with a Xience Alpine 3.0x23 mm stent postdilated to 3.25 mm.   Assessment & Plan    1 Preoperative evaluation prior to possible craniotomy or biopsy-patient has done well from a symptomatic standpoint. She has an occasional cramping sensation in her chest at night but no symptoms similar to her infarct pain. She has dyspnea on exertion which is also chronic but she can ambulate at least 1 block without having chest pain. Nuclear study February 2017 showed no infarct or ischemia and her LV function is  preserved. She may proceed with surgery without further cardiac evaluation. She is on aspirin and brilinta but her last intervention was to her renal artery and external iliac in February. It has been 4 months. Therefore if necessary her  brilinta can be held 5-7 days prior to any procedure. If she is not scheduled for the above procedure would not interrupt antiplatelet therapy. Check electrocardiogram.   2 question syncope-patient is clear that she stumbled while descending her stairs and then struck her head. She did not have syncope.  3 coronary artery disease- Continue ASA and statin.  4 PVD-continue asa, brilinta and statin; if surgery necessary, hold brilinta 5-7 days prior to procedure.  5 Hyperlipidemia-continue statin.   6 hypertension-continue preadmission blood pressure medications and increase as needed.   Patient should follow-up with [facility] following DC. Please call if further questions arise.   Signed, Olga Millers MD 11/29/2016, 2:59 PM

## 2016-11-29 NOTE — Progress Notes (Signed)
[facility] PROGRESS NOTE  Olivia Wade WUJ:[id number] DOB: [birth year] DOA: 11/28/2016   PCP: Louie Boston., MD     Assessment/Plan: Active Problems:   HTN (hypertension)   Hyperlipidemia   GERD (gastroesophageal reflux disease)   Asthma with COPD   CAD (coronary artery disease), recent DES stents to RCA and LAD in 3/15 and 4/15   Brain mass   66 y.o. female with medical history significant of depression/anxiety, asthma, COPD, Cory artery disease status post cardiac cardiac catheterization, GERD, hypertension, hyperlipidemia, who presented with syncope, headache for 3 days, visual field deficits of the left eye, generalized weakness. CT head showed vasogenic edema in the left occipital lobe. Neurology and neurosurgery consulted.  Assessment and plan  Hypodensity suspicious for possible mass was seen within her left occipital lobe  MRI  acute ischemia within the left medial frontal gyrus without acute hemorrhage or mass effect. Patient evaluated by neurosurgery Dr. Newell Coral. Unclear etiology. Given extensive vasculopathic history, most likely causes cerebrovascular disease vs low-grade glioma. May need follow-up with Dr. Celene Kras, a neuro oncologist , as well as neurosurgical intervention or craniotomy for biopsy next month Symptoms are 1 wk of dysarthria, mild LUE dysmetria, purple spots in L visual field, syncope x1, constant HA. Stroke is also a consideration though less likely. - BP control as below - Neuro following and appreciate their input.  2-D echo as part of CVA workup   Syncope: Likely due to mass/abnormality as above. No reported szr. No significant metabolic derangements as noted per my review of records from [facility]. Doubt this is due to medication side effect. - Orthostatic vital signs, slightly orthostatic on admission with a 22 mm hg drop.systolic blood pressure - Telemetry-sinus rhythm     HTN: Increase lisinopril to 20 mg a day. Continue  lisinopril, metoprolol, Imdur - IV hydralazine when necessary  Anxiety/depression:  - Continue Xanax, Celexa  COPD: Due to years of tobacco abuse. Quit several years ago. No evidence of acute Station. Noted to requirement. - Continue Singulair - Albuterol when necessary  GERD: - Continue Protonix  Chronic pain: - continue flexeril  CAD/MI: EKG without signs of ACS. Peterson syncopal episode keep on telemetry and obtain troponin 1. 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 Denies any nausea vomiting  Objective: Vitals:   11/29/16 0100 11/29/16 0219 11/29/16 0242 11/29/16 0447  BP: (!) 161/82 (!) 162/86 (!) 157/70 (!) 155/79  Pulse: 89 85  95  Resp: 18   20  Temp: 98 F (36.7 C)   98.6 F (37 C)  TempSrc: Oral   Oral  SpO2: 93% 95%  95%    Intake/Output Summary (Last 24 hours) at 11/29/16 9562 Last data filed at 11/29/16 0300  Gross  per 24 hour  Intake             1760 ml  Output                0 ml  Net             1760 ml    Exam:  Examination:  General exam: Appears calm and comfortable  Respiratory system: Clear to auscultation. Respiratory effort normal. Cardiovascular system: S1 & S2 heard, RRR. No JVD,  murmurs, rubs, gallops or clicks. No pedal edema. Gastrointestinal system: Abdomen is nondistended, soft and nontender. No organomegaly or masses felt. Normal bowel sounds heard. Central nervous system: Alert and oriented. No focal neurological deficits. Extremities: Symmetric 5 x 5 power. Skin: No rashes, lesions or ulcers Psychiatry: Judgement and insight appear normal. Mood & affect appropriate.     Data Reviewed: I have personally reviewed following labs and imaging studies  Micro Results No results found for this or any previous visit (from the past 240 hour(s)).  Radiology Reports Ct Abdomen Pelvis Wo Contrast  Result Date: 11/01/2016 CLINICAL DATA:  66 year old female with right flank pain, burning sensation for 2 weeks. EXAM: CT ABDOMEN AND PELVIS WITHOUT CONTRAST TECHNIQUE: Multidetector CT imaging of the abdomen and pelvis was performed following the standard protocol without IV contrast. COMPARISON:  CTA abdomen and pelvis 07/09/2016 and earlier. FINDINGS: Lower chest: Chronic medial right lower lobe atelectasis or scarring is stable. No acute lung base opacity, pericardial or pleural effusion. Hepatobiliary: Surgically absent gallbladder. Negative noncontrast liver. Pancreas: Negative. Spleen: Negative. Adrenals/Urinary Tract: Normal adrenal glands. Chronic right renal atrophy. The right kidney and right ureter appears stable since January. No right side urologic calculus. More normal appearance of the noncontrast left kidney. No acute left perinephric stranding. Negative course of the left ureter. Urinary bladder remarkable for mild cystocele which appears related to pelvic floor laxity (sagittal image 60). Stomach/Bowel: Decompressed distal colon and left colon. Gas and stool in the transverse colon which is not dilated. Moderate retained stool throughout the right colon which is redundant. The cecum is located in the pelvis. Diminutive or absent appendix. Oral contrast has reached the  distal small bowel but not yet the terminal ileum. Flocculated material in the terminal ileum. No dilated or inflamed small bowel. Moderate distension of the stomach with oral contrast. Negative duodenum. No abdominal free fluid. Vascular/Lymphatic: Vascular patency is not evaluated in the absence of IV contrast. 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 Reproductive: Surgically absent uterus.  Diminutive adnexa. Other: No pelvic free fluid. Musculoskeletal: Osteopenia. Widespread mild endplate compression in the lower thoracic and lumbar spine. Stable visualized osseous structures. IMPRESSION: 1. No acute or inflammatory process identified in the abdomen or pelvis. 2. Chronic right renal atrophy. No urologic calculus or obstructive uropathy. 3. Multilevel lower thoracic and lumbar spine vertebral body compression appears stable since January. 4. Chronic abdominal aortic endograft. Electronically Signed   By: Odessa Fleming M.D.   On: 11/01/2016 20:46   Dg Chest 2 View  Result Date: 11/28/2016 CLINICAL DATA:  Syncope EXAM: CHEST  2 VIEW COMPARISON:  May 10, 2016 FINDINGS: There is no edema or consolidation. Heart is upper normal in size with pulmonary vascularity within normal limits. No adenopathy. There is an abdominal aortic stent graft. There is aortic atherosclerosis. No evident bone lesions. IMPRESSION: No edema or consolidation. There is aortic atherosclerosis. Abdominal aortic stent  graft present. Electronically Signed   By: Bretta Bang III M.D.   On: 11/28/2016 13:55   Mr Maxine Glenn Head Wo Contrast  Result Date: 11/28/2016 CLINICAL DATA:  Syncope.  Potential stroke. EXAM: MRI HEAD WITHOUT AND WITH CONTRAST MRA HEAD WITHOUT CONTRAST TECHNIQUE: Multiplanar, multiecho pulse sequences of the brain and surrounding structures were obtained without and with intravenous contrast.  Angiographic images of the head were obtained using MRA technique without contrast. CONTRAST:  15 mL MULTIHANCE GADOBENATE DIMEGLUMINE 529 MG/ML IV SOLN COMPARISON:  Head CT 11/28/2016 FINDINGS: MRI HEAD FINDINGS Brain: There is a partially empty sella. There is a punctate focus of diffusion restriction within the parasagittal left frontal lobe, along the base of the left medial frontal gyrus. No acute hemorrhage. No mass effect. There is multifocal hyperintense T2-weighted signal within the periventricular white matter, most often seen in the setting of chronic microvascular ischemia. There is a volume positive area of hyperintense T2 weighted signal in the left occipital lobe that spares the cortex in a vasogenic edema pattern. There is no associated contrast enhancement. There are multiple old lacunar infarcts of the right basal ganglia and corona radiata. No chronic microhemorrhage or cerebral amyloid angiopathy. No hydrocephalus, age advanced atrophy or lobar predominant volume loss. No dural abnormality or extra-axial collection. Skull and upper cervical spine: The visualized skull base, calvarium, upper cervical spine and extracranial soft tissues are normal. Sinuses/Orbits: No fluid levels or advanced mucosal thickening. No mastoid effusion. Normal orbits. MRA HEAD FINDINGS Intracranial internal carotid arteries: Normal. Anterior cerebral arteries: Normal. Middle cerebral arteries: Normal. Posterior communicating arteries: Absent bilaterally. Posterior cerebral arteries: Normal. Basilar artery: Normal. Vertebral arteries: Codominant. Normal. Superior cerebellar arteries: Normal. Anterior inferior cerebellar arteries: Normal. Posterior inferior cerebellar arteries: Normal. IMPRESSION: 1. Punctate focus of acute ischemia within the left medial frontal gyrus without acute hemorrhage or mass effect. 2. Vasogenic edema within the left occipital lobe without associated contrast enhancement. This may indicate a  neoplasm such as a low grade glioma. If there are remote prior imaging studies of the brain, comparison would be helpful. A second possibility is a relatively recent, early chronic infarct. Follow-up imaging in approximately 12 weeks is recommended, as the presence or absence of associated volume loss would aid in differentiating the two. 3. 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 4. Normal intracranial MRA. Electronically Signed   By: Deatra Robinson M.D.   On: 11/28/2016 13:50   Mr Laqueta Jean WR Contrast  Result Date: 11/28/2016 CLINICAL DATA:  Syncope.  Potential stroke. EXAM: MRI HEAD WITHOUT AND WITH CONTRAST MRA HEAD WITHOUT CONTRAST TECHNIQUE: Multiplanar, multiecho pulse sequences of the brain and surrounding structures were obtained without and with intravenous contrast. Angiographic images of the head were obtained using MRA technique without contrast. CONTRAST:  15 mL MULTIHANCE GADOBENATE DIMEGLUMINE 529 MG/ML IV SOLN COMPARISON:  Head CT 11/28/2016 FINDINGS: MRI HEAD FINDINGS Brain: There is a partially empty sella. There is a punctate focus of diffusion restriction within the parasagittal left frontal lobe, along the base of the left medial frontal gyrus. No acute hemorrhage. No mass effect. There is multifocal hyperintense T2-weighted signal within the periventricular white matter, most often seen in the setting of chronic microvascular ischemia. There is a volume positive area of hyperintense T2 weighted signal in the left occipital lobe that spares the cortex in a vasogenic edema pattern. There is no associated contrast enhancement. There are multiple old lacunar infarcts of the right basal ganglia and corona  radiata. No chronic microhemorrhage or cerebral amyloid angiopathy. No hydrocephalus, age advanced atrophy or lobar predominant volume loss. No dural abnormality or extra-axial collection. Skull and upper cervical spine: The visualized skull base,  calvarium, upper cervical spine and extracranial soft tissues are normal. Sinuses/Orbits: No fluid levels or advanced mucosal thickening. No mastoid effusion. Normal orbits. MRA HEAD FINDINGS Intracranial internal carotid arteries: Normal. Anterior cerebral arteries: Normal. Middle cerebral arteries: Normal. Posterior communicating arteries: Absent bilaterally. Posterior cerebral arteries: Normal. Basilar artery: Normal. Vertebral arteries: Codominant. Normal. Superior cerebellar arteries: Normal. Anterior inferior cerebellar arteries: Normal. Posterior inferior cerebellar arteries: Normal. IMPRESSION: 1. Punctate focus of acute ischemia within the left medial frontal gyrus without acute hemorrhage or mass effect. 2. Vasogenic edema within the left occipital lobe without associated contrast enhancement. This may indicate a neoplasm such as a low grade glioma. If there are remote prior imaging studies of the brain, comparison would be helpful. A second possibility is a relatively recent, early chronic infarct. Follow-up imaging in approximately 12 weeks is recommended, as the presence or absence of associated volume loss would aid in differentiating the two. 3. 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 4. Normal intracranial MRA. Electronically Signed   By: Deatra Robinson M.D.   On: 11/28/2016 13:50     CBC  Recent Labs Lab 11/28/16 1045 11/29/16 0633  WBC 7.2 7.8  HGB 11.0* 11.3*  HCT 34.7* 35.6*  PLT 335 342  MCV 92.0 93.7  MCH 29.2 29.7  MCHC 31.7 31.7  RDW 14.9 15.3    Chemistries   Recent Labs Lab 11/28/16 1045 11/29/16 0633  NA 136 136  K 3.4* 3.4*  CL 104 104  CO2 27 25  GLUCOSE 122* 110*  BUN 6 7  CREATININE 0.96 1.06*  CALCIUM 8.8* 8.6*  AST 17  --   ALT 12*  --   ALKPHOS 94  --   BILITOT 0.9  --    ------------------------------------------------------------------------------------------------------------------ CrCl cannot be  calculated (Unknown ideal weight.). ------------------------------------------------------------------------------------------------------------------ No results for input(s): HGBA1C in the last 72 hours. ------------------------------------------------------------------------------------------------------------------ No results for input(s): CHOL, HDL, LDLCALC, TRIG, CHOLHDL, LDLDIRECT in the last 72 hours. ------------------------------------------------------------------------------------------------------------------ No results for input(s): TSH, T4TOTAL, T3FREE, THYROIDAB in the last 72 hours.  Invalid input(s): FREET3 ------------------------------------------------------------------------------------------------------------------ No results for input(s): VITAMINB12, FOLATE, FERRITIN, TIBC, IRON, RETICCTPCT in the last 72 hours.  Coagulation profile  Recent Labs Lab 11/28/16 1045  INR 1.05    No results for input(s): DDIMER in the last 72 hours.  Cardiac Enzymes  Recent Labs Lab 11/28/16 1045  TROPONINI <0.03   ------------------------------------------------------------------------------------------------------------------ Invalid input(s): POCBNP   CBG: No results for input(s): GLUCAP in the last 168 hours.     Studies: Dg Chest 2 View  Result Date: 11/28/2016 CLINICAL DATA:  Syncope EXAM: CHEST  2 VIEW COMPARISON:  May 10, 2016 FINDINGS: There is no edema or consolidation. Heart is upper normal in size with pulmonary vascularity within normal limits. No adenopathy. There is an abdominal aortic stent graft. There is aortic atherosclerosis. No evident bone lesions. IMPRESSION: No edema or consolidation. There is aortic atherosclerosis. Abdominal aortic stent graft present. Electronically Signed   By: Bretta Bang III M.D.   On: 11/28/2016 13:55   Mr Maxine Glenn Head Wo Contrast  Result Date: 11/28/2016 CLINICAL DATA:  Syncope.  Potential stroke. EXAM: MRI HEAD  WITHOUT AND WITH CONTRAST MRA HEAD WITHOUT CONTRAST TECHNIQUE: Multiplanar, multiecho pulse sequences of the brain and surrounding structures were obtained without  and with intravenous contrast. Angiographic images of the head were obtained using MRA technique without contrast. CONTRAST:  15 mL MULTIHANCE GADOBENATE DIMEGLUMINE 529 MG/ML IV SOLN COMPARISON:  Head CT 11/28/2016 FINDINGS: MRI HEAD FINDINGS Brain: There is a partially empty sella. There is a punctate focus of diffusion restriction within the parasagittal left frontal lobe, along the base of the left medial frontal gyrus. No acute hemorrhage. No mass effect. There is multifocal hyperintense T2-weighted signal within the periventricular white matter, most often seen in the setting of chronic microvascular ischemia. There is a volume positive area of hyperintense T2 weighted signal in the left occipital lobe that spares the cortex in a vasogenic edema pattern. There is no associated contrast enhancement. There are multiple old lacunar infarcts of the right basal ganglia and corona radiata. No chronic microhemorrhage or cerebral amyloid angiopathy. No hydrocephalus, age advanced atrophy or lobar predominant volume loss. No dural abnormality or extra-axial collection. Skull and upper cervical spine: The visualized skull base, calvarium, upper cervical spine and extracranial soft tissues are normal. Sinuses/Orbits: No fluid levels or advanced mucosal thickening. No mastoid effusion. Normal orbits. MRA HEAD FINDINGS Intracranial internal carotid arteries: Normal. Anterior cerebral arteries: Normal. Middle cerebral arteries: Normal. Posterior communicating arteries: Absent bilaterally. Posterior cerebral arteries: Normal. Basilar artery: Normal. Vertebral arteries: Codominant. Normal. Superior cerebellar arteries: Normal. Anterior inferior cerebellar arteries: Normal. Posterior inferior cerebellar arteries: Normal. IMPRESSION: 1. Punctate focus of acute  ischemia within the left medial frontal gyrus without acute hemorrhage or mass effect. 2. Vasogenic edema within the left occipital lobe without associated contrast enhancement. This may indicate a neoplasm such as a low grade glioma. If there are remote prior imaging studies of the brain, comparison would be helpful. A second possibility is a relatively recent, early chronic infarct. Follow-up imaging in approximately 12 weeks is recommended, as the presence or absence of associated volume loss would aid in differentiating the two. 3. 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 4. Normal intracranial MRA. Electronically Signed   By: Deatra Robinson M.D.   On: 11/28/2016 13:50   Mr Laqueta Jean WU Contrast  Result Date: 11/28/2016 CLINICAL DATA:  Syncope.  Potential stroke. EXAM: MRI HEAD WITHOUT AND WITH CONTRAST MRA HEAD WITHOUT CONTRAST TECHNIQUE: Multiplanar, multiecho pulse sequences of the brain and surrounding structures were obtained without and with intravenous contrast. Angiographic images of the head were obtained using MRA technique without contrast. CONTRAST:  15 mL MULTIHANCE GADOBENATE DIMEGLUMINE 529 MG/ML IV SOLN COMPARISON:  Head CT 11/28/2016 FINDINGS: MRI HEAD FINDINGS Brain: There is a partially empty sella. There is a punctate focus of diffusion restriction within the parasagittal left frontal lobe, along the base of the left medial frontal gyrus. No acute hemorrhage. No mass effect. There is multifocal hyperintense T2-weighted signal within the periventricular white matter, most often seen in the setting of chronic microvascular ischemia. There is a volume positive area of hyperintense T2 weighted signal in the left occipital lobe that spares the cortex in a vasogenic edema pattern. There is no associated contrast enhancement. There are multiple old lacunar infarcts of the right basal ganglia and corona radiata. No chronic microhemorrhage or cerebral  amyloid angiopathy. No hydrocephalus, age advanced atrophy or lobar predominant volume loss. No dural abnormality or extra-axial collection. Skull and upper cervical spine: The visualized skull base, calvarium, upper cervical spine and extracranial soft tissues are normal. Sinuses/Orbits: No fluid levels or advanced mucosal thickening. No mastoid effusion. Normal orbits. MRA HEAD FINDINGS Intracranial  internal carotid arteries: Normal. Anterior cerebral arteries: Normal. Middle cerebral arteries: Normal. Posterior communicating arteries: Absent bilaterally. Posterior cerebral arteries: Normal. Basilar artery: Normal. Vertebral arteries: Codominant. Normal. Superior cerebellar arteries: Normal. Anterior inferior cerebellar arteries: Normal. Posterior inferior cerebellar arteries: Normal. IMPRESSION: 1. Punctate focus of acute ischemia within the left medial frontal gyrus without acute hemorrhage or mass effect. 2. Vasogenic edema within the left occipital lobe without associated contrast enhancement. This may indicate a neoplasm such as a low grade glioma. If there are remote prior imaging studies of the brain, comparison would be helpful. A second possibility is a relatively recent, early chronic infarct. Follow-up imaging in approximately 12 weeks is recommended, as the presence or absence of associated volume loss would aid in differentiating the two. 3. 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 4. Normal intracranial MRA. Electronically Signed   By: Deatra Robinson M.D.   On: 11/28/2016 13:50      Lab Results  Component Value Date   HGBA1C 5.2 12/22/2013   HGBA1C 4.8 09/08/2013   Lab Results  Component Value Date   LDLCALC 76 08/18/2015   CREATININE 1.06 (H) 11/29/2016       Scheduled Meds: . ALPRAZolam  0.5 mg Oral TID  . atorvastatin  40 mg Oral q1800  . citalopram  20 mg Oral Daily  . isosorbide mononitrate  60 mg Oral Daily  . lisinopril  5 mg  Oral Daily  . metoprolol succinate  50 mg Oral BID  . montelukast  10 mg Oral Daily  . pantoprazole  40 mg Oral q morning - 10a  . sodium chloride flush  3 mL Intravenous Q12H  . ticagrelor  60 mg Oral BID   Continuous Infusions: . 0.9 % NaCl with KCl 40 mEq / L    . sodium chloride       LOS: 1 day    Time spent: >30 MINS    Richarda Overlie  [facility] Pager [phone]. If 7PM-7AM, please contact night-coverage at [email or web address], password [facility] 11/29/2016, 9:28 AM  LOS: 1 day

## 2016-11-30 ENCOUNTER — Inpatient Hospital Stay (HOSPITAL_COMMUNITY): Payer: BLUE CROSS/BLUE SHIELD

## 2016-11-30 DIAGNOSIS — I6789 Other cerebrovascular disease: Secondary | ICD-10-CM

## 2016-11-30 LAB — CBC
HEMATOCRIT: 35.1 % — AB (ref 36.0–46.0)
HEMOGLOBIN: 11 g/dL — AB (ref 12.0–15.0)
MCH: 29.6 pg (ref 26.0–34.0)
MCHC: 31.3 g/dL (ref 30.0–36.0)
MCV: 94.6 fL (ref 78.0–100.0)
Platelets: 308 10*3/uL (ref 150–400)
RBC: 3.71 MIL/uL — ABNORMAL LOW (ref 3.87–5.11)
RDW: 15.3 % (ref 11.5–15.5)
WBC: 7.2 10*3/uL (ref 4.0–10.5)

## 2016-11-30 LAB — COMPREHENSIVE METABOLIC PANEL
ALBUMIN: 3.2 g/dL — AB (ref 3.5–5.0)
ALT: 10 U/L — ABNORMAL LOW (ref 14–54)
ANION GAP: 9 (ref 5–15)
AST: 17 U/L (ref 15–41)
Alkaline Phosphatase: 77 U/L (ref 38–126)
BILIRUBIN TOTAL: 0.8 mg/dL (ref 0.3–1.2)
BUN: 7 mg/dL (ref 6–20)
CALCIUM: 8.6 mg/dL — AB (ref 8.9–10.3)
CO2: 20 mmol/L — ABNORMAL LOW (ref 22–32)
Chloride: 109 mmol/L (ref 101–111)
Creatinine, Ser: 0.94 mg/dL (ref 0.44–1.00)
Glucose, Bld: 96 mg/dL (ref 65–99)
POTASSIUM: 4 mmol/L (ref 3.5–5.1)
Sodium: 138 mmol/L (ref 135–145)
TOTAL PROTEIN: 6.5 g/dL (ref 6.5–8.1)

## 2016-11-30 LAB — LIPID PANEL
CHOLESTEROL: 105 mg/dL (ref 0–200)
HDL: 34 mg/dL — AB (ref >40)
LDL CALC: 42 mg/dL (ref 0–99)
TRIGLYCERIDES: 143 mg/dL (ref <150)
Total CHOL/HDL Ratio: 3.1 RATIO
VLDL: 29 mg/dL (ref 0–40)

## 2016-11-30 LAB — ECHOCARDIOGRAM COMPLETE
AOASC: 29 cm
CHL CUP DOP CALC LVOT VTI: 19.5 cm
CHL CUP MV DEC (S): 261
E decel time: 261 msec
EERAT: 11.46
FS: 29 % (ref 28–44)
Height: 64 in
IV/PV OW: 1.01
LADIAMINDEX: 2.17 cm/m2
LASIZE: 38 mm
LAVOLA4C: 32.8 mL
LDCA: 3.14 cm2
LEFT ATRIUM END SYS DIAM: 38 mm
LV E/e' medial: 11.46
LV PW d: 11.7 mm — AB (ref 0.6–1.1)
LVEEAVG: 11.46
LVELAT: 4.11 cm/s
LVOT SV: 61 mL
LVOT diameter: 20 mm
LVOT peak vel: 104 cm/s
MV pk E vel: 47.1 m/s
MVPKAVEL: 94.1 m/s
PV Reg vel dias: 72.1 cm/s
RV LATERAL S' VELOCITY: 11.1 cm/s
TAPSE: 17.9 mm
TDI e' lateral: 4.11
TDI e' medial: 3.04
Weight: 2368 oz

## 2016-11-30 LAB — CSF IGG: IgG, CSF: 9.6 mg/dL — ABNORMAL HIGH (ref 0.0–8.6)

## 2016-11-30 MED ORDER — TRAMADOL HCL 50 MG PO TABS: 100.0000 mg | ORAL_TABLET | Freq: Four times a day (QID) | ORAL | 0 refills | Status: AC | PRN

## 2016-11-30 NOTE — Progress Notes (Signed)
Subjective: Patient continues to deny headache, diplopia, blurred vision, weakness, seizures, or other neurologic complaints.  Seen in neurosurgical consultation 2 days ago for nonspecific vasogenic edema in the left occipital lobe. Most likely cerebral vascular in nature, less likely neoplastic in nature. 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 Has been seen in cardiology consultation by Dr. Olga Millers who cleared the patient for surgery and general anesthesia from a cardiology perspective, if a surgical procedure were necessary, and for holding Brillinta perioperatively.  Undergoing stroke workup per neurology hospitalist. Will need continued post discharge neurology follow-up.  Objective: Vital signs in last 24 hours: Vitals:   11/29/16 1900 11/29/16 2113 11/30/16 0141 11/30/16 0502  BP:  132/79 140/86 (!) 184/89  Pulse:  96 79 79  Resp:  20 20 20   Temp:  98.7 F (37.1 C) 98.7 F (37.1 C) 98.2 F (36.8 C)  TempSrc:  Oral Oral Oral  SpO2:  99% 98% 96%  Weight: 67.1 kg (148 lb)     Height: 5\' 4"  (1.626 m)       Intake/Output from previous day: 06/21 0701 - 06/22 0700 In: 960 [P.O.:360; I.V.:600] Out: -  Intake/Output this shift: No intake/output data recorded.  Physical Exam:  Awake and alert, fully oriented to name, [facility], [place], and June 2018. Following commands. Speech fluent. Pupils equal, round, 3.5 mm in diameter, and reactive to light. EOMI. Facial movements symmetrical. 5/5 strength in upper and lower extremities. No drift of upper extremities.  CBC  Recent Labs  11/29/16 0633 11/30/16 0622  WBC 7.8 7.2  HGB 11.3* 11.0*  HCT 35.6* 35.1*  PLT 342 308   BMET  Recent Labs  11/29/16 0633 11/30/16 0622  NA 136 138  K 3.4* 4.0  CL 104 109  CO2 25 20*  GLUCOSE 110* 96  BUN 7 7   CREATININE 1.06* 0.94  CALCIUM 8.6* 8.6*    Studies/Results: Ct Angio Head W Or Wo Contrast  Result Date: 11/29/2016 CLINICAL DATA:  Blurred vision of the left eye. Punctate infarct of the anterior left frontal lobe. EXAM: CT ANGIOGRAPHY HEAD AND NECK TECHNIQUE: Multidetector CT imaging of the head and neck was performed using the standard protocol during bolus administration of intravenous contrast. Multiplanar CT image reconstructions and MIPs were obtained to evaluate the vascular anatomy. Carotid stenosis measurements (when applicable) are obtained utilizing NASCET criteria, using the distal internal carotid diameter as the denominator. CONTRAST:  50 mL Isovue 370 COMPARISON:  MRI brain from the same day. FINDINGS: CT HEAD FINDINGS Brain: Asymmetric subcortical white matter hypoattenuation is again noted in the left occipital lobe. This extends in a posterior left temporal lobe, unchanged from the MRI. Extensive periventricular and subcortical white matter changes are advanced for age. Vascular: Atherosclerotic calcifications are present in the cavernous internal carotid artery's bilaterally. There is no hyperdense vessel. Skull: No focal lytic or blastic lesions are present. The calvarium is intact. Sinuses: The paranasal sinuses are clear. Fluid is again noted in the mastoid air cells, left greater than right. No obstructing nasopharyngeal lesion is present. Orbits: The globes and orbits are within normal limits bilaterally. Review of the MIP images confirms the above findings CTA NECK FINDINGS Aortic arch: There is a common origin of the innominate and left common carotid artery's. Atherosclerotic plaque is noted on the right lateral aspect of the common origin  without a significant stenosis. Additional calcifications are present without aneurysm or stenosis. Right carotid system: The right common carotid artery is within normal limits. And carotid bifurcation is unremarkable. There is some  atherosclerotic change the proximal left right internal carotid artery without a significant stenosis relative to the more distal vessel. Left carotid system: The left common carotid artery is within normal limits. Minimal atherosclerotic changes are present at bifurcation without significant stenosis. Mild tortuosity is present in the cervical ICA without a significant stenosis of the more distal vessel. Vertebral arteries: The vertebral arteries both originate from the subclavian arteries. The vertebral arteries are codominant. There is no significant stenosis of either vertebral artery in the neck. Skeleton: Endplate degenerative changes are most pronounced at C5-6 and C6-7. There is slight degenerative anterolisthesis at C4-5. Facet disease is noted at C2-3, C3-4, and C4-5. No focal lytic or blastic lesions are present. Other neck: The thyroid is within normal limits. No focal mucosal disease is present. The salivary glands are normal. There is no significant adenopathy. Upper chest: Centrilobular emphysema is noted. There is mild dependent atelectasis bilaterally. No focal nodule, mass, or airspace disease is present. Coronary artery calcifications are present. Review of the MIP images confirms the above findings CTA HEAD FINDINGS Anterior circulation: Atherosclerotic calcifications are present within the left cavernous internal carotid artery without a significant stenosis. The internal carotid artery's are otherwise within normal limits to the ICA termini bilaterally. The A1 and M1 segments are normal. The anterior communicating artery is patent. The MCA bifurcations are intact bilaterally. ACA and MCA branch vessels are unremarkable. Posterior circulation: The vertebral arteries are codominant. The PICA origins are visualized and normal. Both posterior cerebral arteries originate from the basilar tip. The PCA branch vessels are within normal limits. Venous sinuses: The dural sinuses are patent. The straight  sinus and deep cerebral veins are intact. Cortical veins are unremarkable. Anatomic variants: None Delayed phase: The postcontrast images exaggerate the white matter changes in the left occipital and temporal lobe. No focal enhancement is present. Review of the MIP images confirms the above findings IMPRESSION: 1. Mild atherosclerotic changes at the carotid bifurcations and proximal internal carotid artery's bilaterally without significant stenosis. 2. Mild atherosclerotic changes at the aortic arch as described. 3. No significant proximal stenosis, aneurysm, or branch vessel occlusion within the circle of Willis. 4. Asymmetric white matter hypoattenuation involving the left occipital lobe as described. The differential diagnosis includes asymmetric posterior reversible encephalopathy syndrome. Recent ischemia is also considered. Neoplasm is considered less likely. Given the extensive white matter disease elsewhere, this likely is vasogenic in origin. Follow-up MRI as previously recommended would be helpful. Electronically Signed   By: Marin Roberts M.D.   On: 11/29/2016 14:44   Dg Chest 2 View  Result Date: 11/28/2016 CLINICAL DATA:  Syncope EXAM: CHEST  2 VIEW COMPARISON:  May 10, 2016 FINDINGS: There is no edema or consolidation. Heart is upper normal in size with pulmonary vascularity within normal limits. No adenopathy. There is an abdominal aortic stent graft. There is aortic atherosclerosis. No evident bone lesions. IMPRESSION: No edema or consolidation. There is aortic atherosclerosis. Abdominal aortic stent graft present. Electronically Signed   By: Bretta Bang III M.D.   On: 11/28/2016 13:55   Ct Angio Neck W Or Wo Contrast  Result Date: 11/29/2016 CLINICAL DATA:  Blurred vision of the left eye. Punctate infarct of the anterior left frontal lobe. EXAM: CT ANGIOGRAPHY HEAD AND NECK TECHNIQUE: Multidetector CT imaging of the head  and neck was performed using the standard protocol  during bolus administration of intravenous contrast. Multiplanar CT image reconstructions and MIPs were obtained to evaluate the vascular anatomy. Carotid stenosis measurements (when applicable) are obtained utilizing NASCET criteria, using the distal internal carotid diameter as the denominator. CONTRAST:  50 mL Isovue 370 COMPARISON:  MRI brain from the same day. FINDINGS: CT HEAD FINDINGS Brain: Asymmetric subcortical white matter hypoattenuation is again noted in the left occipital lobe. This extends in a posterior left temporal lobe, unchanged from the MRI. Extensive periventricular and subcortical white matter changes are advanced for age. Vascular: Atherosclerotic calcifications are present in the cavernous internal carotid artery's bilaterally. There is no hyperdense vessel. Skull: No focal lytic or blastic lesions are present. The calvarium is intact. Sinuses: The paranasal sinuses are clear. Fluid is again noted in the mastoid air cells, left greater than right. No obstructing nasopharyngeal lesion is present. Orbits: The globes and orbits are within normal limits bilaterally. Review of the MIP images confirms the above findings CTA NECK FINDINGS Aortic arch: There is a common origin of the innominate and left common carotid artery's. Atherosclerotic plaque is noted on the right lateral aspect of the common origin without a significant stenosis. Additional calcifications are present without aneurysm or stenosis. Right carotid system: The right common carotid artery is within normal limits. And carotid bifurcation is unremarkable. There is some atherosclerotic change the proximal left right internal carotid artery without a significant stenosis relative to the more distal vessel. Left carotid system: The left common carotid artery is within normal limits. Minimal atherosclerotic changes are present at bifurcation without significant stenosis. Mild tortuosity is present in the cervical ICA without a  significant stenosis of the more distal vessel. Vertebral arteries: The vertebral arteries both originate from the subclavian arteries. The vertebral arteries are codominant. There is no significant stenosis of either vertebral artery in the neck. Skeleton: Endplate degenerative changes are most pronounced at C5-6 and C6-7. There is slight degenerative anterolisthesis at C4-5. Facet disease is noted at C2-3, C3-4, and C4-5. No focal lytic or blastic lesions are present. Other neck: The thyroid is within normal limits. No focal mucosal disease is present. The salivary glands are normal. There is no significant adenopathy. Upper chest: Centrilobular emphysema is noted. There is mild dependent atelectasis bilaterally. No focal nodule, mass, or airspace disease is present. Coronary artery calcifications are present. Review of the MIP images confirms the above findings CTA HEAD FINDINGS Anterior circulation: Atherosclerotic calcifications are present within the left cavernous internal carotid artery without a significant stenosis. The internal carotid artery's are otherwise within normal limits to the ICA termini bilaterally. The A1 and M1 segments are normal. The anterior communicating artery is patent. The MCA bifurcations are intact bilaterally. ACA and MCA branch vessels are unremarkable. Posterior circulation: The vertebral arteries are codominant. The PICA origins are visualized and normal. Both posterior cerebral arteries originate from the basilar tip. The PCA branch vessels are within normal limits. Venous sinuses: The dural sinuses are patent. The straight sinus and deep cerebral veins are intact. Cortical veins are unremarkable. Anatomic variants: None Delayed phase: The postcontrast images exaggerate the white matter changes in the left occipital and temporal lobe. No focal enhancement is present. Review of the MIP images confirms the above findings IMPRESSION: 1. Mild atherosclerotic changes at the carotid  bifurcations and proximal internal carotid artery's bilaterally without significant stenosis. 2. Mild atherosclerotic changes at the aortic arch as described. 3. No significant proximal stenosis, aneurysm,  or branch vessel occlusion within the circle of Willis. 4. Asymmetric white matter hypoattenuation involving the left occipital lobe as described. The differential diagnosis includes asymmetric posterior reversible encephalopathy syndrome. Recent ischemia is also considered. Neoplasm is considered less likely. Given the extensive white matter disease elsewhere, this likely is vasogenic in origin. Follow-up MRI as previously recommended would be helpful. Electronically Signed   By: Marin Roberts M.D.   On: 11/29/2016 14:44   Mr Maxine Glenn Head Wo Contrast  Result Date: 11/28/2016 CLINICAL DATA:  Syncope.  Potential stroke. EXAM: MRI HEAD WITHOUT AND WITH CONTRAST MRA HEAD WITHOUT CONTRAST TECHNIQUE: Multiplanar, multiecho pulse sequences of the brain and surrounding structures were obtained without and with intravenous contrast. Angiographic images of the head were obtained using MRA technique without contrast. CONTRAST:  15 mL MULTIHANCE GADOBENATE DIMEGLUMINE 529 MG/ML IV SOLN COMPARISON:  Head CT 11/28/2016 FINDINGS: MRI HEAD FINDINGS Brain: There is a partially empty sella. There is a punctate focus of diffusion restriction within the parasagittal left frontal lobe, along the base of the left medial frontal gyrus. No acute hemorrhage. No mass effect. There is multifocal hyperintense T2-weighted signal within the periventricular white matter, most often seen in the setting of chronic microvascular ischemia. There is a volume positive area of hyperintense T2 weighted signal in the left occipital lobe that spares the cortex in a vasogenic edema pattern. There is no associated contrast enhancement. There are multiple old lacunar infarcts of the right basal ganglia and corona radiata. No chronic microhemorrhage  or cerebral amyloid angiopathy. No hydrocephalus, age advanced atrophy or lobar predominant volume loss. No dural abnormality or extra-axial collection. Skull and upper cervical spine: The visualized skull base, calvarium, upper cervical spine and extracranial soft tissues are normal. Sinuses/Orbits: No fluid levels or advanced mucosal thickening. No mastoid effusion. Normal orbits. MRA HEAD FINDINGS Intracranial internal carotid arteries: Normal. Anterior cerebral arteries: Normal. Middle cerebral arteries: Normal. Posterior communicating arteries: Absent bilaterally. Posterior cerebral arteries: Normal. Basilar artery: Normal. Vertebral arteries: Codominant. Normal. Superior cerebellar arteries: Normal. Anterior inferior cerebellar arteries: Normal. Posterior inferior cerebellar arteries: Normal. IMPRESSION: 1. Punctate focus of acute ischemia within the left medial frontal gyrus without acute hemorrhage or mass effect. 2. Vasogenic edema within the left occipital lobe without associated contrast enhancement. This may indicate a neoplasm such as a low grade glioma. If there are remote prior imaging studies of the brain, comparison would be helpful. A second possibility is a relatively recent, early chronic infarct. Follow-up imaging in approximately 12 weeks is recommended, as the presence or absence of associated volume loss would aid in differentiating the two. 3. 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 4. Normal intracranial MRA. Electronically Signed   By: Deatra Robinson M.D.   On: 11/28/2016 13:50   Mr Laqueta Jean AV Contrast  Result Date: 11/28/2016 CLINICAL DATA:  Syncope.  Potential stroke. EXAM: MRI HEAD WITHOUT AND WITH CONTRAST MRA HEAD WITHOUT CONTRAST TECHNIQUE: Multiplanar, multiecho pulse sequences of the brain and surrounding structures were obtained without and with intravenous contrast. Angiographic images of the head were obtained using MRA technique  without contrast. CONTRAST:  15 mL MULTIHANCE GADOBENATE DIMEGLUMINE 529 MG/ML IV SOLN COMPARISON:  Head CT 11/28/2016 FINDINGS: MRI HEAD FINDINGS Brain: There is a partially empty sella. There is a punctate focus of diffusion restriction within the parasagittal left frontal lobe, along the base of the left medial frontal gyrus. No acute hemorrhage. No mass effect. There is multifocal hyperintense T2-weighted signal within  the periventricular white matter, most often seen in the setting of chronic microvascular ischemia. There is a volume positive area of hyperintense T2 weighted signal in the left occipital lobe that spares the cortex in a vasogenic edema pattern. There is no associated contrast enhancement. There are multiple old lacunar infarcts of the right basal ganglia and corona radiata. No chronic microhemorrhage or cerebral amyloid angiopathy. No hydrocephalus, age advanced atrophy or lobar predominant volume loss. No dural abnormality or extra-axial collection. Skull and upper cervical spine: The visualized skull base, calvarium, upper cervical spine and extracranial soft tissues are normal. Sinuses/Orbits: No fluid levels or advanced mucosal thickening. No mastoid effusion. Normal orbits. MRA HEAD FINDINGS Intracranial internal carotid arteries: Normal. Anterior cerebral arteries: Normal. Middle cerebral arteries: Normal. Posterior communicating arteries: Absent bilaterally. Posterior cerebral arteries: Normal. Basilar artery: Normal. Vertebral arteries: Codominant. Normal. Superior cerebellar arteries: Normal. Anterior inferior cerebellar arteries: Normal. Posterior inferior cerebellar arteries: Normal. IMPRESSION: 1. Punctate focus of acute ischemia within the left medial frontal gyrus without acute hemorrhage or mass effect. 2. Vasogenic edema within the left occipital lobe without associated contrast enhancement. This may indicate a neoplasm such as a low grade glioma. If there are remote prior  imaging studies of the brain, comparison would be helpful. A second possibility is a relatively recent, early chronic infarct. Follow-up imaging in approximately 12 weeks is recommended, as the presence or absence of associated volume loss would aid in differentiating the two. 3. 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 4. Normal intracranial MRA. Electronically Signed   By: Deatra Robinson M.D.   On: 11/28/2016 13:50    Assessment/Plan: Remains stable from a neurologic exam perspective. Will need MRI of the brain without and with gadolinium for follow-up in about a month.   Hewitt Shorts, MD 11/30/2016, 7:56 AM

## 2016-11-30 NOTE — Evaluation (Signed)
Speech Language Pathology Evaluation Patient Details Name: Olivia Wade MRN: [id number] DOB: 1951/04/26 Today's Date: 11/30/2016 Time: 1015-1040 SLP Time Calculation (min) (ACUTE ONLY): 25 min  Problem List:  Patient Active Problem List   Diagnosis Date Noted  . Brain mass 11/28/2016  . Renal infarct (HCC) 03/01/2016  . Normochromic normocytic anemia 03/01/2016  . S/P AAA repair using bifurcation graft 12/02/2015  . Aneurysm of infrarenal abdominal aorta (HCC) 11/21/2015  . Abdominal aortic aneurysm (AAA) >39 mm diameter (HCC) 11/21/2015  . Chest pain 03/27/2014  . Numbness and tingling in right hand 02/05/2014  . Sinus tachycardia, with exertion 12/23/2013  . Chest pain at rest, negative MI, probable GI 12/22/2013  . Syncope and collapse 12/22/2013  . CAD (coronary artery disease), recent DES stents to RCA and LAD in 3/15 and 4/15 09/14/2013  . NSTEMI (non-ST elevated myocardial infarction) (HCC) 09/08/2013  . Unstable angina (HCC) 09/08/2013  . ACS (acute coronary syndrome) (HCC) 09/08/2013  . Abdominal aortic aneurysm 05/26/2013  . HTN (hypertension) 05/26/2013  . Hyperlipidemia 05/26/2013  . GERD (gastroesophageal reflux disease) 05/26/2013  . Chronic low back pain 05/26/2013  . Asthma with COPD 05/26/2013  . Tobacco abuse 05/26/2013   Past Medical History:  Past Medical History:  Diagnosis Date  . AAA (abdominal aortic aneurysm) (HCC)   . Anxiety    Panic attacks  . Arthritis   . Asthma   . Chronic low back pain    (Old vertebral fracture)  . Complication of anesthesia   . COPD (chronic obstructive pulmonary disease) (HCC)   . Coronary artery disease 09/08/13   with PCI  . Depression   . Family history of adverse reaction to anesthesia     mother - slow to awaken  . GERD (gastroesophageal reflux disease)   . Heart palpitations    prn toprol for rapid HR  . History of surgery on arm   . HLD (hyperlipidemia)   . HTN (hypertension)   . NSTEMI (non-ST elevated  myocardial infarction) (HCC) 09/08/13  . PONV (postoperative nausea and vomiting)   . Renal disorder    ischemic kidney   . Tobacco abuse    Past Surgical History:  Past Surgical History:  Procedure Laterality Date  . 2d echo  12/2012   normal LV with mild LVH, grade 1 diastolic dysfunction  . ABDOMINAL AORTIC ANEURYSM REPAIR  12/02/2015   ABDOMINAL AORTIC ENDOVASCULAR FENESTRATED STENT GRAFT (N/A)  . ABDOMINAL AORTIC DUPLEX  05/19/2012   WHEN COMPARED TO PRIOR STUDY DATED 06/01/11 THERE IS AN INCREASE IN SIZE OF DILATATION.  Marland Kitchen ABDOMINAL AORTIC ENDOVASCULAR FENESTRATED STENT GRAFT N/A 12/02/2015   Procedure: ABDOMINAL AORTIC ENDOVASCULAR FENESTRATED STENT GRAFT;  Surgeon: Nada Libman, MD;  Location: [facility];  Service: Vascular;  Laterality: N/A;  . ABDOMINAL AORTOGRAM N/A 07/24/2016   Procedure: Abdominal Aortogram;  Surgeon: Nada Libman, MD;  Location: [facility] INVASIVE CV LAB;  Service: Cardiovascular;  Laterality: N/A;  . ABDOMINAL HYSTERECTOMY    . CHOLECYSTECTOMY    . CORONARY ANGIOPLASTY WITH STENT PLACEMENT  09/10/13   PCI of RCA 90% proximal RCA stenosis with DES  . CORONARY STENT PLACEMENT  09/08/13   PCI with cutting balloon, PTCA,DES to prox LAD  . CYST EXCISION Left    wrist  . DOPPLER ECHOCARDIOGRAPHY  12/31/2012  . LAPAROSCOPIC NISSEN FUNDOPLICATION     for severe reflux  . LEFT HEART CATHETERIZATION WITH CORONARY ANGIOGRAM N/A 09/08/2013   Procedure: LEFT HEART CATHETERIZATION WITH CORONARY ANGIOGRAM;  Surgeon: Lennette Bihari, MD;  Location[facility] CATH LAB;  Service: Cardiovascular;  Laterality: N/A;  . NUCLEAR STRESS TEST  05/09/2011   NORMAL PATTREN OF PERFUSION IN ALL REGIONS. LV IS NORMAL IS SIZE. EF 68% NO WALL MOTION ABNORMALITIES.  Marland Kitchen PERCUTANEOUS CORONARY STENT INTERVENTION (PCI-S) N/A 09/10/2013   Procedure: PERCUTANEOUS CORONARY STENT INTERVENTION (PCI-S);  Surgeon: Lennette Bihari, MD;  Location: [facility] CATH LAB;  Service: Cardiovascular;  Laterality: N/A;  . PERIPHERAL  VASCULAR INTERVENTION Right 07/24/2016   Procedure: Peripheral Vascular Intervention;  Surgeon: Nada Libman, MD;  Location: [facility] INVASIVE CV LAB;  Service: Cardiovascular;  Laterality: Right;   RT RENAL STENT  LT EXT ILIAC STENT  . STENT PLACE LEFT URETER ([facility] HX)     HPI:  66 y.o. female with medical history significant of depression/anxiety, asthma, COPD, Cory artery disease status post cardiac cardiac catheterization, GERD, hypertension, hyperlipidemia; pt transferred from [facility] on 11/29/16 and c/o "7 day history of intermittent difficulty with speech; inappropriate and unintentional speech with word finding difficulty."   Assessment / Plan / Recommendation Clinical Impression   Pt with intermittent infrequent semantic paraphasias and minimal word finding instances during simple conversational tasks; pt is intelligible within conversational speech; administered MOCA [facility] Cognitive Assessment) with a score obtained of 19/30 with an average score of 26/30, but pt has a 9th grade education and some tasks may not be appropriate for her education level; pt did not have her reading glasses as well and reading/writing tasks were difficult for her to complete and may not be indicative of her full capability.  Denies any dysphagia; memory recall for new information and short-term memory were compromised on assessment, as well as intermittent anomia/semantic paraphasias within simple conversation.  ST will f/u while in house for cognitive/language deficits.    SLP Assessment  SLP Visit Diagnosis: Aphasia (R47.01)    Follow Up Recommendations  Other (comment) (TBD)    Frequency and Duration min 1 x/week  1 week      SLP Evaluation Cognition  Overall Cognitive Status: Difficult to assess Arousal/Alertness: Awake/alert Orientation Level: Oriented X4 Memory: Impaired Memory Impairment: Retrieval deficit;Decreased short term memory Decreased Short Term Memory: Verbal basic;Functional  basic Awareness: Appears intact Problem Solving: Appears intact Safety/Judgment: Appears intact       Comprehension  Auditory Comprehension Overall Auditory Comprehension: Appears within functional limits for tasks assessed Yes/No Questions: Within Functional Limits Commands: Within Functional Limits Conversation: Simple Visual Recognition/Discrimination Discrimination: Not tested Reading Comprehension Reading Status: Unable to assess (comment) (no glasses available; able to read environmental signs)    Expression Expression Primary Mode of Expression: Verbal Verbal Expression Overall Verbal Expression: Impaired Initiation: No impairment Level of Generative/Spontaneous Verbalization: Conversation Repetition: No impairment Naming: No impairment Pragmatics: No impairment Interfering Components: Premorbid deficit Non-Verbal Means of Communication: Not applicable Other Verbal Expression Comments:  (Baseline anomia per pt) Written Expression Dominant Hand: Right Written Expression: Not tested   Oral / Motor  Oral Motor/Sensory Function Overall Oral Motor/Sensory Function: Within functional limits Motor Speech Overall Motor Speech: Appears within functional limits for tasks assessed Respiration: Within functional limits Phonation: Normal Resonance: Within functional limits Articulation: Within functional limitis Intelligibility: Intelligible Motor Planning: Witnin functional limits Motor Speech Errors: Not applicable Interfering Components: Premorbid status            Functional Assessment Tool Used: NOMS; clinical judgment Functional Limitations: Memory Memory Current Status ([id number]): At least 40 percent but less than 60 percent impaired, limited or restricted Memory  Goal Status [phone]): At least 20 percent but less than 40 percent impaired, limited or restricted         Tressie Stalker, M.S., CCC-SLP 11/30/2016, 2:03 PM

## 2016-11-30 NOTE — Progress Notes (Signed)
  Echocardiogram 2D Echocardiogram has been performed.  Olivia Wade Barbara 11/30/2016, 4:15 PM

## 2016-11-30 NOTE — Evaluation (Signed)
Physical Therapy Evaluation Patient Details Name: Olivia Wade MRN: [id number] DOB: 05-22-1951 Today's Date: 11/30/2016   History of Present Illness  pt is a 66 y.o. female with medical history significant of depression/anxiety, asthma, COPD, Cory artery disease status post cardiac cardiac catheterization, GERD, hypertension, hyperlipidemia, who presented with syncope, headache for 3 days, visual field deficits of the left eye, generalized weakness. CT head showed vasogenic edema in the left occipital lobe. Pt has documented CVA in the left medial frontal gyrus  Clinical Impression  Pt presented supine in bed with HOB elevated, awake and willing to participate in therapy session. Prior to admission, pt reported that she ambulates with use of SPC and her husband assists her with entering and exiting tub for safety. Pt ambulated in hallway with min guard and one HHA, mild instability but no LOB. Session limited secondary to tech arriving and needing to perform a bedside test. PT will continue to follow acutely to ensure a safe d/c home.    Follow Up Recommendations No PT follow up    Equipment Recommendations  None recommended by PT    Recommendations for Other Services       Precautions / Restrictions Precautions Precautions: Fall Restrictions Weight Bearing Restrictions: No      Mobility  Bed Mobility Overal bed mobility: Modified Independent Bed Mobility: Supine to Sit;Sit to Supine     Supine to sit: Min assist Sit to supine: Min guard   General bed mobility comments: assist to bring trunk upright, close guard for safety during sit to supine   Transfers Overall transfer level: Needs assistance Equipment used: None Transfers: Sit to/from Stand Sit to Stand: Supervision         General transfer comment: supervision for safety, a bit impulsive  Ambulation/Gait Ambulation/Gait assistance: Min guard Ambulation Distance (Feet): 500 Feet Assistive device: 1 person hand  held assist Gait Pattern/deviations: Step-through pattern;Decreased stride length Gait velocity: decreased Gait velocity interpretation: Below normal speed for age/gender General Gait Details: pt with increased DOE at end of ambulation; however, reported that was her baseline. mild instability with ambulation but no overt LOB or need for physical assistance  Stairs            Wheelchair Mobility    Modified Rankin (Stroke Patients Only)       Balance Overall balance assessment: Needs assistance Sitting-balance support: Feet supported Sitting balance-Leahy Scale: Good     Standing balance support: During functional activity;No upper extremity supported Standing balance-Leahy Scale: Fair                               Pertinent Vitals/Pain Pain Assessment: No/denies pain    Home Living Family/patient expects to be discharged to:: Private residence Living Arrangements: Spouse/significant other Available Help at Discharge: Family;Friend(s);Available 24 hours/day Type of Home: House Home Access: Stairs to enter Entrance Stairs-Rails: Lawyer of Steps: 3 Home Layout: One level Home Equipment: Cane - single point;Walker - 2 wheels      Prior Function Level of Independence: Needs assistance;Independent with assistive device(s)   Gait / Transfers Assistance Needed: using cane for functional mobility, electric scooter when at store   ADL's / Homemaking Assistance Needed: husband assisted for stepping in/out of tub        Hand Dominance   Dominant Hand: Right    Extremity/Trunk Assessment   Upper Extremity Assessment Upper Extremity Assessment: Defer to OT evaluation    Lower  Extremity Assessment Lower Extremity Assessment: Overall WFL for tasks assessed    Cervical / Trunk Assessment Cervical / Trunk Assessment: Normal  Communication   Communication: No difficulties  Cognition Arousal/Alertness: Awake/alert Behavior  During Therapy: Impulsive Overall Cognitive Status: Within Functional Limits for tasks assessed                                 General Comments: Pt does appear slightly impulsive, question whether this may be her baseline       General Comments      Exercises     Assessment/Plan    PT Assessment Patient needs continued PT services  PT Problem List Decreased balance;Decreased mobility;Decreased coordination;Decreased safety awareness       PT Treatment Interventions DME instruction;Gait training;Stair training;Functional mobility training;Therapeutic activities;Therapeutic exercise;Balance training;Neuromuscular re-education;Patient/family education    PT Goals (Current goals can be found in the Care Plan section)  Acute Rehab PT Goals Patient Stated Goal: return home  PT Goal Formulation: With patient Time For Goal Achievement: 12/14/16 Potential to Achieve Goals: Good    Frequency Min 3X/week   Barriers to discharge        Co-evaluation               AM-PAC PT "6 Clicks" Daily Activity  Outcome Measure Difficulty turning over in bed (including adjusting bedclothes, sheets and blankets)?: None Difficulty moving from lying on back to sitting on the side of the bed? : None Difficulty sitting down on and standing up from a chair with arms (e.g., wheelchair, bedside commode, etc,.)?: A Little Help needed moving to and from a bed to chair (including a wheelchair)?: A Little Help needed walking in hospital room?: A Little Help needed climbing 3-5 steps with a railing? : A Little 6 Click Score: 20    End of Session   Activity Tolerance: Patient tolerated treatment well Patient left: in bed;with call bell/phone within reach;with family/visitor present;Other (comment) (tech present to perform bedside procedure) Nurse Communication: Mobility status PT Visit Diagnosis: Other abnormalities of gait and mobility (R26.89)    Time: 1425-1435 PT Time  Calculation (min) (ACUTE ONLY): 10 min   Charges:   PT Evaluation $PT Eval Moderate Complexity: 1 Procedure     PT G Codes:        [name], PT, DPT [phone]   [name] 11/30/2016, 3:28 PM

## 2016-11-30 NOTE — Evaluation (Signed)
Occupational Therapy Evaluation Patient Details Name: Olivia Wade MRN: [id number] DOB: December 28, 1950 Today's Date: 11/30/2016    History of Present Illness 66 y.o. female with medical history significant of depression/anxiety, asthma, COPD, Cory artery disease status post cardiac cardiac catheterization, GERD, hypertension, hyperlipidemia, who presented with syncope, headache for 3 days, visual field deficits of the left eye, generalized weakness. CT head showed vasogenic edema in the left occipital lobe. Pt has documented CVA in the left medial frontal gyrus   Clinical Impression   This 66 y/o F presents with the above. At baseline Pt reports she is Mod independent with functional mobility, reports she receives assist from spouse for stepping in/out of tub, and completes ADLs independently. Pt currently requires MinGuard assist for functional mobility and LB ADLs, demonstrating room and hallway level functional mobility this session with no LOB or significant impairments noted. Pt reports she will have intermittent family assist after return home, and can stay with her daughter initially if she needs to for increased assist. No further acute OT needs identified at this time. Will sign off.     Follow Up Recommendations  No OT follow up;Supervision - Intermittent    Equipment Recommendations  None recommended by OT           Precautions / Restrictions Precautions Precautions: Fall Restrictions Weight Bearing Restrictions: No      Mobility Bed Mobility Overal bed mobility: Needs Assistance Bed Mobility: Supine to Sit;Sit to Supine     Supine to sit: Min assist Sit to supine: Min guard   General bed mobility comments: assist to bring trunk upright, close guard for safety during sit to supine   Transfers Overall transfer level: Needs assistance Equipment used: None Transfers: Sit to/from Stand Sit to Stand: Min guard         General transfer comment: for safety      Balance Overall balance assessment: No apparent balance deficits (not formally assessed)                                         ADL either performed or assessed with clinical judgement   ADL Overall ADL's : Needs assistance/impaired Eating/Feeding: Independent;Sitting   Grooming: Standing;Min guard   Upper Body Bathing: Supervision/ safety;Sitting   Lower Body Bathing: Sit to/from stand;Min guard   Upper Body Dressing : Set up;Sitting   Lower Body Dressing: Sit to/from stand;Min guard Lower Body Dressing Details (indicate cue type and reason): Pt demonstrates figure 4 technique  Toilet Transfer: Ambulation;Regular Toilet;Min Pension scheme manager Details (indicate cue type and reason): simulated with sit to stand at chair  Toileting- Clothing Manipulation and Hygiene: Sit to/from stand;Min guard       Functional mobility during ADLs: Min guard       Vision Baseline Vision/History: Wears glasses Wears Glasses: At all times (supposed to wear at all times, Pt currently not wearing glasses ) Patient Visual Report: No change from baseline Vision Assessment?: No apparent visual deficits                Pertinent Vitals/Pain Pain Assessment: No/denies pain     Hand Dominance Right   Extremity/Trunk Assessment Upper Extremity Assessment Upper Extremity Assessment:  (sensation appears intact to light touch )   Lower Extremity Assessment Lower Extremity Assessment: Defer to PT evaluation   Cervical / Trunk Assessment Cervical / Trunk Assessment: Normal   Communication Communication  Communication: No difficulties   Cognition Arousal/Alertness: Awake/alert Behavior During Therapy: WFL for tasks assessed/performed Overall Cognitive Status: Within Functional Limits for tasks assessed                                 General Comments: Pt does appear slightly impulsive, question whether this may be her baseline    General Comments                   Home Living Family/patient expects to be discharged to:: Private residence Living Arrangements: Spouse/significant other Available Help at Discharge: Family;Friend(s);Available 24 hours/day Type of Home: House Home Access: Stairs to enter [employer] of Steps: 3 Entrance Stairs-Rails: Left;Right Home Layout: One level     Bathroom Shower/Tub: Chief Strategy Officer: Standard Bathroom Accessibility: Yes How Accessible: Accessible via walker Home Equipment: Cane - single point;Walker - 2 wheels          Prior Functioning/Environment Level of Independence: Needs assistance;Independent with assistive device(s)  Gait / Transfers Assistance Needed: using cane for functional mobility, electric scooter when at store  ADL's / Homemaking Assistance Needed: husband assisted for stepping in/out of tub            OT Problem List: Decreased strength;Decreased activity tolerance      OT Treatment/Interventions:      OT Goals(Current goals can be found in the care plan section) Acute Rehab OT Goals Patient Stated Goal: to return home  OT Goal Formulation: With patient                                 AM-PAC PT "6 Clicks" Daily Activity     Outcome Measure Help from another person eating meals?: None Help from another person taking care of personal grooming?: None Help from another person toileting, which includes using toliet, bedpan, or urinal?: A Little Help from another person bathing (including washing, rinsing, drying)?: A Little Help from another person to put on and taking off regular upper body clothing?: None Help from another person to put on and taking off regular lower body clothing?: A Little 6 Click Score: 21   End of Session Equipment Utilized During Treatment: Gait belt Nurse Communication: Mobility status  Activity Tolerance: Patient tolerated treatment well Patient left: in bed;with call bell/phone within  reach;with family/visitor present  OT Visit Diagnosis: Muscle weakness (generalized) (M62.81)                Time: 1610-9604 OT Time Calculation (min): 24 min Charges:  OT General Charges $OT Visit: 1 Procedure OT Evaluation $OT Eval Low Complexity: 1 Procedure G-Codes:     Marcy Siren, OT Pager [phone] 11/30/2016   Orlando Penner 11/30/2016, 3:17 PM

## 2016-11-30 NOTE — Progress Notes (Signed)
Subjective: No complaints.   Exam: Vitals:   11/30/16 0756 11/30/16 0910  BP: (!) 162/73 (!) 162/99  Pulse:  85  Resp:  20  Temp:  98.9 F (37.2 C)    HEENT-  Normocephalic, no lesions, without obvious abnormality.  Normal external eye and conjunctiva.  Normal TM's bilaterally.  Normal auditory canals and external ears. Normal external nose, mucus membranes and septum.  Normal pharynx.    Neuro:  CN: Pupils are equal and round. They are symmetrically reactive from 3-->2 mm. EOMI without nystagmus. Facial sensation is intact to light touch. Face is symmetric at rest with normal strength and mobility. Hearing is intact to conversational voice. Palate elevates symmetrically and uvula is midline. Voice is normal in tone, pitch and quality. Bilateral SCM and trapezii are 5/5. Tongue is midline with normal bulk and mobility.  Motor: Normal bulk, tone, and strength. 5/5 throughout. No drift.  Sensation: Intact to light touch.  DTRs: 2+, symmetric  Toes downgoing bilaterally. No pathologic reflexes.  Coordination: Finger-to-nose and heel-to-shin are without dysmetria     Pertinent Labs/Diagnostics:  Results for TU, FERENCE (MRN [id number]) as of 11/30/2016 09:31  Ref. Range 11/29/2016 10:00 11/29/2016 10:00  Appearance, CSF Latest Ref Range: CLEAR  CLEAR CLEAR  Glucose, CSF Latest Ref Range: 40 - 70 mg/dL 59   RBC Count, CSF Latest Ref Range: 0 /cu mm 2 (H) 0  Lymphs, CSF Latest Ref Range: 40 - 80 % RARE RARE  Color, CSF Latest Ref Range: COLORLESS  COLORLESS COLORLESS  Supernatant Unknown NOT INDICATED NOT INDICATED  Total  Protein, CSF Latest Ref Range: 15 - 45 mg/dL 65 (H)   Tube # Unknown 4 1  WBC, CSF Latest Ref Range: 0 - 5 /cu mm 1 1   IgG, cytology, and culture still pending A1c pending LDL 42  Olivia Morn PA-C [facility] [phone]  Impression: 66 year old female status post fall at home with persistent occipital headache. Hypodensity suspicious for  possible mass was seen within her left occipital lobe. Given that there is possible vasogenic edema, I have considered starting patient on steroids, but her symptoms are relatively mild and even if this is vasogenic edema I'm not certain they are absolutely necessary at this time. Given that I am not certain of the indication, I would favor holding off for now.  One other possibility would be an inflammatory component to this process, though less likely given the lack of enhancement. Without evidence of inflammation on LP, I think this is even less likely.  She also does have an ischemic stroke. She is on dual antiplatelet therapy.   Recommendations: 1) continue antiplatelet therapy 2) continue atorvastatin 3) follow-up with outpatient neurology as indicated by neurosurgery for repeat imaging   Ritta Slot, MD [facility] [phone]  If 7pm- 7am, please page neurology on call as listed in AMION.  11/30/2016, 9:30 AM

## 2016-11-30 NOTE — Discharge Summary (Signed)
Physician Discharge Summary  Olivia Wade MRN: [id number] DOB/AGE: Apr 22, 1951 66 y.o.  PCP: Deloria Lair., MD   Admit date: 11/28/2016 Discharge date: 11/30/2016  Discharge Diagnoses:   Active Problems:   HTN (hypertension)   Hyperlipidemia   GERD (gastroesophageal reflux disease)   Asthma with COPD   CAD (coronary artery disease), recent DES stents to RCA and LAD in 3/15 and 4/15   Brain mass    Follow-up recommendations Follow-up with PCP in 3-5 days , including all  additional recommended appointments as below Follow-up CBC, CMP in 3-5 days Patient should be seen by neuro-oncology consultation by Dr. Shary Key at first available appointment  PCP requested to follow-up on the results of 2-D echo which is pending at this time    Current Discharge Medication List    START taking these medications   Details  traMADol (ULTRAM) 50 MG tablet Take 2 tablets (100 mg total) by mouth every 6 (six) hours as needed for moderate pain. Qty: 30 tablet, Refills: 0      CONTINUE these medications which have NOT CHANGED   Details  albuterol (PROVENTIL HFA;VENTOLIN HFA) 108 (90 BASE) MCG/ACT inhaler Inhale 1-2 puffs into the lungs every 4 (four) hours as needed for wheezing or shortness of breath.     albuterol (PROVENTIL) (2.5 MG/3ML) 0.083% nebulizer solution Take 2.5 mg by nebulization 2 (two) times daily.     ALPRAZolam (XANAX) 0.5 MG tablet Take 0.5 mg by mouth 3 (three) times daily.     aspirin 81 MG chewable tablet Chew 81 mg by mouth daily.    atorvastatin (LIPITOR) 40 MG tablet take 1 tablet by mouth once daily Qty: 30 tablet, Refills: 10    BRILINTA 60 MG TABS tablet take 1 tablet by mouth twice a day Qty: 60 tablet, Refills: 3    citalopram (CELEXA) 20 MG tablet Take 20 mg by mouth daily.  Refills: 1    cyclobenzaprine (FLEXERIL) 10 MG tablet Take 1 tablet (10 mg total) by mouth 3 (three) times daily as needed for muscle spasms. Qty: 30 tablet, Refills: 6     ferrous gluconate (FERGON) 324 MG tablet Take 324 mg by mouth daily with breakfast.    folic acid (FOLVITE) 1 MG tablet take 1 tablet by mouth once daily Qty: 30 tablet, Refills: 6    isosorbide mononitrate (IMDUR) 60 MG 24 hr tablet Take 1 tablet (60 mg total) by mouth daily. Qty: 30 tablet, Refills: 6    lisinopril (PRINIVIL,ZESTRIL) 5 MG tablet take 1 tablet by mouth once daily Qty: 30 tablet, Refills: 6    metoprolol succinate (TOPROL-XL) 50 MG 24 hr tablet take 1 tablet by mouth twice a day Qty: 60 tablet, Refills: 6    montelukast (SINGULAIR) 10 MG tablet Take 10 mg by mouth daily.     pantoprazole (PROTONIX) 40 MG tablet take 1 tablet by mouth every morning Qty: 30 tablet, Refills: 6    acetaminophen (TYLENOL) 325 MG tablet Take 650 mg by mouth every 6 (six) hours as needed for moderate pain.     cholecalciferol (VITAMIN D) 1000 units tablet Take 1,000 Units by mouth daily.     diclofenac sodium (VOLTAREN) 1 % GEL Apply 4 g topically 4 (four) times daily. Qty: 100 g, Refills: 0    Menthol, Topical Analgesic, (ICY HOT BACK EX) Apply 1 application topically daily as needed (on back).    methocarbamol (ROBAXIN) 500 MG tablet Take 1 tablet (500 mg total) by mouth 2 (  two) times daily. Qty: 20 tablet, Refills: 0    nitroGLYCERIN (NITROSTAT) 0.4 MG SL tablet place 1 tablet under the tongue if needed every 5 minutes fo Qty: 25 tablet, Refills: 5      STOP taking these medications     Melatonin 10 MG TABS          Discharge Condition: stable     Discharge Instructions Get Medicines reviewed and adjusted: Please take all your medications with you for your next visit with your Primary MD  Please request your Primary MD to go over all hospital tests and procedure/radiological results at the follow up, please ask your Primary MD to get all Hospital records sent to his/her office.  If you experience worsening of your admission symptoms, develop shortness of breath,  life threatening emergency, suicidal or homicidal thoughts you must seek medical attention immediately by calling 911 or calling your MD immediately if symptoms less severe.  You must read complete instructions/literature along with all the possible adverse reactions/side effects for all the Medicines you take and that have been prescribed to you. Take any new Medicines after you have completely understood and accpet all the possible adverse reactions/side effects.   Do not drive when taking Pain medications.   Do not take more than prescribed Pain, Sleep and Anxiety Medications  Special Instructions: If you have smoked or chewed Tobacco in the last 2 yrs please stop smoking, stop any regular Alcohol and or any Recreational drug use.  Wear Seat belts while driving.  Please note  You were cared for by a hospitalist during your hospital stay. Once you are discharged, your primary care physician will handle any further medical issues. Please note that NO REFILLS for any discharge medications will be authorized once you are discharged, as it is imperative that you return to your primary care physician (or establish a relationship with a primary care physician if you do not have one) for your aftercare needs so that they can reassess your need for medications and monitor your lab values.  Discharge Instructions    Ambulatory referral to Hematology / Oncology    Complete by:  As directed    Dr. Shary Key, a neuro oncologist to further evaluate brain mass   Diet - low sodium heart healthy    Complete by:  As directed    Increase activity slowly    Complete by:  As directed        Allergies  Allergen Reactions  . Bee Venom Anaphylaxis, Hives and Swelling  . Hydrocodone Other (See Comments)  . Penicillins Other (See Comments)    PATIENT REPORTS "MOTHER TOLD HER SHE HAD ALLERGY TO PCN"  UNKNOWN REACTION TO PCN PCN reaction causing immediate rash, facial/tongue/throat swelling, SOB or  lightheadedness with hypotension: unknown PCN reaction causing severe rash involving mucus membranes or skin necrosis: unknown PCN reaction that required hospitalization unknown PCN reaction occurring within the last 10 years: unknown  . Morphine And Related Rash      Disposition: 01-Home or Self Care   Consults:  Neurology Cardiology Neurosurgery    Significant Diagnostic Studies:  Ct Abdomen Pelvis Wo Contrast  Result Date: 11/01/2016 CLINICAL DATA:  66 year old female with right flank pain, burning sensation for 2 weeks. EXAM: CT ABDOMEN AND PELVIS WITHOUT CONTRAST TECHNIQUE: Multidetector CT imaging of the abdomen and pelvis was performed following the standard protocol without IV contrast. COMPARISON:  CTA abdomen and pelvis 07/09/2016 and earlier. FINDINGS: Lower chest: Chronic medial right lower lobe  atelectasis or scarring is stable. No acute lung base opacity, pericardial or pleural effusion. Hepatobiliary: Surgically absent gallbladder. Negative noncontrast liver. Pancreas: Negative. Spleen: Negative. Adrenals/Urinary Tract: Normal adrenal glands. Chronic right renal atrophy. The right kidney and right ureter appears stable since January. No right side urologic calculus. More normal appearance of the noncontrast left kidney. No acute left perinephric stranding. Negative course of the left ureter. Urinary bladder remarkable for mild cystocele which appears related to pelvic floor laxity (sagittal image 60). Stomach/Bowel: Decompressed distal colon and left colon. Gas and stool in the transverse colon which is not dilated. Moderate retained stool throughout the right colon which is redundant. The cecum is located in the pelvis. Diminutive or absent appendix. Oral contrast has reached the distal small bowel but not yet the terminal ileum. Flocculated material in the terminal ileum. No dilated or inflamed small bowel. Moderate distension of the stomach with oral contrast. Negative  duodenum. No abdominal free fluid. Vascular/Lymphatic: Vascular patency is not evaluated in the absence of IV contrast. 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 Reproductive: Surgically absent uterus.  Diminutive adnexa. Other: No pelvic free fluid. Musculoskeletal: Osteopenia. Widespread mild endplate compression in the lower thoracic and lumbar spine. Stable visualized osseous structures. IMPRESSION: 1. No acute or inflammatory process identified in the abdomen or pelvis. 2. Chronic right renal atrophy. No urologic calculus or obstructive uropathy. 3. Multilevel lower thoracic and lumbar spine vertebral body compression appears stable since January. 4. Chronic abdominal aortic endograft. Electronically Signed   By: Genevie Ann M.D.   On: 11/01/2016 20:46   Ct Angio Head W Or Wo Contrast  Result Date: 11/29/2016 CLINICAL DATA:  Blurred vision of the left eye. Punctate infarct of the anterior left frontal lobe. EXAM: CT ANGIOGRAPHY HEAD AND NECK TECHNIQUE: Multidetector CT imaging of the head and neck was performed using the standard protocol during bolus administration of intravenous contrast. Multiplanar CT image reconstructions and MIPs were obtained to evaluate the vascular anatomy. Carotid stenosis measurements (when applicable) are obtained utilizing NASCET criteria, using the distal internal carotid diameter as the denominator. CONTRAST:  50 mL Isovue 370 COMPARISON:  MRI brain from the same day. FINDINGS: CT HEAD FINDINGS Brain: Asymmetric subcortical white matter hypoattenuation is again noted in the left occipital lobe. This extends in a posterior left temporal lobe, unchanged from the MRI. Extensive periventricular and subcortical white matter changes are advanced for age. Vascular: Atherosclerotic calcifications are present in the cavernous internal carotid artery's bilaterally.  There is no hyperdense vessel. Skull: No focal lytic or blastic lesions are present. The calvarium is intact. Sinuses: The paranasal sinuses are clear. Fluid is again noted in the mastoid air cells, left greater than right. No obstructing nasopharyngeal lesion is present. Orbits: The globes and orbits are within normal limits bilaterally. Review of the MIP images confirms the above findings CTA NECK FINDINGS Aortic arch: There is a common origin of the innominate and left common carotid artery's. Atherosclerotic plaque is noted on the right lateral aspect of the common origin without a significant stenosis. Additional calcifications are present without aneurysm or stenosis. Right carotid system: The right common carotid artery is within normal limits. And carotid bifurcation is unremarkable. There is some atherosclerotic change the proximal left right internal carotid artery without a significant stenosis relative to the more distal vessel. Left carotid system: The left common carotid artery is within normal limits. Minimal atherosclerotic  changes are present at bifurcation without significant stenosis. Mild tortuosity is present in the cervical ICA without a significant stenosis of the more distal vessel. Vertebral arteries: The vertebral arteries both originate from the subclavian arteries. The vertebral arteries are codominant. There is no significant stenosis of either vertebral artery in the neck. Skeleton: Endplate degenerative changes are most pronounced at C5-6 and C6-7. There is slight degenerative anterolisthesis at C4-5. Facet disease is noted at C2-3, C3-4, and C4-5. No focal lytic or blastic lesions are present. Other neck: The thyroid is within normal limits. No focal mucosal disease is present. The salivary glands are normal. There is no significant adenopathy. Upper chest: Centrilobular emphysema is noted. There is mild dependent atelectasis bilaterally. No focal nodule, mass, or airspace disease is  present. Coronary artery calcifications are present. Review of the MIP images confirms the above findings CTA HEAD FINDINGS Anterior circulation: Atherosclerotic calcifications are present within the left cavernous internal carotid artery without a significant stenosis. The internal carotid artery's are otherwise within normal limits to the ICA termini bilaterally. The A1 and M1 segments are normal. The anterior communicating artery is patent. The MCA bifurcations are intact bilaterally. ACA and MCA branch vessels are unremarkable. Posterior circulation: The vertebral arteries are codominant. The PICA origins are visualized and normal. Both posterior cerebral arteries originate from the basilar tip. The PCA branch vessels are within normal limits. Venous sinuses: The dural sinuses are patent. The straight sinus and deep cerebral veins are intact. Cortical veins are unremarkable. Anatomic variants: None Delayed phase: The postcontrast images exaggerate the white matter changes in the left occipital and temporal lobe. No focal enhancement is present. Review of the MIP images confirms the above findings IMPRESSION: 1. Mild atherosclerotic changes at the carotid bifurcations and proximal internal carotid artery's bilaterally without significant stenosis. 2. Mild atherosclerotic changes at the aortic arch as described. 3. No significant proximal stenosis, aneurysm, or branch vessel occlusion within the circle of Willis. 4. Asymmetric white matter hypoattenuation involving the left occipital lobe as described. The differential diagnosis includes asymmetric posterior reversible encephalopathy syndrome. Recent ischemia is also considered. Neoplasm is considered less likely. Given the extensive white matter disease elsewhere, this likely is vasogenic in origin. Follow-up MRI as previously recommended would be helpful. Electronically Signed   By: San Morelle M.D.   On: 11/29/2016 14:44   Dg Chest 2 View  Result  Date: 11/28/2016 CLINICAL DATA:  Syncope EXAM: CHEST  2 VIEW COMPARISON:  May 10, 2016 FINDINGS: There is no edema or consolidation. Heart is upper normal in size with pulmonary vascularity within normal limits. No adenopathy. There is an abdominal aortic stent graft. There is aortic atherosclerosis. No evident bone lesions. IMPRESSION: No edema or consolidation. There is aortic atherosclerosis. Abdominal aortic stent graft present. Electronically Signed   By: Lowella Grip III M.D.   On: 11/28/2016 13:55   Ct Angio Neck W Or Wo Contrast  Result Date: 11/29/2016 CLINICAL DATA:  Blurred vision of the left eye. Punctate infarct of the anterior left frontal lobe. EXAM: CT ANGIOGRAPHY HEAD AND NECK TECHNIQUE: Multidetector CT imaging of the head and neck was performed using the standard protocol during bolus administration of intravenous contrast. Multiplanar CT image reconstructions and MIPs were obtained to evaluate the vascular anatomy. Carotid stenosis measurements (when applicable) are obtained utilizing NASCET criteria, using the distal internal carotid diameter as the denominator. CONTRAST:  50 mL Isovue 370 COMPARISON:  MRI brain from the same day. FINDINGS: CT HEAD FINDINGS Brain:  Asymmetric subcortical white matter hypoattenuation is again noted in the left occipital lobe. This extends in a posterior left temporal lobe, unchanged from the MRI. Extensive periventricular and subcortical white matter changes are advanced for age. Vascular: Atherosclerotic calcifications are present in the cavernous internal carotid artery's bilaterally. There is no hyperdense vessel. Skull: No focal lytic or blastic lesions are present. The calvarium is intact. Sinuses: The paranasal sinuses are clear. Fluid is again noted in the mastoid air cells, left greater than right. No obstructing nasopharyngeal lesion is present. Orbits: The globes and orbits are within normal limits bilaterally. Review of the MIP images  confirms the above findings CTA NECK FINDINGS Aortic arch: There is a common origin of the innominate and left common carotid artery's. Atherosclerotic plaque is noted on the right lateral aspect of the common origin without a significant stenosis. Additional calcifications are present without aneurysm or stenosis. Right carotid system: The right common carotid artery is within normal limits. And carotid bifurcation is unremarkable. There is some atherosclerotic change the proximal left right internal carotid artery without a significant stenosis relative to the more distal vessel. Left carotid system: The left common carotid artery is within normal limits. Minimal atherosclerotic changes are present at bifurcation without significant stenosis. Mild tortuosity is present in the cervical ICA without a significant stenosis of the more distal vessel. Vertebral arteries: The vertebral arteries both originate from the subclavian arteries. The vertebral arteries are codominant. There is no significant stenosis of either vertebral artery in the neck. Skeleton: Endplate degenerative changes are most pronounced at C5-6 and C6-7. There is slight degenerative anterolisthesis at C4-5. Facet disease is noted at C2-3, C3-4, and C4-5. No focal lytic or blastic lesions are present. Other neck: The thyroid is within normal limits. No focal mucosal disease is present. The salivary glands are normal. There is no significant adenopathy. Upper chest: Centrilobular emphysema is noted. There is mild dependent atelectasis bilaterally. No focal nodule, mass, or airspace disease is present. Coronary artery calcifications are present. Review of the MIP images confirms the above findings CTA HEAD FINDINGS Anterior circulation: Atherosclerotic calcifications are present within the left cavernous internal carotid artery without a significant stenosis. The internal carotid artery's are otherwise within normal limits to the ICA termini  bilaterally. The A1 and M1 segments are normal. The anterior communicating artery is patent. The MCA bifurcations are intact bilaterally. ACA and MCA branch vessels are unremarkable. Posterior circulation: The vertebral arteries are codominant. The PICA origins are visualized and normal. Both posterior cerebral arteries originate from the basilar tip. The PCA branch vessels are within normal limits. Venous sinuses: The dural sinuses are patent. The straight sinus and deep cerebral veins are intact. Cortical veins are unremarkable. Anatomic variants: None Delayed phase: The postcontrast images exaggerate the white matter changes in the left occipital and temporal lobe. No focal enhancement is present. Review of the MIP images confirms the above findings IMPRESSION: 1. Mild atherosclerotic changes at the carotid bifurcations and proximal internal carotid artery's bilaterally without significant stenosis. 2. Mild atherosclerotic changes at the aortic arch as described. 3. No significant proximal stenosis, aneurysm, or branch vessel occlusion within the circle of Willis. 4. Asymmetric white matter hypoattenuation involving the left occipital lobe as described. The differential diagnosis includes asymmetric posterior reversible encephalopathy syndrome. Recent ischemia is also considered. Neoplasm is considered less likely. Given the extensive white matter disease elsewhere, this likely is vasogenic in origin. Follow-up MRI as previously recommended would be helpful. Electronically Signed   By:  San Morelle M.D.   On: 11/29/2016 14:44   Mr Jodene Nam Head Wo Contrast  Result Date: 11/28/2016 CLINICAL DATA:  Syncope.  Potential stroke. EXAM: MRI HEAD WITHOUT AND WITH CONTRAST MRA HEAD WITHOUT CONTRAST TECHNIQUE: Multiplanar, multiecho pulse sequences of the brain and surrounding structures were obtained without and with intravenous contrast. Angiographic images of the head were obtained using MRA technique without  contrast. CONTRAST:  15 mL MULTIHANCE GADOBENATE DIMEGLUMINE 529 MG/ML IV SOLN COMPARISON:  Head CT 11/28/2016 FINDINGS: MRI HEAD FINDINGS Brain: There is a partially empty sella. There is a punctate focus of diffusion restriction within the parasagittal left frontal lobe, along the base of the left medial frontal gyrus. No acute hemorrhage. No mass effect. There is multifocal hyperintense T2-weighted signal within the periventricular white matter, most often seen in the setting of chronic microvascular ischemia. There is a volume positive area of hyperintense T2 weighted signal in the left occipital lobe that spares the cortex in a vasogenic edema pattern. There is no associated contrast enhancement. There are multiple old lacunar infarcts of the right basal ganglia and corona radiata. No chronic microhemorrhage or cerebral amyloid angiopathy. No hydrocephalus, age advanced atrophy or lobar predominant volume loss. No dural abnormality or extra-axial collection. Skull and upper cervical spine: The visualized skull base, calvarium, upper cervical spine and extracranial soft tissues are normal. Sinuses/Orbits: No fluid levels or advanced mucosal thickening. No mastoid effusion. Normal orbits. MRA HEAD FINDINGS Intracranial internal carotid arteries: Normal. Anterior cerebral arteries: Normal. Middle cerebral arteries: Normal. Posterior communicating arteries: Absent bilaterally. Posterior cerebral arteries: Normal. Basilar artery: Normal. Vertebral arteries: Codominant. Normal. Superior cerebellar arteries: Normal. Anterior inferior cerebellar arteries: Normal. Posterior inferior cerebellar arteries: Normal. IMPRESSION: 1. Punctate focus of acute ischemia within the left medial frontal gyrus without acute hemorrhage or mass effect. 2. Vasogenic edema within the left occipital lobe without associated contrast enhancement. This may indicate a neoplasm such as a low grade glioma. If there are remote prior imaging  studies of the brain, comparison would be helpful. A second possibility is a relatively recent, early chronic infarct. Follow-up imaging in approximately 12 weeks is recommended, as the presence or absence of associated volume loss would aid in differentiating the two. 3. 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 4. Normal intracranial MRA. Electronically Signed   By: Ulyses Jarred M.D.   On: 11/28/2016 13:50   Mr Jeri Cos OI Contrast  Result Date: 11/28/2016 CLINICAL DATA:  Syncope.  Potential stroke. EXAM: MRI HEAD WITHOUT AND WITH CONTRAST MRA HEAD WITHOUT CONTRAST TECHNIQUE: Multiplanar, multiecho pulse sequences of the brain and surrounding structures were obtained without and with intravenous contrast. Angiographic images of the head were obtained using MRA technique without contrast. CONTRAST:  15 mL MULTIHANCE GADOBENATE DIMEGLUMINE 529 MG/ML IV SOLN COMPARISON:  Head CT 11/28/2016 FINDINGS: MRI HEAD FINDINGS Brain: There is a partially empty sella. There is a punctate focus of diffusion restriction within the parasagittal left frontal lobe, along the base of the left medial frontal gyrus. No acute hemorrhage. No mass effect. There is multifocal hyperintense T2-weighted signal within the periventricular white matter, most often seen in the setting of chronic microvascular ischemia. There is a volume positive area of hyperintense T2 weighted signal in the left occipital lobe that spares the cortex in a vasogenic edema pattern. There is no associated contrast enhancement. There are multiple old lacunar infarcts of the right basal ganglia and corona radiata. No chronic microhemorrhage or cerebral amyloid angiopathy.  No hydrocephalus, age advanced atrophy or lobar predominant volume loss. No dural abnormality or extra-axial collection. Skull and upper cervical spine: The visualized skull base, calvarium, upper cervical spine and extracranial soft tissues are normal.  Sinuses/Orbits: No fluid levels or advanced mucosal thickening. No mastoid effusion. Normal orbits. MRA HEAD FINDINGS Intracranial internal carotid arteries: Normal. Anterior cerebral arteries: Normal. Middle cerebral arteries: Normal. Posterior communicating arteries: Absent bilaterally. Posterior cerebral arteries: Normal. Basilar artery: Normal. Vertebral arteries: Codominant. Normal. Superior cerebellar arteries: Normal. Anterior inferior cerebellar arteries: Normal. Posterior inferior cerebellar arteries: Normal. IMPRESSION: 1. Punctate focus of acute ischemia within the left medial frontal gyrus without acute hemorrhage or mass effect. 2. Vasogenic edema within the left occipital lobe without associated contrast enhancement. This may indicate a neoplasm such as a low grade glioma. If there are remote prior imaging studies of the brain, comparison would be helpful. A second possibility is a relatively recent, early chronic infarct. Follow-up imaging in approximately 12 weeks is recommended, as the presence or absence of associated volume loss would aid in differentiating the two. 3. 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 4. Normal intracranial MRA. Electronically Signed   By: Ulyses Jarred M.D.   On: 11/28/2016 13:50             Filed Weights   11/29/16 1900  Weight: 67.1 kg (148 lb)     Microbiology: Recent Results (from the past 240 hour(s))  CSF culture with Stat gram stain     Status: None (Preliminary result)   Collection Time: 11/29/16 10:09 AM  Result Value Ref Range Status   Specimen Description CSF  Final   Special Requests NONE  Final   Gram Stain   Final    WBC PRESENT, PREDOMINANTLY MONONUCLEAR NO ORGANISMS SEEN CYTOSPIN SMEAR    Culture NO GROWTH < 24 HOURS  Final   Report Status PENDING  Incomplete       Blood Culture    Component Value Date/Time   SDES CSF 11/29/2016 1009   SPECREQUEST NONE 11/29/2016 1009   CULT  NO GROWTH < 24 HOURS 11/29/2016 1009   REPTSTATUS PENDING 11/29/2016 1009      Labs: Results for orders placed or performed during the hospital encounter of 11/28/16 (from the past 48 hour(s))  Comprehensive metabolic panel     Status: Abnormal   Collection Time: 11/28/16 10:45 AM  Result Value Ref Range   Sodium 136 135 - 145 mmol/L   Potassium 3.4 (L) 3.5 - 5.1 mmol/L   Chloride 104 101 - 111 mmol/L   CO2 27 22 - 32 mmol/L   Glucose, Bld 122 (H) 65 - 99 mg/dL   BUN 6 6 - 20 mg/dL   Creatinine, Ser 0.96 0.44 - 1.00 mg/dL   Calcium 8.8 (L) 8.9 - 10.3 mg/dL   Total Protein 7.0 6.5 - 8.1 g/dL   Albumin 3.5 3.5 - 5.0 g/dL   AST 17 15 - 41 U/L   ALT 12 (L) 14 - 54 U/L   Alkaline Phosphatase 94 38 - 126 U/L   Total Bilirubin 0.9 0.3 - 1.2 mg/dL   GFR calc non Af Amer >60 >60 mL/min   GFR calc Af Amer >60 >60 mL/min    Comment: (NOTE) The eGFR has been calculated using the CKD EPI equation. This calculation has not been validated in all clinical situations. eGFR's persistently <60 mL/min signify possible Chronic Kidney Disease.    Anion gap 5 5 - 15  CBC     Status: Abnormal   Collection Time: 11/28/16 10:45 AM  Result Value Ref Range   WBC 7.2 4.0 - 10.5 K/uL   RBC 3.77 (L) 3.87 - 5.11 MIL/uL   Hemoglobin 11.0 (L) 12.0 - 15.0 g/dL   HCT 34.7 (L) 36.0 - 46.0 %   MCV 92.0 78.0 - 100.0 fL   MCH 29.2 26.0 - 34.0 pg   MCHC 31.7 30.0 - 36.0 g/dL   RDW 14.9 11.5 - 15.5 %   Platelets 335 150 - 400 K/uL  Sedimentation rate     Status: Abnormal   Collection Time: 11/28/16 10:45 AM  Result Value Ref Range   Sed Rate 32 (H) 0 - 22 mm/hr  C-reactive protein     Status: None   Collection Time: 11/28/16 10:45 AM  Result Value Ref Range   CRP <0.8 <1.0 mg/dL  APTT     Status: None   Collection Time: 11/28/16 10:45 AM  Result Value Ref Range   aPTT 30 24 - 36 seconds  Protime-INR     Status: None   Collection Time: 11/28/16 10:45 AM  Result Value Ref Range   Prothrombin Time  13.8 11.4 - 15.2 seconds   INR 1.05   Troponin I     Status: None   Collection Time: 11/28/16 10:45 AM  Result Value Ref Range   Troponin I <0.03 <0.03 ng/mL  HIV antibody (Routine Testing)     Status: None   Collection Time: 11/28/16 10:45 AM  Result Value Ref Range   HIV Screen 4th Generation wRfx Non Reactive Non Reactive    Comment: (NOTE) Performed At: [facility] [place], [place] [id number] Lindon Romp MD NI:[id number]   CBC     Status: Abnormal   Collection Time: 11/29/16  6:33 AM  Result Value Ref Range   WBC 7.8 4.0 - 10.5 K/uL   RBC 3.80 (L) 3.87 - 5.11 MIL/uL   Hemoglobin 11.3 (L) 12.0 - 15.0 g/dL   HCT 35.6 (L) 36.0 - 46.0 %   MCV 93.7 78.0 - 100.0 fL   MCH 29.7 26.0 - 34.0 pg   MCHC 31.7 30.0 - 36.0 g/dL   RDW 15.3 11.5 - 15.5 %   Platelets 342 150 - 400 K/uL  Basic metabolic panel     Status: Abnormal   Collection Time: 11/29/16  6:33 AM  Result Value Ref Range   Sodium 136 135 - 145 mmol/L   Potassium 3.4 (L) 3.5 - 5.1 mmol/L   Chloride 104 101 - 111 mmol/L   CO2 25 22 - 32 mmol/L   Glucose, Bld 110 (H) 65 - 99 mg/dL   BUN 7 6 - 20 mg/dL   Creatinine, Ser 1.06 (H) 0.44 - 1.00 mg/dL   Calcium 8.6 (L) 8.9 - 10.3 mg/dL   GFR calc non Af Amer 54 (L) >60 mL/min   GFR calc Af Amer >60 >60 mL/min    Comment: (NOTE) The eGFR has been calculated using the CKD EPI equation. This calculation has not been validated in all clinical situations. eGFR's persistently <60 mL/min signify possible Chronic Kidney Disease.    Anion gap 7 5 - 15  CSF cell count with differential collection tube #: 1     Status: None   Collection Time: 11/29/16 10:00 AM  Result Value Ref Range   Tube # 1    Color, CSF COLORLESS COLORLESS   Appearance, CSF CLEAR CLEAR   Supernatant  NOT INDICATED    RBC Count, CSF 0 0 /cu mm   WBC, CSF 1 0 - 5 /cu mm   Lymphs, CSF RARE 40 - 80 %    Comment: TOO FEW TO COUNT, SMEAR AVAILABLE FOR REVIEW  CSF cell count with  differential collection tube #: 4     Status: Abnormal   Collection Time: 11/29/16 10:00 AM  Result Value Ref Range   Tube # 4    Color, CSF COLORLESS COLORLESS   Appearance, CSF CLEAR CLEAR   Supernatant NOT INDICATED    RBC Count, CSF 2 (H) 0 /cu mm   WBC, CSF 1 0 - 5 /cu mm   Lymphs, CSF RARE 40 - 80 %    Comment: TOO FEW TO COUNT, SMEAR AVAILABLE FOR REVIEW  Protein and glucose, CSF     Status: Abnormal   Collection Time: 11/29/16 10:00 AM  Result Value Ref Range   Glucose, CSF 59 40 - 70 mg/dL   Total  Protein, CSF 65 (H) 15 - 45 mg/dL  CSF culture with Stat gram stain     Status: None (Preliminary result)   Collection Time: 11/29/16 10:09 AM  Result Value Ref Range   Specimen Description CSF    Special Requests NONE    Gram Stain      WBC PRESENT, PREDOMINANTLY MONONUCLEAR NO ORGANISMS SEEN CYTOSPIN SMEAR    Culture NO GROWTH < 24 HOURS    Report Status PENDING   Comprehensive metabolic panel     Status: Abnormal   Collection Time: 11/30/16  6:22 AM  Result Value Ref Range   Sodium 138 135 - 145 mmol/L   Potassium 4.0 3.5 - 5.1 mmol/L   Chloride 109 101 - 111 mmol/L   CO2 20 (L) 22 - 32 mmol/L   Glucose, Bld 96 65 - 99 mg/dL   BUN 7 6 - 20 mg/dL   Creatinine, Ser 0.94 0.44 - 1.00 mg/dL   Calcium 8.6 (L) 8.9 - 10.3 mg/dL   Total Protein 6.5 6.5 - 8.1 g/dL   Albumin 3.2 (L) 3.5 - 5.0 g/dL   AST 17 15 - 41 U/L   ALT 10 (L) 14 - 54 U/L   Alkaline Phosphatase 77 38 - 126 U/L   Total Bilirubin 0.8 0.3 - 1.2 mg/dL   GFR calc non Af Amer >60 >60 mL/min   GFR calc Af Amer >60 >60 mL/min    Comment: (NOTE) The eGFR has been calculated using the CKD EPI equation. This calculation has not been validated in all clinical situations. eGFR's persistently <60 mL/min signify possible Chronic Kidney Disease.    Anion gap 9 5 - 15  CBC     Status: Abnormal   Collection Time: 11/30/16  6:22 AM  Result Value Ref Range   WBC 7.2 4.0 - 10.5 K/uL   RBC 3.71 (L) 3.87 - 5.11  MIL/uL   Hemoglobin 11.0 (L) 12.0 - 15.0 g/dL   HCT 35.1 (L) 36.0 - 46.0 %   MCV 94.6 78.0 - 100.0 fL   MCH 29.6 26.0 - 34.0 pg   MCHC 31.3 30.0 - 36.0 g/dL   RDW 15.3 11.5 - 15.5 %   Platelets 308 150 - 400 K/uL  Lipid panel     Status: Abnormal   Collection Time: 11/30/16  6:22 AM  Result Value Ref Range   Cholesterol 105 0 - 200 mg/dL   Triglycerides 143 <150 mg/dL   HDL 34 (L) >40 mg/dL  Total CHOL/HDL Ratio 3.1 RATIO   VLDL 29 0 - 40 mg/dL   LDL Cholesterol 42 0 - 99 mg/dL    Comment:        Total Cholesterol/HDL:CHD Risk Coronary Heart Disease Risk Table                     Men   Women  1/2 Average Risk   3.4   3.3  Average Risk       5.0   4.4  2 X Average Risk   9.6   7.1  3 X Average Risk  23.4   11.0        Use the calculated Patient Ratio above and the CHD Risk Table to determine the patient's CHD Risk.        ATP III CLASSIFICATION (LDL):  <100     mg/dL   Optimal  100-129  mg/dL   Near or Above                    Optimal  130-159  mg/dL   Borderline  160-189  mg/dL   High  >190     mg/dL   Very High      Lipid Panel     Component Value Date/Time   CHOL 105 11/30/2016 0622   TRIG 143 11/30/2016 0622   HDL 34 (L) 11/30/2016 0622   CHOLHDL 3.1 11/30/2016 0622   VLDL 29 11/30/2016 0622   LDLCALC 42 11/30/2016 0622     Lab Results  Component Value Date   HGBA1C 5.2 12/22/2013   HGBA1C 4.8 09/08/2013      HPI :  66 year old female with a history of AAA,  status post coronary stenting by Dr.Tom Claiborne Billings (continues on [place]), atherosclerotic peripheral vascular disease status post numerous vascular reconstructions and revascularizations, COPD, hyperlipidemia , found after being found on the floor ,taken to [facility]). It is unclear whether this actually represents a syncopal episode or not. CT of the head was done and showed an area of hypodensity in the left occipital lobe, and the patient was subsequent transferred to the  triad hospitalist service at [facility] for further evaluation. Patient was evaluated by neurology, she had an MRI that showed small area of ischemia in the left medial frontal gyrus as well as evidence of edema in the left occipital lobe (per the interpreting radiologist) radiologist raises the question of a tumor and neurosurgical consultation was requested.   HOSPITAL COURSE:  * Hypodensity suspicious for possible mass was seen within her left occipital lobe  MRI  acute ischemia within the left medial frontal gyrus without acute hemorrhage or mass effect. Patient evaluated by neurosurgery Dr. Sherwood Gambler. Unclear etiology. Given extensive vasculopathic history, most likely causes cerebrovascular disease vs low-grade glioma. Both neurology and neurosurgery recommended follow-up with Dr. Shary Key, a neuro oncologist , to decide between neurosurgical intervention vs  craniotomy for biopsy next month Before that the patient would need repeat MRI brain with and without gadolinium within the next month, this is to be arranged for by PCP Symptoms are 1 wk of dysarthria, mild LUE dysmetria, purple spots in L visual field, syncope x1, constant HA. Stroke an autoimmune processes are considered to be less likely by neurology, based on imaging studies and LP results. Discussed with Dr. Katherine Roan Cardiology consultation has been obtained for preop clearance, okay to hold Brillinta perioperatively for 7 days if needed   Syncope: Likely due  to mass/abnormality as above. No reported szr. No significant metabolic derangements as noted per my review of records from [facility]. Doubt this is due to medication side effect. - Patient noted to be slightly orthostatic on admission with a 22 mm hg drop.systolic blood pressure - Telemetry-sinus rhythm     HTN: Increased  lisinopril to 20 mg a day. Continue lisinopril, metoprolol, Imdur    Anxiety/depression:  - Continue Xanax, Celexa  COPD: Due to  years of tobacco abuse. Quit several years ago. No evidence of acute Station. Noted to requirement. - Continue Singulair - Albuterol when necessary  GERD: - Continue Protonix  Chronic pain: - continue flexeril  CAD/MI: EKG without signs of ACS. Peterson syncopal episode keep on telemetry and obtain troponin 1. - Continue Brilinta, Lipitor, ASA 81cardiology consult for preop clearance  Nuclear study February 2017 showed no infarct or ischemia and her LV function is preserved. She may proceed with surgery without further cardiac evaluation. She is on aspirin and brilinta but her last intervention was to her renal artery and external iliac in February. It has been 4 months. Therefore if necessary her brilinta can be held 5-7 days prior to any procedure. 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 rate 20, height _0  (1.626 m), weight 67.1 kg (148 lb), SpO2 100 %.  General exam: Appears calm and comfortable  Respiratory system: Clear to auscultation. Respiratory effort normal. Cardiovascular system: S1 & S2 heard, RRR. No JVD, murmurs, rubs, gallops or clicks. No pedal edema. Gastrointestinal system: Abdomen is nondistended, soft and nontender. No organomegaly or masses felt. Normal bowel sounds heard. Central nervous system: Alert and oriented. No focal neurological deficits. Extremities: Symmetric 5 x 5 power. Skin: No rashes, lesions or ulcers Psychiatry: Judgement and insight appear normal. Mood & affect  appropriate.      Follow-up Information    Deloria Lair., MD. Call.   Specialty:  Family Medicine Why:  hospital follow in 3-5 days Contact information: [place] [id number] [phone]           Signed: Reyne Dumas 11/30/2016, 10:21 AM        Time spent >1 hour

## 2016-12-01 LAB — HEMOGLOBIN A1C
Hgb A1c MFr Bld: 5.2 % (ref 4.8–5.6)
Mean Plasma Glucose: 103 mg/dL

## 2016-12-02 LAB — CSF CULTURE W GRAM STAIN

## 2016-12-02 LAB — CSF CULTURE: CULTURE: NO GROWTH

## 2016-12-04 ENCOUNTER — Ambulatory Visit (INDEPENDENT_AMBULATORY_CARE_PROVIDER_SITE_OTHER): Payer: BLUE CROSS/BLUE SHIELD | Admitting: Cardiovascular Disease

## 2016-12-04 ENCOUNTER — Encounter: Payer: Self-pay | Admitting: Cardiovascular Disease

## 2016-12-04 VITALS — BP 169/115 | HR 114 | Ht 63.0 in | Wt 150.0 lb

## 2016-12-04 DIAGNOSIS — E782 Mixed hyperlipidemia: Secondary | ICD-10-CM

## 2016-12-04 DIAGNOSIS — Z9289 Personal history of other medical treatment: Secondary | ICD-10-CM

## 2016-12-04 DIAGNOSIS — Z955 Presence of coronary angioplasty implant and graft: Secondary | ICD-10-CM

## 2016-12-04 DIAGNOSIS — I739 Peripheral vascular disease, unspecified: Secondary | ICD-10-CM | POA: Diagnosis not present

## 2016-12-04 DIAGNOSIS — I10 Essential (primary) hypertension: Secondary | ICD-10-CM

## 2016-12-04 DIAGNOSIS — I25118 Atherosclerotic heart disease of native coronary artery with other forms of angina pectoris: Secondary | ICD-10-CM | POA: Diagnosis not present

## 2016-12-04 DIAGNOSIS — I701 Atherosclerosis of renal artery: Secondary | ICD-10-CM | POA: Diagnosis not present

## 2016-12-04 DIAGNOSIS — Z9889 Other specified postprocedural states: Secondary | ICD-10-CM

## 2016-12-04 DIAGNOSIS — Z8679 Personal history of other diseases of the circulatory system: Secondary | ICD-10-CM

## 2016-12-04 MED ORDER — LISINOPRIL 10 MG PO TABS: 10.0000 mg | ORAL_TABLET | Freq: Every day | ORAL | 6 refills | Status: DC

## 2016-12-04 NOTE — Patient Instructions (Addendum)
Medication Instructions:   Increase Lisinopril to 10mg daily.  Continue all other medications.    Labwork: none  Testing/Procedures: none  Follow-Up: Your physician wants you to follow up in: 6 months.  You will receive a reminder letter in the mail one-two months in advance.  If you don't receive a letter, please call our office to schedule the follow up appointment   Any Other Special Instructions Will Be Listed Below (If Applicable).  If you need a refill on your cardiac medications before your next appointment, please call your pharmacy.  

## 2016-12-04 NOTE — Progress Notes (Signed)
SUBJECTIVE: The patient presents for routine follow-up. She has a history of coronary artery disease with prior stenting of the LAD and RCA with drug-eluting stents in late March and early April 2015, respectively.   She has a 37 year history of tobacco abuse but quit in March 2015.   She also has COPD and severe GERD status post Nissen fundoplication.  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  She underwent stenting of the right external iliac artery in February 2018. She also underwent balloon angioplasty of the left renal artery at that time, followed by stent placement.  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  She was recently evaluated by Dr. 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  Decisions are being made whether to proceed with neurosurgical intervention versus craniotomy for biopsy in July.  She underwent a normal nuclear stress test 08/16/15, calculated LVEF 55-65%.   Echocardiogram performed on 11/30/16 demonstrated normal left ventricular systolic function and regional wall motion, LVEF 55-60%, grade 1 diastolic dysfunction with elevated filling pressures, and moderate left atrial enlargement.  Labs 11/29/16: BUN 7, creatinine 1.06.  Lipid panel 11/30/16: Total cholesterol 105, triglycerides 143, HDL 34, LDL 42.  She is very anxious. She says she has chest pain about every 4 days and takes nitroglycerin. She says she has not mentioned this to anyone else. She has some word finding difficulty.  ECG performed in the office today which I personally interpreted showed sinus tachycardia, 112 bpm.   Review of Systems: As per "subjective", otherwise negative.  Allergies  Allergen Reactions  . Bee Venom Anaphylaxis, Hives and Swelling  .  Hydrocodone Other (See Comments)  . Penicillins Other (See Comments)    PATIENT REPORTS "MOTHER TOLD HER SHE HAD ALLERGY TO PCN"  UNKNOWN REACTION TO PCN PCN reaction causing immediate rash, facial/tongue/throat swelling, SOB or lightheadedness with hypotension: unknown PCN reaction causing severe rash involving mucus membranes or skin necrosis: unknown PCN reaction that required hospitalization unknown PCN reaction occurring within the last 10 years: unknown  . Morphine And Related Rash    Current Outpatient Prescriptions  Medication Sig Dispense Refill  . acetaminophen (TYLENOL) 325 MG tablet Take 650 mg by mouth every 6 (six) hours as needed for moderate pain.     Marland Kitchen albuterol (PROVENTIL HFA;VENTOLIN HFA) 108 (90 BASE) MCG/ACT inhaler Inhale 1-2 puffs into the lungs every 4 (four) hours as needed for wheezing or shortness of breath.     Marland Kitchen albuterol (PROVENTIL) (2.5 MG/3ML) 0.083% nebulizer solution Take 2.5 mg by nebulization 2 (two) times daily.     Marland Kitchen ALPRAZolam (XANAX) 0.5 MG tablet Take 0.5 mg by mouth 3 (three) times daily.     Marland Kitchen aspirin 81 MG chewable tablet Chew 81 mg by mouth daily.    Marland Kitchen atorvastatin (LIPITOR) 40 MG tablet take 1 tablet by mouth once daily (Patient taking differently: Take 40 mg by mouth daily) 30 tablet 10  . BRILINTA 60 MG TABS tablet take 1 tablet by mouth twice a day (Patient taking differently: Take 60 mg by mouth twice daily) 60 tablet 3  . cholecalciferol (VITAMIN D) 1000 units tablet Take 1,000 Units by mouth daily.     . citalopram (CELEXA) 20 MG tablet Take 20 mg by mouth daily.   1  . cyclobenzaprine (FLEXERIL) 10 MG  tablet Take 1 tablet (10 mg total) by mouth 3 (three) times daily as needed for muscle spasms. 30 tablet 6  . diclofenac sodium (VOLTAREN) 1 % GEL Apply 4 g topically 4 (four) times daily. 100 g 0  . ferrous gluconate (FERGON) 324 MG tablet Take 324 mg by mouth daily with breakfast.    . folic acid (FOLVITE) 1 MG tablet take 1 tablet by mouth  once daily (Patient taking differently: Take 1 mg by mouth once daily) 30 tablet 6  . isosorbide mononitrate (IMDUR) 60 MG 24 hr tablet Take 1 tablet (60 mg total) by mouth daily. 30 tablet 6  . lisinopril (PRINIVIL,ZESTRIL) 5 MG tablet take 1 tablet by mouth once daily (Patient taking differently: Take 5 mg by mouth once daily) 30 tablet 6  . Menthol, Topical Analgesic, (ICY HOT BACK EX) Apply 1 application topically daily as needed (on back).    . methocarbamol (ROBAXIN) 500 MG tablet Take 1 tablet (500 mg total) by mouth 2 (two) times daily. (Patient not taking: Reported on 11/28/2016) 20 tablet 0  . metoprolol succinate (TOPROL-XL) 50 MG 24 hr tablet take 1 tablet by mouth twice a day (Patient taking differently: Take 50 mg by mouth twice daily) 60 tablet 6  . montelukast (SINGULAIR) 10 MG tablet Take 10 mg by mouth daily.     . nitroGLYCERIN (NITROSTAT) 0.4 MG SL tablet place 1 tablet under the tongue if needed every 5 minutes fo (Patient taking differently: place 0.4 mg under the tongue if needed every 5 minutes for chest pain) 25 tablet 5  . pantoprazole (PROTONIX) 40 MG tablet take 1 tablet by mouth every morning (Patient taking differently: Take 40 mg by mouth in the morning) 30 tablet 6  . traMADol (ULTRAM) 50 MG tablet Take 2 tablets (100 mg total) by mouth every 6 (six) hours as needed for moderate pain. 30 tablet 0   No current facility-administered medications for this visit.    Facility-Administered Medications Ordered in Other Visits  Medication Dose Route Frequency Provider Last Rate Last Dose  . 0.9 %  sodium chloride infusion   Intravenous Continuous Nada Libman, MD      . Chlorhexidine Gluconate Cloth 2 % PADS 6 each  6 each Topical Once Nada Libman, MD        Past Medical History:  Diagnosis Date  . AAA (abdominal aortic aneurysm) (HCC)   . Anxiety    Panic attacks  . Arthritis   . Asthma   . Chronic low back pain    (Old vertebral fracture)  . Complication  of anesthesia   . COPD (chronic obstructive pulmonary disease) (HCC)   . Coronary artery disease 09/08/13   with PCI  . Depression   . Family history of adverse reaction to anesthesia     mother - slow to awaken  . GERD (gastroesophageal reflux disease)   . Heart palpitations    prn toprol for rapid HR  . History of surgery on arm   . HLD (hyperlipidemia)   . HTN (hypertension)   . NSTEMI (non-ST elevated myocardial infarction) (HCC) 09/08/13  . PONV (postoperative nausea and vomiting)   . Renal disorder    ischemic kidney   . Tobacco abuse     Past Surgical History:  Procedure Laterality Date  . 2d echo  12/2012   normal LV with mild LVH, grade 1 diastolic dysfunction  . ABDOMINAL AORTIC ANEURYSM REPAIR  12/02/2015   ABDOMINAL AORTIC ENDOVASCULAR FENESTRATED  STENT GRAFT (N/A)  . ABDOMINAL AORTIC DUPLEX  05/19/2012   WHEN COMPARED TO PRIOR STUDY DATED 06/01/11 THERE IS AN INCREASE IN SIZE OF DILATATION.  Marland Kitchen ABDOMINAL AORTIC ENDOVASCULAR FENESTRATED STENT GRAFT N/A 12/02/2015   Procedure: ABDOMINAL AORTIC ENDOVASCULAR FENESTRATED STENT GRAFT;  Surgeon: Nada Libman, MD;  Location: [facility] OR;  Service: Vascular;  Laterality: N/A;  . ABDOMINAL AORTOGRAM N/A 07/24/2016   Procedure: Abdominal Aortogram;  Surgeon: Nada Libman, MD;  Location: [facility] INVASIVE CV LAB;  Service: Cardiovascular;  Laterality: N/A;  . ABDOMINAL HYSTERECTOMY    . CHOLECYSTECTOMY    . CORONARY ANGIOPLASTY WITH STENT PLACEMENT  09/10/13   PCI of RCA 90% proximal RCA stenosis with DES  . CORONARY STENT PLACEMENT  09/08/13   PCI with cutting balloon, PTCA,DES to prox LAD  . CYST EXCISION Left    wrist  . DOPPLER ECHOCARDIOGRAPHY  12/31/2012  . LAPAROSCOPIC NISSEN FUNDOPLICATION     for severe reflux  . LEFT HEART CATHETERIZATION WITH CORONARY ANGIOGRAM N/A 09/08/2013   Procedure: LEFT HEART CATHETERIZATION WITH CORONARY ANGIOGRAM;  Surgeon: Lennette Bihari, MD;  Location[facility] C[facility] LAB;  Service: Cardiovascular;   Laterality: N/A;  . NUCLEAR STRESS TEST  05/09/2011   NORMAL PATTREN OF PERFUSION IN ALL REGIONS. LV IS NORMAL IS SIZE. EF 68% NO WALL MOTION ABNORMALITIES.  Marland Kitchen PERCUTANEOUS CORONARY STENT INTERVENTION (PCI-S) N/A 09/10/2013   Procedure: PERCUTANEOUS CORONARY STENT INTERVENTION (PCI-S);  Surgeon: Lennette Bihari, MD;  Location: [facility] CATH LAB;  Service: Cardiovascular;  Laterality: N/A;  . PERIPHERAL VASCULAR INTERVENTION Right 07/24/2016   Procedure: Peripheral Vascular Intervention;  Surgeon: Nada Libman, MD;  Location: [facility] INVASIVE CV LAB;  Service: Cardiovascular;  Laterality: Right;   RT RENAL STENT  LT EXT ILIAC STENT  . STENT PLACE LEFT URETER ([facility] HX)      Social History   Social History  . Marital status: Married    Spouse name: N/A  . Number of children: N/A  . Years of education: N/A   Occupational History  . Not on file.   Social History Main Topics  . Smoking status: Former Smoker    Packs/day: 0.50    Years: 37.00    Types: Cigarettes    Quit date: 09/08/2013  . Smokeless tobacco: Never Used  . Alcohol use No  . Drug use: No  . Sexual activity: Not on file   Other Topics Concern  . Not on file   Social History Narrative  . No narrative on file     Vitals:   12/04/16 1620 12/04/16 1622  BP: (!) 163/107 (!) 169/115  Pulse: (!) 118 (!) 114  SpO2: 95% 97%  Weight: 150 lb (68 kg)   Height: 5\' 3"  (1.6 m)     Wt Readings from Last 3 Encounters:  12/04/16 150 lb (68 kg)  11/29/16 148 lb (67.1 kg)  10/24/16 151 lb (68.5 kg)     PHYSICAL EXAM General: NAD, anxious. HEENT: Normal. Neck: No JVD, no thyromegaly. Lungs: Clear to auscultation bilaterally with normal respiratory effort. CV: Nondisplaced PMI.  Tachycardic, regular rhythm, normal S1/S2, no S3/S4, no murmur. No pretibial or periankle edema.   Abdomen: Soft, nontender, no distention.  Neurologic: Alert and oriented. Word finding difficulty (mild).  Psych: Anxious. Skin: Normal. Musculoskeletal:  No gross deformities.    ECG: Most recent ECG reviewed.   Labs: Lab Results  Component Value Date/Time   K 4.0 11/30/2016 06:22 AM   BUN 7 11/30/2016 06:22  AM   CREATININE 0.94 11/30/2016 06:22 AM   CREATININE 0.83 08/18/2015 12:13 PM   ALT 10 (L) 11/30/2016 06:22 AM   TSH 0.70 08/18/2015 12:13 PM   HGB 11.0 (L) 11/30/2016 06:22 AM     Lipids: Lab Results  Component Value Date/Time   LDLCALC 42 11/30/2016 06:22 AM   CHOL 105 11/30/2016 06:22 AM   TRIG 143 11/30/2016 06:22 AM   HDL 34 (L) 11/30/2016 06:22 AM       ASSESSMENT AND PLAN:  1. CAD with LAD and RCA stents with angina: Symptomatically stable overall. Some of her symptoms are likely related to anxiety. Continue aspirin, Brilinta, beta blocker, Imdur, and Lipitor. I will increase lisinopril to control blood pressure. Normal left ventricular systolic function by most recent echocardiogram detailed above.  2. Essential HTN: Markedly elevated. I will increase lisinopril to 10 mg daily.  3. Hyperlipidemia: Most recent lipids reviewed above. Continue Lipitor.  4. AAA s/pEVAR: Followed by vascular surgery.  5. PVD and renal artery stenosis: Most recent interventions performed in February 2018 detailed above. Continue antiplatelet therapy and statin. Followed by vascular surgery.   Disposition: Follow up 6 months.  Prentice Docker, M.D., F.A.C.C.

## 2016-12-12 ENCOUNTER — Other Ambulatory Visit: Payer: Self-pay | Admitting: Cardiovascular Disease

## 2016-12-21 ENCOUNTER — Other Ambulatory Visit: Payer: Self-pay | Admitting: Cardiovascular Disease

## 2016-12-31 ENCOUNTER — Encounter: Payer: Self-pay | Admitting: Surgery

## 2017-01-14 ENCOUNTER — Ambulatory Visit: Payer: BLUE CROSS/BLUE SHIELD | Admitting: Surgery

## 2017-01-14 ENCOUNTER — Inpatient Hospital Stay: Admission: RE | Admit: 2017-01-14 | Payer: BLUE CROSS/BLUE SHIELD | Source: Ambulatory Visit

## 2017-01-15 ENCOUNTER — Other Ambulatory Visit: Payer: BLUE CROSS/BLUE SHIELD

## 2017-01-15 ENCOUNTER — Telehealth: Payer: Self-pay | Admitting: Surgery

## 2017-01-15 NOTE — Telephone Encounter (Signed)
I spoke directly with the patient to reschedule her missed appts from yesterday. She voiced understanding for the appts. Her CTA abd/pelvis appt is scheduled for 02/18/17 at 12:30pm at the [address] [facility]. She is to arrive at 12 noon and no solid foods four hours prior. Medications and liquids are fine. Then she is to see Dr.Brabham at our office at 2:15pm on the same date. I also mailed information to the patient as well. awt

## 2017-01-29 ENCOUNTER — Encounter: Payer: Self-pay | Admitting: Neurology

## 2017-02-06 ENCOUNTER — Encounter: Payer: Self-pay | Admitting: Surgery

## 2017-02-06 ENCOUNTER — Other Ambulatory Visit: Payer: Self-pay | Admitting: Cardiovascular Disease

## 2017-02-18 ENCOUNTER — Encounter: Payer: Self-pay | Admitting: Surgery

## 2017-02-18 ENCOUNTER — Ambulatory Visit
Admission: RE | Admit: 2017-02-18 | Discharge: 2017-02-18 | Disposition: A | Discharge status: Home / Self Care | Payer: BLUE CROSS/BLUE SHIELD | Source: Ambulatory Visit | Attending: Surgery | Admitting: Surgery

## 2017-02-18 ENCOUNTER — Ambulatory Visit (INDEPENDENT_AMBULATORY_CARE_PROVIDER_SITE_OTHER): Payer: BLUE CROSS/BLUE SHIELD | Admitting: Surgery

## 2017-02-18 VITALS — BP 143/90 | HR 88 | Temp 97.6°F | Resp 16 | Ht 64.0 in | Wt 156.0 lb

## 2017-02-18 DIAGNOSIS — I714 Abdominal aortic aneurysm, without rupture, unspecified: Secondary | ICD-10-CM

## 2017-02-18 MED ORDER — IOPAMIDOL (ISOVUE-370) INJECTION 76%
75.0000 mL | Freq: Once | INTRAVENOUS | Status: AC | PRN
  Administered 2017-02-18: 75 mL via INTRAVENOUS

## 2017-02-18 NOTE — Progress Notes (Signed)
[facility] [place]  Patient name: Olivia Wade MRN: [id number] DOB: [birth year] Sex: female   REASON FOR VISIT:    Follow up  HISOTRY OF PRESENT ILLNESS:    Olivia Wade a 66 y.o.femalewho is status post fenestrated endovascular repair of a symptomatic inflammatory juxtarenal abdominal aortic aneurysm on 12/02/2015. She had stenting to bilateral renal arteries and a scallop around her superior mesenteric artery.  The following month she presented with flank and back pain and was found to have an occluded right renal artery stent. She recovered from this and her pain improved. Her overall abdominal pain has also improved.  She began complaining of right hip and thigh pain.  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  Therefore the patient was scheduled for angiography.  This was performed on 07/24/2016.  Contrast was visualized hanging up in the right external iliac artery which was the level of the dissection and therefore this was stented using a 7 x 60 self-expanding stent.  I also identified a 70% stenosis beyond the left renal artery stent.  This was initially treated with balloon angioplasty.  There was a nonflow limiting dissection and therefore a 6 x 12 balloon expandable stent was placed and postdilated with a 7 mm balloon.   Since I last saw her, she had a stroke in June 2018.  This has left her with some right arm weakness and numbness.  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  She is going to have this biopsied during October.  She does not a dorsal claudication symptoms.  PAST MEDICAL HISTORY:   Past Medical History:  Diagnosis Date  . AAA (abdominal aortic aneurysm) (HCC)   . Anxiety    Panic attacks  . Arthritis   . Asthma   . Chronic low back pain    (Old vertebral fracture)  . Complication of anesthesia   . COPD (chronic obstructive  pulmonary disease) (HCC)   . Coronary artery disease 09/08/13   with PCI  . Depression   . Family history of adverse reaction to anesthesia     mother - slow to awaken  . GERD (gastroesophageal reflux disease)   . Heart palpitations    prn toprol for rapid HR  . History of surgery on arm   . HLD (hyperlipidemia)   . HTN (hypertension)   . NSTEMI (non-ST elevated myocardial infarction) (HCC) 09/08/13  . PONV (postoperative nausea and vomiting)   . Renal disorder    ischemic kidney   . Tobacco abuse      FAMILY HISTORY:   Family History  Problem Relation Age of Onset  . Heart attack Father   . Cancer Father        Esophageal  . Heart disease Father        before age 52  . Hypertension Mother   . Heart attack Son 80  . Heart disease Son        before age 38    SOCIAL HISTORY:   Social History  Substance Use Topics  . Smoking status: Former Smoker    Packs/day: 0.50    Years: 37.00    Types: Cigarettes    Quit date: 09/08/2013  . Smokeless tobacco: Never Used  . Alcohol use No     ALLERGIES:   Allergies  Allergen Reactions  . Bee Venom Anaphylaxis, Hives and Swelling  . Hydrocodone Other (See Comments)  .  Penicillins Other (See Comments)    PATIENT REPORTS "MOTHER TOLD HER SHE HAD ALLERGY TO PCN"  UNKNOWN REACTION TO PCN PCN reaction causing immediate rash, facial/tongue/throat swelling, SOB or lightheadedness with hypotension: unknown PCN reaction causing severe rash involving mucus membranes or skin necrosis: unknown PCN reaction that required hospitalization unknown PCN reaction occurring within the last 10 years: unknown  . Morphine And Related Rash     CURRENT MEDICATIONS:   Current Outpatient Prescriptions  Medication Sig Dispense Refill  . acetaminophen (TYLENOL) 325 MG tablet Take 650 mg by mouth every 6 (six) hours as needed for moderate pain.     Marland Kitchen albuterol (PROVENTIL HFA;VENTOLIN HFA) 108 (90 BASE) MCG/ACT inhaler Inhale 1-2 puffs into the  lungs every 4 (four) hours as needed for wheezing or shortness of breath.     Marland Kitchen albuterol (PROVENTIL) (2.5 MG/3ML) 0.083% nebulizer solution Take 2.5 mg by nebulization 2 (two) times daily.     Marland Kitchen ALPRAZolam (XANAX) 0.5 MG tablet Take 0.5 mg by mouth at bedtime.     Marland Kitchen aspirin 81 MG chewable tablet Chew 81 mg by mouth daily.    Marland Kitchen atorvastatin (LIPITOR) 40 MG tablet take 1 tablet by mouth once daily (Patient taking differently: Take 40 mg by mouth daily) 30 tablet 10  . BRILINTA 60 MG TABS tablet take 1 tablet by mouth twice a day (Patient taking differently: Take 60 mg by mouth twice daily) 60 tablet 3  . cholecalciferol (VITAMIN D) 1000 units tablet Take 1,000 Units by mouth daily.     . citalopram (CELEXA) 20 MG tablet Take 20 mg by mouth daily.   1  . cyclobenzaprine (FLEXERIL) 10 MG tablet Take 1 tablet (10 mg total) by mouth 3 (three) times daily as needed for muscle spasms. 30 tablet 6  . diclofenac sodium (VOLTAREN) 1 % GEL Apply 4 g topically 4 (four) times daily. 100 g 0  . ferrous gluconate (FERGON) 324 MG tablet Take 324 mg by mouth daily with breakfast.    . folic acid (FOLVITE) 1 MG tablet take 1 tablet by mouth once daily 30 tablet 6  . isosorbide mononitrate (IMDUR) 60 MG 24 hr tablet take 1 tablet by mouth once daily 30 tablet 6  . lisinopril (PRINIVIL,ZESTRIL) 10 MG tablet Take 1 tablet (10 mg total) by mouth daily. 30 tablet 6  . Menthol, Topical Analgesic, (ICY HOT BACK EX) Apply 1 application topically daily as needed (on back).    . metoprolol succinate (TOPROL-XL) 50 MG 24 hr tablet take 1 tablet by mouth twice a day 60 tablet 6  . montelukast (SINGULAIR) 10 MG tablet Take 10 mg by mouth daily.     . nitroGLYCERIN (NITROSTAT) 0.4 MG SL tablet place 1 tablet under the tongue if needed every 5 minutes fo (Patient taking differently: place 0.4 mg under the tongue if needed every 5 minutes for chest pain) 25 tablet 5  . pantoprazole (PROTONIX) 40 MG tablet take 1 tablet by mouth  every morning 30 tablet 6  . traMADol (ULTRAM) 50 MG tablet Take 2 tablets (100 mg total) by mouth every 6 (six) hours as needed for moderate pain. 30 tablet 0  . lisinopril (PRINIVIL,ZESTRIL) 20 MG tablet Take 20 mg by mouth daily.  0  . lisinopril (PRINIVIL,ZESTRIL) 5 MG tablet   0  . methocarbamol (ROBAXIN) 500 MG tablet Take 1 tablet (500 mg total) by mouth 2 (two) times daily. (Patient not taking: Reported on 11/28/2016) 20 tablet 0   No  current facility-administered medications for this visit.    Facility-Administered Medications Ordered in Other Visits  Medication Dose Route Frequency Provider Last Rate Last Dose  . 0.9 %  sodium chloride infusion   Intravenous Continuous Nada Libman, MD      . Chlorhexidine Gluconate Cloth 2 % PADS 6 each  6 each Topical Once Nada Libman, MD        REVIEW OF SYSTEMS:    denotes positive finding,  denotes negative finding Cardiac  Comments:  Chest pain or chest pressure:    Shortness of breath upon exertion:    Short of breath when lying flat:    Irregular heart rhythm:        Vascular    Pain in calf, thigh, or hip brought on by ambulation:    Pain in feet at night that wakes you up from your sleep:     Blood clot in your veins:    Leg swelling:         Pulmonary    Oxygen at home:    Productive cough:     Wheezing:         Neurologic    Sudden weakness in arms or legs:  x   Sudden numbness in arms or legs:  x   Sudden onset of difficulty speaking or slurred speech:    Temporary loss of vision in one eye:     Problems with dizziness:         Gastrointestinal    Blood in stool:     Vomited blood:         Genitourinary    Burning when urinating:     Blood in urine:        Psychiatric    Major depression:         Hematologic    Bleeding problems:    Problems with blood clotting too easily:        Skin    Rashes or ulcers:        Constitutional    Fever or chills:      PHYSICAL EXAM:   Vitals:    02/18/17 1336 02/18/17 1337  BP: (!) 144/95 (!) 143/90  Pulse: 88 88  Resp: 16   Temp: 97.6 F (36.4 C)   SpO2: 94%   Weight: 156 lb (70.8 kg)   Height:  (1.626 m)     GENERAL: The patient is a well-nourished female, in no acute distress. The vital signs are documented above. CARDIAC: There is a regular rate and rhythm.  PULMONARY: Non-labored respirations ABDOMEN: Soft and non-tender  MUSCULOSKELETAL: There are no major deformities or cyanosis. NEUROLOGIC: No focal weakness or paresthesias are detected. SKIN: There are no ulcers or rashes noted. PSYCHIATRIC: The patient has a normal affect.  STUDIES:   I have reviewed her CT scan which has not formally been read by radiology.  The stent graft remains in good position.  I did not see any endoleak.  The left renal stent appears widely patent as is the iliac stent.  MEDICAL ISSUES:   Status post fenestrated endovascular aneurysm repair: The patient appears to be doing very well from this perspective.  Her CT scan remains stable.  I will have her follow-up in one year with a repeat CT scan.    [name], MD [facility] [phone] Pager [phone]

## 2017-02-27 ENCOUNTER — Other Ambulatory Visit: Payer: Self-pay | Admitting: Cardiovascular Disease

## 2017-03-15 ENCOUNTER — Ambulatory Visit (INDEPENDENT_AMBULATORY_CARE_PROVIDER_SITE_OTHER): Payer: BLUE CROSS/BLUE SHIELD | Admitting: Neurology

## 2017-03-15 ENCOUNTER — Encounter: Payer: Self-pay | Admitting: Neurology

## 2017-03-15 VITALS — BP 122/78 | HR 70 | Ht 64.0 in | Wt 157.0 lb

## 2017-03-15 DIAGNOSIS — G43009 Migraine without aura, not intractable, without status migrainosus: Secondary | ICD-10-CM

## 2017-03-15 DIAGNOSIS — R55 Syncope and collapse: Secondary | ICD-10-CM

## 2017-03-15 DIAGNOSIS — R9089 Other abnormal findings on diagnostic imaging of central nervous system: Secondary | ICD-10-CM

## 2017-03-15 DIAGNOSIS — I639 Cerebral infarction, unspecified: Secondary | ICD-10-CM | POA: Diagnosis not present

## 2017-03-15 NOTE — Patient Instructions (Addendum)
1.  We will repeat MRI of brain with and without contrast  We have sent a referral to [facility] for your MRI and they will call you directly to schedule your appt. They are located at [address] [facility]. If you need to contact them directly please call [phone].   2.  We will check EEG 3.  After I review the EEG, we will start a daily medication for migraines 4.  Continue aspirin 81mg  daily and atorvastatin for cholesterol 5.  Follow up in 4 months.

## 2017-03-15 NOTE — Progress Notes (Signed)
NEUROLOGY CONSULTATION NOTE  Olivia Wade MRN: [id number] DOB: May 31, 1951  Referring provider: Dr. Sherryll Burger Primary care provider: Dr. Sherryll Burger  Reason for consult:  Stroke  HISTORY OF PRESENT ILLNESS: Olivia Wade is a 66 year old right-handed female with hypertension, CAD, COPD, asthma and AAA who presents for stroke and brain mass.  She is accompanied by her husband who supplements history.  History supplemented by hospital notes.   She was admitted to [facility] from 11/28/16 to 11/30/16 after being found on the floor in the bathroom by her husband.  The fall was unwitnessed and it is uncertain if she passed out.  She was awake at the time.  She did not bite her tongue or have incontinence.  She does not remember what happened.  She was helped back to bed where she started mumbling and exhibit shaking.  She had headache, confusion, visual disturbance in her left eye.   She was evaluated at [facility] where CT of head showed a hypodense area in the left occipital lobe.  She was transferred to [facility] where MRI of brain with and without contrast was performed, which was personally reviewed and revealed a small area of ischemia in the left medial frontal gyrus and vasogenic edema in the left occipital lobe concerning for tumor.  She underwent lumbar puncture to evaluate for an inflammatory process, where CSF analysis revealed cell count 1, glucose 59, protein 65.  Neurosurgery was consulted, who believed that the vasogenic edema was likely cerebrovascular and not neoplastic.  For stroke workup, TTE demonstrated EF 55-60% with no cardiac source of emboli.  CTA of head and neck revealed mild atherosclerotic changes but no significant stenosis or occlusion.  LDL was 42.  Telemetry revealed normal sinus rhythm.  She was slightly orthostatic with 22 mm drop in systolic blood pressure.  She is on ASA  daily and atorvastatin .  Since the hospitalization, she has had worsening migraines.   She has longstanding history of migraines described as 8-9/10 holocephalic squeezing pain associated with blurred vision, nausea, photophobia and phonophobia, lasting anywhere from 2 hours to all day and occurring once a week.  She denies specific trigger.  Excedrin typically relieves it, but she was told not to take it.  She has been taking Tylenol. She reports occasional dizziness.  She denies subsequent episodes of black outs, confusion, visual disturbance or unilateral numbness or weakness.  11/30/16 CMP:  Na 138, K 4, Cl 109, CO2 20, glucose 96, BUN 7, Cr 0.94, t. Bili 0.8, ALP 77, AST 17 and ALT 10.  PAST MEDICAL HISTORY: Past Medical History:  Diagnosis Date  . AAA (abdominal aortic aneurysm) (HCC)   . Anxiety    Panic attacks  . Arthritis   . Asthma   . Chronic low back pain    (Old vertebral fracture)  . Complication of anesthesia   . COPD (chronic obstructive pulmonary disease) (HCC)   . Coronary artery disease 09/08/13   with PCI  . Depression   . Family history of adverse reaction to anesthesia     mother - slow to awaken  . GERD (gastroesophageal reflux disease)   . Heart palpitations    prn toprol for rapid HR  . History of surgery on arm   . HLD (hyperlipidemia)   . HTN (hypertension)   . NSTEMI (non-ST elevated myocardial infarction) (HCC) 09/08/13  . PONV (postoperative nausea and vomiting)   . Renal disorder    ischemic kidney   .  Tobacco abuse     PAST SURGICAL HISTORY: Past Surgical History:  Procedure Laterality Date  . 2d echo  12/2012   normal LV with mild LVH, grade 1 diastolic dysfunction  . ABDOMINAL AORTIC ANEURYSM REPAIR  12/02/2015   ABDOMINAL AORTIC ENDOVASCULAR FENESTRATED STENT GRAFT (N/A)  . ABDOMINAL AORTIC DUPLEX  05/19/2012   WHEN COMPARED TO PRIOR STUDY DATED 06/01/11 THERE IS AN INCREASE IN SIZE OF DILATATION.  Marland Kitchen ABDOMINAL AORTIC ENDOVASCULAR FENESTRATED STENT GRAFT N/A 12/02/2015   Procedure: ABDOMINAL AORTIC ENDOVASCULAR FENESTRATED STENT  GRAFT;  Surgeon: Nada Libman, MD;  Location: [facility] OR;  Service: Vascular;  Laterality: N/A;  . ABDOMINAL AORTOGRAM N/A 07/24/2016   Procedure: Abdominal Aortogram;  Surgeon: Nada Libman, MD;  Location: [facility] INVASIVE CV LAB;  Service: Cardiovascular;  Laterality: N/A;  . ABDOMINAL HYSTERECTOMY    . CHOLECYSTECTOMY    . CORONARY ANGIOPLASTY WITH STENT PLACEMENT  09/10/13   PCI of RCA 90% proximal RCA stenosis with DES  . CORONARY STENT PLACEMENT  09/08/13   PCI with cutting balloon, PTCA,DES to prox LAD  . CYST EXCISION Left    wrist  . DOPPLER ECHOCARDIOGRAPHY  12/31/2012  . LAPAROSCOPIC NISSEN FUNDOPLICATION     for severe reflux  . LEFT HEART CATHETERIZATION WITH CORONARY ANGIOGRAM N/A 09/08/2013   Procedure: LEFT HEART CATHETERIZATION WITH CORONARY ANGIOGRAM;  Surgeon: Lennette Bihari, MD;  Location: [facility] CATH LAB;  Service: Cardiovascular;  Laterality: N/A;  . NUCLEAR STRESS TEST  05/09/2011   NORMAL PATTREN OF PERFUSION IN ALL REGIONS. LV IS NORMAL IS SIZE. EF 68% NO WALL MOTION ABNORMALITIES.  Marland Kitchen PERCUTANEOUS CORONARY STENT INTERVENTION (PCI-S) N/A 09/10/2013   Procedure: PERCUTANEOUS CORONARY STENT INTERVENTION (PCI-S);  Surgeon: Lennette Bihari, MD;  Location: [facility] CATH LAB;  Service: Cardiovascular;  Laterality: N/A;  . PERIPHERAL VASCULAR INTERVENTION Right 07/24/2016   Procedure: Peripheral Vascular Intervention;  Surgeon: Nada Libman, MD;  Location: [facility] INVASIVE CV LAB;  Service: Cardiovascular;  Laterality: Right;   RT RENAL STENT  LT EXT ILIAC STENT  . STENT PLACE LEFT URETER ([facility] HX)      MEDICATIONS: Current Outpatient Prescriptions on File Prior to Visit  Medication Sig Dispense Refill  . acetaminophen (TYLENOL) 325 MG tablet Take 650 mg by mouth every 6 (six) hours as needed for moderate pain.     Marland Kitchen albuterol (PROVENTIL HFA;VENTOLIN HFA) 108 (90 BASE) MCG/ACT inhaler Inhale 1-2 puffs into the lungs every 4 (four) hours as needed for wheezing or shortness of breath.     Marland Kitchen  albuterol (PROVENTIL) (2.5 MG/3ML) 0.083% nebulizer solution Take 2.5 mg by nebulization 2 (two) times daily.     Marland Kitchen ALPRAZolam (XANAX) 0.5 MG tablet Take 0.5 mg by mouth at bedtime.     Marland Kitchen aspirin 81 MG chewable tablet Chew 81 mg by mouth daily.    Marland Kitchen atorvastatin (LIPITOR) 40 MG tablet take 1 tablet by mouth once daily (Patient taking differently: Take 40 mg by mouth daily) 30 tablet 10  . BRILINTA 60 MG TABS tablet take 1 tablet by mouth twice a day 60 tablet 3  . cholecalciferol (VITAMIN D) 1000 units tablet Take 1,000 Units by mouth daily.     . citalopram (CELEXA) 20 MG tablet Take 20 mg by mouth daily.   1  . cyclobenzaprine (FLEXERIL) 10 MG tablet Take 1 tablet (10 mg total) by mouth 3 (three) times daily as needed for muscle spasms. 30 tablet 6  . ferrous gluconate (FERGON) 324 MG  tablet Take 324 mg by mouth daily with breakfast.    . folic acid (FOLVITE) 1 MG tablet take 1 tablet by mouth once daily 30 tablet 6  . isosorbide mononitrate (IMDUR) 60 MG 24 hr tablet take 1 tablet by mouth once daily 30 tablet 6  . lisinopril (PRINIVIL,ZESTRIL) 20 MG tablet Take 20 mg by mouth daily.  0  . Menthol, Topical Analgesic, (ICY HOT BACK EX) Apply 1 application topically daily as needed (on back).    . methocarbamol (ROBAXIN) 500 MG tablet Take 1 tablet (500 mg total) by mouth 2 (two) times daily. 20 tablet 0  . metoprolol succinate (TOPROL-XL) 50 MG 24 hr tablet take 1 tablet by mouth twice a day 60 tablet 6  . montelukast (SINGULAIR) 10 MG tablet Take 10 mg by mouth daily.     . nitroGLYCERIN (NITROSTAT) 0.4 MG SL tablet place 1 tablet under the tongue if needed every 5 minutes fo (Patient taking differently: place 0.4 mg under the tongue if needed every 5 minutes for chest pain) 25 tablet 5  . pantoprazole (PROTONIX) 40 MG tablet take 1 tablet by mouth every morning 30 tablet 6  . traMADol (ULTRAM) 50 MG tablet Take 2 tablets (100 mg total) by mouth every 6 (six) hours as needed for moderate pain.  30 tablet 0   Current Facility-Administered Medications on File Prior to Visit  Medication Dose Route Frequency Provider Last Rate Last Dose  . 0.9 %  sodium chloride infusion   Intravenous Continuous Nada Libman, MD      . Chlorhexidine Gluconate Cloth 2 % PADS 6 each  6 each Topical Once Nada Libman, MD        ALLERGIES: Allergies  Allergen Reactions  . Bee Venom Anaphylaxis, Hives and Swelling  . Hydrocodone Other (See Comments)  . Penicillins Other (See Comments)    PATIENT REPORTS "MOTHER TOLD HER SHE HAD ALLERGY TO PCN"  UNKNOWN REACTION TO PCN PCN reaction causing immediate rash, facial/tongue/throat swelling, SOB or lightheadedness with hypotension: unknown PCN reaction causing severe rash involving mucus membranes or skin necrosis: unknown PCN reaction that required hospitalization unknown PCN reaction occurring within the last 10 years: unknown  . Morphine And Related Rash    FAMILY HISTORY: Family History  Problem Relation Age of Onset  . Heart attack Father   . Cancer Father        Esophageal  . Heart disease Father        before age 70  . Hypertension Mother   . Heart attack Son 16  . Heart disease Son        before age 29    SOCIAL HISTORY: Social History   Social History  . Marital status: Married    Spouse name: N/A  . Number of children: N/A  . Years of education: N/A   Occupational History  . Not on file.   Social History Main Topics  . Smoking status: Former Smoker    Packs/day: 0.50    Years: 37.00    Types: Cigarettes    Quit date: 09/08/2013  . Smokeless tobacco: Never Used  . Alcohol use No  . Drug use: No  . Sexual activity: Not on file   Other Topics Concern  . Not on file   Social History Narrative  . No narrative on file    REVIEW OF SYSTEMS: Constitutional: No fevers, chills, or sweats, no generalized fatigue, change in appetite Eyes: No visual changes, double vision, eye  pain Ear, nose and throat: No hearing  loss, ear pain, nasal congestion, sore throat Cardiovascular: No chest pain, palpitations Respiratory:  No shortness of breath at rest or with exertion, wheezes GastrointestinaI: No nausea, vomiting, diarrhea, abdominal pain, fecal incontinence Genitourinary:  No dysuria, urinary retention or frequency Musculoskeletal:  No neck pain, back pain Integumentary: No rash, pruritus, skin lesions Neurological: as above Psychiatric: No depression, insomnia, anxiety Endocrine: No palpitations, fatigue, diaphoresis, mood swings, change in appetite, change in weight, increased thirst Hematologic/Lymphatic:  No purpura, petechiae. Allergic/Immunologic: no itchy/runny eyes, nasal congestion, recent allergic reactions, rashes  PHYSICAL EXAM: Vitals:   03/15/17 1256  BP: 122/78  Pulse: 70  SpO2: 98%   General: No acute distress.   Head:  Normocephalic/atraumatic Eyes:  fundi examined but not visualized Neck: supple, no paraspinal tenderness, full range of motion Back: No paraspinal tenderness Heart: regular rate and rhythm Lungs: Clear to auscultation bilaterally. Vascular: No carotid bruits. Neurological Exam: Mental status: alert and oriented to person, place, and time, recent and remote memory intact, fund of knowledge intact, attention and concentration intact, speech fluent and not dysarthric, language intact. Cranial nerves: CN I: not tested CN II: pupils equal, round and reactive to light, visual fields intact CN III, IV, VI:  full range of motion, no nystagmus, no ptosis CN V: facial sensation intact CN VII: upper and lower face symmetric CN VIII: hearing intact CN IX, X: gag intact, uvula midline CN XI: sternocleidomastoid and trapezius muscles intact CN XII: tongue midline Bulk & Tone: normal, no fasciculations. Motor:  5/5 throughout  Sensation: temperature and vibration sensation intact. Deep Tendon Reflexes:  2+ throughout, toes downgoing.  Finger to nose testing:  Without  dysmetria.  Heel to shin:  Without dysmetria.  Gait:  Wide-based.  Able to turn but not tandem walk. Romberg negative.  IMPRESSION: Cerebrovascular accident Abnormality on brain MRI, mass lesion versus ischemic stroke Syncope and collapse Migraines  PLAN: 1.  We will repeat MRI of brain with and without contrast.  If findings suggest tumor, will refer to neuro-oncology, Dr. Celene Kras. 2.  We will check EEG to evaluate for epileptiform discharges 3.  After I review the EEG, we will probably start topiramate  at bedtime for migraine prophylaxis 4.  Continue blood pressure control, aspirin  daily and atorvastatin (LDL at goal of less than 70) 5.  Follow up in 4 months.    Thank you for allowing me to take part in the care of this patient.  Shon Millet, DO  CC:  Kirstie Peri, MD

## 2017-03-24 ENCOUNTER — Inpatient Hospital Stay
Admission: AD | Admit: 2017-03-24 | Payer: Self-pay | Source: Other Acute Inpatient Hospital | Admitting: Internal Medicine

## 2017-03-25 ENCOUNTER — Other Ambulatory Visit: Payer: BLUE CROSS/BLUE SHIELD

## 2017-04-11 DEATH — deceased

## 2017-04-17 ENCOUNTER — Other Ambulatory Visit: Payer: BLUE CROSS/BLUE SHIELD

## 2017-06-05 ENCOUNTER — Ambulatory Visit: Payer: BLUE CROSS/BLUE SHIELD | Admitting: Cardiovascular Disease

## 2017-07-16 ENCOUNTER — Ambulatory Visit: Payer: BLUE CROSS/BLUE SHIELD | Admitting: Neurology
